# Patient Record
Sex: Female | Born: 1965
Health system: Southern US, Community
[De-identification: ages and names within clinical notes are randomized; demographics above are authoritative.]

## PROBLEM LIST (undated history)

## (undated) DIAGNOSIS — Z794 Long term (current) use of insulin: Secondary | ICD-10-CM

## (undated) DIAGNOSIS — K589 Irritable bowel syndrome without diarrhea: Secondary | ICD-10-CM

## (undated) DIAGNOSIS — E119 Type 2 diabetes mellitus without complications: Secondary | ICD-10-CM

## (undated) DIAGNOSIS — K3580 Unspecified acute appendicitis: Secondary | ICD-10-CM

## (undated) DIAGNOSIS — M5030 Other cervical disc degeneration, unspecified cervical region: Secondary | ICD-10-CM

## (undated) DIAGNOSIS — M549 Dorsalgia, unspecified: Secondary | ICD-10-CM

## (undated) DIAGNOSIS — E079 Disorder of thyroid, unspecified: Secondary | ICD-10-CM

## (undated) DIAGNOSIS — E785 Hyperlipidemia, unspecified: Secondary | ICD-10-CM

## (undated) DIAGNOSIS — Z8719 Personal history of other diseases of the digestive system: Secondary | ICD-10-CM

## (undated) DIAGNOSIS — K219 Gastro-esophageal reflux disease without esophagitis: Secondary | ICD-10-CM

## (undated) DIAGNOSIS — I10 Essential (primary) hypertension: Secondary | ICD-10-CM

## (undated) DIAGNOSIS — G4730 Sleep apnea, unspecified: Secondary | ICD-10-CM

## (undated) DIAGNOSIS — G8929 Other chronic pain: Secondary | ICD-10-CM

## (undated) DIAGNOSIS — E0590 Thyrotoxicosis, unspecified without thyrotoxic crisis or storm: Secondary | ICD-10-CM

## (undated) DIAGNOSIS — F419 Anxiety disorder, unspecified: Secondary | ICD-10-CM

## (undated) DIAGNOSIS — D649 Anemia, unspecified: Secondary | ICD-10-CM

## (undated) DIAGNOSIS — Z6841 Body Mass Index (BMI) 40.0 and over, adult: Secondary | ICD-10-CM

## (undated) DIAGNOSIS — M1990 Unspecified osteoarthritis, unspecified site: Secondary | ICD-10-CM

## (undated) HISTORY — PX: VITRECTOMY: SHX106

## (undated) HISTORY — DX: Disorder of thyroid, unspecified: E07.9

## (undated) HISTORY — DX: Anxiety disorder, unspecified: F41.9

## (undated) HISTORY — DX: Thyrotoxicosis, unspecified without thyrotoxic crisis or storm: E05.90

## (undated) HISTORY — DX: Irritable bowel syndrome, unspecified: K58.9

## (undated) HISTORY — DX: Unspecified osteoarthritis, unspecified site: M19.90

## (undated) HISTORY — DX: Hyperlipidemia, unspecified: E78.5

## (undated) HISTORY — PX: TUBAL LIGATION: SHX77

---

## 1999-02-17 ENCOUNTER — Other Ambulatory Visit: Admission: RE | Admit: 1999-02-17 | Discharge: 1999-02-17 | Payer: Self-pay | Admitting: Obstetrics and Gynecology

## 1999-02-17 ENCOUNTER — Encounter (INDEPENDENT_AMBULATORY_CARE_PROVIDER_SITE_OTHER): Payer: Self-pay | Admitting: Specialist

## 2000-08-06 ENCOUNTER — Encounter: Payer: Self-pay | Admitting: Emergency Medicine

## 2000-08-06 ENCOUNTER — Emergency Department (HOSPITAL_COMMUNITY): Admission: EM | Admit: 2000-08-06 | Discharge: 2000-08-06 | Payer: Self-pay

## 2000-09-14 ENCOUNTER — Other Ambulatory Visit: Admission: RE | Admit: 2000-09-14 | Discharge: 2000-09-14 | Payer: Self-pay | Admitting: Obstetrics and Gynecology

## 2000-12-28 ENCOUNTER — Encounter: Admission: RE | Admit: 2000-12-28 | Discharge: 2001-03-28 | Payer: Self-pay | Admitting: Internal Medicine

## 2001-09-28 ENCOUNTER — Encounter: Admission: RE | Admit: 2001-09-28 | Discharge: 2001-12-27 | Payer: Self-pay | Admitting: Internal Medicine

## 2003-05-17 ENCOUNTER — Other Ambulatory Visit: Admission: RE | Admit: 2003-05-17 | Discharge: 2003-05-17 | Payer: Self-pay | Admitting: Obstetrics and Gynecology

## 2004-08-26 ENCOUNTER — Other Ambulatory Visit: Admission: RE | Admit: 2004-08-26 | Discharge: 2004-08-26 | Payer: Self-pay | Admitting: Obstetrics and Gynecology

## 2005-07-14 ENCOUNTER — Encounter: Admission: RE | Admit: 2005-07-14 | Discharge: 2005-10-12 | Payer: Self-pay | Admitting: Internal Medicine

## 2006-03-24 ENCOUNTER — Encounter: Admission: RE | Admit: 2006-03-24 | Discharge: 2006-03-24 | Payer: Self-pay | Admitting: Endocrinology

## 2006-04-06 ENCOUNTER — Encounter: Admission: RE | Admit: 2006-04-06 | Discharge: 2006-04-06 | Payer: Self-pay | Admitting: Endocrinology

## 2006-04-14 ENCOUNTER — Encounter (INDEPENDENT_AMBULATORY_CARE_PROVIDER_SITE_OTHER): Payer: Self-pay | Admitting: Specialist

## 2006-04-14 ENCOUNTER — Other Ambulatory Visit: Admission: RE | Admit: 2006-04-14 | Discharge: 2006-04-14 | Payer: Self-pay | Admitting: Interventional Radiology

## 2006-04-14 ENCOUNTER — Encounter: Admission: RE | Admit: 2006-04-14 | Discharge: 2006-04-14 | Payer: Self-pay | Admitting: Endocrinology

## 2007-03-09 ENCOUNTER — Ambulatory Visit (HOSPITAL_COMMUNITY): Admission: RE | Admit: 2007-03-09 | Discharge: 2007-03-09 | Payer: Self-pay | Admitting: Gastroenterology

## 2007-03-16 ENCOUNTER — Encounter: Admission: RE | Admit: 2007-03-16 | Discharge: 2007-03-16 | Payer: Self-pay | Admitting: Gastroenterology

## 2007-08-10 ENCOUNTER — Encounter: Admission: RE | Admit: 2007-08-10 | Discharge: 2007-08-10 | Payer: Self-pay | Admitting: Gastroenterology

## 2007-08-24 ENCOUNTER — Encounter: Payer: Self-pay | Admitting: Gastroenterology

## 2007-08-24 ENCOUNTER — Ambulatory Visit (HOSPITAL_COMMUNITY): Admission: RE | Admit: 2007-08-24 | Discharge: 2007-08-24 | Payer: Self-pay | Admitting: Gastroenterology

## 2007-08-30 ENCOUNTER — Ambulatory Visit: Payer: Self-pay | Admitting: Thoracic Surgery

## 2007-08-31 ENCOUNTER — Ambulatory Visit: Payer: Self-pay | Admitting: Gastroenterology

## 2007-10-05 ENCOUNTER — Encounter: Admission: RE | Admit: 2007-10-05 | Discharge: 2007-10-05 | Payer: Self-pay | Admitting: Endocrinology

## 2008-02-26 ENCOUNTER — Encounter: Payer: Self-pay | Admitting: Gastroenterology

## 2008-02-26 DIAGNOSIS — D379 Neoplasm of uncertain behavior of digestive organ, unspecified: Secondary | ICD-10-CM | POA: Insufficient documentation

## 2008-03-13 ENCOUNTER — Telehealth: Payer: Self-pay | Admitting: Gastroenterology

## 2008-03-21 ENCOUNTER — Ambulatory Visit: Payer: Self-pay | Admitting: Gastroenterology

## 2008-03-21 ENCOUNTER — Ambulatory Visit (HOSPITAL_COMMUNITY): Admission: RE | Admit: 2008-03-21 | Discharge: 2008-03-21 | Payer: Self-pay | Admitting: Gastroenterology

## 2008-03-21 ENCOUNTER — Encounter: Payer: Self-pay | Admitting: Gastroenterology

## 2008-03-25 ENCOUNTER — Encounter: Payer: Self-pay | Admitting: Gastroenterology

## 2008-06-14 ENCOUNTER — Encounter: Admission: RE | Admit: 2008-06-14 | Discharge: 2008-06-14 | Payer: Self-pay | Admitting: Endocrinology

## 2009-01-28 IMAGING — CT CT NECK W/ CM
3 series · 16 of 33 positions shown, 19 images · IV contrast ([ID] [id number])
Comparison: none

CLINICAL DATA: Follow-up substernal goiter/mediastinal mass.

CT NECK WITH CONTRAST
TECHNIQUE: Multidetector CT imaging of the neck was performed with
intravenous contrast.
Contrast: 100 ml Zmnipaque-XHH intravenous.
ultrasound 03/24/2006, nuclear medicine thyroid scans 04/07/2006
and 10/06/2007, and CT chest 08/10/2007 and CT chest 06/14/2008.

[Series 103: chest and neck · axial · 0.40mm/px · z∈[-266,-86]mm · 8 of 58 slices shown, 10 images]
[im 5/58  soft-tissue]
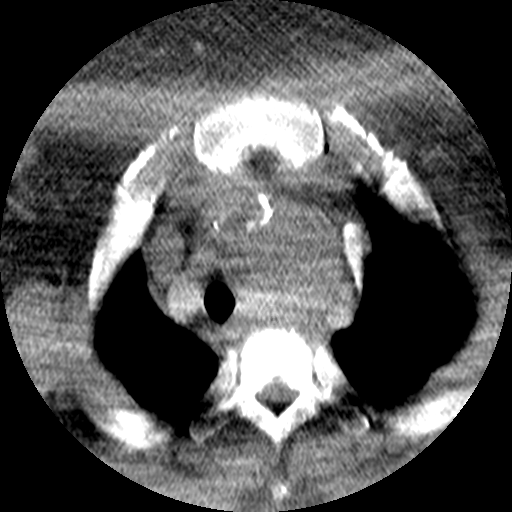
[im 5/58  bone]
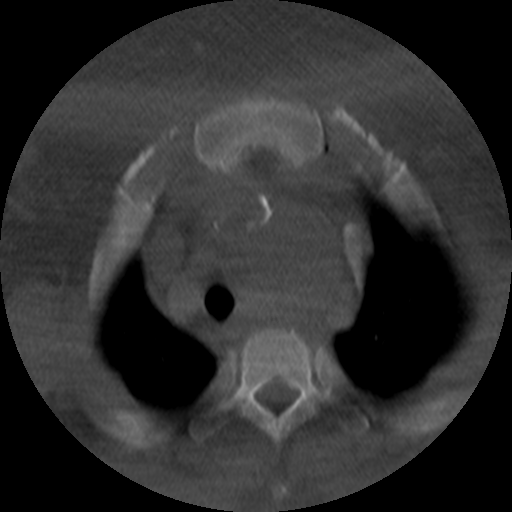
[im 14/58  bone]
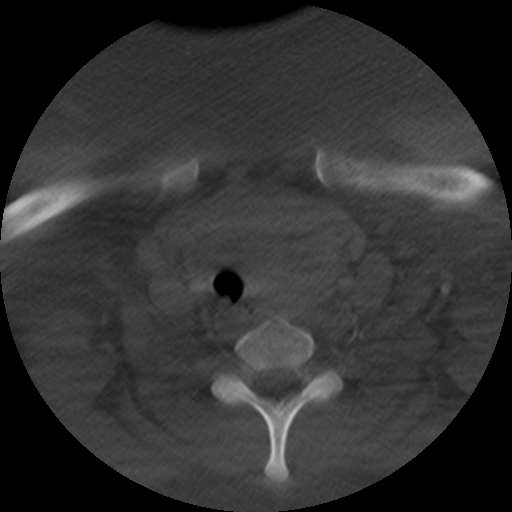
[im 18/58  bone]
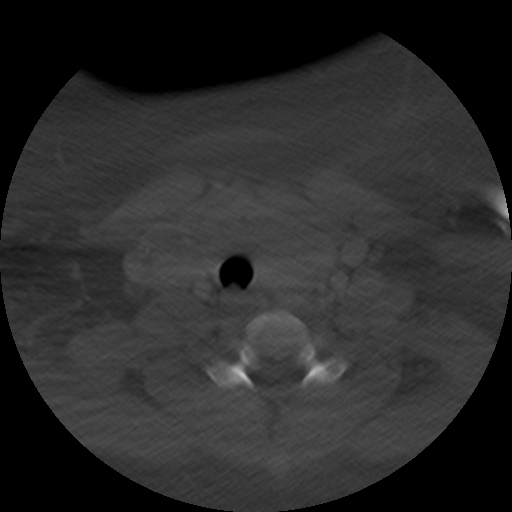
[im 27/58  bone]
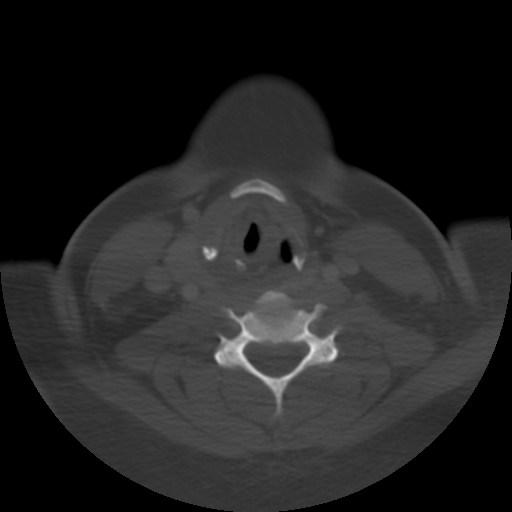
[im 31/58  soft-tissue]
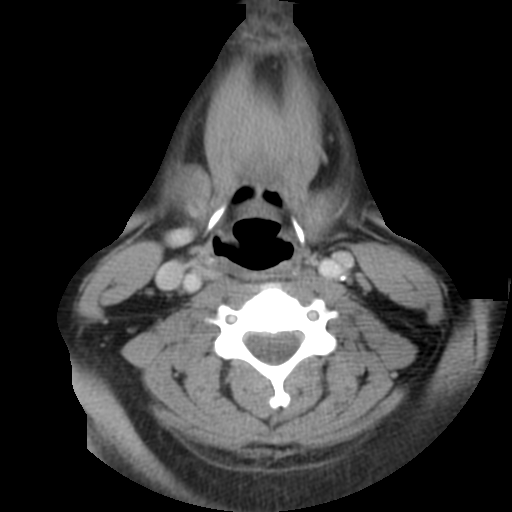
[im 31/58  bone]
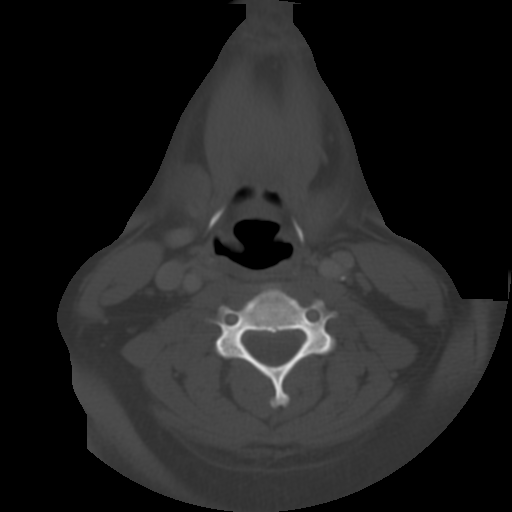
[im 40/58  bone]
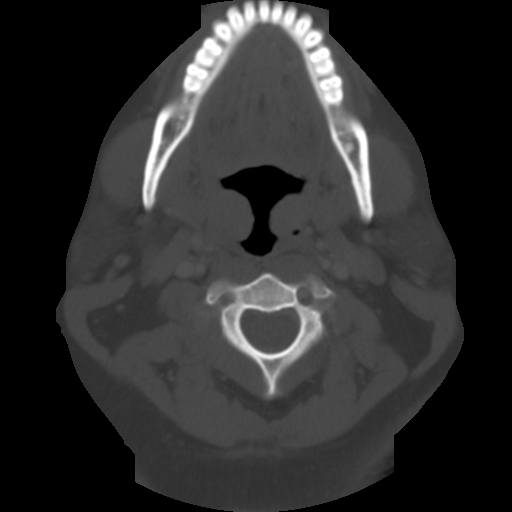
[im 44/58  bone]
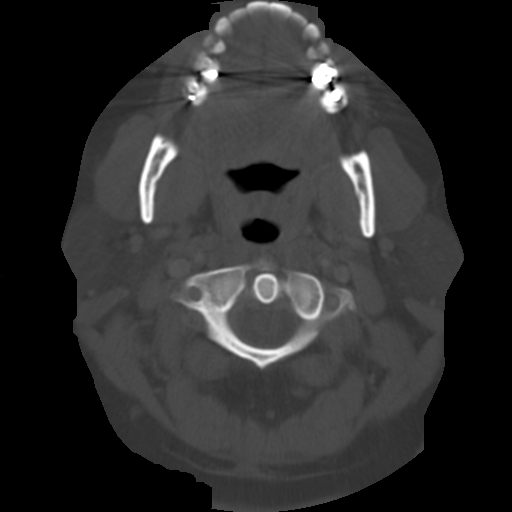
[im 53/58  bone]
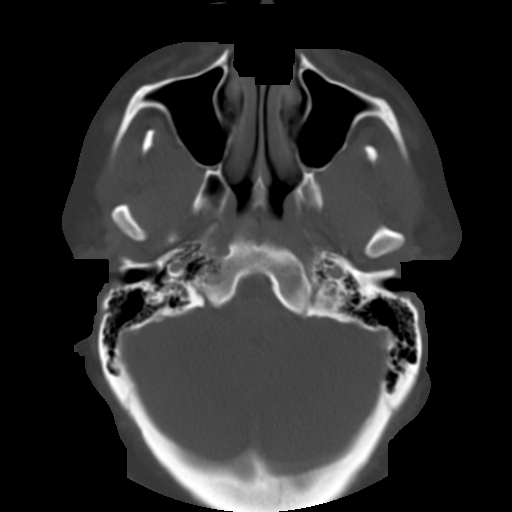

[Series 104: cor neck · coronal · 0.43mm/px · 3 of 105 slices shown]
[im 21/105  bone]
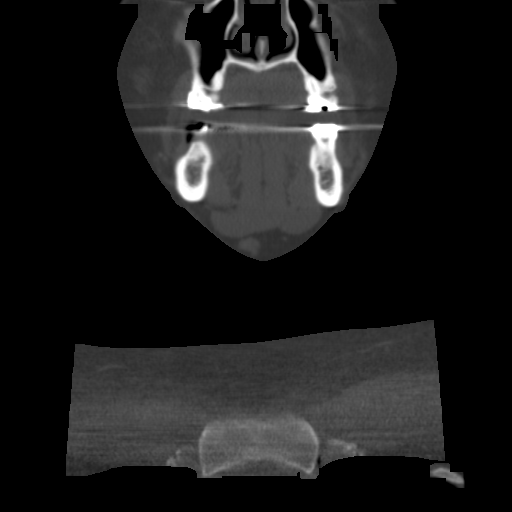
[im 42/105  bone]
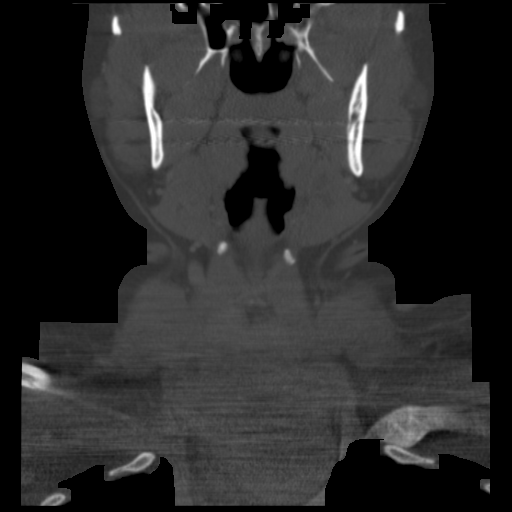
[im 63/105  bone]
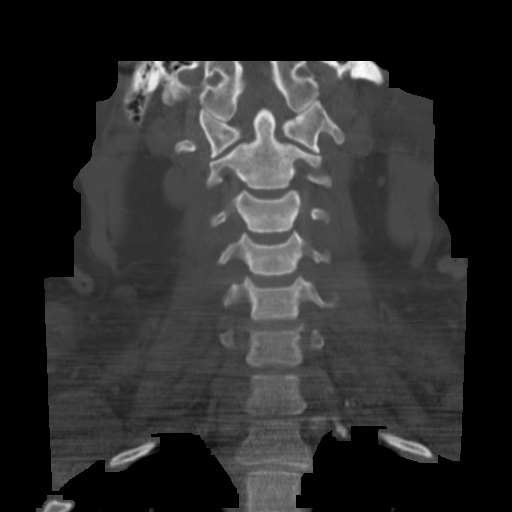

[Series 105: sag neck · sagittal · 0.43mm/px · 5 of 94 slices shown, 6 images]
[im 32/94  bone]
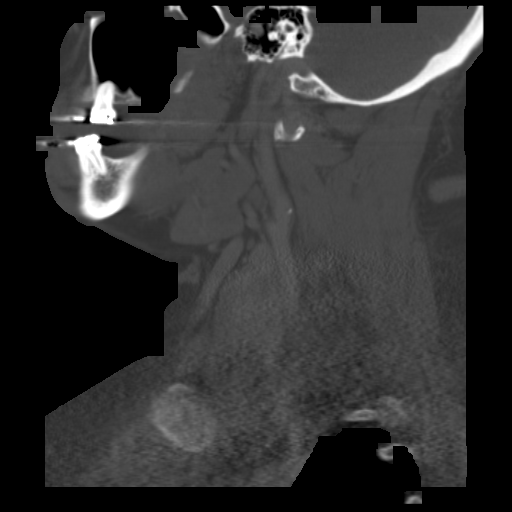
[im 39/94  bone]
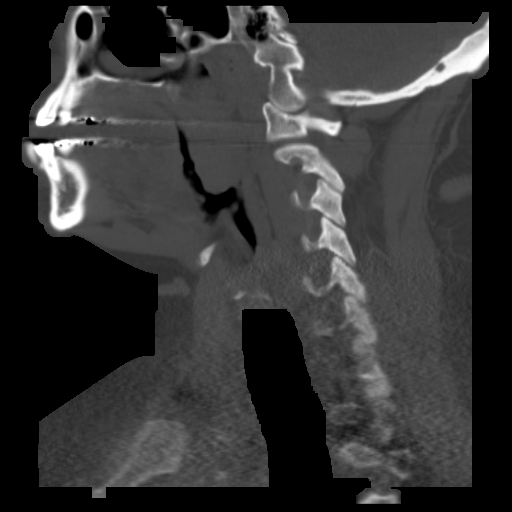
[im 47/94  soft-tissue]
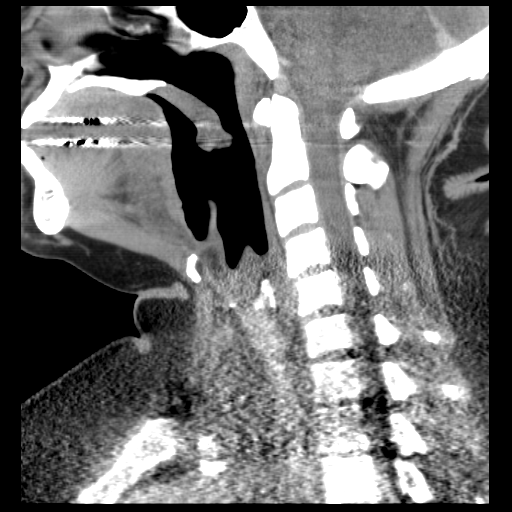
[im 47/94  bone]
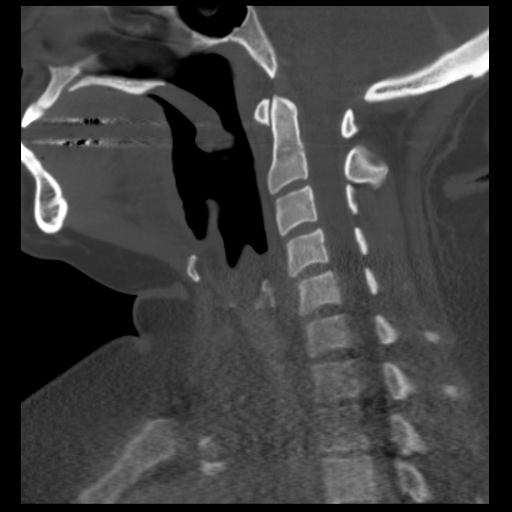
[im 55/94  bone]
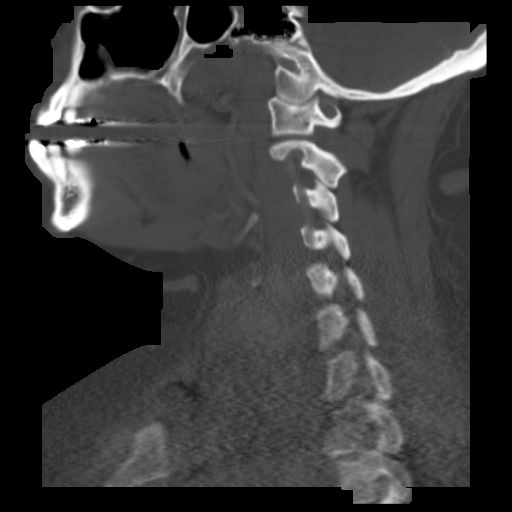
[im 63/94  bone]
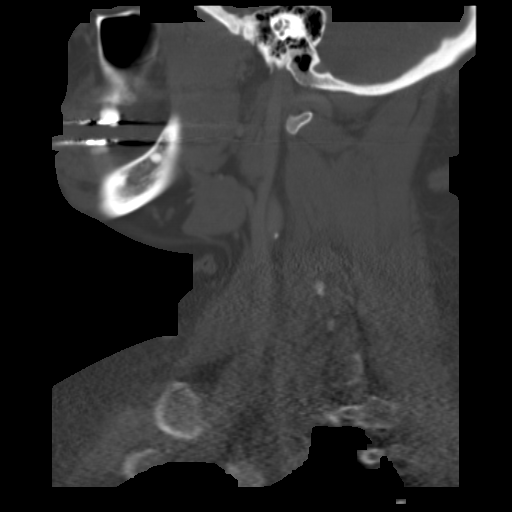

[16 of 33 positions shown; findings below may reference images not displayed]

FINDINGS: Again seen is a (left greater than right) generalized
thyroid enlargement with deviation of trachea to the right of
midline.  Complete substernal measurement of the thyroid included
on the CT chest 06/14/08 is reported separately.  Right lobe thyroid
currently measures 3.1 cm AP X 3.9 cm wide (axial image 40)
(ultrasound 03/24/2006 3.6 X 4.5 cm).  Left lobe submanubrial
thyroid currently measures 5.2 cm AP X 4.9 cm wide (axial image 51)
(chest CT 08/10/2007 at similar level 5.0 X 4.7 cm).  Since prior
CT no change in calcifications at the right lobe and submanubrial
thyroid.  No other new discrete nodule or mass is seen by CT
assessment.  No direct invasion of adjacent structures visualized.
Nonspecific slight bilateral level II jugulodigastric adenopathy
with short axis measuring 11 mm on the right and 10 mm on the left
(axial image 21).  No other cervical adenopathy visualized.
Minimal calcification is seen at the distal left common carotid
artery.  Remainder of study is unremarkable.
IMPRESSION: 1.  Essentially stable (left greater than right) marked thyroid
enlargement with submanubrial extension favoring a large benign
thyroid goiter.  Recommend follow up imaging and 6-12 months as
clinically indicated.
2.  Slight nonspecific bilateral level II and jugulodigastric
adenopathy. Recommend clinical and/or imaging follow-up in 3-6
months.
3.  Otherwise, no additional acute or metastatic disease
visualized.

## 2009-02-26 ENCOUNTER — Encounter: Payer: Self-pay | Admitting: Gastroenterology

## 2009-02-26 ENCOUNTER — Telehealth (INDEPENDENT_AMBULATORY_CARE_PROVIDER_SITE_OTHER): Payer: Self-pay | Admitting: *Deleted

## 2009-02-26 DIAGNOSIS — R933 Abnormal findings on diagnostic imaging of other parts of digestive tract: Secondary | ICD-10-CM

## 2010-02-20 ENCOUNTER — Ambulatory Visit: Payer: Self-pay | Admitting: Family Medicine

## 2010-03-06 ENCOUNTER — Ambulatory Visit: Payer: Self-pay | Admitting: Family Medicine

## 2010-06-27 ENCOUNTER — Encounter: Payer: Self-pay | Admitting: Family Medicine

## 2010-06-27 ENCOUNTER — Ambulatory Visit: Payer: Self-pay | Admitting: Family Medicine

## 2010-06-27 DIAGNOSIS — I10 Essential (primary) hypertension: Secondary | ICD-10-CM | POA: Insufficient documentation

## 2010-06-27 DIAGNOSIS — E1165 Type 2 diabetes mellitus with hyperglycemia: Secondary | ICD-10-CM

## 2010-06-27 DIAGNOSIS — E119 Type 2 diabetes mellitus without complications: Secondary | ICD-10-CM | POA: Insufficient documentation

## 2010-06-27 DIAGNOSIS — Z9119 Patient's noncompliance with other medical treatment and regimen: Secondary | ICD-10-CM

## 2010-06-27 DIAGNOSIS — E0590 Thyrotoxicosis, unspecified without thyrotoxic crisis or storm: Secondary | ICD-10-CM

## 2010-06-27 DIAGNOSIS — E785 Hyperlipidemia, unspecified: Secondary | ICD-10-CM | POA: Insufficient documentation

## 2010-06-27 LAB — CONVERTED CEMR LAB
Bilirubin Urine: NEGATIVE
Glucose, Bld: 162 mg/dL
Specific Gravity, Urine: 1.025
WBC Urine, dipstick: NEGATIVE
pH: 5

## 2010-06-28 ENCOUNTER — Encounter: Payer: Self-pay | Admitting: Family Medicine

## 2010-06-29 ENCOUNTER — Encounter (INDEPENDENT_AMBULATORY_CARE_PROVIDER_SITE_OTHER): Payer: Self-pay

## 2010-06-29 LAB — CONVERTED CEMR LAB
ALT: 8 U/L
AST: 9 U/L
Albumin: 3.9 g/dL
Alkaline Phosphatase: 69 U/L
BUN: 14 mg/dL
Basophils Absolute: 0.1 K/uL
Basophils Relative: 1 %
CO2: 27 meq/L
Calcium: 9.2 mg/dL
Chloride: 103 meq/L
Creatinine, Ser: 0.73 mg/dL
Eosinophils Absolute: 0.1 K/uL
Eosinophils Relative: 2 %
Glucose, Bld: 134 mg/dL — ABNORMAL HIGH
HCT: 34.7 % — ABNORMAL LOW
Hemoglobin: 10.8 g/dL — ABNORMAL LOW
Lymphocytes Relative: 25 %
Lymphs Abs: 1.7 K/uL
MCHC: 31.1 g/dL
MCV: 80.3 fL
Monocytes Absolute: 0.5 K/uL
Monocytes Relative: 7 %
Neutro Abs: 4.4 K/uL
Neutrophils Relative %: 65 %
Platelets: 394 K/uL
Potassium: 4.6 meq/L
RBC: 4.32 M/uL
RDW: 14.8 %
Sodium: 140 meq/L
TSH: 0.454 u[IU]/mL
Total Bilirubin: 0.3 mg/dL
Total Protein: 6.8 g/dL
WBC: 6.7 10*3/microliter

## 2010-06-30 ENCOUNTER — Encounter: Payer: Self-pay | Admitting: Family Medicine

## 2010-07-07 ENCOUNTER — Telehealth (INDEPENDENT_AMBULATORY_CARE_PROVIDER_SITE_OTHER): Payer: Self-pay | Admitting: Internal Medicine

## 2010-07-12 ENCOUNTER — Encounter: Payer: Self-pay | Admitting: Family Medicine

## 2010-08-02 ENCOUNTER — Encounter: Payer: Self-pay | Admitting: Endocrinology

## 2010-08-13 NOTE — Letter (Signed)
Summary: Out of Work  [facility]  [address]   [place], [place] [address]   Phone: [phone]  Fax: [phone]    June 27, 2010   Employee:  CHERITA HEBEL    To Whom It May Concern:   For Medical reasons, please excuse the above named employee from work for the following dates:  Start:   June 07, 2010  End:   June 25, 2010  If you need additional information, please feel free to contact our office.         Sincerely,    Standley Dakins MD

## 2010-08-13 NOTE — Miscellaneous (Signed)
Summary: Ortho Specialist Appt  Clinical Lists Changes   Pt. was notified of her appt on 06-30-2010 at 2:00 at [facility]. Pt. appt is with Dr. Otelia Sergeant.

## 2010-08-13 NOTE — Progress Notes (Signed)
Summary: Office Visit  Office Visit   Imported By: Dorna Leitz 07/12/2010 17:05:54  _____________________________________________________________________  External Attachment:    Type:   Image     Comment:   External Document

## 2010-08-13 NOTE — Miscellaneous (Signed)
Summary: Ortho Appointment Confirmation  ---- Converted from flag ---- ---- 06/29/2010 10:33 AM, Levonne Spiller EMT-P wrote: Pt. has an appt. with Dr. Otelia Sergeant at [facility] in [place] on 06-30-2010 at 2:00. Pt. was notified.  ---- 06/27/2010 5:34 PM, Standley Dakins MD wrote: Please refer patient to a back specialist in [place] regarding Chronic Lower back pain and disability in next 1-3 weeks.  Orthopedic back specialist is OK. Evaluate and Treat.  Please notify patient of the appointment information.  CLJMD ------------------------------

## 2010-08-13 NOTE — Progress Notes (Signed)
Summary: CYTOPATHOLOGY  CYTOPATHOLOGY   Imported By: Dorna Leitz 07/12/2010 17:04:49  _____________________________________________________________________  External Attachment:    Type:   Image     Comment:   External Document

## 2010-08-13 NOTE — Letter (Signed)
Summary: work excuse  work excuse   Imported By: Rosine Beat 06/28/2010 15:31:21  _____________________________________________________________________  External Attachment:    Type:   Image     Comment:   External Document

## 2010-08-13 NOTE — Assessment & Plan Note (Signed)
Summary: DIABETES CHECK UP/JBB   Vital Signs:  Patient Profile:   45 Years Old Female CC:      diabetes and low back pain Height:     70 inches Weight:      341 pounds O2 Sat:      99 % O2 treatment:    Room Air Temp:     98.9 degrees F oral Pulse rate:   77 / minute Pulse rhythm:   regular Resp:     20 per minute BP sitting:   148 / 79  (left arm) Cuff size:   large  Vitals Entered By: Providence Crosby LPN (June 27, 2010 1:07 PM)                  Current Allergies: No known allergies History of Present Illness History from: patient Chief Complaint: diabetes and low back pain History of Present Illness: low back pain ? UTI or yeast per patient.  The patient came in today with her sister and reports that she has been out of work since 11/27 and has been having problems with lower back pain.  She is having pain in the lumbar spine that does not radiate but stays in the center of the lumber spine/tailbone.  She says that she has been depressed as well.  She has been irritable. She stopped taking her wellbutrin last year and would like to restart it because it did help with her symptoms but she never took it as prescribed.  She says that she has not been taking her insulin as prescribed and reports that she is having a difficult time remembering to take the evening dose of insulin and she says that she forgets it often.  She is not testing her blood sugars regularly.  She says that she is taking her medications for cholesterol.    REVIEW OF SYSTEMS Constitutional Symptoms       Complains of weight gain and fatigue.     Denies fever, chills, night sweats, and weight loss.  Eyes       Complains of change in vision.      Denies eye pain, eye discharge, glasses, contact lenses, and eye surgery. Ear/Nose/Throat/Mouth       Complains of ear pain, dizziness, and sinus problems.      Denies hearing loss/aids, change in hearing, ear discharge, frequent runny nose, frequent nose bleeds, sore  throat, hoarseness, and tooth pain or bleeding.  Respiratory       Complains of wheezing and shortness of breath.      Denies dry cough, productive cough, asthma, bronchitis, and emphysema/COPD.  Cardiovascular       Denies murmurs, chest pain, and tires easily with exhertion.    Gastrointestinal       Complains of stomach pain, nausea/vomiting, diarrhea, constipation, and indigestion.      Denies blood in bowel movements. Genitourniary       Denies painful urination, kidney stones, and loss of urinary control. Neurological       Complains of headaches, numbness, and tingling.      Denies paralysis, seizures, and fainting/blackouts. Musculoskeletal       Complains of muscle pain and joint pain.      Denies joint stiffness, decreased range of motion, redness, swelling, muscle weakness, and gout.  Skin       Denies bruising, unusual mles/lumps or sores, and hair/skin or nail changes.  Psych       Complains of mood changes, temper/anger issues, anxiety/stress,  depression, and sleep problems.      Denies speech problems.  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 Hypertension Depression/Anxiety Chronic LBP Hyperlipidemia  Past Surgical History: Caesarean section11/03/1989 and 08/06/1992  Family History: Father: deceased heart problems  Mother: Deceased thyroid and breast cancer Siblings:  1 Sister alive and well  Social History: Divorced 2 children daughter 69 yoa and 1 son 43 yoa Physical Exam General appearance: well developed, well nourished, no acute distress Head: normocephalic, atraumatic Eyes: conjunctivae and lids normal Pupils: equal, round, reactive to light Ears:  normal, no lesions or deformities Nasal: pale, boggy, swollen nasal turbinates Oral/Pharynx: dry MM, tongue normal, posterior pharynx without erythema or exudate Neck: neck supple,  trachea midline, no masses Thyroid: diffuse enlargement Chest/Lungs: no rales, wheezes, or rhonchi bilateral, breath sounds equal without effort Heart: regular rate and  rhythm, no murmur Abdomen: soft, non-tender without obvious organomegaly Extremities: normal extremities Neurological: grossly intact and non-focal Back: tender musculature center lumbar ower back, deep tendon reflexes 2+ at patella Skin: no obvious rashes or lesions MSE: oriented to time, place, and person Assessment Problems:   DIABETES MELLITUS, TYPE II, UNCONTROLLED (ICD-250.02) UNSPECIFIED ESSENTIAL HYPERTENSION (ICD-401.9) Additional workup planned New Problems: DIABETES MELLITUS, TYPE II, UNCONTROLLED (ICD-250.02) CANDIDIASIS, VAGINAL (ICD-112.1) ? of UTI (ICD-599.0) UNSPECIFIED ESSENTIAL HYPERTENSION (ICD-401.9) MORBID OBESITY (ICD-278.01) PERS HX NONCOMPLIANCE W/MED TX PRS HAZARDS HLTH (ICD-V15.81) DYSLIPIDEMIA (ICD-272.4) HYPERTHYROIDISM (ICD-242.90)  I spent more than 60 mins with patient today discussing her depression and weight gain and chronic lower back pain that is causing her to miss work.  She is having trouble coping with her job duties and responsibilities.  She is not taking care of her health and we discussed this with her in detail today.  She is at high risk for acute and chronic complications of uncontrolled type 2 DM.   Patient Education: The risks, benefits and possible side effects were clearly explained and discussed with the patient.  The patient verbalized clear understanding.  The patient was given instructions to return if symptoms don't improve, worsen or new changes develop.  If it is not during clinic hours and the patient cannot get back to this clinic then the patient was told to seek medical care at  an available urgent care or emergency department.  The patient verbalized understanding.   Demonstrates willingness to comply.  Plan New Medications/Changes: FLUCONAZOLE 150 MG TABS (FLUCONAZOLE) take 1 by mouth x 1 for yeast infection  #1 x 0, 2010/07/08, Clanford Johnson MD BUPROPION HCL 300 MG XR24H-TAB (BUPROPION HCL) take 1 by mouth every morning  #30 x 3, 07-08-10, Clanford Johnson MD BUPROPION HCL 150 MG XR24H-TAB (BUPROPION HCL) take 1 by mouth every morning for 4 days  #4 x 0, July 08, 2010, Clanford Johnson MD METFORMIN HCL 500 MG (OSM) XR24H-TAB (METFORMIN HCL) 2 tablets by mouth two times a day with meals  #120 x 3, 07-08-2010, Clanford Johnson MD  Planning Comments:   Today we discussed starting back on bupropion to treat the depression/anxiety.  We discussed in detail the importance of taking her insulin and testing the blood glucose.  We disscussed close follow up in 6 weeks for recheck.  We also discussed getting patient seen by a  back specialist for evaluation of chronic Low back pain causing disability.   A urine culture has been ordered for the abnormal UA.   Will call in antibiotics as needed.  Treating the yeast infection with fluconazole by mouth.  Follow Up: 6 weeks for recheck.   The patient and/or caregiver has been counseled thoroughly with regard to medications prescribed including dosage, schedule, interactions, rationale for use, and possible side effects and they verbalize understanding.  Diagnoses and expected course of recovery discussed and will return if not improved as expected or if the condition worsens. Patient and/or caregiver verbalized understanding.  Prescriptions: FLUCONAZOLE 150 MG TABS (FLUCONAZOLE) take 1 by mouth x 1 for yeast infection  #1 x 0   Entered and Authorized by:   Standley Dakins MD   Signed by:   Standley Dakins MD on 06/27/2010   Method used:   Electronically to        [facility]* (retail)       [address] [place], [place]  [address]       Ph: [phone]       Fax: [phone]   RxID:   [id number] BUPROPION HCL 300 MG XR24H-TAB (BUPROPION HCL) take 1 by mouth every morning  #30 x 3   Entered and Authorized by:   Standley Dakins MD   Signed by:   Standley Dakins MD on 06/27/2010   Method used:   Electronically to        [facility]* (retail)       [address] [place], [place]  [address]       Ph: [phone]       Fax: [phone]   RxID:   [id number] BUPROPION HCL 150 MG XR24H-TAB (BUPROPION HCL) take 1 by mouth every morning for 4 days  #4 x 0   Entered and Authorized by:   Standley Dakins MD   Signed by:   Standley Dakins MD on 06/27/2010   Method used:   Electronically to        [facility]* (retail)       [address] [place], [place]  [address]       Ph: [phone]       Fax: [phone]   RxID:   [id number] METFORMIN HCL 500 MG (OSM) XR24H-TAB (METFORMIN HCL) 2 tablets by mouth two times a day with meals  #120 x 3   Entered and Authorized by:   Standley Dakins MD   Signed by:   Standley Dakins MD on 06/27/2010   Method used:   Electronically to        [facility]* (retail)       [address] [place], [place]  [address]       Ph: [phone]       Fax: [phone]   RxID:   [id number]   Patient Instructions: 1)  We will be calling you about the referral to a back specialist about your chronic Low back pain.  2)  Check your blood sugars regularly. If your readings are usually above 200 or below 70 you should contact our office. 3)  It is important that  your Diabetic A1c level is checked every 3 months. 4)  See your eye doctor yearly to check for diabetic eye damage. 5)  Check your feet each night for sore areas, calluses or signs of infection. 6)  Check your Blood Pressure regularly. If it is above:140/90  you should make  an appointment. 7)  Please take your medications and take care of yourself.  Please come back in 6 weeks for a recheck.  8)  Go to [email or web address] for assistance finding a therapist to talk to about your depression and issues.   9)  The patient was informed that there is no on-call provider or services available at this clinic during off-hours (when the clinic is closed).  If the patient developed a problem or concern that required immediate attention, the patient was advised to go the the nearest available urgent care or emergency department for medical care.  The patient verbalized understanding.        Lab Results Urinalysis:      Color:     yellow    Leuk:     negative    Nitr:     positive    Urobil:     0.2    Prot:     negative    pH:     5.0    Blood:     trace-intact    Sp. Gr:     1.025    Ket:     negative    Bili:     negative    Glu:     negative    Laboratory Results   Urine Tests  Date/Time Received: June 27, 2010 1:28 PM Date/Time Reported: June 27, 2010 1:28 PM  Routine Urinalysis   Color: yellow Glucose: negative   (Normal Range: Negative) Bilirubin: negative   (Normal Range: Negative) Ketone: negative   (Normal Range: Negative) Spec. Gravity: 1.025   (Normal Range: 1.003-1.035) Blood: trace-intact   (Normal Range: Negative) pH: 5.0   (Normal Range: 5.0-8.0) Protein: negative   (Normal Range: Negative) Urobilinogen: 0.2   (Normal Range: 0-1) Nitrite: positive   (Normal Range: Negative) Leukocyte Esterace: negative   (Normal Range: Negative)     Blood Tests   Date/Time Received: June 27, 2010 2:33 PM Date/Time Reported: June 27, 2010 2:33 PM  Glucose (random): 162 mg/dL   (Normal Range: 04-540)        Pending Immunizations Influenza  Immunizations given and recorded during this visit INFLUENZA deferred due to patient condition  JWJ:[id number]  LotNo:[id number]  ExpDt:12/2010  IM  left deltoid  by Providence Crosby  LPN  MVH:84/69/6295   The influenze vaccine was given today. CLJMD Laboratory Results   Urine Tests    Routine Urinalysis   Color: yellow Glucose: negative   (Normal Range: Negative) Bilirubin: negative   (Normal Range: Negative) Ketone: negative   (Normal Range: Negative) Spec. Gravity: 1.025   (Normal Range: 1.003-1.035) Blood: trace-intact   (Normal Range: Negative) pH: 5.0   (Normal Range: 5.0-8.0) Protein: negative   (Normal Range: Negative) Urobilinogen: 0.2   (Normal Range: 0-1) Nitrite: positive   (Normal Range: Negative) Leukocyte Esterace: negative   (Normal Range: Negative)     Blood Tests     Glucose (random): 162 mg/dL   (Normal Range: 28-413)        Urine Culture Pending.

## 2010-08-13 NOTE — Progress Notes (Signed)
  Phone Note Call from Patient   Caller: Patient Call For: Dr. Naaman Plummer Reason for Call: Refill Medication Summary of Call: Pt. requesting a refill for Diflucan. She thinks she might be getting a yeast infection due to the antibiotic she is currently taking. Initial call taken by: Levonne Spiller EMT-P,  July 07, 2010 11:35 AM    New/Updated Medications: FLUCONAZOLE 150 MG TABS (FLUCONAZOLE) take po Prescriptions: FLUCONAZOLE 150 MG TABS (FLUCONAZOLE) take po  #1 x 0   Entered and Authorized by:   J. Juline Patch MD   Signed by:   Shela Commons. Juline Patch MD on 07/07/2010   Method used:   Electronically to        [facility]* (retail)       [address] [place], [place]  [address]       Ph: [phone]       Fax: [phone]   RxID:   [id number]

## 2010-08-14 ENCOUNTER — Ambulatory Visit: Admit: 2010-08-14 | Payer: Self-pay | Admitting: Family Medicine

## 2010-08-22 ENCOUNTER — Encounter: Payer: Self-pay | Admitting: Family Medicine

## 2010-08-22 ENCOUNTER — Ambulatory Visit: Payer: Federal, State, Local not specified - PPO | Admitting: Family Medicine

## 2010-08-22 DIAGNOSIS — I10 Essential (primary) hypertension: Secondary | ICD-10-CM

## 2010-08-22 DIAGNOSIS — K219 Gastro-esophageal reflux disease without esophagitis: Secondary | ICD-10-CM

## 2010-08-22 DIAGNOSIS — E162 Hypoglycemia, unspecified: Secondary | ICD-10-CM | POA: Insufficient documentation

## 2010-08-22 DIAGNOSIS — J309 Allergic rhinitis, unspecified: Secondary | ICD-10-CM | POA: Insufficient documentation

## 2010-08-22 DIAGNOSIS — E1165 Type 2 diabetes mellitus with hyperglycemia: Secondary | ICD-10-CM

## 2010-08-27 NOTE — Letter (Signed)
Summary: Out of Work  [facility]  [address]   [place], [place] [address]   Phone: [phone]  Fax: [phone]    August 22, 2010   Employee:  Kristina Adams    To Whom It May Concern:   For Medical reasons, please excuse the above named employee from work for the following dates:  Start:   August 22, 2010  End:   August 24, 2010  If you need additional information, please feel free to contact our office.         Sincerely,    Standley Dakins MD

## 2010-08-27 NOTE — Assessment & Plan Note (Signed)
Summary: SICK/EVM   Vital Signs:  Patient profile:   45 year old female Height:      70 inches Weight:      338 pounds BMI:     48.67 O2 Sat:      98 % on Room air Temp:     97.4 degrees F Pulse rate:   87 / minute BP sitting:   165 / 81  (left arm) Cuff size:   large  Vitals Entered By: Haze Boyden, CMA (August 22, 2010 4:30 PM)  O2 Flow:  Room air History of Present Illness History from: patient Reason for visit: follow up of multiple problems Chief Complaint: stuffy head, eyes are watery, dizziness, nausea, sugars are up History of Present Illness: The patient is presenting today with complaints of allergy symptoms. She is having runny nose, nasal congestion, postnasal drainage, dry cough and nausea.  She is having severe acid reflux symptoms because she is without her medications.  She had been taking omeprazole 20 mg daily.  She says that she was not completely getting over her symptoms, she would like to increase her doseage.  She says that her blood sugars have been improving.   She had blood sugars under 150 for a long time before getting sick recently.  She says she had 2 low blood sugars in the middle of the night. She says they were 53 and 49.  She says that increasing the metformin helped to lower her blood sugars.  She says that she is feeling like she had a lower carb supper and took her full suppertime dose of insulin and had an early morning low.  She is careful not to do this any longer.      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 Father: deceased heart problems  Mother: Deceased thyroid and breast cancer Siblings:  1 Sister alive and well  Social History: Reviewed history from 06-28-2010 and no changes required. Divorced 2 children daughter 40 yoa and 1 son 64 yoa  Review of Systems General:  Denies chills, fatigue, fever, loss of appetite, malaise, sleep disorder, sweats, weakness, and weight loss. Eyes:  Complains of discharge and itching; denies blurring, double vision, eye irritation, eye pain, halos, light sensitivity, red eye, vision loss-1 eye, and vision loss-both eyes. CV:  Denies bluish discoloration of lips or nails, chest pain or discomfort, difficulty breathing at night, difficulty breathing while lying down, fainting, fatigue, leg cramps with exertion, lightheadness, near fainting, palpitations, shortness of breath with exertion, swelling of feet, swelling of hands, and weight gain. Resp:  Complains of cough and sputum productive; denies chest discomfort, chest pain with inspiration, coughing up blood, excessive snoring, hypersomnolence, morning headaches, pleuritic, shortness of breath, and wheezing. GI:  Complains of nausea; denies abdominal pain, bloody stools, change in bowel habits, constipation, dark tarry stools, diarrhea, excessive appetite, gas, hemorrhoids, indigestion, loss of appetite, vomiting, vomiting blood, and yellowish skin color; severe acid reflux symptoms. GU:  Denies abnormal vaginal bleeding, decreased libido,  discharge, dysuria, genital sores, hematuria, incontinence, nocturia, urinary frequency, and urinary hesitancy. MS:  Denies joint pain, joint redness, joint swelling, loss of strength, low back pain, mid back pain, muscle aches, muscle , cramps, muscle weakness, stiffness, and thoracic pain. Neuro:   Denies brief paralysis, difficulty with concentration, disturbances in coordination, falling down, headaches, inability to speak, memory loss, numbness, poor balance, seizures, sensation of room spinning, tingling, tremors, visual disturbances, and weakness. Psych:  Denies alternate hallucination ( auditory/visual), anxiety, depression, easily angered, easily tearful, irritability, mental problems, panic attacks, sense of great danger, suicidal thoughts/plans, thoughts of violence, unusual visions or sounds, and thoughts /plans of harming others. Allergy:  Complains of itching eyes, seasonal allergies, and sneezing; denies hives or rash and persistent infections.  Physical Exam  General:  well developed, well nourished, no acute distress Head:  Normocephalic and atraumatic without obvious abnormalities. No apparent alopecia or balding. Eyes:  slightly injected bilateral  Ears:  External ear exam shows no significant lesions or deformities.  Otoscopic examination reveals clear canals, tympanic membranes are intact bilaterally without bulging, retraction, inflammation or discharge. Hearing is grossly normal bilaterally. Nose:  bilateral swollen, red nasal turbinates Mouth:  dry mucous membranes Neck:  supples, no LAD, goiter present Lungs:  Normal respiratory effort, chest expands symmetrically. Lungs are clear to auscultation, no crackles or wheezes. Heart:  Normal rate and regular rhythm. S1 and S2 normal without gallop, murmur, click, rub or other extra sounds. Pulses:  R and L carotid,radial,femoral,dorsalis pedis and posterior tibial pulses are full and equal bilaterally Extremities:  No clubbing, cyanosis, edema, or deformity noted with normal full range of motion of all joints.   Neurologic:  No cranial nerve deficits noted. Station and gait are normal. Plantar reflexes are down-going bilaterally. DTRs are symmetrical throughout. Sensory, motor and coordinative functions appear intact. Skin:   no lipohypertrophy Cervical Nodes:  No lymphadenopathy noted Psych:  Cognition and judgment appear intact. Alert and cooperative with normal attention span and concentration. No apparent delusions, illusions, hallucinations   Impression & Recommendations:  Problem # 1:  ALLERGIC RHINITIS CAUSE UNSPECIFIED (ICD-477.9)  Her updated medication list for this problem includes:    Fluticasone Propionate 50 Mcg/act Susp (Fluticasone propionate) .Marland Kitchen... 2 sprays per nostril once daily    Cetirizine Hcl 10 Mg Tabs (Cetirizine hcl) .Marland Kitchen... Take 1 by mouth daily for allergies  Problem # 2:  HYPOGLYCEMIA (ICD-251.2) glucagon emergency kit prescribed and explained to the patient.  Advised patient to decrease her insulin dose by 10 units for lower carb meals. The patient verbalized clear understanding.   Asked patient to call us immediately if she has any recurrence of hypoglycemia.   Problem # 3:  DIABETES MELLITUS, TYPE II, UNCONTROLLED (ICD-250.02)  Her updated medication list for this problem includes:    Humulin 70/30 Pen 70-30 % Susp (Insulin isophane & regular) .Marland KitchenMarland KitchenMarland KitchenMarland Kitchen 80 units twice a day subqu    Lisinopril 20 Mg Tabs (Lisinopril) .Marland Kitchen... 1 daily by mouth    Glucagon Emergency 1 Mg Kit (Glucagon (rdna)) ..... Use as directed  Problem # 4:  UNSPECIFIED ESSENTIAL HYPERTENSION (ICD-401.9)  Her updated medication list for this problem includes:    Hydrochlorothiazide 25 Mg Tabs (Hydrochlorothiazide) .Marland Kitchen... 1 daily by mouth    Lisinopril 20 Mg Tabs (Lisinopril) .Marland Kitchen... 1 daily by mouth  THE PATIENT DIDN'T TAKE ANY OF HER MEDS TODAY,  SHE WAS ADVISED TO TAKE THEM WHEN GETTING HOME. The patient verbalized clear understanding.    Complete Medication List: 1)  Humulin 70/30 Pen 70-30 % Susp (Insulin isophane & regular) .... 80 units twice a day subqu 2)  Hydrochlorothiazide 25 Mg Tabs (Hydrochlorothiazide) .Marland Kitchen.. 1 daily by mouth 3)  Methimazole 10 Mg Tabs (Methimazole) .Marland Kitchen.. 1 daily by mouth 4)  Omeprazole  20 Mg Cpdr (Omeprazole) .Marland Kitchen.. 1 daily by mouth 5)  Pravastatin Sodium 40 Mg Tabs (Pravastatin sodium) .Marland Kitchen.. 1 daily by mouth 6)  Lisinopril 20 Mg Tabs (Lisinopril) .Marland Kitchen.. 1 daily by mouth 7)  Omeprazole 40 Mg Cpdr (Omeprazole) .... Take 1 by mouth every morning 8)  Fluticasone Propionate 50 Mcg/act Susp (Fluticasone propionate) .... 2 sprays per nostril once daily 9)  Cetirizine Hcl 10 Mg Tabs (Cetirizine hcl) .... Take 1 by mouth daily for allergies 10)  Glucagon Emergency 1 Mg Kit (Glucagon (rdna)) .... Use as directed  Patient Instructions: 1)  Go to the pharmacy and pick up your prescription (s).  It may take up to 30 mins for electronic prescriptions to be delivered to the pharmacy.  Please call if your pharmacy has not received your prescriptions after 30 minutes.   2)  Check your blood sugars regularly. If your readings are usually above : or below 70 you should contact our office. 3)  It is important that your Diabetic A1c level is checked every 3 months. 4)  See your eye doctor yearly to check for diabetic eye damage. 5)  Check your feet each night for sore areas, calluses or signs of infection. 6)  Check your Blood Pressure regularly. If it is above: 140/90 you should make an appointment. 7)  Return or go to the ER if no improvement or symptoms getting worse.   8)  The patient was informed that there is no on-call provider or services available at this clinic during off-hours (when the clinic is closed).  If the patient developed a problem or concern that required immediate attention, the patient was advised to go the the nearest available urgent care or emergency department for medical care.  The patient verbalized understanding.    Prescriptions: GLUCAGON EMERGENCY 1 MG KIT (GLUCAGON (RDNA)) use as directed  #1 x 5   Entered and Authorized by:   Standley Dakins MD   Signed by:   Standley Dakins MD on 08/22/2010   Method used:   Electronically to        [facility] [address]  (retail)       [address], [address]       [place], [place]  [address]       Ph: [phone]       Fax: [phone]   RxID:   [id number] CETIRIZINE HCL 10 MG TABS (CETIRIZINE HCL) take 1 by mouth daily for allergies  #30 x 3   Entered and Authorized by:   Standley Dakins MD   Signed by:   Standley Dakins MD on 08/22/2010   Method used:   Electronically to        [facility] [address] (retail)       [address], [address]       [place], [place]  [address]       Ph: [phone]       Fax: [phone]   RxID:   [id number] FLUTICASONE PROPIONATE 50 MCG/ACT SUSP (FLUTICASONE PROPIONATE) 2 sprays per nostril once daily  #1 x 2   Entered and Authorized by:  Standley Dakins MD   Signed by:   Standley Dakins MD on 08/22/2010   Method used:   Electronically to        [facility] [address] (retail)       [address], [address]       [place], [place]  [address]       Ph: [phone]       Fax: [phone]   RxID:   [id number] OMEPRAZOLE 40 MG CPDR (OMEPRAZOLE) take 1 by mouth every morning  #90 x 3   Entered and Authorized by:   Standley Dakins MD   Signed by:   Standley Dakins MD on 08/22/2010   Method used:   Electronically to        [facility] [address] (retail)       [address], [address]       [place], [place]  [address]       Ph: [phone]       Fax: [phone]   RxID:   [id number]

## 2010-08-27 NOTE — Letter (Signed)
Summary: Out of Work  [facility]  [address]   [place], [place] [address]   Phone: [phone]  Fax: [phone]    August 22, 2010   Employee:  DEJANEE THIBEAUX    To Whom It May Concern:   For Medical reasons, please excuse the above named employee from work for the following dates:  Start:   August 17, 2010  End:   August 24, 2010  If you need additional information, please feel free to contact our office.         Sincerely,    Standley Dakins MD

## 2010-10-05 ENCOUNTER — Telehealth: Payer: Self-pay | Admitting: Family Medicine

## 2010-10-13 NOTE — Progress Notes (Signed)
  Phone Note Call from Patient   Summary of Call: Patient call in stated that she need to talk with you regarding her blood sugar have not been feeling well also she stated that her allergies have been giving her problems please call patient @ [phone] Initial call taken by: Eugenio Hoes,  October 05, 2010 10:56 AM  Follow-up for Phone Call        bs running in 200's while under stress. Is on 70/30 85u two times a day and unspecified sliding scale of 70/30. Told patient that Dr. Laural Benes has ended services here and referred her to [facility] endocrinology to be seens asap. Until then to take 1 unit/ 10 fbs>150 in am and half of that in pm

## 2010-11-24 NOTE — Letter (Signed)
August 31, 2007   Graylin Shiver, M.D.  [phone] [address]  [address]  [place], [place] [address]   Re:  Kristina Adams, Kristina Adams                 DOB:  April 17, 1966   Dear Dr. Evette Cristal:   I appreciate the opportunity of seeing Kristina Adams.  This is a very  interesting patient who came in to the office with multiple CT scans.  She has a 20-year history of diabetes mellitus, and is being followed by  Dr. Talmage Nap for this.  She was having some gaseous bloating activity in  her upper abdomen, and occasional constipation, and had no GERD.  She  had a gastric emptying study, and then underwent multiple scans, as well  as an endoscopy.  At endoscopy she was found to have lesion in her  distal esophagus that has been biopsied by Dr. 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  The other possibility, she was found  to have a large lobulated anterior mass.  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  She apparently has been on thyroid medication for this,  and is followed for that by Dr. Talmage Nap.   PREVIOUS SURGERIES:  Previous surgeries have been a C-section.   MEDICATIONS:  Include Humalog insulin, Nexium, metformin, __________,  lisinopril, hydrochlorothiazide, Reglan.   SHE HAS NO ALLERGIES.   She also has hypertension and obesity.   FAMILY HISTORY:  Noncontributory.   SOCIAL HISTORY:  She is single and has 2 children.  She is a Biochemist, clinical.  She does not smoke, quit smoking at age 23.  Occasional  alcohol intake.   REVIEW OF SYSTEMS:  She is 5 feet 10 inches, and obviously obese.  Her  weight has been stable, maybe lost a few pounds.  CARDIAC:  No angina or atrial fibrillation.  PULMONARY:  No bronchitis, hemoptysis, asthma, or wheezing.  GI:  GERD and hiatal hernia, diarrhea and constipation.  GU:  No kidney disease or dysuria.  VASCULAR:   She gets some pain in her feet, but no claudication, DVT or  TIA.  NEUROLOGICAL:  She has occasional headaches.  No blackouts or seizures.  No recent change in her eyesight or hearing.  MUSCULOSKELETAL:  She has some muscle pain.  PSYCHIATRIC:  No illnesses.  No problems with bleeding, clotting disorders or anemia.   PHYSICAL EXAMINATION:  She is a large [place] American female in no  acute distress.  Her blood pressure is 144/83.  Pulse 76.  Respirations  18.  Saturations were 97%.  Head, eyes, ears, nose and throat were  unremarkable.  Neck:  There is a large freely movable thyroid mass that  appeared to be goitrous.  Chest is clear to auscultation and percussion.  Heart regular sinus rhythm.  No murmurs.  Abdomen is obese.  Bowel  sounds are normal.  Pulses are 2+.  There is no clubbing or edema.  Neurological:  She is oriented x3.  Sensory and motor intact.  Cranial  nerves intact.   I will await to hear what [name] has to say, but I think she is  looking at possibly 2 to 3 surgeries.  I will see what Dr. Talmage Nap feels  about her thyroid, but I feel with the  tracheal deviation that she would  be a candidate for a thyroidectomy.  She also has the anterior  mediastinal mass that needs to be addressed.  We generally resect these  without a needle biopsy, but in her case we may have a needle biopsy  done.  Also, I want to wait to find out what is going on as far as her  leiomyoma since this would also possibly need to have VATS resection of  this.  I plan to see her back again in 2 weeks to discuss the situation.  I am looking forward to hearing the results of her other biopsies.   Ines Bloomer, M.D.  Electronically Signed   DPB/MEDQ  D:  08/31/2007  T:  09/01/2007  Job:  [id number]   cc:   Rachael Fee, MD  Dorisann Frames, M.D.  Lavonda Jumbo, M.D.

## 2010-11-27 NOTE — Letter (Signed)
September 12, 2007   Graylin Shiver, M.D.  [phone] [address]  [address]  [place], [place] [address]   Dear Dr. Evette Cristal,   Re:  Kristina Adams E                 DOB:  19-Apr-1966   We received a call from Kristina Adams that she was canceling her followup  appointment.  She said that she wanted to seek another opinion an  apparently was not satisfied with the opinion that we rendered.  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  Should she  need to have the name of a thoracic surgeon in either of these areas, I  would be happy to provide them.   Ines Bloomer, M.D.  Electronically Signed   DPB/MEDQ  D:  09/12/2007  T:  09/12/2007  Job:  [id number]

## 2011-02-24 ENCOUNTER — Other Ambulatory Visit (HOSPITAL_COMMUNITY): Payer: Self-pay | Admitting: Endocrinology

## 2011-02-24 DIAGNOSIS — E0590 Thyrotoxicosis, unspecified without thyrotoxic crisis or storm: Secondary | ICD-10-CM

## 2011-03-11 ENCOUNTER — Ambulatory Visit (HOSPITAL_COMMUNITY): Payer: Federal, State, Local not specified - PPO

## 2011-03-12 ENCOUNTER — Other Ambulatory Visit (HOSPITAL_COMMUNITY): Payer: Federal, State, Local not specified - PPO

## 2011-03-26 ENCOUNTER — Other Ambulatory Visit: Payer: Self-pay | Admitting: Obstetrics and Gynecology

## 2011-04-14 LAB — GLUCOSE, CAPILLARY
Glucose-Capillary: 171 — ABNORMAL HIGH
Glucose-Capillary: 290 — ABNORMAL HIGH

## 2011-06-09 ENCOUNTER — Other Ambulatory Visit (HOSPITAL_COMMUNITY): Payer: Federal, State, Local not specified - PPO

## 2011-06-09 ENCOUNTER — Ambulatory Visit (HOSPITAL_COMMUNITY): Payer: Federal, State, Local not specified - PPO

## 2011-06-10 ENCOUNTER — Other Ambulatory Visit (HOSPITAL_COMMUNITY): Payer: Federal, State, Local not specified - PPO

## 2011-06-17 ENCOUNTER — Ambulatory Visit (HOSPITAL_COMMUNITY): Payer: Federal, State, Local not specified - PPO

## 2011-06-17 ENCOUNTER — Other Ambulatory Visit (HOSPITAL_COMMUNITY): Payer: Federal, State, Local not specified - PPO

## 2011-06-18 ENCOUNTER — Other Ambulatory Visit (HOSPITAL_COMMUNITY): Payer: Federal, State, Local not specified - PPO

## 2011-07-22 ENCOUNTER — Ambulatory Visit (HOSPITAL_COMMUNITY): Payer: Federal, State, Local not specified - PPO

## 2011-07-22 ENCOUNTER — Encounter (HOSPITAL_COMMUNITY)
Admission: RE | Admit: 2011-07-22 | Discharge: 2011-07-22 | Disposition: A | Discharge status: Home / Self Care | Payer: Federal, State, Local not specified - PPO | Source: Ambulatory Visit | Attending: Endocrinology | Admitting: Endocrinology

## 2011-07-22 ENCOUNTER — Other Ambulatory Visit (HOSPITAL_COMMUNITY): Payer: Federal, State, Local not specified - PPO

## 2011-07-22 DIAGNOSIS — E042 Nontoxic multinodular goiter: Secondary | ICD-10-CM | POA: Insufficient documentation

## 2011-07-22 DIAGNOSIS — E0590 Thyrotoxicosis, unspecified without thyrotoxic crisis or storm: Secondary | ICD-10-CM

## 2011-07-23 ENCOUNTER — Encounter (HOSPITAL_COMMUNITY)
Admission: RE | Admit: 2011-07-23 | Discharge: 2011-07-23 | Disposition: A | Discharge status: Home / Self Care | Payer: Federal, State, Local not specified - PPO | Source: Ambulatory Visit | Attending: Endocrinology | Admitting: Endocrinology

## 2011-07-23 ENCOUNTER — Other Ambulatory Visit (HOSPITAL_COMMUNITY): Payer: Federal, State, Local not specified - PPO

## 2011-07-23 MED ORDER — SODIUM PERTECHNETATE TC 99M INJECTION
10.5000 | Freq: Once | INTRAVENOUS | Status: AC | PRN
  Administered 2011-07-23: 10.5 via INTRAVENOUS

## 2011-07-23 MED ORDER — SODIUM IODIDE I 131 CAPSULE: 9.4000 | Freq: Once | INTRAVENOUS | Status: AC | PRN

## 2011-08-02 ENCOUNTER — Other Ambulatory Visit: Payer: Self-pay | Admitting: Endocrinology

## 2011-08-02 DIAGNOSIS — E041 Nontoxic single thyroid nodule: Secondary | ICD-10-CM

## 2011-08-11 ENCOUNTER — Inpatient Hospital Stay: Admission: RE | Admit: 2011-08-11 | Payer: Federal, State, Local not specified - PPO | Source: Ambulatory Visit

## 2011-08-11 ENCOUNTER — Inpatient Hospital Stay
Admission: RE | Admit: 2011-08-11 | Discharge: 2011-08-11 | Payer: Federal, State, Local not specified - PPO | Source: Ambulatory Visit | Attending: Endocrinology | Admitting: Endocrinology

## 2011-08-18 ENCOUNTER — Ambulatory Visit
Admission: RE | Admit: 2011-08-18 | Discharge: 2011-08-18 | Disposition: A | Discharge status: Home / Self Care | Payer: Federal, State, Local not specified - PPO | Source: Ambulatory Visit | Attending: Endocrinology | Admitting: Endocrinology

## 2011-08-18 ENCOUNTER — Other Ambulatory Visit (HOSPITAL_COMMUNITY)
Admission: RE | Admit: 2011-08-18 | Discharge: 2011-08-18 | Disposition: A | Discharge status: Home / Self Care | Payer: Federal, State, Local not specified - PPO | Source: Ambulatory Visit | Attending: Interventional Radiology | Admitting: Interventional Radiology

## 2011-08-18 DIAGNOSIS — R220 Localized swelling, mass and lump, head: Secondary | ICD-10-CM | POA: Insufficient documentation

## 2011-08-18 DIAGNOSIS — E041 Nontoxic single thyroid nodule: Secondary | ICD-10-CM

## 2011-10-06 DIAGNOSIS — E119 Type 2 diabetes mellitus without complications: Secondary | ICD-10-CM | POA: Insufficient documentation

## 2011-12-03 ENCOUNTER — Encounter (HOSPITAL_COMMUNITY): Payer: Self-pay | Admitting: Anesthesiology

## 2011-12-03 ENCOUNTER — Emergency Department (HOSPITAL_COMMUNITY): Payer: Federal, State, Local not specified - PPO

## 2011-12-03 ENCOUNTER — Encounter (HOSPITAL_COMMUNITY): Payer: Self-pay | Admitting: *Deleted

## 2011-12-03 ENCOUNTER — Encounter (HOSPITAL_COMMUNITY): Admission: EM | Disposition: A | Discharge status: Home / Self Care | Payer: Self-pay | Source: Home / Self Care

## 2011-12-03 ENCOUNTER — Inpatient Hospital Stay (HOSPITAL_COMMUNITY)
Admission: EM | Admit: 2011-12-03 | Discharge: 2011-12-09 | DRG: 883 | Disposition: A | Discharge status: Home / Self Care | Payer: Federal, State, Local not specified - PPO | Attending: General Surgery | Admitting: General Surgery

## 2011-12-03 ENCOUNTER — Emergency Department (HOSPITAL_COMMUNITY): Payer: Federal, State, Local not specified - PPO | Admitting: Anesthesiology

## 2011-12-03 DIAGNOSIS — F3289 Other specified depressive episodes: Secondary | ICD-10-CM | POA: Diagnosis present

## 2011-12-03 DIAGNOSIS — F329 Major depressive disorder, single episode, unspecified: Secondary | ICD-10-CM | POA: Diagnosis present

## 2011-12-03 DIAGNOSIS — G4730 Sleep apnea, unspecified: Secondary | ICD-10-CM

## 2011-12-03 DIAGNOSIS — Z794 Long term (current) use of insulin: Secondary | ICD-10-CM

## 2011-12-03 DIAGNOSIS — F32A Depression, unspecified: Secondary | ICD-10-CM | POA: Diagnosis present

## 2011-12-03 DIAGNOSIS — E119 Type 2 diabetes mellitus without complications: Secondary | ICD-10-CM | POA: Diagnosis present

## 2011-12-03 DIAGNOSIS — K560 Paralytic ileus: Secondary | ICD-10-CM | POA: Diagnosis not present

## 2011-12-03 DIAGNOSIS — K3533 Acute appendicitis with perforation and localized peritonitis, with abscess: Principal | ICD-10-CM | POA: Diagnosis present

## 2011-12-03 DIAGNOSIS — D649 Anemia, unspecified: Secondary | ICD-10-CM | POA: Diagnosis present

## 2011-12-03 DIAGNOSIS — F17200 Nicotine dependence, unspecified, uncomplicated: Secondary | ICD-10-CM | POA: Diagnosis present

## 2011-12-03 DIAGNOSIS — I10 Essential (primary) hypertension: Secondary | ICD-10-CM

## 2011-12-03 DIAGNOSIS — D509 Iron deficiency anemia, unspecified: Secondary | ICD-10-CM | POA: Diagnosis present

## 2011-12-03 DIAGNOSIS — K3580 Unspecified acute appendicitis: Secondary | ICD-10-CM

## 2011-12-03 DIAGNOSIS — Z6841 Body Mass Index (BMI) 40.0 and over, adult: Secondary | ICD-10-CM

## 2011-12-03 DIAGNOSIS — M549 Dorsalgia, unspecified: Secondary | ICD-10-CM | POA: Diagnosis present

## 2011-12-03 DIAGNOSIS — G8929 Other chronic pain: Secondary | ICD-10-CM

## 2011-12-03 DIAGNOSIS — K3532 Acute appendicitis with perforation and localized peritonitis, without abscess: Secondary | ICD-10-CM

## 2011-12-03 HISTORY — DX: Sleep apnea, unspecified: G47.30

## 2011-12-03 HISTORY — DX: Dorsalgia, unspecified: M54.9

## 2011-12-03 HISTORY — DX: Unspecified acute appendicitis: K35.80

## 2011-12-03 HISTORY — DX: Long term (current) use of insulin: Z79.4

## 2011-12-03 HISTORY — DX: Morbid (severe) obesity due to excess calories: E66.01

## 2011-12-03 HISTORY — DX: Anemia, unspecified: D64.9

## 2011-12-03 HISTORY — DX: Type 2 diabetes mellitus without complications: Z79.4

## 2011-12-03 HISTORY — DX: Essential (primary) hypertension: I10

## 2011-12-03 HISTORY — PX: LAPAROSCOPIC APPENDECTOMY: SHX408

## 2011-12-03 HISTORY — DX: Type 2 diabetes mellitus without complications: E11.9

## 2011-12-03 HISTORY — DX: Other chronic pain: G89.29

## 2011-12-03 HISTORY — DX: Body Mass Index (BMI) 40.0 and over, adult: Z684

## 2011-12-03 LAB — GLUCOSE, CAPILLARY: Glucose-Capillary: 250 mg/dL — ABNORMAL HIGH (ref 70–99)

## 2011-12-03 LAB — COMPREHENSIVE METABOLIC PANEL
Albumin: 3.3 g/dL — ABNORMAL LOW (ref 3.5–5.2)
BUN: 10 mg/dL (ref 6–23)
Chloride: 97 mEq/L (ref 96–112)
Creatinine, Ser: 0.7 mg/dL (ref 0.50–1.10)
Total Bilirubin: 0.4 mg/dL (ref 0.3–1.2)

## 2011-12-03 LAB — CBC
HCT: 31.5 % — ABNORMAL LOW (ref 36.0–46.0)
MCH: 25 pg — ABNORMAL LOW (ref 26.0–34.0)
MCHC: 31.1 g/dL (ref 30.0–36.0)
MCV: 80.4 fL (ref 78.0–100.0)
RDW: 15.3 % (ref 11.5–15.5)
WBC: 24.3 10*3/uL — ABNORMAL HIGH (ref 4.0–10.5)

## 2011-12-03 LAB — LIPASE, BLOOD: Lipase: 9 U/L — ABNORMAL LOW (ref 11–59)

## 2011-12-03 SURGERY — APPENDECTOMY, LAPAROSCOPIC
Anesthesia: General | Site: Abdomen | Wound class: Dirty or Infected

## 2011-12-03 MED ORDER — ACETAMINOPHEN 325 MG PO TABS
650.0000 mg | ORAL_TABLET | Freq: Four times a day (QID) | ORAL | Status: DC | PRN
  Administered 2011-12-04 – 2011-12-05 (×2): 650 mg via ORAL
  Filled 2011-12-03 (×2): qty 2

## 2011-12-03 MED ORDER — ROCURONIUM BROMIDE 100 MG/10ML IV SOLN
INTRAVENOUS | Status: DC | PRN
  Administered 2011-12-03: 20 mg via INTRAVENOUS
  Administered 2011-12-03: 30 mg via INTRAVENOUS

## 2011-12-03 MED ORDER — CIPROFLOXACIN IN D5W 400 MG/200ML IV SOLN
400.0000 mg | Freq: Two times a day (BID) | INTRAVENOUS | Status: DC
  Administered 2011-12-03 – 2011-12-07 (×8): 400 mg via INTRAVENOUS
  Filled 2011-12-03 (×10): qty 200

## 2011-12-03 MED ORDER — LACTATED RINGERS IV SOLN
INTRAVENOUS | Status: DC | PRN
  Administered 2011-12-03: 18:00:00 via INTRAVENOUS

## 2011-12-03 MED ORDER — PROMETHAZINE HCL 25 MG/ML IJ SOLN: 6.2500 mg | INTRAMUSCULAR | Status: DC | PRN

## 2011-12-03 MED ORDER — MORPHINE SULFATE 2 MG/ML IJ SOLN: 1.0000 mg | INTRAMUSCULAR | Status: DC | PRN

## 2011-12-03 MED ORDER — HYDROMORPHONE HCL PF 1 MG/ML IJ SOLN
INTRAMUSCULAR | Status: AC
  Filled 2011-12-03: qty 1

## 2011-12-03 MED ORDER — ONDANSETRON HCL 4 MG/2ML IJ SOLN
4.0000 mg | Freq: Four times a day (QID) | INTRAMUSCULAR | Status: DC | PRN
  Administered 2011-12-04 – 2011-12-06 (×2): 4 mg via INTRAVENOUS
  Filled 2011-12-03 (×2): qty 2

## 2011-12-03 MED ORDER — SODIUM CHLORIDE 0.9 % IV SOLN
1.0000 g | Freq: Once | INTRAVENOUS | Status: AC
  Administered 2011-12-03: 1 g via INTRAVENOUS
  Filled 2011-12-03: qty 1

## 2011-12-03 MED ORDER — FENTANYL CITRATE 0.05 MG/ML IJ SOLN
INTRAMUSCULAR | Status: DC | PRN
  Administered 2011-12-03: 50 ug via INTRAVENOUS
  Administered 2011-12-03: 100 ug via INTRAVENOUS
  Administered 2011-12-03 (×5): 50 ug via INTRAVENOUS
  Administered 2011-12-03: 100 ug via INTRAVENOUS

## 2011-12-03 MED ORDER — LIDOCAINE HCL 1 % IJ SOLN
INTRAMUSCULAR | Status: DC | PRN
  Administered 2011-12-03: 18 mL

## 2011-12-03 MED ORDER — ENOXAPARIN SODIUM 40 MG/0.4ML ~~LOC~~ SOLN
40.0000 mg | SUBCUTANEOUS | Status: DC
  Administered 2011-12-04 – 2011-12-09 (×6): 40 mg via SUBCUTANEOUS
  Filled 2011-12-03 (×7): qty 0.4

## 2011-12-03 MED ORDER — ONDANSETRON HCL 4 MG/2ML IJ SOLN
INTRAMUSCULAR | Status: DC | PRN
  Administered 2011-12-03: 4 mg via INTRAVENOUS

## 2011-12-03 MED ORDER — NEOSTIGMINE METHYLSULFATE 1 MG/ML IJ SOLN
INTRAMUSCULAR | Status: DC | PRN
  Administered 2011-12-03: 4 mg via INTRAVENOUS

## 2011-12-03 MED ORDER — LIDOCAINE HCL 1 % IJ SOLN
INTRAMUSCULAR | Status: AC
  Filled 2011-12-03: qty 20

## 2011-12-03 MED ORDER — PROPOFOL 10 MG/ML IV BOLUS
INTRAVENOUS | Status: DC | PRN
  Administered 2011-12-03: 200 mg via INTRAVENOUS

## 2011-12-03 MED ORDER — KETOROLAC TROMETHAMINE 30 MG/ML IJ SOLN: 15.0000 mg | Freq: Once | INTRAMUSCULAR | Status: DC | PRN

## 2011-12-03 MED ORDER — METRONIDAZOLE IN NACL 5-0.79 MG/ML-% IV SOLN
500.0000 mg | Freq: Three times a day (TID) | INTRAVENOUS | Status: DC
  Administered 2011-12-03 – 2011-12-07 (×12): 500 mg via INTRAVENOUS
  Filled 2011-12-03 (×16): qty 100

## 2011-12-03 MED ORDER — HYDROMORPHONE HCL PF 1 MG/ML IJ SOLN
1.0000 mg | Freq: Once | INTRAMUSCULAR | Status: AC
  Administered 2011-12-03: 1 mg via INTRAVENOUS
  Filled 2011-12-03: qty 1

## 2011-12-03 MED ORDER — POTASSIUM CHLORIDE IN NACL 20-0.9 MEQ/L-% IV SOLN
INTRAVENOUS | Status: DC
  Administered 2011-12-03 – 2011-12-07 (×7): via INTRAVENOUS
  Filled 2011-12-03 (×15): qty 1000

## 2011-12-03 MED ORDER — SODIUM CHLORIDE 0.9 % IV BOLUS (SEPSIS)
1000.0000 mL | Freq: Once | INTRAVENOUS | Status: AC
  Administered 2011-12-03: 1000 mL via INTRAVENOUS

## 2011-12-03 MED ORDER — INSULIN ASPART 100 UNIT/ML ~~LOC~~ SOLN
0.0000 [IU] | SUBCUTANEOUS | Status: DC
  Administered 2011-12-04 (×2): 7 [IU] via SUBCUTANEOUS
  Administered 2011-12-04: 4 [IU] via SUBCUTANEOUS
  Administered 2011-12-04: 11 [IU] via SUBCUTANEOUS
  Administered 2011-12-04 (×2): 7 [IU] via SUBCUTANEOUS
  Administered 2011-12-05: 4 [IU] via SUBCUTANEOUS
  Administered 2011-12-05: 7 [IU] via SUBCUTANEOUS
  Administered 2011-12-05 – 2011-12-06 (×5): 4 [IU] via SUBCUTANEOUS
  Administered 2011-12-06: 7 [IU] via SUBCUTANEOUS
  Administered 2011-12-06 (×4): 4 [IU] via SUBCUTANEOUS
  Administered 2011-12-07 (×2): 7 [IU] via SUBCUTANEOUS
  Administered 2011-12-07 (×3): 4 [IU] via SUBCUTANEOUS
  Administered 2011-12-07 – 2011-12-08 (×5): 7 [IU] via SUBCUTANEOUS
  Administered 2011-12-08: 4 [IU] via SUBCUTANEOUS
  Administered 2011-12-08: 11 [IU] via SUBCUTANEOUS
  Administered 2011-12-09: 7 [IU] via SUBCUTANEOUS
  Administered 2011-12-09 (×2): 4 [IU] via SUBCUTANEOUS
  Administered 2011-12-09: 7 [IU] via SUBCUTANEOUS

## 2011-12-03 MED ORDER — MIDAZOLAM HCL 5 MG/5ML IJ SOLN
INTRAMUSCULAR | Status: DC | PRN
  Administered 2011-12-03: 2 mg via INTRAVENOUS

## 2011-12-03 MED ORDER — ACETAMINOPHEN 650 MG RE SUPP: 650.0000 mg | Freq: Four times a day (QID) | RECTAL | Status: DC | PRN

## 2011-12-03 MED ORDER — SUCCINYLCHOLINE CHLORIDE 20 MG/ML IJ SOLN
INTRAMUSCULAR | Status: DC | PRN
  Administered 2011-12-03: 140 mg via INTRAVENOUS

## 2011-12-03 MED ORDER — MORPHINE SULFATE 4 MG/ML IJ SOLN
4.0000 mg | INTRAMUSCULAR | Status: DC | PRN
  Administered 2011-12-03 – 2011-12-06 (×23): 4 mg via INTRAVENOUS
  Filled 2011-12-03 (×24): qty 1

## 2011-12-03 MED ORDER — PANTOPRAZOLE SODIUM 40 MG IV SOLR
40.0000 mg | Freq: Every day | INTRAVENOUS | Status: DC
  Administered 2011-12-04 – 2011-12-06 (×4): 40 mg via INTRAVENOUS
  Filled 2011-12-03 (×5): qty 40

## 2011-12-03 MED ORDER — LACTATED RINGERS IV SOLN
INTRAVENOUS | Status: DC
  Administered 2011-12-03: 21:00:00 via INTRAVENOUS
  Administered 2011-12-03: 1000 mL via INTRAVENOUS

## 2011-12-03 MED ORDER — LACTATED RINGERS IV SOLN
INTRAVENOUS | Status: DC | PRN
  Administered 2011-12-03: 100 mL via INTRAVENOUS

## 2011-12-03 MED ORDER — BUPIVACAINE-EPINEPHRINE 0.25% -1:200000 IJ SOLN
INTRAMUSCULAR | Status: DC | PRN
  Administered 2011-12-03: 18 mL

## 2011-12-03 MED ORDER — HYDROCHLOROTHIAZIDE 12.5 MG PO CAPS
12.5000 mg | ORAL_CAPSULE | Freq: Every day | ORAL | Status: DC
  Administered 2011-12-04 – 2011-12-09 (×7): 12.5 mg via ORAL
  Filled 2011-12-03 (×7): qty 1

## 2011-12-03 MED ORDER — SODIUM CHLORIDE 0.9 % IV SOLN
1.0000 g | INTRAVENOUS | Status: DC
  Administered 2011-12-04 – 2011-12-05 (×2): 1 g via INTRAVENOUS
  Filled 2011-12-03 (×3): qty 1

## 2011-12-03 MED ORDER — GLYCOPYRROLATE 0.2 MG/ML IJ SOLN
INTRAMUSCULAR | Status: DC | PRN
  Administered 2011-12-03: .5 mg via INTRAVENOUS

## 2011-12-03 MED ORDER — HYDROCODONE-ACETAMINOPHEN 5-325 MG PO TABS
1.0000 | ORAL_TABLET | ORAL | Status: DC | PRN
  Administered 2011-12-03 – 2011-12-06 (×3): 2 via ORAL
  Administered 2011-12-06: 1 via ORAL
  Administered 2011-12-06 – 2011-12-07 (×5): 2 via ORAL
  Filled 2011-12-03 (×9): qty 2

## 2011-12-03 MED ORDER — POTASSIUM CHLORIDE IN NACL 20-0.9 MEQ/L-% IV SOLN
INTRAVENOUS | Status: DC
  Filled 2011-12-03 (×3): qty 1000

## 2011-12-03 MED ORDER — SODIUM CHLORIDE 0.9 % IV SOLN
Freq: Once | INTRAVENOUS | Status: AC
  Administered 2011-12-03: 15:00:00 via INTRAVENOUS

## 2011-12-03 MED ORDER — LISINOPRIL 20 MG PO TABS
30.0000 mg | ORAL_TABLET | Freq: Every day | ORAL | Status: DC
  Administered 2011-12-04 – 2011-12-09 (×7): 30 mg via ORAL
  Filled 2011-12-03 (×7): qty 1

## 2011-12-03 MED ORDER — HYDROMORPHONE HCL PF 1 MG/ML IJ SOLN
0.2500 mg | INTRAMUSCULAR | Status: DC | PRN
  Administered 2011-12-03 (×2): 0.5 mg via INTRAVENOUS

## 2011-12-03 MED ORDER — ONDANSETRON HCL 4 MG/2ML IJ SOLN
4.0000 mg | Freq: Once | INTRAMUSCULAR | Status: AC
  Administered 2011-12-03: 4 mg via INTRAVENOUS
  Filled 2011-12-03: qty 2

## 2011-12-03 MED ORDER — BUPIVACAINE-EPINEPHRINE PF 0.25-1:200000 % IJ SOLN
INTRAMUSCULAR | Status: AC
  Filled 2011-12-03: qty 30

## 2011-12-03 SURGICAL SUPPLY — 49 items
APPLIER CLIP ROT 10 11.4 M/L (STAPLE)
BENZOIN TINCTURE PRP APPL 2/3 (GAUZE/BANDAGES/DRESSINGS) ×2 IMPLANT
CANISTER SUCTION 2500CC (MISCELLANEOUS) ×2 IMPLANT
CLIP APPLIE ROT 10 11.4 M/L (STAPLE) IMPLANT
CLOTH BEACON ORANGE TIMEOUT ST (SAFETY) ×2 IMPLANT
CLSR STERI-STRIP ANTIMIC 1/2X4 (GAUZE/BANDAGES/DRESSINGS) ×2 IMPLANT
CUTTER FLEX LINEAR 45M (STAPLE) ×2 IMPLANT
DECANTER SPIKE VIAL GLASS SM (MISCELLANEOUS) ×2 IMPLANT
DERMABOND ADVANCED (GAUZE/BANDAGES/DRESSINGS) ×1
DERMABOND ADVANCED .7 DNX12 (GAUZE/BANDAGES/DRESSINGS) ×1 IMPLANT
DRAIN CHANNEL RND F F (WOUND CARE) ×4 IMPLANT
DRAPE LAPAROSCOPIC ABDOMINAL (DRAPES) ×2 IMPLANT
DRAPE WARM FLUID 44X44 (DRAPE) ×2 IMPLANT
ELECT REM PT RETURN 9FT ADLT (ELECTROSURGICAL) ×2
ELECTRODE REM PT RTRN 9FT ADLT (ELECTROSURGICAL) ×1 IMPLANT
ENDOLOOP SUT PDS II  0 18 (SUTURE)
ENDOLOOP SUT PDS II 0 18 (SUTURE) IMPLANT
EVACUATOR SILICONE 100CC (DRAIN) ×4 IMPLANT
GLOVE BIOGEL PI IND STRL 7.0 (GLOVE) ×1 IMPLANT
GLOVE BIOGEL PI INDICATOR 7.0 (GLOVE) ×1
GLOVE INDICATOR 8.0 STRL GRN (GLOVE) ×4 IMPLANT
GLOVE SS BIOGEL STRL SZ 8 (GLOVE) ×1 IMPLANT
GLOVE SUPERSENSE BIOGEL SZ 8 (GLOVE) ×1
GOWN STRL NON-REIN LRG LVL3 (GOWN DISPOSABLE) ×2 IMPLANT
GOWN STRL REIN XL XLG (GOWN DISPOSABLE) ×4 IMPLANT
HAND ACTIVATED (MISCELLANEOUS) ×2 IMPLANT
KIT BASIN OR (CUSTOM PROCEDURE TRAY) ×2 IMPLANT
PENCIL BUTTON HOLSTER BLD 10FT (ELECTRODE) ×2 IMPLANT
POUCH SPECIMEN RETRIEVAL 10MM (ENDOMECHANICALS) ×2 IMPLANT
RELOAD 45 VASCULAR/THIN (ENDOMECHANICALS) IMPLANT
RELOAD STAPLE TA45 3.5 REG BLU (ENDOMECHANICALS) ×2 IMPLANT
SET IRRIG TUBING LAPAROSCOPIC (IRRIGATION / IRRIGATOR) ×2 IMPLANT
SLEEVE XCEL OPT CAN 5 100 (ENDOMECHANICALS) ×2 IMPLANT
SPONGE GAUZE 4X4 12PLY (GAUZE/BANDAGES/DRESSINGS) ×2 IMPLANT
SUT ETHILON 2 0 PS N (SUTURE) ×4 IMPLANT
SUT MNCRL AB 4-0 PS2 18 (SUTURE) ×2 IMPLANT
SUT VIC AB 2-0 SH 27 (SUTURE) ×2
SUT VIC AB 2-0 SH 27X BRD (SUTURE) ×2 IMPLANT
SUT VICRYL 0 UR6 27IN ABS (SUTURE) ×2 IMPLANT
TAPE CLOTH SURG 4X10 WHT LF (GAUZE/BANDAGES/DRESSINGS) ×2 IMPLANT
TOWEL OR 17X26 10 PK STRL BLUE (TOWEL DISPOSABLE) ×2 IMPLANT
TRAY FOLEY CATH 14FRSI W/METER (CATHETERS) ×2 IMPLANT
TRAY LAP CHOLE (CUSTOM PROCEDURE TRAY) ×2 IMPLANT
TROCAR BALLN 12MMX100 BLUNT (TROCAR) ×2 IMPLANT
TROCAR BLADELESS OPT 5 100 (ENDOMECHANICALS) ×2 IMPLANT
TROCAR BLADELESS OPT 5 75 (ENDOMECHANICALS) ×2 IMPLANT
TROCAR XCEL 12X100 BLDLESS (ENDOMECHANICALS) ×2 IMPLANT
TROCAR XCEL BLUNT TIP 100MML (ENDOMECHANICALS) ×2 IMPLANT
TUBING INSUFFLATION 10FT LAP (TUBING) ×2 IMPLANT

## 2011-12-03 NOTE — Anesthesia Postprocedure Evaluation (Signed)
  Anesthesia Post-op Note  Patient: Kristina Adams  Procedure(s) Performed: Procedure(s) (LRB): APPENDECTOMY LAPAROSCOPIC (N/A)  Patient Location: PACU  Anesthesia Type: General  Level of Consciousness: awake, alert , oriented and patient cooperative  Airway and Oxygen Therapy: Patient Spontanous Breathing and Patient connected to face mask oxygen  Post-op Pain: none  Post-op Assessment: Post-op Vital signs reviewed and Patient's Cardiovascular Status Stable  Post-op Vital Signs: Reviewed and stable  Complications: No apparent anesthesia complications

## 2011-12-03 NOTE — ED Provider Notes (Signed)
History     CSN: [id number]  Arrival date & time 12/03/11  1134   First MD Initiated Contact with Patient 12/03/11 1155      Chief Complaint  Patient presents with  . Abdominal Pain    RLQ     The history is provided by the patient.   the patient reports worsening right-sided abdominal pain for 3 days.  Pain is been constant.  Her pain is worse by lying flat and movement of her leg and palpation.  Her pain is moderate to severe at this time.  She presented with a fever of 101.4.  She still has her gallbladder and her appendix.  She reports nausea.  She denies vomiting and diarrhea.  She denies urinary complaints.  She has no flank pain.  Past Medical History  Diagnosis Date  . Diabetes mellitus   . Hypertension   . DDD (degenerative disc disease)     Past Surgical History  Procedure Date  . Cesarean section     No family history on file.  History  Substance Use Topics  . Smoking status: Former Games developer  . Smokeless tobacco: Not on file  . Alcohol Use: 0.6 oz/week    1 Glasses of wine per week    OB History    Grav Para Term Preterm Abortions TAB SAB Ect Mult Living                  Review of Systems  Gastrointestinal: Positive for abdominal pain.  All other systems reviewed and are negative.    Allergies  Iohexol  Home Medications   Current Outpatient Rx  Name Route Sig Dispense Refill  . VITAMIN D 1000 UNITS PO TABS Oral Take 2,000 Units by mouth daily.    Marland Kitchen HYDROCHLOROTHIAZIDE 12.5 MG PO CAPS Oral Take 12.5 mg by mouth daily.    . INSULIN ISOPHANE & REGULAR (70-30) 100 UNIT/ML [place] SUSP Subcutaneous Inject 80 Units into the skin 2 (two) times daily.    Marland Kitchen LISINOPRIL 30 MG PO TABS Oral Take 30 mg by mouth daily.    Marland Kitchen METFORMIN HCL 500 MG PO TABS Oral Take 250 mg by mouth 2 (two) times daily with a meal.    . ADULT MULTIVITAMIN W/MINERALS CH Oral Take 1 tablet by mouth daily.    Marland Kitchen PAROXETINE HCL 20 MG PO TABS Oral Take 20 mg by mouth every morning.    Marland Kitchen  VITAMIN C 500 MG PO TABS Oral Take 1,000 mg by mouth daily.      BP 154/56  Pulse 97  Temp(Src) 101.4 F (38.6 C) (Oral)  Resp 18  Ht 5\' 10"  (1.778 m)  Wt 330 lb (149.687 kg)  BMI 47.35 kg/m2  SpO2 96%  LMP 11/13/2011  Physical Exam  Nursing note and vitals reviewed. Constitutional: She is oriented to person, place, and time. She appears well-developed and well-nourished. No distress.  HENT:  Head: Normocephalic and atraumatic.  Eyes: EOM are normal.  Neck: Normal range of motion.  Cardiovascular: Normal rate, regular rhythm and normal heart sounds.   Pulmonary/Chest: Effort normal and breath sounds normal.  Abdominal: Soft. She exhibits no distension.       Tenderness with guarding in the right lower and right lateral abdomen.  No frank peritonitis on exam.  Obese abdomen  Musculoskeletal: Normal range of motion.  Neurological: She is alert and oriented to person, place, and time.  Skin: Skin is warm and dry.  Psychiatric: She has a normal mood  and affect. Judgment normal.    ED Course  Procedures (including critical care time)  Labs Reviewed  CBC - Abnormal; Notable for the following:    WBC 24.3 (*)    Hemoglobin 9.8 (*)    HCT 31.5 (*)    MCH 25.0 (*)    All other components within normal limits  COMPREHENSIVE METABOLIC PANEL - Abnormal; Notable for the following:    Sodium 133 (*)    Glucose, Bld 194 (*)    Albumin 3.3 (*)    All other components within normal limits  LIPASE, BLOOD - Abnormal; Notable for the following:    Lipase 9 (*)    All other components within normal limits  URINALYSIS, ROUTINE W REFLEX MICROSCOPIC  PREGNANCY, URINE   Ct Abdomen Pelvis Wo Contrast  12/03/2011  *RADIOLOGY REPORT*  Clinical Data: 46 year old with a right lower quadrant abdominal pain.  CT ABDOMEN AND PELVIS WITHOUT CONTRAST  Technique:  Multidetector CT imaging of the abdomen and pelvis was performed following the standard protocol without intravenous contrast.   Comparison: None.  Findings: Lung bases are clear. There are extensive inflammatory changes in the right lower quadrant of the abdomen.  There is a large gas-filled tubular structure in the right lower quadrant which is presumed to represent the appendix.  There are small locules of air along the tip the appendix and just below the appendix.  The findings are suggestive for a perforated appendix. There is a small amount of free fluid in the pelvis.  Multiple layering gallstones in the gallbladder.  No gross abnormality to the liver, spleen, pancreas, adrenal glands and both kidneys.  There is a 0.8 cm low density structure involving the medial posterior right kidney.  The structure has fat density and suspect a small angiomyolipoma.  There may be another small angiomyolipoma along the anterior right kidney lower pole.  The left side of the uterus and left adnexa are prominent and these areas are poorly visualized on this noncontrast examination. Findings could be related to fibroid disease.  Marked sclerosis involving the lateral aspect of the SI joints bilaterally. Degenerative facet changes in the lower lumbar spine.  IMPRESSION: Inflammatory changes in the right lower quadrant of the abdomen appear to be secondary to a perforated appendix.  The tubular gas- filled structure most likely represents an appendix and there is adjacent extraluminal air.  There is no significant drainable abscess collection at this time.  Please note that these areas could be better characterized with postcontrast CT imaging.  Small amount of free fluid in the pelvis.  Nodular appearance of the uterus and left adnexa may be associated with fibroid disease but poorly characterized on this examination.  Cholelithiasis.  Two low density structures in the right kidney.  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 The smaller structure could represent an angiomyolipoma or cyst.  These results were called by telephone on  12/03/2011  at  2:54 p.m. to  Dr. Patria Mane, who verbally acknowledged these results.  Original Report Authenticated By: Richarda Overlie, M.D.     1. Perforated appendicitis       MDM  The patient has evidence of a perforated appendicitis.  She's been given a gram of Invanz at this time.  I'll discuss the case with general surgery for admission.  She is a diabetic        Lyanne Co, MD 12/03/11 1505

## 2011-12-03 NOTE — Brief Op Note (Signed)
12/03/2011  9:03 PM  PATIENT:  Kristina Adams  46 y.o. female  PRE-OPERATIVE DIAGNOSIS:  acute appendicitis  POST-OPERATIVE DIAGNOSIS:  acute appendicitis  PROCEDURE:  Procedure(s) (LRB): APPENDECTOMY LAPAROSCOPIC (N/A)  SURGEON:  Surgeon(s) and Role:        * Lodema Pilot, DO - Primary  PHYSICIAN ASSISTANT:   ASSISTANTS: none   ANESTHESIA:   general  EBL:  Total I/O In: 1900 [I.V.:1800; Other:100] Out: 450 [Urine:300; Drains:100; Blood:50]  BLOOD ADMINISTERED:none  DRAINS: (41F x2) Jackson-Pratt drain(s) with closed bulb suction in the periappendiceal area   LOCAL MEDICATIONS USED:  MARCAINE    and LIDOCAINE   SPECIMEN:  Source of Specimen:  appendix  DISPOSITION OF SPECIMEN:  PATHOLOGY  COUNTS:  YES  TOURNIQUET:  * No tourniquets in log *  DICTATION: .Other Dictation: Dictation Number   PLAN OF CARE: Admit to inpatient   PATIENT DISPOSITION:  PACU - hemodynamically stable.   Delay start of Pharmacological VTE agent (>24hrs) due to surgical blood loss or risk of bleeding: no

## 2011-12-03 NOTE — ED Notes (Addendum)
Pt states "been having pain in my right lower abdomen, has gotten progressively worse, last BM was Wednesday, thought it was gas, last pap smear was last August, have had some nausea"

## 2011-12-03 NOTE — Anesthesia Preprocedure Evaluation (Addendum)
Anesthesia Evaluation  Patient identified by MRN, date of birth, ID band Patient awake    Reviewed: Allergy & Precautions, H&P , NPO status , Patient's Chart, lab work & pertinent test results  Airway Mallampati: II TM Distance: <3 FB Neck ROM: Full    Dental No notable dental hx.    Pulmonary Current Smoker,  breath sounds clear to auscultation  Pulmonary exam normal       Cardiovascular hypertension, Pt. on medications Rhythm:Regular Rate:Normal     Neuro/Psych Depression negative neurological ROS     GI/Hepatic negative GI ROS, Neg liver ROS,   Endo/Other  Diabetes mellitus-, Insulin DependentHyperthyroidism Morbid obesity  Renal/GU negative Renal ROS  negative genitourinary   Musculoskeletal negative musculoskeletal ROS (+)   Abdominal (+) + obese,   Peds negative pediatric ROS (+)  Hematology negative hematology ROS (+)   Anesthesia Other Findings   Reproductive/Obstetrics negative OB ROS                         Anesthesia Physical Anesthesia Plan  ASA: III and Emergent  Anesthesia Plan: General   Post-op Pain Management:    Induction: Intravenous and Rapid sequence  Airway Management Planned: Oral ETT  Additional Equipment:   Intra-op Plan:   Post-operative Plan: Extubation in OR  Informed Consent: I have reviewed the patients History and Physical, chart, labs and discussed the procedure including the risks, benefits and alternatives for the proposed anesthesia with the patient or authorized representative who has indicated his/her understanding and acceptance.   Dental advisory given  Plan Discussed with: CRNA  Anesthesia Plan Comments:         Anesthesia Quick Evaluation

## 2011-12-03 NOTE — Progress Notes (Signed)
pcp is Dr.Charity Delaine Lame, [place]

## 2011-12-03 NOTE — H&P (Signed)
Kristina Adams is an 46 y.o. female.    Primary Care: Dr.Charity Delaine Lame, [place] Chief Complaint: Progressive abdominal pain and fever. HPI: Patient is a 46 year old female who reports she developed pain Wednesday afternoon 12/01/11, after lunch around 11:30. She thought was gas improvement nor. Thursday she had continued pain has become progressively worse. She's had nausea with it but no vomiting. She had some diarrhea was the evening her symptoms started, no bowel movement since. She denies vomiting diarrhea or constipation since then her last meal was at 10 PM last night. She presented to the ER with a temperature of 101.4. Labs show a white count of 24,000, CT scan shows inflammatory changes in the right lower quadrant consistent with a perforated appendix. There is tubular gas filled structure which most likely represents the appendix and there is an adjacent extraluminal air. No drainable abscess was noted. We are asked to the patient consultation and for treatment.  Past Medical History  Diagnosis Date  . Diabetes mellitus   . Hypertension   . DDD (degenerative disc disease)   . Sleep apnea 12/03/2011  . Chronic back pain 12/03/2011  . Hypertension 12/03/2011  . Appendicitis, acute 12/03/2011  . Diabetes mellitus type 2, insulin dependent 12/03/2011  . Morbid obesity with BMI of 45.0-49.9, adult 12/03/2011    Past Surgical History  Procedure Date  . Cesarean section 1990 and 1994      History reviewed. No pertinent family history. Social History:  reports that she has quit smoking. She has never used smokeless tobacco. She reports that she drinks about .6 ounces of alcohol per week. She reports that she does not use illicit drugs. she smoked 4 years quit in her 33s. Alcohol: Social. Drugs: None Divorced  Allergies:  Allergies  Allergen Reactions  . Iohexol      Code: RASH, Desc: PATIENT CALLED 06/17/08-STATED AFTER 6 HOURS POST IV CONTRAST, HANDS,SCALP AND GROIN AREA DEVELOPED  ITCHING,BURNING SENSATION, Onset Date: [id number]    Prior to Admission medications   Medication Sig Start Date End Date Taking? Authorizing Provider  cholecalciferol (VITAMIN D) 1000 UNITS tablet Take 2,000 Units by mouth daily.   Yes Historical Provider, MD  hydrochlorothiazide (MICROZIDE) 12.5 MG capsule Take 12.5 mg by mouth daily.   Yes Historical Provider, MD  insulin NPH-insulin regular (NOVOLIN 70/30) (70-30) 100 UNIT/ML injection Inject 80 Units into the skin 2 (two) times daily.   Yes Historical Provider, MD  lisinopril (PRINIVIL,ZESTRIL) 30 MG tablet Take 30 mg by mouth daily.   Yes Historical Provider, MD  metFORMIN (GLUCOPHAGE) 500 MG tablet Take 250 mg by mouth 2 (two) times daily with a meal.   Yes Historical Provider, MD  Multiple Vitamin (MULITIVITAMIN WITH MINERALS) TABS Take 1 tablet by mouth daily.   Yes Historical Provider, MD  PARoxetine (PAXIL) 20 MG tablet Take 20 mg by mouth every morning.   Yes Historical Provider, MD  vitamin C (ASCORBIC ACID) 500 MG tablet Take 1,000 mg by mouth daily.   Yes Historical Provider, MD     (Not in a hospital admission)  Results for orders placed during the hospital encounter of 12/03/11 (from the past 48 hour(s))  CBC     Status: Abnormal   Collection Time   12/03/11  1:10 PM      Component Value Range Comment   WBC 24.3 (*) 4.0 - 10.5 (K/uL)    RBC 3.92  3.87 - 5.11 (MIL/uL)    Hemoglobin 9.8 (*) 12.0 - 15.0 (g/dL)  HCT 31.5 (*) 36.0 - 46.0 (%)    MCV 80.4  78.0 - 100.0 (fL)    MCH 25.0 (*) 26.0 - 34.0 (pg)    MCHC 31.1  30.0 - 36.0 (g/dL)    RDW 21.3  08.6 - 57.8 (%)    Platelets 353  150 - 400 (K/uL)   COMPREHENSIVE METABOLIC PANEL     Status: Abnormal   Collection Time   12/03/11  1:10 PM      Component Value Range Comment   Sodium 133 (*) 135 - 145 (mEq/L)    Potassium 3.8  3.5 - 5.1 (mEq/L)    Chloride 97  96 - 112 (mEq/L)    CO2 24  19 - 32 (mEq/L)    Glucose, Bld 194 (*) 70 - 99 (mg/dL)    BUN 10  6 - 23 (mg/dL)     Creatinine, Ser 4.69  0.50 - 1.10 (mg/dL)    Calcium 8.5  8.4 - 10.5 (mg/dL)    Total Protein 7.3  6.0 - 8.3 (g/dL)    Albumin 3.3 (*) 3.5 - 5.2 (g/dL)    AST 8  0 - 37 (U/L)    ALT 6  0 - 35 (U/L)    Alkaline Phosphatase 64  39 - 117 (U/L)    Total Bilirubin 0.4  0.3 - 1.2 (mg/dL)    GFR calc non Af Amer >90  >90 (mL/min)    GFR calc Af Amer >90  >90 (mL/min)   LIPASE, BLOOD     Status: Abnormal   Collection Time   12/03/11  1:10 PM      Component Value Range Comment   Lipase 9 (*) 11 - 59 (U/L)    Ct Abdomen Pelvis Wo Contrast  12/03/2011  *RADIOLOGY REPORT*  Clinical Data: 46 year old with a right lower quadrant abdominal pain.  CT ABDOMEN AND PELVIS WITHOUT CONTRAST  Technique:  Multidetector CT imaging of the abdomen and pelvis was performed following the standard protocol without intravenous contrast.  Comparison: None.  Findings: Lung bases are clear. There are extensive inflammatory changes in the right lower quadrant of the abdomen.  There is a large gas-filled tubular structure in the right lower quadrant which is presumed to represent the appendix.  There are small locules of air along the tip the appendix and just below the appendix.  The findings are suggestive for a perforated appendix. There is a small amount of free fluid in the pelvis.  Multiple layering gallstones in the gallbladder.  No gross abnormality to the liver, spleen, pancreas, adrenal glands and both kidneys.  There is a 0.8 cm low density structure involving the medial posterior right kidney.  The structure has fat density and suspect a small angiomyolipoma.  There may be another small angiomyolipoma along the anterior right kidney lower pole.  The left side of the uterus and left adnexa are prominent and these areas are poorly visualized on this noncontrast examination. Findings could be related to fibroid disease.  Marked sclerosis involving the lateral aspect of the SI joints bilaterally. Degenerative facet changes  in the lower lumbar spine.  IMPRESSION: Inflammatory changes in the right lower quadrant of the abdomen appear to be secondary to a perforated appendix.  The tubular gas- filled structure most likely represents an appendix and there is adjacent extraluminal air.  There is no significant drainable abscess collection at this time.  Please note that these areas could be better characterized with postcontrast CT imaging.  Small amount of  free fluid in the pelvis.  Nodular appearance of the uterus and left adnexa may be associated with fibroid disease but poorly characterized on this examination.  Cholelithiasis.  Two low density structures in the right kidney.  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 The smaller structure could represent an angiomyolipoma or cyst.  These results were called by telephone on 12/03/2011  at  2:54 p.m. to  Dr. Patria Mane, who verbally acknowledged these results.  Original Report Authenticated By: Richarda Overlie, M.D.    Review of Systems  Constitutional: Positive for fever (101.4). Negative for chills, weight loss, malaise/fatigue and diaphoresis.  HENT: Negative for neck pain.   Eyes: Negative.   Respiratory: Positive for wheezing (occasional expecially at night). Negative for cough, hemoptysis, sputum production and shortness of breath.        Recent DX Sleep apnea, has not been prescribed CPAP or BiPAP at this point.  Cardiovascular: Positive for leg swelling.  Gastrointestinal: Positive for heartburn, nausea and abdominal pain (RIGHT SIDE). Negative for vomiting.  Genitourinary: Positive for dysuria.       JUST NOTED SOME DYSURIA AND ORDOR  Musculoskeletal: Positive for back pain (CHRONIC BACK PAIN). Negative for myalgias, joint pain and falls.  Neurological: Negative.  Negative for weakness.  Endo/Heme/Allergies: Negative.   Psychiatric/Behavioral: Positive for depression.    Blood pressure 154/56, pulse 97, temperature 101.4 F (38.6 C), temperature  source Oral, resp. rate 18, height 5\' 10"  (1.778 m), weight 149.687 kg (330 lb), last menstrual period 11/13/2011, SpO2 96.00%. Physical Exam  Constitutional: She is oriented to person, place, and time. She appears well-developed and well-nourished.       bmi 49 IN 08/2010  HENT:  Head: Normocephalic and atraumatic.  Mouth/Throat: No oropharyngeal exudate.  Eyes: Conjunctivae and EOM are normal. Pupils are equal, round, and reactive to light. Right eye exhibits no discharge. Left eye exhibits no discharge. No scleral icterus.  Neck: Normal range of motion. Neck supple. No JVD present. No tracheal deviation present. No thyromegaly present.  Cardiovascular: Normal rate, regular rhythm, normal heart sounds and intact distal pulses.  Exam reveals no gallop.   No murmur heard. Respiratory: Effort normal and breath sounds normal. No stridor. No respiratory distress. She has no wheezes. She has no rales. She exhibits no tenderness.  GI: Soft. Bowel sounds are normal. She exhibits no distension and no mass. There is tenderness (RLQ less so RUQ). There is guarding (just a little). There is no rebound.  Musculoskeletal: She exhibits no edema and no tenderness.  Lymphadenopathy:    She has no cervical adenopathy.  Neurological: She is alert and oriented to person, place, and time. She has normal reflexes. No cranial nerve deficit.  Skin: Skin is warm and dry. No rash noted. No erythema. No pallor.  Psychiatric: She has a normal mood and affect. Her behavior is normal. Thought content normal.     Assessment/Plan 1. Acute appendicitis with probable rupture.. 2. Distal dependent diabetes 3. Hypertension 4. BMI of 49 5. GERD 6. 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 7. History of depression.   Plan: Patient be admitted will start her on IV antibiotics and plan surgery later today.     Abriel Hattery 12/03/2011, 4:33 PM

## 2011-12-03 NOTE — ED Notes (Addendum)
Pt c/o RLQ abdominal pain, sharp, piercing and warm to touch, feels like something is pulling with nausea and headache.  Denies vomiting and diarrhea. Pain unrelieved with laxative.  Hasn't had a bowel movement since 330p Wednesday.  Pt is febrile; 101.4.

## 2011-12-03 NOTE — ED Notes (Signed)
Pt unable to void on bedside commode 

## 2011-12-03 NOTE — H&P (Signed)
Pt seen examined and test reviewed.  Acute appendicitis.  Dr Biagio Quint on call and will follow up.  She will need appendectomy later tonight.

## 2011-12-03 NOTE — ED Notes (Signed)
Patient transported to CT 

## 2011-12-03 NOTE — Transfer of Care (Signed)
Immediate Anesthesia Transfer of Care Note  Patient: Kristina Adams  Procedure(s) Performed: Procedure(s) (LRB): APPENDECTOMY LAPAROSCOPIC (N/A)  Patient Location: PACU  Anesthesia Type: General  Level of Consciousness: awake, alert , oriented and patient cooperative  Airway & Oxygen Therapy: Patient Spontanous Breathing and Patient connected to face mask oxygen  Post-op Assessment: Report given to PACU RN and Post -op Vital signs reviewed and stable  Post vital signs: Reviewed and stable  Complications: No apparent anesthesia complications

## 2011-12-04 LAB — BASIC METABOLIC PANEL
BUN: 12 mg/dL (ref 6–23)
CO2: 24 mEq/L (ref 19–32)
Calcium: 7.8 mg/dL — ABNORMAL LOW (ref 8.4–10.5)
Creatinine, Ser: 0.94 mg/dL (ref 0.50–1.10)
Glucose, Bld: 250 mg/dL — ABNORMAL HIGH (ref 70–99)

## 2011-12-04 LAB — CBC
MCH: 25.4 pg — ABNORMAL LOW (ref 26.0–34.0)
MCV: 81.2 fL (ref 78.0–100.0)
Platelets: 344 10*3/uL (ref 150–400)
RDW: 15.4 % (ref 11.5–15.5)

## 2011-12-04 MED ORDER — DIPHENHYDRAMINE HCL 50 MG/ML IJ SOLN
12.5000 mg | Freq: Four times a day (QID) | INTRAMUSCULAR | Status: DC | PRN
  Administered 2011-12-04 – 2011-12-07 (×13): 12.5 mg via INTRAVENOUS
  Filled 2011-12-04 (×13): qty 1

## 2011-12-04 MED ORDER — SODIUM CHLORIDE 0.9 % IV BOLUS (SEPSIS)
1000.0000 mL | Freq: Once | INTRAVENOUS | Status: AC
  Administered 2011-12-04: 1000 mL via INTRAVENOUS

## 2011-12-04 NOTE — Progress Notes (Signed)
Temp 100.6 tylenol given Annitta Needs, RN

## 2011-12-04 NOTE — Progress Notes (Signed)
1 Day Post-Op  Subjective: Feels okay but febrile overnight  Objective: Vital signs in last 24 hours: Temp:  [98.4 F (36.9 C)-102.9 F (39.4 C)] 98.4 F (36.9 C) (05/25 0858) Pulse Rate:  [82-100] 82  (05/25 0858) Resp:  [14-23] 20  (05/25 0858) BP: (94-176)/(46-65) 94/57 mmHg (05/25 0858) SpO2:  [95 %-100 %] 96 % (05/25 0858) Weight:  [330 lb (149.687 kg)] 330 lb (149.687 kg) (05/24 1137)    Intake/Output from previous day: 05/24 0701 - 05/25 0700 In: 2679.2 [I.V.:2579.2] Out: 1170 [Urine:850; Drains:270; Blood:50] Intake/Output this shift:    General appearance: alert, cooperative and no distress Resp: clear to auscultation bilaterally Cardio: regular rate and rhythm, S1, S2 normal, no murmur, click, rub or gallop GI: soft, mild diffuse tenderness but greatest on right, ND, JP's appropriate  Lab Results:   Basename 12/04/11 0423 12/03/11 1310  WBC 24.3* 24.3*  HGB 9.2* 9.8*  HCT 29.4* 31.5*  PLT 344 353   BMET  Basename 12/04/11 0423 12/03/11 1310  NA 132* 133*  K 4.0 3.8  CL 98 97  CO2 24 24  GLUCOSE 250* 194*  BUN 12 10  CREATININE 0.94 0.70  CALCIUM 7.8* 8.5   PT/INR No results found for this basename: LABPROT:2,INR:2 in the last 72 hours ABG No results found for this basename: PHART:2,PCO2:2,PO2:2,HCO3:2 in the last 72 hours  Studies/Results: Ct Abdomen Pelvis Wo Contrast  12/03/2011  *RADIOLOGY REPORT*  Clinical Data: 46 year old with a right lower quadrant abdominal pain.  CT ABDOMEN AND PELVIS WITHOUT CONTRAST  Technique:  Multidetector CT imaging of the abdomen and pelvis was performed following the standard protocol without intravenous contrast.  Comparison: None.  Findings: Lung bases are clear. There are extensive inflammatory changes in the right lower quadrant of the abdomen.  There is a large gas-filled tubular structure in the right lower quadrant which is presumed to represent the appendix.  There are small locules of air along the tip the  appendix and just below the appendix.  The findings are suggestive for a perforated appendix. There is a small amount of free fluid in the pelvis.  Multiple layering gallstones in the gallbladder.  No gross abnormality to the liver, spleen, pancreas, adrenal glands and both kidneys.  There is a 0.8 cm low density structure involving the medial posterior right kidney.  The structure has fat density and suspect a small angiomyolipoma.  There may be another small angiomyolipoma along the anterior right kidney lower pole.  The left side of the uterus and left adnexa are prominent and these areas are poorly visualized on this noncontrast examination. Findings could be related to fibroid disease.  Marked sclerosis involving the lateral aspect of the SI joints bilaterally. Degenerative facet changes in the lower lumbar spine.  IMPRESSION: Inflammatory changes in the right lower quadrant of the abdomen appear to be secondary to a perforated appendix.  The tubular gas- filled structure most likely represents an appendix and there is adjacent extraluminal air.  There is no significant drainable abscess collection at this time.  Please note that these areas could be better characterized with postcontrast CT imaging.  Small amount of free fluid in the pelvis.  Nodular appearance of the uterus and left adnexa may be associated with fibroid disease but poorly characterized on this examination.  Cholelithiasis.  Two low density structures in the right kidney.  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 The smaller structure could represent an angiomyolipoma or cyst.  These results  were called by telephone on 12/03/2011  at  2:54 p.m. to  Dr. Patria Mane, who verbally acknowledged these results.  Original Report Authenticated By: Richarda Overlie, M.D.    Anti-infectives: Anti-infectives     Start     Dose/Rate Route Frequency Ordered Stop   12/03/11 2200   ciprofloxacin (CIPRO) IVPB 400 mg        400 mg 200 mL/hr over  60 Minutes Intravenous Every 12 hours 12/03/11 2130     12/03/11 2200   metroNIDAZOLE (FLAGYL) IVPB 500 mg        500 mg 100 mL/hr over 60 Minutes Intravenous Every 8 hours 12/03/11 2130     12/03/11 1800   ertapenem (INVANZ) 1 g in sodium chloride 0.9 % 50 mL IVPB        1 g 100 mL/hr over 30 Minutes Intravenous Every 24 hours 12/03/11 1648     12/03/11 1500   ertapenem (INVANZ) 1 g in sodium chloride 0.9 % 50 mL IVPB        1 g 100 mL/hr over 30 Minutes Intravenous  Once 12/03/11 1456 12/03/11 1542          Assessment/Plan: s/p Procedure(s) (LRB): APPENDECTOMY LAPAROSCOPIC (N/A) seems to be doing okay given her severe appendicitis.  Fevers are expected with this perforated appy.  I would expect a postop ileus as well so will go slow with diet.  Urine dark so will give some more fluid and keep foley to monitor output  LOS: 1 day    Kristina Adams DAVID 12/04/2011

## 2011-12-04 NOTE — Progress Notes (Signed)
Contacted dr Dwain Sarna regarding pt urine output of 400 for the day with a bolus of 1000 over two hours this am. Pt urine dark colored. Pt has foley. Pt requested clear liquid diet. Pt request benadryl for itching. Dr Dwain Sarna ordered another bolus, sips of fluid no clear liquid diet and benadryl for itching. Pt is to remain on foley. Annitta Needs, RN

## 2011-12-04 NOTE — Op Note (Signed)
NAMEPARTICIA, Kristina Adams                 ACCOUNT NO.:  [id number]  MEDICAL RECORD NO.:  [id number]  LOCATION:  1522                         FACILITY:  [facility]  PHYSICIAN:  Lodema Pilot, MD       DATE OF BIRTH:  04-03-1966  DATE OF PROCEDURE:  12/03/2011 DATE OF DISCHARGE:                              OPERATIVE REPORT   PROCEDURE:  Laparoscopic appendectomy for perforated appendicitis with drainage of intra-abdominal abscess.  PREOPERATIVE DIAGNOSIS:  Perforated appendicitis.  POSTOPERATIVE DIAGNOSIS:  Perforated appendicitis.  SURGEON:  Dr. Biagio Quint.  ASSISTANT:  None.  ANESTHESIA:  General endotracheal tube anesthesia with 40 mL of 1% lidocaine with epinephrine, 0.25% Marcaine in a 50/50 mixture.  FLUIDS:  800 mL crystalloid.  ESTIMATED BLOOD LOSS:  Minimal.  DRAINS:  19-French Blake drains x2 placed in the area of the right lower quadrant abscess cavities and the left lower quadrant drain, near the appendiceal stump.  SPECIMEN:  Appendix sent to Pathology for permanent sectioning.  COMPLICATIONS:  None apparent.  FINDINGS:  Perforated appendicitis with abscess collection and necrotic appendix.  INDICATION FOR PROCEDURE:  Kristina Adams is a 46 year old female with a 2-day history of increasing abdominal pain.  She comes in with elevated white blood cell count and a palpable right lower quadrant mass and findings on CT scan concerning for perforated appendicitis.  OPERATIVE DETAILS:  Kristina Adams was seen and evaluated in the emergency room, and in the preop area.  The risks and benefits of procedure were discussed in lay terms.  Informed consent was obtained.  She was given therapeutic antibiotics and taken to the operating room, placed on table in supine position with the left arm tucked.  Foley catheter was placed. The abdomen was prepped and draped in a standard surgical fashion. Procedure time-out was performed with all operative team members to confirm proper patient,  procedure.  A supraumbilical midline incision was made in the skin and dissection carried down through the subcutaneous tissue using blunt dissection. The abdominal wall fascia was elevated and sharply incised, and the peritoneum entered under direct visualization.  Two 0 Vicryl sutures were placed through the fascia at this time and the 12-mm balloon port was placed in the abdomen and pneumoperitoneum was obtained. Laparoscope was introduced and she was immediately noted to have small bowel and colon adhered to the right lower quadrant abdominal wall with some fibrinous changes in the left lower quadrant.  A 5-mm trocar was placed under direct visualization, and an upper midline 5-mm port was placed as well.  This was eventually up sized to a 12-mm port for the stapling.  I was able to bluntly separate the small bowel and colon to the abdominal wall, and immediately entered an abscess cavity in the right lower quadrant and pelvis.  Purulent material was suctioned.  All of the small bowel, the terminal ileum in the area was thickened and adhered to the to the area.  I was able to gently tease away the interloop adhesions and identified a very thick and necrotic appearing appendix.  I elevated the appendix and dissected this down to the to the base. Harmonic scalpel was used to divide the mesoappendix close  to the appendix and was able to dissect this down into the base.  The entire appendix was necrotic, but there was a small stump near the base that was healthy-appearing appendix at the cecum.  I was able to get enough of the healthy tissue where I felt that this would adequately hold staples.  I upsized the upper midline trocar to a 12-mm port to facilitate the stapling to the angle, and the Endo-GIA blue load stapler was used to transect the appendix at its base.  The appendix was placed in an EndoCatch bag, but not removed at this time.  The remainder of the fluid was suctioned and  irrigated from the abdomen.  I also placed a 2-0 Vicryl stitch oversewing some fat to what appeared to be some epiploic appendage to the mesoappendix to cover up the staple line.  Then I placed a 19-French Blake drain through a separate stab incision in the right lower quadrant.  This was placed in the area of the abscess cavity along the pelvic sidewall and another 19-French Harrison Mons drain was placed in the left lower quadrant through the trocar, and placed near the staple line.  The appendix was then removed from the umbilical trocar site and passed off the table and sent to pathology for permanent section.  Additional Vicryl sutures were placed at the umbilicus to approximate the fascia.  Sutures were secured and the abdomen was re-insufflated with carbon dioxide gas to investigate the abdominal closure, which was noted be adequate and no evidence of bowel injury.  The final 12-mm trocar was removed and the wounds were irrigated with sterile saline solution, and skin edges were approximated with interrupted 4-0 Monocryl sutures and benzoin and Steri-Strips were applied.  The drains were sutured in place with nylon drain stitch and sterile dressings were applied.  All sponge, needle, instrument counts were correct at the end of the case.  The patient tolerated procedure well without apparent complications.          ______________________________ Lodema Pilot, MD     BL/MEDQ  D:  12/04/2011  T:  12/04/2011  Job:  [id number]

## 2011-12-04 NOTE — Progress Notes (Signed)
Pt IV sore and cold to touch. Attempted 2 times paged IV team. Annitta Needs, RN

## 2011-12-05 LAB — BASIC METABOLIC PANEL
Calcium: 7.7 mg/dL — ABNORMAL LOW (ref 8.4–10.5)
Creatinine, Ser: 1.24 mg/dL — ABNORMAL HIGH (ref 0.50–1.10)
GFR calc Af Amer: 60 mL/min — ABNORMAL LOW (ref >90)

## 2011-12-05 LAB — DIFFERENTIAL
Basophils Absolute: 0 10*3/uL (ref 0.0–0.1)
Basophils Relative: 0 % (ref 0–1)
Eosinophils Relative: 1 % (ref 0–5)
Lymphocytes Relative: 9 % — ABNORMAL LOW (ref 12–46)

## 2011-12-05 LAB — CBC
MCHC: 31.3 g/dL (ref 30.0–36.0)
MCV: 81.2 fL (ref 78.0–100.0)
Platelets: 306 10*3/uL (ref 150–400)
RDW: 15.4 % (ref 11.5–15.5)
WBC: 17.6 10*3/uL — ABNORMAL HIGH (ref 4.0–10.5)

## 2011-12-05 MED ORDER — VITAMINS A & D EX OINT
TOPICAL_OINTMENT | CUTANEOUS | Status: AC
  Filled 2011-12-05: qty 10

## 2011-12-05 NOTE — Progress Notes (Signed)
Pt c/o severe gas pains.  Up walking in hall w/ standby asst.  Pt belching but passing very little gas from rectum.  Dr. Biagio Quint notified.  No orders received.

## 2011-12-05 NOTE — Progress Notes (Signed)
2 Days Post-Op  Subjective: No complaints. Asking for food  Objective: Vital signs in last 24 hours: Temp:  [98.6 F (37 C)-100.7 F (38.2 C)] 98.7 F (37.1 C) (05/26 0948) Pulse Rate:  [75-89] 84  (05/26 0948) Resp:  [16-20] 16  (05/26 0948) BP: (93-144)/(46-70) 93/60 mmHg (05/26 0948) SpO2:  [94 %-100 %] 98 % (05/26 0948) Last BM Date: 11/29/11  Intake/Output from previous day: 05/25 0701 - 05/26 0700 In: 4131.3 [I.V.:3031.3; IV Piggyback:1100] Out: 930 [Urine:550; Drains:380] Intake/Output this shift:    General appearance: alert, cooperative and no distress Resp: nonlabored Cardio: regular rate and rhythm, S1, S2 normal, no murmur, click, rub or gallop GI: soft, minimal tenderness, mild distension and some tympany, wounds okay, JPs serous.  high pitched tinkles  Lab Results:   Basename 12/05/11 0640 12/04/11 0423  WBC 17.6* 24.3*  HGB 8.1* 9.2*  HCT 25.9* 29.4*  PLT 306 344   BMET  Basename 12/05/11 0640 12/04/11 0423  NA 131* 132*  K 4.5 4.0  CL 102 98  CO2 22 24  GLUCOSE 217* 250*  BUN 27* 12  CREATININE 1.24* 0.94  CALCIUM 7.7* 7.8*   PT/INR No results found for this basename: LABPROT:2,INR:2 in the last 72 hours ABG No results found for this basename: PHART:2,PCO2:2,PO2:2,HCO3:2 in the last 72 hours  Studies/Results: Ct Abdomen Pelvis Wo Contrast  12/03/2011  *RADIOLOGY REPORT*  Clinical Data: 46 year old with a right lower quadrant abdominal pain.  CT ABDOMEN AND PELVIS WITHOUT CONTRAST  Technique:  Multidetector CT imaging of the abdomen and pelvis was performed following the standard protocol without intravenous contrast.  Comparison: None.  Findings: Lung bases are clear. There are extensive inflammatory changes in the right lower quadrant of the abdomen.  There is a large gas-filled tubular structure in the right lower quadrant which is presumed to represent the appendix.  There are small locules of air along the tip the appendix and just below the  appendix.  The findings are suggestive for a perforated appendix. There is a small amount of free fluid in the pelvis.  Multiple layering gallstones in the gallbladder.  No gross abnormality to the liver, spleen, pancreas, adrenal glands and both kidneys.  There is a 0.8 cm low density structure involving the medial posterior right kidney.  The structure has fat density and suspect a small angiomyolipoma.  There may be another small angiomyolipoma along the anterior right kidney lower pole.  The left side of the uterus and left adnexa are prominent and these areas are poorly visualized on this noncontrast examination. Findings could be related to fibroid disease.  Marked sclerosis involving the lateral aspect of the SI joints bilaterally. Degenerative facet changes in the lower lumbar spine.  IMPRESSION: Inflammatory changes in the right lower quadrant of the abdomen appear to be secondary to a perforated appendix.  The tubular gas- filled structure most likely represents an appendix and there is adjacent extraluminal air.  There is no significant drainable abscess collection at this time.  Please note that these areas could be better characterized with postcontrast CT imaging.  Small amount of free fluid in the pelvis.  Nodular appearance of the uterus and left adnexa may be associated with fibroid disease but poorly characterized on this examination.  Cholelithiasis.  Two low density structures in the right kidney.  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 The smaller structure could represent an angiomyolipoma or cyst.  These results were called by telephone on 12/03/2011  at  2:54 p.m. to  Dr. Patria Mane, who verbally acknowledged these results.  Original Report Authenticated By: Richarda Overlie, M.D.    Anti-infectives: Anti-infectives     Start     Dose/Rate Route Frequency Ordered Stop   12/03/11 2200   ciprofloxacin (CIPRO) IVPB 400 mg        400 mg 200 mL/hr over 60 Minutes Intravenous  Every 12 hours 12/03/11 2130     12/03/11 2200   metroNIDAZOLE (FLAGYL) IVPB 500 mg        500 mg 100 mL/hr over 60 Minutes Intravenous Every 8 hours 12/03/11 2130     12/03/11 1800   ertapenem (INVANZ) 1 g in sodium chloride 0.9 % 50 mL IVPB        1 g 100 mL/hr over 30 Minutes Intravenous Every 24 hours 12/03/11 1648     12/03/11 1500   ertapenem (INVANZ) 1 g in sodium chloride 0.9 % 50 mL IVPB        1 g 100 mL/hr over 30 Minutes Intravenous  Once 12/03/11 1456 12/03/11 1542          Assessment/Plan: s/p Procedure(s) (LRB): APPENDECTOMY LAPAROSCOPIC (N/A) she is improving and her pain is appropriate.  I think that she is developing a postopo ileus which was expected.  I recommended that she remain NPO until she has some sign of bowel function.  Continue IV abx and mobilize.  LOS: 2 days    Lodema Pilot DAVID 12/05/2011

## 2011-12-06 LAB — GLUCOSE, CAPILLARY
Glucose-Capillary: 161 mg/dL — ABNORMAL HIGH (ref 70–99)
Glucose-Capillary: 162 mg/dL — ABNORMAL HIGH (ref 70–99)
Glucose-Capillary: 170 mg/dL — ABNORMAL HIGH (ref 70–99)
Glucose-Capillary: 177 mg/dL — ABNORMAL HIGH (ref 70–99)
Glucose-Capillary: 189 mg/dL — ABNORMAL HIGH (ref 70–99)
Glucose-Capillary: 240 mg/dL — ABNORMAL HIGH (ref 70–99)

## 2011-12-06 LAB — BASIC METABOLIC PANEL
CO2: 23 mEq/L (ref 19–32)
GFR calc non Af Amer: 69 mL/min — ABNORMAL LOW (ref >90)
Glucose, Bld: 181 mg/dL — ABNORMAL HIGH (ref 70–99)
Potassium: 4.2 mEq/L (ref 3.5–5.1)
Sodium: 135 mEq/L (ref 135–145)

## 2011-12-06 LAB — CBC
Hemoglobin: 8.3 g/dL — ABNORMAL LOW (ref 12.0–15.0)
MCH: 25.1 pg — ABNORMAL LOW (ref 26.0–34.0)
MCV: 80.7 fL (ref 78.0–100.0)
RBC: 3.31 MIL/uL — ABNORMAL LOW (ref 3.87–5.11)
WBC: 12.4 10*3/uL — ABNORMAL HIGH (ref 4.0–10.5)

## 2011-12-06 NOTE — Progress Notes (Signed)
Patient is alert and oriented, vital signs are stable, pt medicated for abd pain with 2 tabs of norco twice this shift, benadryl 12.5mg  IV given for itching, patient did not want to walk in the hallway today but did get up to the bathroom and chair, foley discontinued and patient has voided, zofran given for nausea no emesis, will continue to monitor Means, Myrtie Hawk RN 12-06-11 18:56pm

## 2011-12-06 NOTE — Progress Notes (Signed)
Utilization review completed.  

## 2011-12-06 NOTE — Progress Notes (Signed)
3 Days Post-Op  Subjective: C/o increased pain and bloating today.  Also c/o itching.   Objective: Vital signs in last 24 hours: Temp:  [98.6 F (37 C)-98.8 F (37.1 C)] 98.8 F (37.1 C) (05/27 0530) Pulse Rate:  [72-89] 72  (05/27 0530) Resp:  [16-18] 16  (05/27 0530) BP: (93-143)/(51-80) 143/55 mmHg (05/27 0530) SpO2:  [98 %-100 %] 99 % (05/27 0530) Last BM Date: 11/29/11  Intake/Output from previous day: 05/26 0701 - 05/27 0700 In: 3663.5 [I.V.:2962.5; IV Piggyback:700] Out: 1740 [Urine:1700; Drains:40] Intake/Output this shift:    General appearance: alert, cooperative and no distress Resp: nonlabored Cardio: normal rate, regular GI: soft, ND, incisions without infection, JP's serous output bilaterally  Lab Results:   Basename 12/06/11 0405 12/05/11 0640  WBC 12.4* 17.6*  HGB 8.3* 8.1*  HCT 26.7* 25.9*  PLT 389 306   BMET  Basename 12/06/11 0405 12/05/11 0640  NA 135 131*  K 4.2 4.5  CL 102 102  CO2 23 22  GLUCOSE 181* 217*  BUN 24* 27*  CREATININE 0.98 1.24*  CALCIUM 8.4 7.7*   PT/INR No results found for this basename: LABPROT:2,INR:2 in the last 72 hours ABG No results found for this basename: PHART:2,PCO2:2,PO2:2,HCO3:2 in the last 72 hours  Studies/Results: No results found.  Anti-infectives: Anti-infectives     Start     Dose/Rate Route Frequency Ordered Stop   12/03/11 2200   ciprofloxacin (CIPRO) IVPB 400 mg        400 mg 200 mL/hr over 60 Minutes Intravenous Every 12 hours 12/03/11 2130     12/03/11 2200   metroNIDAZOLE (FLAGYL) IVPB 500 mg        500 mg 100 mL/hr over 60 Minutes Intravenous Every 8 hours 12/03/11 2130     12/03/11 1800   ertapenem (INVANZ) 1 g in sodium chloride 0.9 % 50 mL IVPB  Status:  Discontinued        1 g 100 mL/hr over 30 Minutes Intravenous Every 24 hours 12/03/11 1648 12/06/11 0819   12/03/11 1500   ertapenem (INVANZ) 1 g in sodium chloride 0.9 % 50 mL IVPB        1 g 100 mL/hr over 30 Minutes  Intravenous  Once 12/03/11 1456 12/03/11 1542          Assessment/Plan: s/p Procedure(s) (LRB): APPENDECTOMY LAPAROSCOPIC (N/A) given bloating and increased pain, will be cautious about increasing diet.  D/c foley.  continue to mobilze.  WBC down today.  LOS: 3 days    Lodema Pilot DAVID 12/06/2011

## 2011-12-07 LAB — CBC
HCT: 27.4 % — ABNORMAL LOW (ref 36.0–46.0)
MCH: 25.9 pg — ABNORMAL LOW (ref 26.0–34.0)
MCV: 79.9 fL (ref 78.0–100.0)
Platelets: 435 10*3/uL — ABNORMAL HIGH (ref 150–400)
RBC: 3.43 MIL/uL — ABNORMAL LOW (ref 3.87–5.11)

## 2011-12-07 LAB — DIFFERENTIAL
Eosinophils Relative: 3 % (ref 0–5)
Lymphocytes Relative: 14 % (ref 12–46)
Monocytes Absolute: 0.9 10*3/uL (ref 0.1–1.0)
Monocytes Relative: 8 % (ref 3–12)
Neutrophils Relative %: 74 % (ref 43–77)

## 2011-12-07 LAB — GLUCOSE, CAPILLARY
Glucose-Capillary: 173 mg/dL — ABNORMAL HIGH (ref 70–99)
Glucose-Capillary: 231 mg/dL — ABNORMAL HIGH (ref 70–99)

## 2011-12-07 LAB — BASIC METABOLIC PANEL
CO2: 22 mEq/L (ref 19–32)
Calcium: 8.5 mg/dL (ref 8.4–10.5)
Chloride: 103 mEq/L (ref 96–112)
Glucose, Bld: 186 mg/dL — ABNORMAL HIGH (ref 70–99)
Sodium: 135 mEq/L (ref 135–145)

## 2011-12-07 MED ORDER — DIPHENHYDRAMINE HCL 12.5 MG/5ML PO ELIX
12.5000 mg | ORAL_SOLUTION | Freq: Four times a day (QID) | ORAL | Status: DC | PRN
  Administered 2011-12-07 – 2011-12-08 (×2): 12.5 mg via ORAL
  Filled 2011-12-07 (×2): qty 5

## 2011-12-07 MED ORDER — HYDROCODONE-ACETAMINOPHEN 7.5-325 MG PO TABS
1.0000 | ORAL_TABLET | Freq: Four times a day (QID) | ORAL | Status: DC | PRN
  Administered 2011-12-07 – 2011-12-08 (×4): 2 via ORAL
  Filled 2011-12-07 (×4): qty 2

## 2011-12-07 MED ORDER — FAMOTIDINE 20 MG PO TABS
20.0000 mg | ORAL_TABLET | Freq: Two times a day (BID) | ORAL | Status: DC
  Administered 2011-12-07 – 2011-12-09 (×5): 20 mg via ORAL
  Filled 2011-12-07 (×6): qty 1

## 2011-12-07 MED ORDER — METRONIDAZOLE 250 MG PO TABS
250.0000 mg | ORAL_TABLET | Freq: Three times a day (TID) | ORAL | Status: DC
  Administered 2011-12-07 – 2011-12-09 (×5): 250 mg via ORAL
  Filled 2011-12-07 (×9): qty 1

## 2011-12-07 MED ORDER — CIPROFLOXACIN HCL 500 MG PO TABS
500.0000 mg | ORAL_TABLET | Freq: Two times a day (BID) | ORAL | Status: DC
  Administered 2011-12-07 – 2011-12-09 (×4): 500 mg via ORAL
  Filled 2011-12-07 (×6): qty 1

## 2011-12-07 NOTE — Progress Notes (Signed)
Pt c/o of indigestion and acid reflux. [facility] PA notified. New order for pepcid 20mg  po bid. Pt. Made aware.

## 2011-12-07 NOTE — Progress Notes (Signed)
4 Days Post-Op  Subjective: Not moving complains of pain in early AM, and thru day.  Some flatus, not BM complaining of indegestion  Objective: Vital signs in last 24 hours: Temp:  [97.4 F (36.3 C)-98.8 F (37.1 C)] 97.4 F (36.3 C) (05/28 0532) Pulse Rate:  [71-84] 71  (05/28 0532) Resp:  [18-20] 20  (05/28 0532) BP: (92-171)/(60-77) 117/70 mmHg (05/28 0532) SpO2:  [97 %-99 %] 99 % (05/28 0532) Last BM Date: 11/29/11  Intake/Output from previous day: 05/27 0701 - 05/28 0700 In: 3027.1 [I.V.:3027.1] Out: 1847.5 [Urine:1800; Drains:47.5] Intake/Output this shift: Total I/O In: 240 [P.O.:240] Out: 0   General appearance: alert, cooperative and no distress Resp: clear to auscultation bilaterally GI: drains, clear, incisions look good, few Bs.  Lab Results:   Basename 12/07/11 0345 12/06/11 0405  WBC 10.9* 12.4*  HGB 8.9* 8.3*  HCT 27.4* 26.7*  PLT 435* 389    BMET  Basename 12/07/11 0345 12/06/11 0405  NA 135 135  K 4.9 4.2  CL 103 102  CO2 22 23  GLUCOSE 186* 181*  BUN 17 24*  CREATININE 0.79 0.98  CALCIUM 8.5 8.4   PT/INR No results found for this basename: LABPROT:2,INR:2 in the last 72 hours   Lab 12/03/11 1310  AST 8  ALT 6  ALKPHOS 64  BILITOT 0.4  PROT 7.3  ALBUMIN 3.3*     Lipase     Component Value Date/Time   LIPASE 9* 12/03/2011 1310     Studies/Results: No results found.  Medications:    . ciprofloxacin  400 mg Intravenous Q12H  . enoxaparin  40 mg Subcutaneous Q24H  . hydrochlorothiazide  12.5 mg Oral Daily  . insulin aspart  0-20 Units Subcutaneous Q4H  . lisinopril  30 mg Oral Daily  . metronidazole  500 mg Intravenous Q8H  . pantoprazole (PROTONIX) IV  40 mg Intravenous QHS    Assessment/Plan Acute appendicitis with probable rupture.. S/p Laparoscopic appendectomy for perforated appendicitis with  drainage of intra-abdominal abscess.12/03/11 Dr. Biagio Quint  2. Distal dependent diabetes  3. Hypertension  4. BMI of 49    5. GERD  6. 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  7. History of depression.   Plan:  Advance diet, decide on when to take drains, mobilize, up norco dose, Home when she is better and moving.   LOS: 4 days    JENNINGS,WILLARD 12/07/2011  Up in bathroom.  She had an accident, but is doing okay. As above.  Ovidio Kin, MD, [facility] Pager: [phone] Office phone:  [phone]

## 2011-12-07 NOTE — Progress Notes (Signed)
Inpatient Diabetes Program Recommendations  AACE/ADA: New Consensus Statement on Inpatient Glycemic Control (2009)  Target Ranges:  Prepandial:   less than 140 mg/dL      Peak postprandial:   less than 180 mg/dL (1-2 hours)      Critically ill patients:  140 - 180 mg/dL   Reason for Visit: Hyperglycemia  Results for Kristina Adams, Kristina Adams (MRN [id number]) as of 12/07/2011 13:27  Ref. Range 12/06/2011 11:55 12/06/2011 16:35 12/06/2011 20:00 12/06/2011 23:44 12/07/2011 04:05 12/07/2011 07:20  Glucose-Capillary Latest Range: 70-99 mg/dL 132 (H) 440 (H) 102 (H) 170 (H) 185 (H) 173 (H)    Inpatient Diabetes Program Recommendations Insulin - Basal: Add Lantus 20 units QHS Diet: When diet advanced, CHO-mod med  Note: Will follow.

## 2011-12-08 ENCOUNTER — Encounter (HOSPITAL_COMMUNITY): Payer: Self-pay | Admitting: General Surgery

## 2011-12-08 LAB — GLUCOSE, CAPILLARY
Glucose-Capillary: 219 mg/dL — ABNORMAL HIGH (ref 70–99)
Glucose-Capillary: 177 mg/dL — ABNORMAL HIGH (ref 70–99)
Glucose-Capillary: 299 mg/dL — ABNORMAL HIGH (ref 70–99)

## 2011-12-08 MED ORDER — HYDROCODONE-ACETAMINOPHEN 7.5-325 MG PO TABS
1.0000 | ORAL_TABLET | ORAL | Status: DC | PRN
  Administered 2011-12-08 – 2011-12-09 (×3): 2 via ORAL
  Filled 2011-12-08 (×3): qty 2

## 2011-12-08 MED ORDER — ONDANSETRON HCL 4 MG PO TABS
4.0000 mg | ORAL_TABLET | Freq: Three times a day (TID) | ORAL | Status: DC | PRN
  Administered 2011-12-08 (×2): 4 mg via ORAL
  Filled 2011-12-08 (×2): qty 1

## 2011-12-08 NOTE — Progress Notes (Signed)
5 Days Post-Op   Subjective: Says she feels terrible, nausea, feel bad when she void.  Stomach hurts when she gets up, and skin near drain site burns. IV is out and they have not successfully restarted it, multiple sticks.Marland Kitchen She was switched over to PO antibiotics last night about 2230 hrs.  Objective: Vital signs in last 24 hours: Temp:  [98 F (36.7 C)-99.4 F (37.4 C)] 98 F (36.7 C) (05/29 0649) Pulse Rate:  [66-68] 67  (05/29 0649) Resp:  [18-20] 18  (05/29 0649) BP: (127-135)/(70-73) 127/70 mmHg (05/29 0649) SpO2:  [99 %-100 %] 99 % (05/29 0649) Last BM Date: 12/07/11 +BM, 460 thru drains yesterday, 47 ml recorded day prior.  TM99.4 VSS, glucose mostly in the 200 range  Intake/Output from previous day: 05/28 0701 - 05/29 0700 In: 2792.9 [P.O.:1320; I.V.:1472.9] Out: 3511 [Urine:3050; Drains:460; Stool:1] Intake/Output this shift: Total I/O In: -  Out: 810 [Urine:800; Drains:10]  General appearance: alert, cooperative and multiple complaints. GI: soft, tender, +BS, on a dysphagia diet, not sure why.  Incisions and sites look fine.  Drainage on right is clear, drain on left is a bit coludy, it is also the one that drained 410 ml yesterday.  Lab Results:   Basename 12/07/11 0345 12/06/11 0405  WBC 10.9* 12.4*  HGB 8.9* 8.3*  HCT 27.4* 26.7*  PLT 435* 389    BMET  Basename 12/07/11 0345 12/06/11 0405  NA 135 135  K 4.9 4.2  CL 103 102  CO2 22 23  GLUCOSE 186* 181*  BUN 17 24*  CREATININE 0.79 0.98  CALCIUM 8.5 8.4   PT/INR No results found for this basename: LABPROT:2,INR:2 in the last 72 hours   Lab 12/03/11 1310  AST 8  ALT 6  ALKPHOS 64  BILITOT 0.4  PROT 7.3  ALBUMIN 3.3*     Lipase     Component Value Date/Time   LIPASE 9* 12/03/2011 1310     Studies/Results: No results found.  Medications:    . ciprofloxacin  500 mg Oral BID  . enoxaparin  40 mg Subcutaneous Q24H  . famotidine  20 mg Oral BID  . hydrochlorothiazide  12.5 mg Oral  Daily  . insulin aspart  0-20 Units Subcutaneous Q4H  . lisinopril  30 mg Oral Daily  . metroNIDAZOLE  250 mg Oral Q8H  . DISCONTD: ciprofloxacin  400 mg Intravenous Q12H  . DISCONTD: metronidazole  500 mg Intravenous Q8H  . DISCONTD: pantoprazole (PROTONIX) IV  40 mg Intravenous QHS    Assessment/Plan Acute appendicitis with probable rupture.. S/p Laparoscopic appendectomy for perforated appendicitis with  drainage of intra-abdominal abscess. 12/03/11- Dr. Biagio Quint   2. Insulin dependent diabetes  3. Hypertension  4. BMI of 49  5. GERD  6. 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  7. History of depression.   Plan:  Discuss drainage with DR. Chiquetta Langner.  Continue PO antibiotics, PO Zofran, check urine. She had a U/A and culture ordered 5/24, but does not appear to have been done. Continue to mobilize.recheck labs in AM if we can get them.   LOS: 5 days    Kristina Adams,Kristina Adams 12/08/2011  Two drains in place.  Both drained less than 30 cc today.  Yesterday, the LLQ drain had 410 cc.  I pulled the LLQ drain tonight.  She is on liquids.  She has a lot of family in the room.  Probably home tomorrow.  Ovidio Kin, MD, [facility] Pager: [phone] Office phone:  [phone]   

## 2011-12-08 NOTE — Progress Notes (Signed)
Physical Therapy Note  Order received. Chart reviewed. Spoke briefly with pt who reports she has been mobilizing without assist in room and hallway. Pt denied need for PT at this time. PT will sign off. Thanks.  Rebeca Alert, PT  [phone]

## 2011-12-08 NOTE — Progress Notes (Signed)
UR complete 

## 2011-12-09 ENCOUNTER — Encounter (HOSPITAL_COMMUNITY): Payer: Self-pay | Admitting: General Surgery

## 2011-12-09 DIAGNOSIS — D649 Anemia, unspecified: Secondary | ICD-10-CM

## 2011-12-09 DIAGNOSIS — D509 Iron deficiency anemia, unspecified: Secondary | ICD-10-CM | POA: Diagnosis present

## 2011-12-09 HISTORY — DX: Anemia, unspecified: D64.9

## 2011-12-09 LAB — COMPREHENSIVE METABOLIC PANEL
AST: 38 U/L — ABNORMAL HIGH (ref 0–37)
Alkaline Phosphatase: 62 U/L (ref 39–117)
BUN: 5 mg/dL — ABNORMAL LOW (ref 6–23)
CO2: 27 mEq/L (ref 19–32)
Chloride: 99 mEq/L (ref 96–112)
Creatinine, Ser: 0.65 mg/dL (ref 0.50–1.10)
GFR calc non Af Amer: >90 mL/min (ref >90)
Potassium: 4.7 mEq/L (ref 3.5–5.1)
Total Bilirubin: 0.1 mg/dL — ABNORMAL LOW (ref 0.3–1.2)

## 2011-12-09 LAB — CBC
HCT: 27.8 % — ABNORMAL LOW (ref 36.0–46.0)
MCV: 79.4 fL (ref 78.0–100.0)
RBC: 3.5 MIL/uL — ABNORMAL LOW (ref 3.87–5.11)
WBC: 12.2 10*3/uL — ABNORMAL HIGH (ref 4.0–10.5)

## 2011-12-09 LAB — GLUCOSE, CAPILLARY
Glucose-Capillary: 156 mg/dL — ABNORMAL HIGH (ref 70–99)
Glucose-Capillary: 181 mg/dL — ABNORMAL HIGH (ref 70–99)
Glucose-Capillary: 242 mg/dL — ABNORMAL HIGH (ref 70–99)

## 2011-12-09 LAB — URINALYSIS, ROUTINE W REFLEX MICROSCOPIC
Bilirubin Urine: NEGATIVE
Specific Gravity, Urine: 1.013 (ref 1.005–1.030)
Urobilinogen, UA: 0.2 mg/dL (ref 0.0–1.0)

## 2011-12-09 LAB — URINE MICROSCOPIC-ADD ON

## 2011-12-09 LAB — MAGNESIUM: Magnesium: 1.7 mg/dL (ref 1.5–2.5)

## 2011-12-09 MED ORDER — METRONIDAZOLE 500 MG PO TABS: 500.0000 mg | ORAL_TABLET | Freq: Three times a day (TID) | ORAL | Status: AC

## 2011-12-09 MED ORDER — CIPROFLOXACIN HCL 500 MG PO TABS: 500.0000 mg | ORAL_TABLET | Freq: Two times a day (BID) | ORAL | Status: AC

## 2011-12-09 MED ORDER — ACETAMINOPHEN 325 MG PO TABS: 650.0000 mg | ORAL_TABLET | Freq: Four times a day (QID) | ORAL | Status: DC | PRN

## 2011-12-09 MED ORDER — HYDROCODONE-ACETAMINOPHEN 7.5-325 MG PO TABS: 1.0000 | ORAL_TABLET | ORAL | Status: AC | PRN

## 2011-12-09 NOTE — Discharge Summary (Signed)
6 Days Post-Op  Physician Discharge Summary  Patient ID: Kristina Adams MRN: [id number] DOB/AGE: Feb 19, 1966 46 y.o.  Admit date: 12/03/2011 Discharge date: 12/09/2011  Admission Diagnoses: Acute appendicitis with probable rupture. .2. Insulin dependent diabetes  3. Hypertension  4. BMI of 49  5. GERD  6. 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  7. History of depression.  8. Anemia   Discharge Diagnoses:  Principal Problem:  *Appendicitis, acute Active Problems:  Morbid obesity with BMI of 45.0-49.9, adult  Hypertension  Diabetes mellitus type 2, insulin dependent  Sleep apnea  Chronic back pain  Anemia  Depression   PROCEDURES:  S/p Laparoscopic appendectomy for perforated appendicitis with drainage of intra-abdominal abscess. 12/03/11- Dr. Wynelle Beckmann Course: Patient is a 46 year old female who reports she developed pain Wednesday afternoon 12/01/11, after lunch around 11:30. She thought was gas improvement nor. Thursday she had continued pain has become progressively worse. She's had nausea with it but no vomiting. She had some diarrhea was the evening her symptoms started, no bowel movement since. She denies vomiting diarrhea or constipation since then her last meal was at 10 PM last night. She presented to the ER with a temperature of 101.4. Labs show a white count of 24,000, CT scan shows inflammatory changes in the right lower quadrant consistent with a perforated appendix. There is tubular gas filled structure which most likely represents the appendix and there is an adjacent extraluminal air. No drainable abscess was noted. We are asked to the patient consultation and for treatment. She was taken to the OR and underwent procedure.  She did well but was slow and tender, hard to move.  We have kept her on antibiotics and she's had marked improvement of her temp curve.  She still has an elevated WBC. Left drain was removed 5/29, and we removed the Right drain 5/30.   We plan to send her home today with 7 more days of antibiotics.  Follow up: Dr. Biagio Quint 2 weeks.  Condition on D/C:  Improved  CBC    Component Value Date/Time   WBC 12.2* 12/09/2011 0345   RBC 3.50* 12/09/2011 0345   HGB 8.7* 12/09/2011 0345   HCT 27.8* 12/09/2011 0345   PLT 496* 12/09/2011 0345   MCV 79.4 12/09/2011 0345   MCH 24.9* 12/09/2011 0345   MCHC 31.3 12/09/2011 0345   RDW 15.1 12/09/2011 0345   LYMPHSABS 1.5 12/07/2011 0345   MONOABS 0.9 12/07/2011 0345   EOSABS 0.3 12/07/2011 0345   BASOSABS 0.1 12/07/2011 0345   CMP     Component Value Date/Time   NA 135 12/09/2011 0345   K 4.7 12/09/2011 0345   CL 99 12/09/2011 0345   CO2 27 12/09/2011 0345   GLUCOSE 218* 12/09/2011 0345   BUN 5* 12/09/2011 0345   CREATININE 0.65 12/09/2011 0345   CALCIUM 8.6 12/09/2011 0345   PROT 6.1 12/09/2011 0345   ALBUMIN 2.6* 12/09/2011 0345   AST 38* 12/09/2011 0345   ALT 17 12/09/2011 0345   ALKPHOS 62 12/09/2011 0345   BILITOT 0.1* 12/09/2011 0345   GFRNONAA >90 12/09/2011 0345   GFRAA >90 12/09/2011 0345    Disposition: Home  Discharge Orders    Future Appointments: Provider: Department: Dept Phone: Center:   12/17/2011 10:30 AM Carrolyn Leigh, RD [facility] [phone] NDM     Medication List  As of 12/09/2011 11:41 AM   TAKE these medications  acetaminophen 325 MG tablet   Commonly known as: TYLENOL   Take 2 tablets (650 mg total) by mouth every 6 (six) hours as needed (or Temp > 100).      cholecalciferol 1000 UNITS tablet   Commonly known as: VITAMIN D   Take 2,000 Units by mouth daily.      ciprofloxacin 500 MG tablet   Commonly known as: CIPRO   Take 1 tablet (500 mg total) by mouth 2 (two) times daily.      HYDROcodone-acetaminophen 7.5-325 MG per tablet   Commonly known as: NORCO   Take 1-2 tablets by mouth every 4 (four) hours as needed.      insulin NPH-insulin regular (70-30) 100 UNIT/ML injection   Commonly known as: NOVOLIN 70/30   Inject 80 Units into  the skin 2 (two) times daily.      lisinopril 30 MG tablet   Commonly known as: PRINIVIL,ZESTRIL   Take 30 mg by mouth daily.      metFORMIN 500 MG tablet   Commonly known as: GLUCOPHAGE   Take 250 mg by mouth 2 (two) times daily with a meal.      metroNIDAZOLE 500 MG tablet   Commonly known as: FLAGYL   Take 1 tablet (500 mg total) by mouth every 8 (eight) hours.      mulitivitamin with minerals Tabs   Take 1 tablet by mouth daily.      PARoxetine 20 MG tablet   Commonly known as: PAXIL   Take 20 mg by mouth every morning.      vitamin C 500 MG tablet   Commonly known as: ASCORBIC ACID   Take 1,000 mg by mouth daily.         ASK your doctor about these medications         hydrochlorothiazide 12.5 MG capsule   Commonly known as: MICROZIDE   Take 12.5 mg by mouth daily.           Follow-up Information    Follow up with Tomma Lightning, MD. Schedule an appointment as soon as possible for a visit in 2 weeks. (Follow up on your medical needs, blood pressure, diabetes. I would suggest she see you in the next couple weeks .  Call her for any medical issues.)    Contact information:   [address] [facility] [place] [address] [phone]       Follow up with Lodema Pilot Benigna Delisi, DO. Schedule an appointment as soon as possible for a visit in 2 weeks.   Contact information:   [address] [place] [address] [phone]       Please follow up. (No lifting over 20 pounds for 4 weeks.)          Signed: Sherrie George 12/09/2011, 11:41 AM  Ovidio Kin, MD, [facility] Pager: [phone] Office phone:  [phone]

## 2011-12-09 NOTE — Progress Notes (Signed)
Patient is alert and oriented, vital signs are stable, incision to abdomen is normal, pain control with oral medication, bowel sounds are active, patient is tolerating diet with no complaints of nausea or vomiting, questions and concerns answered, will continue to monitor Means, Myrtie Hawk RN 12-09-11 12:43pm

## 2011-12-09 NOTE — Progress Notes (Signed)
6 Days Post-Op   Subjective: Feels better, got up some yesterday eating and drinking without difficulty.  Objective: Vital signs in last 24 hours: Temp:  [97.7 F (36.5 C)-98.4 F (36.9 C)] 97.7 F (36.5 C) (05/30 0602) Pulse Rate:  [70-75] 70  (05/30 0602) Resp:  [18] 18  (05/30 0602) BP: (95-125)/(57-75) 104/69 mmHg (05/30 0602) SpO2:  [97 %-98 %] 98 % (05/30 0602) Last BM Date: 12/08/11  45 ml Drain 1, 5 ml drain 2, +3BM's yesterday, afebrile, VSS, WBC still up some, UA was negative.  Intake/Output from previous day: 05/29 0701 - 05/30 0700 In: 620 [P.O.:620] Out: 3800 [Urine:3750; Drains:50] Intake/Output this shift: Total I/O In: 240 [P.O.:240] Out: 10 [Drains:10]  General appearance: alert, cooperative and no distress GI: soft, not really tender, normal bowel function again. Drain on Right with minimal drainage looks clear.  Lab Results:   Basename 12/09/11 0345 12/07/11 0345  WBC 12.2* 10.9*  HGB 8.7* 8.9*  HCT 27.8* 27.4*  PLT 496* 435*    BMET  Basename 12/09/11 0345 12/07/11 0345  NA 135 135  K 4.7 4.9  CL 99 103  CO2 27 22  GLUCOSE 218* 186*  BUN 5* 17  CREATININE 0.65 0.79  CALCIUM 8.6 8.5   PT/INR No results found for this basename: LABPROT:2,INR:2 in the last 72 hours   Lab 12/09/11 0345 12/03/11 1310  AST 38* 8  ALT 17 6  ALKPHOS 62 64  BILITOT 0.1* 0.4  PROT 6.1 7.3  ALBUMIN 2.6* 3.3*     Lipase     Component Value Date/Time   LIPASE 9* 12/03/2011 1310     Studies/Results: No results found.  Medications:    . ciprofloxacin  500 mg Oral BID  . enoxaparin  40 mg Subcutaneous Q24H  . famotidine  20 mg Oral BID  . hydrochlorothiazide  12.5 mg Oral Daily  . insulin aspart  0-20 Units Subcutaneous Q4H  . lisinopril  30 mg Oral Daily  . metroNIDAZOLE  250 mg Oral Q8H    Assessment/Plan 1. Acute appendicitis with probable rupture..   S/p Laparoscopic appendectomy for perforated appendicitis with drainage of intra-abdominal  abscess. 12/03/11- Dr. Biagio Quint  2. Insulin dependent diabetes  3. Hypertension  4. BMI of 49  5. GERD  6. 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  7. History of depression. 8. Anemia    Plan:  This will be day 6 of antibiotics, WBC is still high, will discuss removing remaining drain and sending home today.   LOS: 6 days    JENNINGS,WILLARD 12/09/2011  Agree with above.  Ovidio Kin, MD, [facility] Pager: [phone] Office phone:  [phone]

## 2011-12-09 NOTE — Discharge Instructions (Signed)
[facility] ______[facility] °LAPAROSCOPIC SURGERY: POST OP INSTRUCTIONS °Always review your discharge instruction sheet given to you by the facility where your surgery was performed. °IF YOU HAVE DISABILITY OR FAMILY LEAVE FORMS, YOU MUST BRING THEM TO THE OFFICE FOR PROCESSING.   °DO NOT GIVE THEM TO YOUR DOCTOR. ° °1. A prescription for pain medication may be given to you upon discharge.  Take your pain medication as prescribed, if needed.  If narcotic pain medicine is not needed, then you may take acetaminophen (Tylenol) or ibuprofen (Advil) as needed. °2. Take your usually prescribed medications unless otherwise directed. °3. If you need a refill on your pain medication, please contact your pharmacy.  They will contact our office to request authorization. Prescriptions will not be filled after 5pm or on week-ends. °4. You should follow a light diet the first few days after arrival home, such as soup and crackers, etc.  Be sure to include lots of fluids daily. °5. Most patients will experience some swelling and bruising in the area of the incisions.  Ice packs will help.  Swelling and bruising can take several days to resolve.  °6. It is common to experience some constipation if taking pain medication after surgery.  Increasing fluid intake and taking a stool softener (such as Colace) will usually help or prevent this problem from occurring.  A mild laxative (Milk of Magnesia or Miralax) should be taken according to package instructions if there are no bowel movements after 48 hours. °7. Unless discharge instructions indicate otherwise, you may remove your bandages 24-48 hours after surgery, and you may shower at that time.  You may have steri-strips (small skin tapes) in place directly over the incision.  These strips should be left on the skin for 7-10 days.  If your surgeon used skin glue on the incision, you may shower in 24 hours.  The glue will flake off over the next 2-3 weeks.  Any sutures or  staples will be removed at the office during your follow-up visit. °8. ACTIVITIES:  You may resume regular (light) daily activities beginning the next day--such as daily self-care, walking, climbing stairs--gradually increasing activities as tolerated.  You may have sexual intercourse when it is comfortable.  Refrain from any heavy lifting or straining until approved by your doctor. °a. You may drive when you are no longer taking prescription pain medication, you can comfortably wear a seatbelt, and you can safely maneuver your car and apply brakes. °b. RETURN TO WORK:  __________________________________________________________ °9. You should see your doctor in the office for a follow-up appointment approximately 2-3 weeks after your surgery.  Make sure that you call for this appointment within a day or two after you arrive home to insure a convenient appointment time. °10. OTHER INSTRUCTIONS: __________________________________________________________________________________________________________________________ __________________________________________________________________________________________________________________________ °WHEN TO CALL YOUR DOCTOR: °1. Fever over 101.0 °2. Inability to urinate °3. Continued bleeding from incision. °4. Increased pain, redness, or drainage from the incision. °5. Increasing abdominal pain ° °The clinic staff is available to answer your questions during regular business hours.  Please don’t hesitate to call and ask to speak to one of the nurses for clinical concerns.  If you have a medical emergency, go to the nearest emergency room or call 911.  A surgeon from [facility] is always on call at the hospital. °[address], [address], [place], [place]  [address] ? [address] [address], [place], [place]   [address] °[phone] ? [phone] ? FAX [phone] °Web site:   [email or web address] °

## 2011-12-10 ENCOUNTER — Ambulatory Visit: Payer: Federal, State, Local not specified - PPO | Admitting: *Deleted

## 2011-12-13 DIAGNOSIS — E1140 Type 2 diabetes mellitus with diabetic neuropathy, unspecified: Secondary | ICD-10-CM | POA: Insufficient documentation

## 2011-12-17 ENCOUNTER — Encounter: Payer: Federal, State, Local not specified - PPO | Attending: Family Medicine | Admitting: *Deleted

## 2011-12-17 ENCOUNTER — Encounter: Payer: Self-pay | Admitting: *Deleted

## 2011-12-17 VITALS — Ht 70.0 in | Wt 328.4 lb

## 2011-12-17 DIAGNOSIS — Z713 Dietary counseling and surveillance: Secondary | ICD-10-CM | POA: Insufficient documentation

## 2011-12-17 DIAGNOSIS — Z794 Long term (current) use of insulin: Secondary | ICD-10-CM | POA: Insufficient documentation

## 2011-12-17 DIAGNOSIS — E119 Type 2 diabetes mellitus without complications: Secondary | ICD-10-CM | POA: Insufficient documentation

## 2011-12-17 NOTE — Progress Notes (Signed)
  Medical Nutrition Therapy:  Appt start time: 6389 end time:  3734.   Assessment:  Primary concerns today: diabetes.   MEDICATIONS: see list   DIETARY INTAKE:  Usual eating pattern includes 3 meals and 2-3 snacks per day.  Everyday foods include leaner proteins, vegetables, refined carbohydrates.  Avoided foods include none.    24-hr recall:  B ( AM):1 instant packet oatmeal with 1 cup yogurt and granola bar Snk ( AM): water  L ( PM): [place] and cheese sandwich , sometimes chips or fruit Snk ( PM): yogurt, granola bar, maybe fruit or pack of Nabs D ( PM): salad with protein and cheese or leftovers (chicken with pasta or rice and vegetable) Snk ( PM): granola bar Another meal around 9: salad or whatever is there. Over eat Beverages: water with sugar-free flavoring  Usual physical activity: walking some at work [employer])  Estimated energy needs: 1800 calories 200 g carbohydrates 135 g protein 44 g fat  Progress Towards Goal(s):  In progress.   Nutritional Diagnosis:  NB-1.1 Food and nutrition-related knowledge deficit related to carbohyrdrate-containing foods.  As evidenced by HgA1C of 8.5%.    Intervention:  Nutrition counseling provided.  Discussed etiology of diabetes and disease progression.  Discussed diabetes medications and role of obesity on insulin resistance.  Discussed carb counting, reading food labels, and limiting dietary fats.  Discussed portion control and increasing physical activity.  Also discussed effects of alcohol on blood sugar  Handouts given during visit include: Living Well with Diabetes Carb Counting and Food Label handouts Meal Plan Card  Goals:  Follow Diabetes Meal Plan as instructed  Eat 3 meals and 2 snacks, every 3-5 hrs  Limit carbohydrate intake to 45 grams carbohydrate/meal (3 carb choices) 1- 2 carbs at breakfast if sugar elevated  Limit carbohydrate intake to 15 grams carbohydrate/snack (1 carb choice)  Add lean protein  foods to meals/snacks  Supplement carb snacks with vegetables for satiety  Monitor glucose levels as instructed by your doctor. Monitor before late night meal and before breakfast  Aim for 30 mins of physical activity daily  Bring food record and glucose log to your next nutrition visit  Choose lower fat dairy products and lean proteins  Monitoring/Evaluation:  Dietary intake, exercise, GBM, reading food labels, and body weight in 6 week(s).

## 2011-12-17 NOTE — Patient Instructions (Signed)
Goals:  Follow Diabetes Meal Plan as instructed  Eat 3 meals and 2 snacks, every 3-5 hrs  Limit carbohydrate intake to 45 grams carbohydrate/meal (3 carb choices) 1- 2 carbs at breakfast if sugar elevated  Limit carbohydrate intake to 15 grams carbohydrate/snack (1 carb choice)  Add lean protein foods to meals/snacks  Supplement carb snacks with vegetables for satiety  Monitor glucose levels as instructed by your doctor. Monitor before late night meal and before breakfast  Aim for 30 mins of physical activity daily  Bring food record and glucose log to your next nutrition visit  Choose lower fat dairy products and lean proteins

## 2011-12-22 ENCOUNTER — Encounter (INDEPENDENT_AMBULATORY_CARE_PROVIDER_SITE_OTHER): Payer: Self-pay | Admitting: General Surgery

## 2011-12-22 ENCOUNTER — Ambulatory Visit (INDEPENDENT_AMBULATORY_CARE_PROVIDER_SITE_OTHER): Payer: Federal, State, Local not specified - PPO | Admitting: General Surgery

## 2011-12-22 VITALS — BP 140/80 | HR 88 | Temp 97.2°F | Resp 14 | Ht 70.0 in | Wt 324.6 lb

## 2011-12-22 DIAGNOSIS — Z4889 Encounter for other specified surgical aftercare: Secondary | ICD-10-CM

## 2011-12-22 DIAGNOSIS — Z5189 Encounter for other specified aftercare: Secondary | ICD-10-CM

## 2011-12-22 NOTE — Progress Notes (Signed)
Subjective:     Patient ID: Kristina Adams, female   DOB: 04-May-1966, 46 y.o.   MRN: [id number]  HPI This patient follows up status post laparoscopic appendectomy for perforated appendicitis on 12/03/2011. She had an episode of crampy abdominal pain yesterday but this has since resolved. She says that her pain has continued to decrease and is not taking any pain medication. She states that her bowels are normal and she has regained her appetite. She does complain of some gassiness but again no fever chills recently. She says that she did have some fevers right after discharge but she is now off antibiotics and seems to be doing well and improving. Her pathology was benign.  Review of Systems     Objective:   Physical Exam Her abdomen is soft and nontender on exam her incisions are healing well without sign of infection. There is no evidence of hernia.    Assessment:     Status post laparoscopic appendectomy for perforated appendicitis She seems to be doing well at this time. She did have a postoperative ileus and her hospital course was was slow but she seems to be improving now. Her bowels are functioning and her pain has improved appropriately. I do not see any evidence of any postoperative complication. I think that she will continue to improve and she can increase activities as tolerated. Again, her pathology was benign.    Plan:     She can increase activities as tolerated and followup with me on a when necessary basis.

## 2011-12-29 ENCOUNTER — Encounter (INDEPENDENT_AMBULATORY_CARE_PROVIDER_SITE_OTHER): Payer: Federal, State, Local not specified - PPO | Admitting: Ophthalmology

## 2011-12-29 DIAGNOSIS — H4310 Vitreous hemorrhage, unspecified eye: Secondary | ICD-10-CM

## 2011-12-29 DIAGNOSIS — E11359 Type 2 diabetes mellitus with proliferative diabetic retinopathy without macular edema: Secondary | ICD-10-CM

## 2011-12-29 DIAGNOSIS — H2510 Age-related nuclear cataract, unspecified eye: Secondary | ICD-10-CM

## 2011-12-29 DIAGNOSIS — H3581 Retinal edema: Secondary | ICD-10-CM

## 2012-01-07 ENCOUNTER — Other Ambulatory Visit (INDEPENDENT_AMBULATORY_CARE_PROVIDER_SITE_OTHER): Payer: Federal, State, Local not specified - PPO | Admitting: Ophthalmology

## 2012-01-07 DIAGNOSIS — E11359 Type 2 diabetes mellitus with proliferative diabetic retinopathy without macular edema: Secondary | ICD-10-CM

## 2012-01-07 DIAGNOSIS — E1139 Type 2 diabetes mellitus with other diabetic ophthalmic complication: Secondary | ICD-10-CM

## 2012-01-21 ENCOUNTER — Ambulatory Visit (INDEPENDENT_AMBULATORY_CARE_PROVIDER_SITE_OTHER): Payer: Federal, State, Local not specified - PPO | Admitting: Ophthalmology

## 2012-01-28 ENCOUNTER — Ambulatory Visit (INDEPENDENT_AMBULATORY_CARE_PROVIDER_SITE_OTHER): Payer: Federal, State, Local not specified - PPO | Admitting: Ophthalmology

## 2012-01-28 DIAGNOSIS — E1139 Type 2 diabetes mellitus with other diabetic ophthalmic complication: Secondary | ICD-10-CM

## 2012-01-28 DIAGNOSIS — E11359 Type 2 diabetes mellitus with proliferative diabetic retinopathy without macular edema: Secondary | ICD-10-CM

## 2012-02-04 ENCOUNTER — Ambulatory Visit: Payer: Federal, State, Local not specified - PPO | Admitting: *Deleted

## 2012-04-07 ENCOUNTER — Other Ambulatory Visit: Payer: Self-pay | Admitting: Obstetrics and Gynecology

## 2012-04-12 ENCOUNTER — Other Ambulatory Visit: Payer: Self-pay | Admitting: Obstetrics and Gynecology

## 2012-04-12 DIAGNOSIS — R928 Other abnormal and inconclusive findings on diagnostic imaging of breast: Secondary | ICD-10-CM

## 2012-04-14 ENCOUNTER — Other Ambulatory Visit: Payer: Federal, State, Local not specified - PPO

## 2012-04-21 ENCOUNTER — Ambulatory Visit
Admission: RE | Admit: 2012-04-21 | Discharge: 2012-04-21 | Disposition: A | Discharge status: Home / Self Care | Payer: Federal, State, Local not specified - PPO | Source: Ambulatory Visit | Attending: Obstetrics and Gynecology | Admitting: Obstetrics and Gynecology

## 2012-04-21 DIAGNOSIS — R928 Other abnormal and inconclusive findings on diagnostic imaging of breast: Secondary | ICD-10-CM

## 2012-06-02 ENCOUNTER — Ambulatory Visit (INDEPENDENT_AMBULATORY_CARE_PROVIDER_SITE_OTHER): Payer: Federal, State, Local not specified - PPO | Admitting: Ophthalmology

## 2012-06-02 DIAGNOSIS — H35039 Hypertensive retinopathy, unspecified eye: Secondary | ICD-10-CM

## 2012-06-02 DIAGNOSIS — H4310 Vitreous hemorrhage, unspecified eye: Secondary | ICD-10-CM

## 2012-06-02 DIAGNOSIS — I10 Essential (primary) hypertension: Secondary | ICD-10-CM

## 2012-06-02 DIAGNOSIS — E11319 Type 2 diabetes mellitus with unspecified diabetic retinopathy without macular edema: Secondary | ICD-10-CM

## 2012-06-02 DIAGNOSIS — E1039 Type 1 diabetes mellitus with other diabetic ophthalmic complication: Secondary | ICD-10-CM

## 2012-08-04 ENCOUNTER — Encounter (INDEPENDENT_AMBULATORY_CARE_PROVIDER_SITE_OTHER): Payer: Federal, State, Local not specified - PPO | Admitting: Ophthalmology

## 2012-08-04 DIAGNOSIS — H43819 Vitreous degeneration, unspecified eye: Secondary | ICD-10-CM

## 2012-08-04 DIAGNOSIS — E11359 Type 2 diabetes mellitus with proliferative diabetic retinopathy without macular edema: Secondary | ICD-10-CM

## 2012-08-04 DIAGNOSIS — I10 Essential (primary) hypertension: Secondary | ICD-10-CM

## 2012-08-04 DIAGNOSIS — H3340 Traction detachment of retina, unspecified eye: Secondary | ICD-10-CM

## 2012-08-04 DIAGNOSIS — H4310 Vitreous hemorrhage, unspecified eye: Secondary | ICD-10-CM

## 2012-08-04 DIAGNOSIS — E1039 Type 1 diabetes mellitus with other diabetic ophthalmic complication: Secondary | ICD-10-CM

## 2012-08-04 DIAGNOSIS — H2510 Age-related nuclear cataract, unspecified eye: Secondary | ICD-10-CM

## 2012-08-04 DIAGNOSIS — H35039 Hypertensive retinopathy, unspecified eye: Secondary | ICD-10-CM

## 2012-08-06 DIAGNOSIS — H3340 Traction detachment of retina, unspecified eye: Secondary | ICD-10-CM

## 2012-08-06 NOTE — H&P (Signed)
Kristina Adams is an 47 y.o. female.   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 Diabetes mellitus   . Hypertension   . DDD (degenerative disc disease)   . Sleep apnea 12/03/2011  . Chronic back pain 12/03/2011  . Hypertension 12/03/2011  . Appendicitis, acute 12/03/2011  . Diabetes mellitus type 2, insulin dependent 12/03/2011  . Morbid obesity with BMI of 45.0-49.9, adult 12/03/2011  . Anemia 12/09/2011  . Blood transfusion   . Thyroid disease     Past Surgical History  Procedure Date  . Cesarean section   . Laparoscopic appendectomy 12/03/2011    Procedure: APPENDECTOMY LAPAROSCOPIC;  Surgeon: Lodema Pilot, DO;  Location: [facility];  Service: General;  Laterality: N/A;  drainage of intra-abdominal abscess   . Appendectomy     Family History  Problem Relation Age of Onset  . Cancer Mother   . Diabetes Other   . Cancer Other   . Hypertension Other    Social History:  reports that she has quit smoking. She has never used smokeless tobacco. She reports that she drinks about .6 ounces of alcohol per week. She reports that she does not use illicit drugs.  Allergies:  Allergies  Allergen Reactions  . Iohexol      Code: RASH, Desc: PATIENT CALLED 06/17/08-STATED AFTER 6 HOURS POST IV CONTRAST, HANDS,SCALP AND GROIN AREA DEVELOPED ITCHING,BURNING SENSATION, Onset Date: [id number]     No prescriptions prior to admission    Review of systems otherwise negative  There were no vitals taken for this visit.  Physical exam: Mental status: oriented x3. Eyes: See eye exam associated with this date of surgery in media tab.  Scanned in by scanning center Ears, Nose, Throat: within normal limits Neck: Within Normal limits General: within normal limits Chest: Within normal limits Breast: deferred Heart: Within normal limits Abdomen: Within normal limits GU: deferred Extremities:  within normal limits Skin: within normal limits  Assessment/Plan Traction retinal detachment left eye Plan: To [facility] for pars plana vitrectomy, laser, membrane peel, gas injection.  Left eye  Sherrie George 08/06/2012, 1:02 PM

## 2012-08-09 ENCOUNTER — Encounter (HOSPITAL_COMMUNITY): Payer: Self-pay | Admitting: Pharmacy Technician

## 2012-08-16 ENCOUNTER — Encounter (HOSPITAL_COMMUNITY): Payer: Self-pay

## 2012-08-16 MED ORDER — DEXTROSE 5 % IV SOLN
3.0000 g | INTRAVENOUS | Status: AC
  Administered 2012-08-17: 3 g via INTRAVENOUS
  Filled 2012-08-16 (×2): qty 3000

## 2012-08-16 MED ORDER — CYCLOPENTOLATE HCL 1 % OP SOLN
1.0000 [drp] | OPHTHALMIC | Status: AC | PRN
  Administered 2012-08-17: 1 [drp] via OPHTHALMIC
  Filled 2012-08-16: qty 2

## 2012-08-16 MED ORDER — PHENYLEPHRINE HCL 2.5 % OP SOLN
1.0000 [drp] | OPHTHALMIC | Status: AC | PRN
  Administered 2012-08-17: 1 [drp] via OPHTHALMIC
  Filled 2012-08-16: qty 3

## 2012-08-16 MED ORDER — GATIFLOXACIN 0.5 % OP SOLN
1.0000 [drp] | OPHTHALMIC | Status: AC | PRN
  Administered 2012-08-17 (×2): 1 [drp] via OPHTHALMIC
  Filled 2012-08-16: qty 2.5

## 2012-08-16 MED ORDER — TROPICAMIDE 1 % OP SOLN
1.0000 [drp] | OPHTHALMIC | Status: AC | PRN
  Administered 2012-08-17: 1 [drp] via OPHTHALMIC
  Filled 2012-08-16: qty 3

## 2012-08-17 ENCOUNTER — Ambulatory Visit (HOSPITAL_COMMUNITY)
Admission: RE | Admit: 2012-08-17 | Discharge: 2012-08-18 | Disposition: A | Discharge status: Home / Self Care | Payer: Federal, State, Local not specified - PPO | Source: Ambulatory Visit | Attending: Ophthalmology | Admitting: Ophthalmology

## 2012-08-17 ENCOUNTER — Ambulatory Visit (HOSPITAL_COMMUNITY): Payer: Federal, State, Local not specified - PPO | Admitting: Anesthesiology

## 2012-08-17 ENCOUNTER — Encounter (HOSPITAL_COMMUNITY): Payer: Self-pay

## 2012-08-17 ENCOUNTER — Encounter (HOSPITAL_COMMUNITY)
Admission: RE | Disposition: A | Discharge status: Home / Self Care | Payer: Self-pay | Source: Ambulatory Visit | Attending: Ophthalmology

## 2012-08-17 ENCOUNTER — Encounter (HOSPITAL_COMMUNITY): Payer: Self-pay | Admitting: Anesthesiology

## 2012-08-17 ENCOUNTER — Ambulatory Visit (HOSPITAL_COMMUNITY): Payer: Federal, State, Local not specified - PPO

## 2012-08-17 DIAGNOSIS — Z794 Long term (current) use of insulin: Secondary | ICD-10-CM | POA: Insufficient documentation

## 2012-08-17 DIAGNOSIS — H4310 Vitreous hemorrhage, unspecified eye: Secondary | ICD-10-CM

## 2012-08-17 DIAGNOSIS — E1139 Type 2 diabetes mellitus with other diabetic ophthalmic complication: Secondary | ICD-10-CM | POA: Insufficient documentation

## 2012-08-17 DIAGNOSIS — H3340 Traction detachment of retina, unspecified eye: Secondary | ICD-10-CM

## 2012-08-17 DIAGNOSIS — E11359 Type 2 diabetes mellitus with proliferative diabetic retinopathy without macular edema: Secondary | ICD-10-CM

## 2012-08-17 DIAGNOSIS — E1065 Type 1 diabetes mellitus with hyperglycemia: Secondary | ICD-10-CM

## 2012-08-17 DIAGNOSIS — E1039 Type 1 diabetes mellitus with other diabetic ophthalmic complication: Secondary | ICD-10-CM

## 2012-08-17 DIAGNOSIS — I10 Essential (primary) hypertension: Secondary | ICD-10-CM | POA: Insufficient documentation

## 2012-08-17 HISTORY — PX: PARS PLANA VITRECTOMY: SHX2166

## 2012-08-17 HISTORY — DX: Personal history of other diseases of the digestive system: Z87.19

## 2012-08-17 HISTORY — PX: REPAIR OF COMPLEX TRACTION RETINAL DETACHMENT: SHX6217

## 2012-08-17 HISTORY — DX: Other cervical disc degeneration, unspecified cervical region: M50.30

## 2012-08-17 HISTORY — DX: Gastro-esophageal reflux disease without esophagitis: K21.9

## 2012-08-17 LAB — BASIC METABOLIC PANEL
CO2: 28 mEq/L (ref 19–32)
Calcium: 9.2 mg/dL (ref 8.4–10.5)
Creatinine, Ser: 0.64 mg/dL (ref 0.50–1.10)
GFR calc non Af Amer: >90 mL/min (ref >90)
Glucose, Bld: 139 mg/dL — ABNORMAL HIGH (ref 70–99)
Sodium: 138 mEq/L (ref 135–145)

## 2012-08-17 LAB — SURGICAL PCR SCREEN: Staphylococcus aureus: NEGATIVE

## 2012-08-17 LAB — CBC
HCT: 32.6 % — ABNORMAL LOW (ref 36.0–46.0)
MCV: 79.5 fL (ref 78.0–100.0)
Platelets: 389 10*3/uL (ref 150–400)
RBC: 4.1 MIL/uL (ref 3.87–5.11)
RDW: 15 % (ref 11.5–15.5)
WBC: 7.8 10*3/uL (ref 4.0–10.5)

## 2012-08-17 LAB — GLUCOSE, CAPILLARY
Glucose-Capillary: 185 mg/dL — ABNORMAL HIGH (ref 70–99)
Glucose-Capillary: 97 mg/dL (ref 70–99)
Glucose-Capillary: 93 mg/dL (ref 70–99)
Glucose-Capillary: 349 mg/dL — ABNORMAL HIGH (ref 70–99)

## 2012-08-17 SURGERY — PARS PLANA VITRECTOMY WITH 25 GAUGE
Anesthesia: General | Site: Eye | Laterality: Left | Wound class: Clean

## 2012-08-17 MED ORDER — HEMOSTATIC AGENTS (NO CHARGE) OPTIME
TOPICAL | Status: DC | PRN
  Administered 2012-08-17: 1 via TOPICAL

## 2012-08-17 MED ORDER — BSS IO SOLN
INTRAOCULAR | Status: DC | PRN
  Administered 2012-08-17: 15 mL via INTRAOCULAR

## 2012-08-17 MED ORDER — CYCLOPENTOLATE HCL 1 % OP SOLN
1.0000 [drp] | OPHTHALMIC | Status: AC | PRN
  Administered 2012-08-17 (×2): 1 [drp] via OPHTHALMIC

## 2012-08-17 MED ORDER — TEMAZEPAM 15 MG PO CAPS
15.0000 mg | ORAL_CAPSULE | Freq: Every evening | ORAL | Status: DC | PRN
  Administered 2012-08-18: 15 mg via ORAL
  Filled 2012-08-17: qty 1

## 2012-08-17 MED ORDER — INSULIN GLARGINE 100 UNIT/ML ~~LOC~~ SOLN
5.0000 [IU] | Freq: Every day | SUBCUTANEOUS | Status: DC
  Administered 2012-08-17: 5 [IU] via SUBCUTANEOUS

## 2012-08-17 MED ORDER — MUPIROCIN 2 % EX OINT
TOPICAL_OINTMENT | CUTANEOUS | Status: AC
  Administered 2012-08-17: 1 via NASAL
  Filled 2012-08-17: qty 22

## 2012-08-17 MED ORDER — HYDROMORPHONE HCL PF 1 MG/ML IJ SOLN
0.2500 mg | INTRAMUSCULAR | Status: DC | PRN
  Administered 2012-08-17: 0.5 mg via INTRAVENOUS

## 2012-08-17 MED ORDER — HYDROCHLOROTHIAZIDE 12.5 MG PO CAPS
12.5000 mg | ORAL_CAPSULE | Freq: Every day | ORAL | Status: DC
  Administered 2012-08-17: 12.5 mg via ORAL
  Filled 2012-08-17 (×2): qty 1

## 2012-08-17 MED ORDER — PREDNISOLONE ACETATE 1 % OP SUSP
1.0000 [drp] | Freq: Four times a day (QID) | OPHTHALMIC | Status: DC
  Filled 2012-08-17: qty 5
  Filled 2012-08-17: qty 1

## 2012-08-17 MED ORDER — LIDOCAINE HCL (CARDIAC) 20 MG/ML IV SOLN
INTRAVENOUS | Status: DC | PRN
  Administered 2012-08-17: 100 mg via INTRAVENOUS

## 2012-08-17 MED ORDER — ACETAZOLAMIDE SODIUM 500 MG IJ SOLR
500.0000 mg | Freq: Once | INTRAMUSCULAR | Status: AC
  Administered 2012-08-18: 500 mg via INTRAVENOUS
  Filled 2012-08-17: qty 500

## 2012-08-17 MED ORDER — MAGNESIUM HYDROXIDE 400 MG/5ML PO SUSP: 15.0000 mL | Freq: Four times a day (QID) | ORAL | Status: DC | PRN

## 2012-08-17 MED ORDER — SODIUM CHLORIDE 0.45 % IV SOLN
INTRAVENOUS | Status: DC
  Administered 2012-08-17: 20 mL/h via INTRAVENOUS

## 2012-08-17 MED ORDER — SODIUM CHLORIDE 0.9 % IV SOLN
INTRAVENOUS | Status: DC
  Administered 2012-08-17: 13:00:00 via INTRAVENOUS

## 2012-08-17 MED ORDER — LATANOPROST 0.005 % OP SOLN
1.0000 [drp] | Freq: Every day | OPHTHALMIC | Status: DC
  Filled 2012-08-17: qty 2.5

## 2012-08-17 MED ORDER — BACITRACIN-POLYMYXIN B 500-10000 UNIT/GM OP OINT
TOPICAL_OINTMENT | OPHTHALMIC | Status: DC | PRN
  Administered 2012-08-17: 1 via OPHTHALMIC

## 2012-08-17 MED ORDER — PROPOFOL 10 MG/ML IV BOLUS
INTRAVENOUS | Status: DC | PRN
  Administered 2012-08-17: 200 mg via INTRAVENOUS

## 2012-08-17 MED ORDER — ATROPINE SULFATE 1 % OP SOLN
OPHTHALMIC | Status: DC | PRN
  Administered 2012-08-17: 2 [drp] via OPHTHALMIC

## 2012-08-17 MED ORDER — ONDANSETRON HCL 4 MG/2ML IJ SOLN: 4.0000 mg | Freq: Four times a day (QID) | INTRAMUSCULAR | Status: DC | PRN

## 2012-08-17 MED ORDER — HYDROMORPHONE HCL PF 1 MG/ML IJ SOLN
INTRAMUSCULAR | Status: AC
  Filled 2012-08-17: qty 1

## 2012-08-17 MED ORDER — OXYCODONE HCL 5 MG PO TABS: 5.0000 mg | ORAL_TABLET | Freq: Once | ORAL | Status: DC | PRN

## 2012-08-17 MED ORDER — ONDANSETRON HCL 4 MG/2ML IJ SOLN
INTRAMUSCULAR | Status: DC | PRN
  Administered 2012-08-17: 4 mg via INTRAVENOUS

## 2012-08-17 MED ORDER — GLYCOPYRROLATE 0.2 MG/ML IJ SOLN
INTRAMUSCULAR | Status: DC | PRN
  Administered 2012-08-17: 0.6 mg via INTRAVENOUS

## 2012-08-17 MED ORDER — LIDOCAINE HCL 4 % MT SOLN
OROMUCOSAL | Status: DC | PRN
  Administered 2012-08-17: 4 mL via TOPICAL

## 2012-08-17 MED ORDER — ROCURONIUM BROMIDE 100 MG/10ML IV SOLN
INTRAVENOUS | Status: DC | PRN
  Administered 2012-08-17: 40 mg via INTRAVENOUS

## 2012-08-17 MED ORDER — DEXAMETHASONE SODIUM PHOSPHATE 10 MG/ML IJ SOLN
INTRAMUSCULAR | Status: DC | PRN
  Administered 2012-08-17: 10 mg

## 2012-08-17 MED ORDER — EPINEPHRINE HCL 1 MG/ML IJ SOLN
INTRAMUSCULAR | Status: AC
  Filled 2012-08-17: qty 1

## 2012-08-17 MED ORDER — PROMETHAZINE HCL 25 MG/ML IJ SOLN: 6.2500 mg | INTRAMUSCULAR | Status: DC | PRN

## 2012-08-17 MED ORDER — OXYCODONE HCL 5 MG/5ML PO SOLN: 5.0000 mg | Freq: Once | ORAL | Status: DC | PRN

## 2012-08-17 MED ORDER — BRIMONIDINE TARTRATE 0.2 % OP SOLN
1.0000 [drp] | Freq: Two times a day (BID) | OPHTHALMIC | Status: DC
  Filled 2012-08-17: qty 5

## 2012-08-17 MED ORDER — SODIUM CHLORIDE 0.9 % IJ SOLN
INTRAMUSCULAR | Status: DC | PRN
  Administered 2012-08-17: 14:00:00

## 2012-08-17 MED ORDER — VITAMIN C 500 MG PO TABS
1000.0000 mg | ORAL_TABLET | Freq: Every day | ORAL | Status: DC
  Filled 2012-08-17 (×2): qty 2

## 2012-08-17 MED ORDER — HYDROCODONE-ACETAMINOPHEN 5-325 MG PO TABS
1.0000 | ORAL_TABLET | ORAL | Status: DC | PRN
  Administered 2012-08-18: 2 via ORAL
  Filled 2012-08-17: qty 2

## 2012-08-17 MED ORDER — NEOSTIGMINE METHYLSULFATE 1 MG/ML IJ SOLN
INTRAMUSCULAR | Status: DC | PRN
  Administered 2012-08-17: 4 mg via INTRAVENOUS

## 2012-08-17 MED ORDER — EPINEPHRINE HCL 1 MG/ML IJ SOLN
INTRAOCULAR | Status: DC | PRN
  Administered 2012-08-17: 14:00:00

## 2012-08-17 MED ORDER — FENTANYL CITRATE 0.05 MG/ML IJ SOLN
INTRAMUSCULAR | Status: DC | PRN
  Administered 2012-08-17: 75 ug via INTRAVENOUS
  Administered 2012-08-17: 125 ug via INTRAVENOUS
  Administered 2012-08-17: 50 ug via INTRAVENOUS

## 2012-08-17 MED ORDER — BUPIVACAINE HCL (PF) 0.75 % IJ SOLN
INTRAMUSCULAR | Status: DC | PRN
  Administered 2012-08-17: 10 mL

## 2012-08-17 MED ORDER — GATIFLOXACIN 0.5 % OP SOLN
1.0000 [drp] | Freq: Four times a day (QID) | OPHTHALMIC | Status: DC
  Filled 2012-08-17: qty 2.5

## 2012-08-17 MED ORDER — VITAMIN D3 25 MCG (1000 UNIT) PO TABS
2000.0000 [IU] | ORAL_TABLET | Freq: Every day | ORAL | Status: DC
  Administered 2012-08-17: 2000 [IU] via ORAL
  Filled 2012-08-17 (×2): qty 2

## 2012-08-17 MED ORDER — MORPHINE SULFATE 2 MG/ML IJ SOLN: 1.0000 mg | INTRAMUSCULAR | Status: DC | PRN

## 2012-08-17 MED ORDER — MIDAZOLAM HCL 5 MG/5ML IJ SOLN
INTRAMUSCULAR | Status: DC | PRN
  Administered 2012-08-17: 2 mg via INTRAVENOUS

## 2012-08-17 MED ORDER — EPHEDRINE SULFATE 50 MG/ML IJ SOLN
INTRAMUSCULAR | Status: DC | PRN
  Administered 2012-08-17: 10 mg via INTRAVENOUS
  Administered 2012-08-17: 5 mg via INTRAVENOUS

## 2012-08-17 MED ORDER — TROPICAMIDE 1 % OP SOLN
1.0000 [drp] | OPHTHALMIC | Status: AC | PRN
  Administered 2012-08-17 (×2): 1 [drp] via OPHTHALMIC

## 2012-08-17 MED ORDER — DOCUSATE SODIUM 100 MG PO CAPS
100.0000 mg | ORAL_CAPSULE | Freq: Two times a day (BID) | ORAL | Status: DC
  Administered 2012-08-17: 100 mg via ORAL
  Filled 2012-08-17: qty 1

## 2012-08-17 MED ORDER — ADULT MULTIVITAMIN W/MINERALS CH
1.0000 | ORAL_TABLET | Freq: Every day | ORAL | Status: DC
  Administered 2012-08-17: 1 via ORAL
  Filled 2012-08-17 (×2): qty 1

## 2012-08-17 MED ORDER — LISINOPRIL 20 MG PO TABS
30.0000 mg | ORAL_TABLET | Freq: Every day | ORAL | Status: DC
  Administered 2012-08-17: 30 mg via ORAL
  Filled 2012-08-17 (×2): qty 1

## 2012-08-17 MED ORDER — ACETAMINOPHEN 325 MG PO TABS: 325.0000 mg | ORAL_TABLET | ORAL | Status: DC | PRN

## 2012-08-17 MED ORDER — PROVISC 10 MG/ML IO SOLN
INTRAOCULAR | Status: DC | PRN
  Administered 2012-08-17: 8.5 mg via INTRAOCULAR

## 2012-08-17 MED ORDER — BACITRACIN-POLYMYXIN B 500-10000 UNIT/GM OP OINT
1.0000 "application " | TOPICAL_OINTMENT | Freq: Four times a day (QID) | OPHTHALMIC | Status: DC
  Filled 2012-08-17: qty 3.5

## 2012-08-17 MED ORDER — CYCLOBENZAPRINE HCL 10 MG PO TABS: 10.0000 mg | ORAL_TABLET | Freq: Three times a day (TID) | ORAL | Status: DC | PRN

## 2012-08-17 MED ORDER — CEFAZOLIN SODIUM 1-5 GM-% IV SOLN
INTRAVENOUS | Status: AC
  Filled 2012-08-17: qty 50

## 2012-08-17 MED ORDER — METFORMIN HCL 500 MG PO TABS
500.0000 mg | ORAL_TABLET | Freq: Two times a day (BID) | ORAL | Status: DC
  Administered 2012-08-17 – 2012-08-18 (×2): 500 mg via ORAL
  Filled 2012-08-17 (×4): qty 1

## 2012-08-17 MED ORDER — PHENYLEPHRINE HCL 2.5 % OP SOLN
1.0000 [drp] | OPHTHALMIC | Status: AC | PRN
  Administered 2012-08-17 (×2): 1 [drp] via OPHTHALMIC

## 2012-08-17 MED ORDER — INSULIN ASPART 100 UNIT/ML ~~LOC~~ SOLN
0.0000 [IU] | SUBCUTANEOUS | Status: DC
  Administered 2012-08-17: 11 [IU] via SUBCUTANEOUS
  Administered 2012-08-18: 5 [IU] via SUBCUTANEOUS
  Administered 2012-08-18: 8 [IU] via SUBCUTANEOUS
  Administered 2012-08-18: 15 [IU] via SUBCUTANEOUS

## 2012-08-17 MED ORDER — CEFAZOLIN SODIUM-DEXTROSE 2-3 GM-% IV SOLR
INTRAVENOUS | Status: AC
  Filled 2012-08-17: qty 50

## 2012-08-17 MED ORDER — TETRACAINE HCL 0.5 % OP SOLN
2.0000 [drp] | Freq: Once | OPHTHALMIC | Status: DC
  Filled 2012-08-17: qty 2

## 2012-08-17 SURGICAL SUPPLY — 61 items
APPLICATOR DR MATTHEWS STRL (MISCELLANEOUS) IMPLANT
BLADE EYE CATARACT 19 1.4 BEAV (BLADE) IMPLANT
BLADE MVR KNIFE 19G (BLADE) IMPLANT
BLADE MVR KNIFE 20G (BLADE) IMPLANT
CANNULA DUAL BORE 23G (CANNULA) IMPLANT
CANNULA FLEX TIP 25G (CANNULA) IMPLANT
CLOTH BEACON ORANGE TIMEOUT ST (SAFETY) ×2 IMPLANT
CORDS BIPOLAR (ELECTRODE) ×2 IMPLANT
COTTONBALL LRG STERILE PKG (GAUZE/BANDAGES/DRESSINGS) ×6 IMPLANT
DRAPE INCISE 51X51 W/FILM STRL (DRAPES) ×2 IMPLANT
DRAPE OPHTHALMIC 77X100 STRL (CUSTOM PROCEDURE TRAY) ×2 IMPLANT
FILTER BLUE MILLIPORE (MISCELLANEOUS) IMPLANT
FILTER STRAW FLUID ASPIR (MISCELLANEOUS) IMPLANT
FORCEPS ECKARDT ILM 25G SERR (OPHTHALMIC RELATED) IMPLANT
GLOVE SS BIOGEL STRL SZ 6.5 (GLOVE) ×1 IMPLANT
GLOVE SS BIOGEL STRL SZ 7 (GLOVE) ×1 IMPLANT
GLOVE SUPERSENSE BIOGEL SZ 6.5 (GLOVE) ×1
GLOVE SUPERSENSE BIOGEL SZ 7 (GLOVE) ×1
GLOVE SURG 8.5 LATEX PF (GLOVE) ×2 IMPLANT
GOWN STRL NON-REIN LRG LVL3 (GOWN DISPOSABLE) ×6 IMPLANT
ILLUMINATOR CHOW PICK 25GA (MISCELLANEOUS) ×2 IMPLANT
KIT BASIN OR (CUSTOM PROCEDURE TRAY) ×2 IMPLANT
KNIFE CRESCENT 2.5 55 ANG (BLADE) IMPLANT
LENS BIOM SUPER VIEW SET DISP (OPHTHALMIC RELATED) IMPLANT
MARKER SKIN DUAL TIP RULER LAB (MISCELLANEOUS) IMPLANT
MASK EYE SHIELD (GAUZE/BANDAGES/DRESSINGS) ×2 IMPLANT
MICROPICK 25G (MISCELLANEOUS)
NEEDLE 18GX1X1/2 (RX/OR ONLY) (NEEDLE) ×2 IMPLANT
NEEDLE 25GX 5/8IN NON SAFETY (NEEDLE) ×2 IMPLANT
NEEDLE 27GAX1X1/2 (NEEDLE) IMPLANT
NEEDLE FILTER BLUNT 18X 1/2SAF (NEEDLE) ×1
NEEDLE FILTER BLUNT 18X1 1/2 (NEEDLE) ×1 IMPLANT
NEEDLE HYPO 30X.5 LL (NEEDLE) ×2 IMPLANT
NS IRRIG 1000ML POUR BTL (IV SOLUTION) ×2 IMPLANT
PACK VITRECTOMY CUSTOM (CUSTOM PROCEDURE TRAY) ×2 IMPLANT
PAD ARMBOARD 7.5X6 YLW CONV (MISCELLANEOUS) ×4 IMPLANT
PAD EYE OVAL STERILE LF (GAUZE/BANDAGES/DRESSINGS) ×2 IMPLANT
PAK VITRECTOMY PIK 25 GA (OPHTHALMIC RELATED) ×2 IMPLANT
PENCIL BIPOLAR 25GA STR DISP (OPHTHALMIC RELATED) ×2 IMPLANT
PICK MICROPICK 25G (MISCELLANEOUS) IMPLANT
PROBE DIRECTIONAL LASER (MISCELLANEOUS) IMPLANT
REPL STRA BRUSH NEEDLE (NEEDLE) IMPLANT
RESERVOIR BACK FLUSH (MISCELLANEOUS) IMPLANT
ROLLS DENTAL (MISCELLANEOUS) ×4 IMPLANT
SCRAPER DIAMOND DUST MEMBRANE (MISCELLANEOUS) IMPLANT
SPONGE SURGIFOAM ABS GEL 12-7 (HEMOSTASIS) ×2 IMPLANT
STOPCOCK 4 WAY LG BORE MALE ST (IV SETS) IMPLANT
SUT CHROMIC 7 0 TG140 8 (SUTURE) IMPLANT
SUT ETHILON 10 0 CS140 6 (SUTURE) IMPLANT
SUT ETHILON 9 0 TG140 8 (SUTURE) IMPLANT
SUT POLY NON ABSORB 10-0 8 STR (SUTURE) IMPLANT
SUT SILK 4 0 RB 1 (SUTURE) IMPLANT
SYR 20CC LL (SYRINGE) ×2 IMPLANT
SYR 5ML LL (SYRINGE) IMPLANT
SYR BULB 3OZ (MISCELLANEOUS) ×2 IMPLANT
SYR TB 1ML LUER SLIP (SYRINGE) ×2 IMPLANT
SYRINGE 10CC LL (SYRINGE) IMPLANT
TOWEL OR 17X24 6PK STRL BLUE (TOWEL DISPOSABLE) ×6 IMPLANT
TROCAR CANNULA 25GA (CANNULA) IMPLANT
WATER STERILE IRR 1000ML POUR (IV SOLUTION) ×2 IMPLANT
WIPE INSTRUMENT VISIWIPE 73X73 (MISCELLANEOUS) ×2 IMPLANT

## 2012-08-17 NOTE — Anesthesia Postprocedure Evaluation (Signed)
  Anesthesia Post-op Note  Patient: Kristina Adams  Procedure(s) Performed: Procedure(s) (LRB) with comments: PARS PLANA VITRECTOMY WITH 25 GAUGE (Left) REPAIR OF COMPLEX TRACTION RETINAL DETACHMENT (Left)  Patient Location: PACU  Anesthesia Type:General  Level of Consciousness: awake  Airway and Oxygen Therapy: Patient Spontanous Breathing  Post-op Pain: mild  Post-op Assessment: Post-op Vital signs reviewed, Patient's Cardiovascular Status Stable, Respiratory Function Stable, Patent Airway, No signs of Nausea or vomiting and Pain level controlled  Post-op Vital Signs: stable  Complications: No apparent anesthesia complications

## 2012-08-17 NOTE — H&P (Signed)
I examined the patient today and there is no change in the medical status 

## 2012-08-17 NOTE — Progress Notes (Signed)
Remaining eye drops to be given in holding. Report given to Alfonso Ramus, RN and Earley Favor, RN

## 2012-08-17 NOTE — Preoperative (Signed)
Beta Blockers   Reason not to administer Beta Blockers:Not Applicable 

## 2012-08-17 NOTE — OR Nursing (Signed)
Dr. Ashley Royalty made aware that patient did not have TED hose on. Confirmed they were ordered. Front desk notified 3rd time to please get TED hoses from Short stay.

## 2012-08-17 NOTE — Anesthesia Preprocedure Evaluation (Signed)
Anesthesia Evaluation  Patient identified by MRN, date of birth, ID band Patient awake    Reviewed: Allergy & Precautions, H&P , NPO status , Patient's Chart, lab work & pertinent test results  Airway Mallampati: I TM Distance: >3 FB Neck ROM: full    Dental   Pulmonary sleep apnea ,          Cardiovascular hypertension, Rhythm:regular Rate:Normal     Neuro/Psych  Headaches, PSYCHIATRIC DISORDERS    GI/Hepatic hiatal hernia, GERD-  ,  Endo/Other  diabetes, Type 2, Insulin Dependent and Oral Hypoglycemic AgentsHyperthyroidism   Renal/GU      Musculoskeletal   Abdominal   Peds  Hematology   Anesthesia Other Findings   Reproductive/Obstetrics                           Anesthesia Physical Anesthesia Plan  ASA: III  Anesthesia Plan: General   Post-op Pain Management:    Induction: Intravenous  Airway Management Planned: Oral ETT  Additional Equipment:   Intra-op Plan:   Post-operative Plan: Extubation in OR  Informed Consent: I have reviewed the patients History and Physical, chart, labs and discussed the procedure including the risks, benefits and alternatives for the proposed anesthesia with the patient or authorized representative who has indicated his/her understanding and acceptance.     Plan Discussed with: CRNA, Anesthesiologist and Surgeon  Anesthesia Plan Comments:         Anesthesia Quick Evaluation

## 2012-08-17 NOTE — Brief Op Note (Signed)
Brief Operative note   Preoperative diagnosis:  Pre-Op Diagnosis Codes:    * Traction detachment of retina [361.81]    * Vitreous hemorrhage [379.23]    * Proliferative diabetic retinopathy(362.02) [362.02] Postoperative diagnosis  Post-Op Diagnosis Codes:    * Traction detachment of retina [361.81]    * Vitreous hemorrhage [379.23]    * Proliferative diabetic retinopathy(362.02) [362.02]  Procedures: Repair of complex traction retinal detachment with vitrectomy and membrane peel and gas injection and laser.  Surgeon:  Sherrie George, MD...  Assistant:  Rosalie Doctor SA    Anesthesia: General  Specimen: none  Estimated blood loss:  1cc  Complications: none  Patient sent to PACU in good condition  Composed by Sherrie George MD  Dictation number: [id number]

## 2012-08-17 NOTE — Op Note (Signed)
NAMECAMARI, QUINTANILLA                 ACCOUNT NO.:  [id number]  MEDICAL RECORD NO.:  [id number]  LOCATION:  6N07C                        FACILITY:  [facility]  PHYSICIAN:  Beulah Gandy. Ashley Royalty, M.D. DATE OF BIRTH:  Oct 09, 1965  DATE OF PROCEDURE:  08/17/2012 DATE OF DISCHARGE:                              OPERATIVE REPORT   ADMISSION DIAGNOSES:  Traction retinal detachment, proliferative diabetic retinopathy, vitreous hemorrhage in the left eye.  PROCEDURE:  Repair of complex traction retinal detachment with pars plana vitrectomy, retinal photocoagulation, membrane peel, gas fluid exchange, endo-diathermy in the left eye, 25-gauge system.  SURGEON:  Beulah Gandy. Ashley Royalty, M.D.  ASSISTANT:  Rosalie Doctor, SA.  ANESTHESIA:  General.  DETAILS:  After usual prep and drape, 25-gauge trocars placed at 10, 2, and 4 o'clock infusion at 4 o'clock.  Provisc placed on the corneal surface and the Biome viewing lens was placed.  Pars plana vitrectomy was begun just behind the cataractous lens.  The vitrectomy was carried posteriorly in a core fashion down to the macular surface where the surface was thrown into folds because of traction retinal detachment. The posterior membranes were peeled from their attachments to the disk in the superior arcade, multiple segments were created in and trimmed down to the retinal surface with vitreous cutter.  Endo-diathermy was used for hemostasis.  A lighted pick was used as well as the 25-gauge forceps.  The vitrectomy was carried into the mid periphery where surface proliferation was carefully removed under low suction and rapid cutting.  The wide field biome viewing system was moved into place and vitrectomy was carried down to the vitreous base.  All blood was removed.  Scleral depression was used.  Once all the surface proliferation was removed, the endolaser was positioned in the eye, 876 burns were placed around the retinal periphery, the power 1500 mW,  1000 microns each and 0.1 seconds each.  60 % gas fluid exchange was performed.  The instruments were removed from the eye and the trocars were removed.  The wounds were tested and found to be secure.  Polymyxin and gentamicin were irrigated into [place] space.  No atropine was used. Marcaine was injected around the globe for postop pain Decadron 10 mg was injected into the lower subconjunctival space.  Polysporin ophthalmic ointment, a patch and shield were placed.  Closing pressure was 10 with a Risk manager.  Complications none.  Duration 1 hour. The patient awakened and taken to recovery in a satisfactory condition.    Beulah Gandy. Ashley Royalty, M.D.    JDM/MEDQ  D:  08/17/2012  T:  08/17/2012  Job:  [id number]

## 2012-08-17 NOTE — OR Nursing (Signed)
Patient allergic to Iohexol. MD aware. Betadine scrub and paint requested by Dr. Ashley Royalty for prepping.

## 2012-08-17 NOTE — Transfer of Care (Signed)
Immediate Anesthesia Transfer of Care Note  Patient: Kristina Adams  Procedure(s) Performed: Procedure(s) (LRB) with comments: PARS PLANA VITRECTOMY WITH 25 GAUGE (Left) REPAIR OF COMPLEX TRACTION RETINAL DETACHMENT (Left)  Patient Location: PACU  Anesthesia Type:General  Level of Consciousness: awake, alert  and oriented  Airway & Oxygen Therapy: Patient Spontanous Breathing and Patient connected to nasal cannula oxygen  Post-op Assessment: Report given to PACU RN and Post -op Vital signs reviewed and stable  Post vital signs: Reviewed and stable  Complications: No apparent anesthesia complications

## 2012-08-18 LAB — GLUCOSE, CAPILLARY: Glucose-Capillary: 299 mg/dL — ABNORMAL HIGH (ref 70–99)

## 2012-08-18 MED ORDER — PREDNISOLONE ACETATE 1 % OP SUSP: 1.0000 [drp] | Freq: Four times a day (QID) | OPHTHALMIC | Status: DC

## 2012-08-18 MED ORDER — GATIFLOXACIN 0.5 % OP SOLN: 1.0000 [drp] | Freq: Four times a day (QID) | OPHTHALMIC | Status: DC

## 2012-08-18 MED ORDER — BACITRACIN-POLYMYXIN B 500-10000 UNIT/GM OP OINT: 1.0000 "application " | TOPICAL_OINTMENT | Freq: Four times a day (QID) | OPHTHALMIC | Status: DC

## 2012-08-18 NOTE — Progress Notes (Signed)
08/18/2012, 6:40 AM  Mental Status:  Awake, Alert, Oriented  Anterior segment: Cornea  Clear    Anterior Chamber Clear    Lens:   IOL,  Intra Ocular Pressure 216 mmHg with Tonopen  Vitreous: Clear 20%gas bubble   Retina:  Attached Good laser reaction   Impression: Excellent result Retina attached  Final Diagnosis: Principal Problem:  *Traction retinal detachment   Plan: start post operative eye drops.  Discharge to home.  Give post operative instructions  Sherrie George 08/18/2012, 6:40 AM

## 2012-08-18 NOTE — Discharge Summary (Signed)
Discharge summary not needed on [facility] patients per medical records. 

## 2012-08-21 ENCOUNTER — Encounter (HOSPITAL_COMMUNITY): Payer: Self-pay | Admitting: Ophthalmology

## 2012-08-23 ENCOUNTER — Encounter (INDEPENDENT_AMBULATORY_CARE_PROVIDER_SITE_OTHER): Payer: Federal, State, Local not specified - PPO | Admitting: Ophthalmology

## 2012-08-23 DIAGNOSIS — H3340 Traction detachment of retina, unspecified eye: Secondary | ICD-10-CM

## 2012-08-24 ENCOUNTER — Encounter (INDEPENDENT_AMBULATORY_CARE_PROVIDER_SITE_OTHER): Payer: Federal, State, Local not specified - PPO | Admitting: Ophthalmology

## 2012-09-15 ENCOUNTER — Encounter (INDEPENDENT_AMBULATORY_CARE_PROVIDER_SITE_OTHER): Payer: Federal, State, Local not specified - PPO | Admitting: Ophthalmology

## 2012-09-25 ENCOUNTER — Encounter (INDEPENDENT_AMBULATORY_CARE_PROVIDER_SITE_OTHER): Payer: Federal, State, Local not specified - PPO | Admitting: Ophthalmology

## 2012-10-05 ENCOUNTER — Encounter (INDEPENDENT_AMBULATORY_CARE_PROVIDER_SITE_OTHER): Payer: Self-pay | Admitting: Ophthalmology

## 2013-05-25 ENCOUNTER — Other Ambulatory Visit: Payer: Self-pay | Admitting: Obstetrics and Gynecology

## 2013-11-06 DIAGNOSIS — E042 Nontoxic multinodular goiter: Secondary | ICD-10-CM | POA: Insufficient documentation

## 2014-06-13 ENCOUNTER — Telehealth: Payer: Self-pay | Admitting: Hematology

## 2014-06-13 NOTE — Telephone Encounter (Signed)
S/W PATIENT AND GAVE NP APPT FOR 12/28 @ 10:30 W/DR. FENG.  REFERRING DR. Colletta Maryland POWERS UNSPECIFIED ANEMIA

## 2014-07-08 ENCOUNTER — Telehealth: Payer: Self-pay | Admitting: Hematology

## 2014-07-08 ENCOUNTER — Other Ambulatory Visit: Payer: Federal, State, Local not specified - PPO

## 2014-07-08 ENCOUNTER — Encounter: Payer: Self-pay | Admitting: Hematology

## 2014-07-08 ENCOUNTER — Ambulatory Visit (HOSPITAL_BASED_OUTPATIENT_CLINIC_OR_DEPARTMENT_OTHER): Payer: Federal, State, Local not specified - PPO | Admitting: Hematology

## 2014-07-08 ENCOUNTER — Ambulatory Visit: Payer: Federal, State, Local not specified - PPO

## 2014-07-08 ENCOUNTER — Ambulatory Visit (HOSPITAL_BASED_OUTPATIENT_CLINIC_OR_DEPARTMENT_OTHER): Payer: Federal, State, Local not specified - PPO

## 2014-07-08 VITALS — BP 150/78 | HR 69 | Temp 98.3°F | Resp 20 | Ht 70.0 in | Wt 314.2 lb

## 2014-07-08 DIAGNOSIS — I10 Essential (primary) hypertension: Secondary | ICD-10-CM

## 2014-07-08 DIAGNOSIS — D649 Anemia, unspecified: Secondary | ICD-10-CM

## 2014-07-08 DIAGNOSIS — D509 Iron deficiency anemia, unspecified: Secondary | ICD-10-CM

## 2014-07-08 DIAGNOSIS — E669 Obesity, unspecified: Secondary | ICD-10-CM

## 2014-07-08 DIAGNOSIS — E119 Type 2 diabetes mellitus without complications: Secondary | ICD-10-CM

## 2014-07-08 LAB — CBC & DIFF AND RETIC
BASO%: 1 % (ref 0.0–2.0)
Basophils Absolute: 0.1 10*3/uL (ref 0.0–0.1)
EOS ABS: 0.2 10*3/uL (ref 0.0–0.5)
EOS%: 3 % (ref 0.0–7.0)
HCT: 31.7 % — ABNORMAL LOW (ref 34.8–46.6)
HEMOGLOBIN: 9.7 g/dL — AB (ref 11.6–15.9)
IMMATURE RETIC FRACT: 5.5 % (ref 1.60–10.00)
LYMPH#: 1.2 10*3/uL (ref 0.9–3.3)
LYMPH%: 24.6 % (ref 14.0–49.7)
MCH: 24.7 pg — AB (ref 25.1–34.0)
MCHC: 30.6 g/dL — ABNORMAL LOW (ref 31.5–36.0)
MCV: 80.7 fL (ref 79.5–101.0)
MONO#: 0.3 10*3/uL (ref 0.1–0.9)
MONO%: 5.2 % (ref 0.0–14.0)
NEUT%: 66.2 % (ref 38.4–76.8)
NEUTROS ABS: 3.3 10*3/uL (ref 1.5–6.5)
Platelets: 309 10*3/uL (ref 145–400)
RBC: 3.93 10*6/uL (ref 3.70–5.45)
RDW: 14.4 % (ref 11.2–14.5)
RETIC %: 0.86 % (ref 0.70–2.10)
Retic Ct Abs: 33.8 10*3/uL (ref 33.70–90.70)
WBC: 5 10*3/uL (ref 3.9–10.3)

## 2014-07-08 LAB — COMPREHENSIVE METABOLIC PANEL (CC13)
ALBUMIN: 3.6 g/dL (ref 3.5–5.0)
ALK PHOS: 63 U/L (ref 40–150)
ALT: 13 U/L (ref 0–55)
AST: 10 U/L (ref 5–34)
Anion Gap: 7 mEq/L (ref 3–11)
BUN: 13.4 mg/dL (ref 7.0–26.0)
CHLORIDE: 103 meq/L (ref 98–109)
CO2: 28 mEq/L (ref 22–29)
Calcium: 8.8 mg/dL (ref 8.4–10.4)
Creatinine: 0.8 mg/dL (ref 0.6–1.1)
GLUCOSE: 239 mg/dL — AB (ref 70–140)
POTASSIUM: 4.6 meq/L (ref 3.5–5.1)
SODIUM: 138 meq/L (ref 136–145)
TOTAL PROTEIN: 6.8 g/dL (ref 6.4–8.3)
Total Bilirubin: 0.2 mg/dL (ref 0.20–1.20)

## 2014-07-08 LAB — IRON AND TIBC CHCC
%SAT: 8 % — AB (ref 21–57)
IRON: 28 ug/dL — AB (ref 41–142)
TIBC: 334 ug/dL (ref 236–444)
UIBC: 306 ug/dL (ref 120–384)

## 2014-07-08 LAB — MORPHOLOGY
PLT EST: ADEQUATE
Platelet Morphology: NORMAL

## 2014-07-08 LAB — LACTATE DEHYDROGENASE (CC13): LDH: 127 U/L (ref 125–245)

## 2014-07-08 LAB — FERRITIN CHCC: Ferritin: 17 ng/ml (ref 9–269)

## 2014-07-08 NOTE — Progress Notes (Signed)
[facility]  [phone] Fax:([phone]) [place] consult Note   Patient Care Team: Doree Fudge, MD as PCP - General (Family Medicine) 07/08/2014  CHIEF COMPLAINTS/PURPOSE OF CONSULTATION:  Anemia   HISTORY OF PRESENTING ILLNESS:  Kristina Adams 48 y.o. female is here because of her chronic anemia.   She was found to be anemic about 15 years ago, our electronic medical records shows she has anemia at least back to December 2011, with hemoglobin in the range of 8-10.8, MCV in the high 70s to low 80s range. Her white count and platelet count are usually normal, with a few occasions with slightly elevated every BC and platelet count.   She has period every 28 days, except last month she had twice. It lasts 7 days, very heavy in the first two days, she changes every 3 hours.  She denied any bleeding episodes including hematochezia, melana, hemoptysis, hematuria or epitaxis. No mucosal bleeding or easy bruising.  She used to take oral iron but could not tolerated due to the constipation. She had EGD and EUS twice in 2009, which showed submucosal lesion close to the GE junction measuring 2-3 cm, stable. Biopsy was negative twice. Per patient, she had colonoscopy about 45 years ago which was unremarkable. I do not have the colonoscopy report available.  She denies recent chest pain on exertion, shortness of breath on minimal exertion, pre-syncopal episodes, or palpitations. She had not noticed any recent bleeding such as epistaxis, hematuria or hematochezia The patient denies over the counter NSAID ingestion. She is not on antiplatelets agents. Her last colonoscopy none  She had no prior history or diagnosis of cancer. Her age appropriate screening programs are up-to-date. She denies any pica and eats a variety of diet. She never donated blood or received blood transfusion   She works for [employer] full time. She is pretty active. She feels well overall at  except  mild fatigue. She denies any pain, nausea, cough, change of her bowel habits, or any recent episodes of fever chill, no recent weight loss.    MEDICAL HISTORY:  Past Medical History  Diagnosis Date  . Diabetes mellitus   . DDD (degenerative disc disease)   . Chronic back pain 12/03/2011  . Appendicitis, acute 12/03/2011  . Diabetes mellitus type 2, insulin dependent 12/03/2011  . Morbid obesity with BMI of 45.0-49.9, adult 12/03/2011  . Blood transfusion   . Thyroid disease   . Hypertension     sees Dr. Darron Doom, [facility], [place]  . Hypertension 12/03/2011  . Sleep apnea 12/03/2011    last sleep study May 2013  . Urinary tract bacterial infections     hx of  . GERD (gastroesophageal reflux disease)   . H/O hiatal hernia   . Headache   . Anemia 12/09/2011    child birth  . Degenerative disc disease, cervical     low back area    SURGICAL HISTORY: Past Surgical History  Procedure Laterality Date  . Cesarean section    . Laparoscopic appendectomy  12/03/2011    Procedure: APPENDECTOMY LAPAROSCOPIC;  Surgeon: [name], DO;  Location: [facility];  Service: General;  Laterality: N/A;  drainage of intra-abdominal abscess   . Appendectomy    . Tubal ligation    . Vitrectomy    . Pars plana vitrectomy Left 08/17/2012    Procedure: PARS PLANA VITRECTOMY WITH 25 GAUGE;  Surgeon: [name], MD;  Location: [facility];  Service: Ophthalmology;  Laterality: Left;  . Repair of complex traction retinal detachment Left 08/17/2012    Procedure: REPAIR OF COMPLEX TRACTION RETINAL DETACHMENT;  Surgeon: [name], MD;  Location: [facility];  Service: Ophthalmology;  Laterality: Left;    SOCIAL HISTORY: History   Social History  . Marital Status: Divorced    Spouse Name: N/A    Number of Children: 7 yo daughter and 22 yo  son  . Years of Education: N/A   Occupational History  . Not on file.   Social History Main Topics  . Smoking status: Never   . Smokeless  tobacco: Never Used  . Alcohol Use: 0.6 oz/week    1 Glasses of wine per week  . Drug Use: No  . Sexual Activity: Yes    Birth Control/ Protection: Pill   Other Topics Concern  . Not on file   Social History Narrative  . No narrative on file    FAMILY HISTORY: Family History  Problem Relation Age of Onset  . Cancer Mother   . Diabetes Other   . Cancer Other   . Hypertension Other     ALLERGIES:  is allergic to iohexol.  MEDICATIONS:  Current Outpatient Prescriptions  Medication Sig Dispense Refill  . cholecalciferol (VITAMIN D) 1000 UNITS tablet Take 2,000 Units by mouth daily.    . cyclobenzaprine (FLEXERIL) 10 MG tablet Take 10 mg by mouth 3 (three) times daily as needed.    . diphenhydramine-acetaminophen (TYLENOL PM) 25-500 MG TABS Take 1 tablet by mouth at bedtime as needed.    . glyBURIDE (DIABETA) 5 MG tablet Take 5 mg by mouth daily.    . hydrochlorothiazide (HYDRODIURIL) 25 MG tablet Take 25 mg by mouth daily.    . Insulin Glargine (LANTUS) 100 UNIT/ML Solostar Pen Inject 80 Units into the skin daily.    Marland Kitchen lisinopril (PRINIVIL,ZESTRIL) 40 MG tablet Take 40 mg by mouth daily.    . meloxicam (MOBIC) 15 MG tablet Take 15 mg by mouth daily.    . methimazole (TAPAZOLE) 5 MG tablet Take 10 mg by mouth daily.    . Multiple Vitamin (MULITIVITAMIN WITH MINERALS) TABS Take 1 tablet by mouth daily.    . vitamin C (ASCORBIC ACID) 500 MG tablet Take 1,000 mg by mouth daily.     No current facility-administered medications for this visit.    REVIEW OF SYSTEMS:   Constitutional: Denies fevers, chills or abnormal night sweats Eyes: Denies blurriness of vision, double vision or watery eyes Ears, nose, mouth, throat, and face: Denies mucositis or sore throat Respiratory: Denies cough, dyspnea or wheezes Cardiovascular: Denies palpitation, chest discomfort or lower extremity swelling Gastrointestinal:  Denies nausea, heartburn or change in bowel habits Skin: Denies abnormal  skin rashes Lymphatics: Denies new lymphadenopathy or easy bruising Neurological:Denies numbness, tingling or new weaknesses Behavioral/Psych: Mood is stable, no new changes  All other systems were reviewed with the patient and are negative.  PHYSICAL EXAMINATION: ECOG PERFORMANCE STATUS: 0 - Asymptomatic  Filed Vitals:   07/08/14 1112  BP: 150/78  Pulse: 69  Temp: 98.3 F (36.8 C)  Resp: 20   Filed Weights   07/08/14 1112  Weight: 314 lb 3.2 oz (142.52 kg)    GENERAL:alert, no distress and comfortable SKIN: skin color, texture, turgor are normal, no rashes or significant lesions EYES: normal, conjunctiva are pink and non-injected, sclera clear OROPHARYNX:no exudate, no erythema and lips, buccal mucosa, and tongue normal  NECK: supple, thyroid normal size, non-tender,  without nodularity LYMPH:  no palpable lymphadenopathy in the cervical, axillary or inguinal LUNGS: clear to auscultation and percussion with normal breathing effort HEART: regular rate & rhythm and no murmurs and no lower extremity edema ABDOMEN:abdomen soft, non-tender and normal bowel sounds Musculoskeletal:no cyanosis of digits and no clubbing  PSYCH: alert & oriented x 3 with fluent speech NEURO: no focal motor/sensory deficits  LABORATORY DATA:  I have reviewed the data as listed CBC Latest Ref Rng 08/17/2012 12/09/2011 12/07/2011  WBC 4.0 - 10.5 K/uL 7.8 12.2(H) 10.9(H)  Hemoglobin 12.0 - 15.0 g/dL 10.5(L) 8.7(L) 8.9(L)  Hematocrit 36.0 - 46.0 % 32.6(L) 27.8(L) 27.4(L)  Platelets 150 - 400 K/uL 389 496(H) 435(H)   Retic % 0.70 - 2.10 % 0.86      Retic Ct Abs 33.70 - 90.70 10e3/uL 33.80       CMP Latest Ref Rng 08/17/2012 12/09/2011 12/07/2011  Glucose 70 - 99 mg/dL 139(H) 218(H) 186(H)  BUN 6 - 23 mg/dL 16 5(L) 17  Creatinine 0.50 - 1.10 mg/dL 0.64 0.65 0.79  Sodium 135 - 145 mEq/L 138 135 135  Potassium 3.5 - 5.1 mEq/L 3.7 4.7 4.9  Chloride 96 - 112 mEq/L 101 99 103  CO2 19 - 32 mEq/L 28 27 22     Calcium 8.4 - 10.5 mg/dL 9.2 8.6 8.5  Total Protein 6.0 - 8.3 g/dL - 6.1 -  Total Bilirubin 0.3 - 1.2 mg/dL - 0.1(L) -  Alkaline Phos 39 - 117 U/L - 62 -  AST 0 - 37 U/L - 38(H) -  ALT 0 - 35 U/L - 17 -   Iron and TIBC [facility]  Status: Finalresult Visible to patient:  Not Released Nextappt: 07/29/2014 at 02:30 PM in On[facility], Krista Blue, MD) Dx:  Anemia, unspecified anemia type            Ref Range 4d ago    Iron 41 - 142 ug/dL 28 (L)   TIBC 236 - 444 ug/dL 334   UIBC 120 - 384 ug/dL 306   %SAT 21 - 57 % 8 (L)      Ferritin  Status: Finalresult Visible to patient:  Not Released Nextappt: 07/29/2014 at 02:30 PM in On[facility], Krista Blue, MD) Dx:  Anemia, unspecified anemia type         Ref Range 4d ago    Ferritin 9 - 269 ng/ml 17        RADIOGRAPHIC STUDIES: I have personally reviewed the radiological images as listed and agreed with the findings in the report. No results found.  ASSESSMENT & PLAN:  48 year old female with past medical history of hypertension, diabetes, obesity, and chronic anemia presents for evaluation of her anemia.  1. Microcytic hypo-productive anemia, likely iron deficient anemia secondary to menorrhagia -She has microcytic anemia with MCV in high 70 to low 80s, with low reticulocyte count, supports hypo-productive anemia. -Iron study showed low serum iron, low saturation and low ferritin (17), consistent with iron deficient anemia. This is likely secondary to her heavy menstrual period. Her prior GI workup was negative. -I would like to ruled out other causes of anemia also, we'll obtain a SPEP/IFE, LDH, Z00 and folic acid level, and erythropoietin level. -I do not think she needs bone marrow biopsy at this point. -She does not want continue oral iron, we'll give a trial of IV Feraheme, to see if her anemia improves.  2. Hypertension, diabetes, obesity She will continue follow-up with her primary care  physician for this medical issues.  PLAN -lab test today -returns in two weeks, with IV Feraheme infusion.    All questions were answered. The patient knows to call the clinic with any problems, questions or concerns. I spent 30 minutes counseling the patient face to face. The total time spent in the appointment was 40 minutes and more than 50% was on counseling.     Truitt Merle, MD 07/08/2014 12:07 PM

## 2014-07-08 NOTE — Progress Notes (Signed)
Checked in new pt with no financial concerns at this time.  Pt is here for a hematology concern so financial assistance may not be needed but she has Raquel's card for any questions or concerns.

## 2014-07-08 NOTE — Telephone Encounter (Signed)
Pt confirmed labs/ov per 12/28 POF, gave pt AVS.... KJ °

## 2014-07-10 LAB — PROTEIN ELECTROPHORESIS, SERUM, WITH REFLEX
ALBUMIN ELP: 55 % — AB (ref 55.8–66.1)
ALPHA-1-GLOBULIN: 4.7 % (ref 2.9–4.9)
ALPHA-2-GLOBULIN: 11.4 % (ref 7.1–11.8)
BETA 2: 5.9 % (ref 3.2–6.5)
BETA GLOBULIN: 5.8 % (ref 4.7–7.2)
Gamma Globulin: 17.2 % (ref 11.1–18.8)
Total Protein, Serum Electrophoresis: 6.7 g/dL (ref 6.0–8.3)

## 2014-07-10 LAB — ERYTHROPOIETIN: ERYTHROPOIETIN: 34.5 m[IU]/mL — AB (ref 2.6–18.5)

## 2014-07-10 LAB — HAPTOGLOBIN: Haptoglobin: 177 mg/dL (ref 45–215)

## 2014-07-10 LAB — FOLATE RBC: RBC FOLATE: 1096 ng/mL (ref >280)

## 2014-07-10 LAB — VITAMIN B12: Vitamin B-12: 515 pg/mL (ref 211–911)

## 2014-07-12 ENCOUNTER — Encounter: Payer: Self-pay | Admitting: Hematology

## 2014-07-18 ENCOUNTER — Telehealth: Payer: Self-pay | Admitting: Hematology

## 2014-07-18 NOTE — Telephone Encounter (Signed)
s/w pt re appts for 1/11 and 1/18. appt for 1/25 cxd as pt would prefer 1/11 and 1/18. per pof either combination ok.

## 2014-07-19 ENCOUNTER — Other Ambulatory Visit: Payer: Self-pay | Admitting: Obstetrics and Gynecology

## 2014-07-22 ENCOUNTER — Ambulatory Visit (HOSPITAL_BASED_OUTPATIENT_CLINIC_OR_DEPARTMENT_OTHER): Payer: Federal, State, Local not specified - PPO

## 2014-07-22 DIAGNOSIS — D509 Iron deficiency anemia, unspecified: Secondary | ICD-10-CM

## 2014-07-22 LAB — CYTOLOGY - PAP

## 2014-07-22 MED ORDER — FERUMOXYTOL INJECTION 510 MG/17 ML
510.0000 mg | Freq: Once | INTRAVENOUS | Status: AC
  Administered 2014-07-22: 510 mg via INTRAVENOUS
  Filled 2014-07-22: qty 17

## 2014-07-22 MED ORDER — SODIUM CHLORIDE 0.9 % IV SOLN
Freq: Once | INTRAVENOUS | Status: AC
  Administered 2014-07-22: 16:00:00 via INTRAVENOUS

## 2014-07-22 NOTE — Patient Instructions (Signed)

## 2014-07-29 ENCOUNTER — Ambulatory Visit (HOSPITAL_BASED_OUTPATIENT_CLINIC_OR_DEPARTMENT_OTHER): Payer: Federal, State, Local not specified - PPO | Admitting: Hematology

## 2014-07-29 ENCOUNTER — Encounter: Payer: Self-pay | Admitting: Hematology

## 2014-07-29 ENCOUNTER — Ambulatory Visit (HOSPITAL_BASED_OUTPATIENT_CLINIC_OR_DEPARTMENT_OTHER): Payer: Federal, State, Local not specified - PPO

## 2014-07-29 ENCOUNTER — Telehealth: Payer: Self-pay | Admitting: Hematology

## 2014-07-29 VITALS — BP 136/51 | HR 66 | Temp 98.0°F | Resp 18 | Ht 70.0 in | Wt 309.9 lb

## 2014-07-29 DIAGNOSIS — N920 Excessive and frequent menstruation with regular cycle: Secondary | ICD-10-CM

## 2014-07-29 DIAGNOSIS — D509 Iron deficiency anemia, unspecified: Secondary | ICD-10-CM

## 2014-07-29 MED ORDER — SODIUM CHLORIDE 0.9 % IV SOLN
510.0000 mg | Freq: Once | INTRAVENOUS | Status: AC
  Administered 2014-07-29: 510 mg via INTRAVENOUS
  Filled 2014-07-29: qty 17

## 2014-07-29 MED ORDER — SODIUM CHLORIDE 0.9 % IV SOLN
Freq: Once | INTRAVENOUS | Status: AC
  Administered 2014-07-29: 17:00:00 via INTRAVENOUS

## 2014-07-29 NOTE — Patient Instructions (Signed)

## 2014-07-29 NOTE — Telephone Encounter (Signed)
Gave avs & cal for March. °

## 2014-07-29 NOTE — Progress Notes (Signed)
[place]  Telephone:(336) [phone] Fax:(336) [place] consult Note   Patient Care Team: Doree Fudge, MD as PCP - General (Family Medicine) 07/29/2014  CHIEF COMPLAINTS/PURPOSE OF CONSULTATION:  Anemia of iron deficiency   HISTORY OF PRESENTING ILLNESS:  Kristina Adams 49 y.o. female is here because of her chronic anemia.   She was found to be anemic about 15 years ago, our electronic medical records shows she has anemia at least back to December 2011, with hemoglobin in the range of 8-10.8, MCV in the high 70s to low 80s range. Her white count and platelet count are usually normal, with a few occasions with slightly elevated every BC and platelet count.   She has period every 28 days, except last month she had twice. It lasts 7 days, very heavy in the first two days, she changes every 3 hours.  She denied any bleeding episodes including hematochezia, melana, hemoptysis, hematuria or epitaxis. No mucosal bleeding or easy bruising.  She used to take oral iron but could not tolerated due to the constipation. She had EGD and EUS twice in 2009, which showed submucosal lesion close to the GE junction measuring 2-3 cm, stable. Biopsy was negative twice. Per patient, she had colonoscopy about 45 years ago which was unremarkable. I do not have the colonoscopy report available.  She denies recent chest pain on exertion, shortness of breath on minimal exertion, pre-syncopal episodes, or palpitations. She had not noticed any recent bleeding such as epistaxis, hematuria or hematochezia The patient denies over the counter NSAID ingestion. She is not on antiplatelets agents. Her last colonoscopy none  She had no prior history or diagnosis of cancer. Her age appropriate screening programs are up-to-date. She denies any pica and eats a variety of diet. She never donated blood or received blood transfusion   She works for [employer] full time. She is pretty active. She  feels well overall at except  mild fatigue. She denies any pain, nausea, cough, change of her bowel habits, or any recent episodes of fever chill, no recent weight loss.  INTERIM HISTORY: She returns for follow up. She was seen by her GYN lately who suggested to have luplon for her fibroids. She had first iv Feraheme one week ago and tolerated it well. She is going to have one more infusion today. She feels about the same.   MEDICAL HISTORY:  Past Medical History  Diagnosis Date  . Diabetes mellitus   . DDD (degenerative disc disease)   . Chronic back pain 12/03/2011  . Appendicitis, acute 12/03/2011  . Diabetes mellitus type 2, insulin dependent 12/03/2011  . Morbid obesity with BMI of 45.0-49.9, adult 12/03/2011  . Blood transfusion   . Thyroid disease   . Hypertension     sees Dr. Darron Doom, [facility], [place]  . Hypertension 12/03/2011  . Sleep apnea 12/03/2011    last sleep study May 2013  . Urinary tract bacterial infections     hx of  . GERD (gastroesophageal reflux disease)   . H/O hiatal hernia   . Headache(784.0)   . Anemia 12/09/2011    child birth  . Degenerative disc disease, cervical     low back area    SURGICAL HISTORY: Past Surgical History  Procedure Laterality Date  . Cesarean section    . Laparoscopic appendectomy  12/03/2011    Procedure: APPENDECTOMY LAPAROSCOPIC;  Surgeon: [name], DO;  Location: [facility];  Service: General;  Laterality:  N/A;  drainage of intra-abdominal abscess   . Appendectomy    . Tubal ligation    . Vitrectomy    . Pars plana vitrectomy Left 08/17/2012    Procedure: PARS PLANA VITRECTOMY WITH 25 GAUGE;  Surgeon: [name], MD;  Location: [facility];  Service: Ophthalmology;  Laterality: Left;  . 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 Marital Status: Divorced    Spouse Name: N/A    Number of Children: 71 yo daughter and 64 yo  son  . Years of Education: N/A   Occupational History  . Not on file.   Social History Main Topics  . Smoking status: Never   . Smokeless tobacco: Never Used  . Alcohol Use: 0.6 oz/week    1 Glasses of wine per week  . Drug Use: No  . Sexual Activity: Yes    Birth Control/ Protection: Pill   Other Topics Concern  . Not on file   Social History Narrative  . No narrative on file    FAMILY HISTORY: Family History  Problem Relation Age of Onset  . Cancer Mother 19    breast cancer   . Diabetes Other   . Cancer Other   . Hypertension Other     ALLERGIES:  is allergic to iohexol.  MEDICATIONS:  Current Outpatient Prescriptions  Medication Sig Dispense Refill  . cholecalciferol (VITAMIN D) 1000 UNITS tablet Take 2,000 Units by mouth daily.    . cyclobenzaprine (FLEXERIL) 10 MG tablet Take 10 mg by mouth 3 (three) times daily as needed.    . diphenhydramine-acetaminophen (TYLENOL PM) 25-500 MG TABS Take 1 tablet by mouth at bedtime as needed.    . glyBURIDE (DIABETA) 5 MG tablet Take 5 mg by mouth daily.    . hydrochlorothiazide (HYDRODIURIL) 25 MG tablet Take 25 mg by mouth daily.    . Insulin Glargine (LANTUS) 100 UNIT/ML Solostar Pen Inject 80 Units into the skin daily.    Marland Kitchen lisinopril (PRINIVIL,ZESTRIL) 40 MG tablet Take 40 mg by mouth daily.    . meloxicam (MOBIC) 15 MG tablet Take 15 mg by mouth daily.    . methimazole (TAPAZOLE) 5 MG tablet Take 10 mg by mouth daily.    . Multiple Vitamin (MULITIVITAMIN WITH MINERALS) TABS Take 1 tablet by mouth daily.    . vitamin C (ASCORBIC ACID) 500 MG tablet Take 1,000 mg by mouth daily.     No current facility-administered medications for this visit.    REVIEW OF SYSTEMS:   Constitutional: Denies fevers, chills or abnormal night sweats Eyes: Denies blurriness of vision, double  vision or watery eyes Ears, nose, mouth, throat, and face: Denies mucositis or sore throat Respiratory: Denies cough, dyspnea or wheezes Cardiovascular: Denies palpitation, chest discomfort or lower extremity swelling Gastrointestinal:  Denies nausea, heartburn or change in bowel habits Skin: Denies abnormal skin rashes Lymphatics: Denies new lymphadenopathy or easy bruising Neurological:Denies numbness, tingling or new weaknesses Behavioral/Psych: Mood is stable, no new changes  All other systems were reviewed with the patient and are negative.  PHYSICAL EXAMINATION: ECOG PERFORMANCE STATUS: 0 - Asymptomatic  Filed Vitals:   07/29/14 1452  BP: 136/51  Pulse: 66  Temp: 98 F (36.7 C)  Resp: 18   Filed Weights  07/29/14 1452  Weight: 309 lb 14.4 oz (140.57 kg)    GENERAL:alert, no distress and comfortable SKIN: skin color, texture, turgor are normal, no rashes or significant lesions EYES: normal, conjunctiva are pink and non-injected, sclera clear OROPHARYNX:no exudate, no erythema and lips, buccal mucosa, and tongue normal  NECK: supple, thyroid normal size, non-tender, without nodularity LYMPH:  no palpable lymphadenopathy in the cervical, axillary or inguinal LUNGS: clear to auscultation and percussion with normal breathing effort HEART: regular rate & rhythm and no murmurs and no lower extremity edema ABDOMEN:abdomen soft, non-tender and normal bowel sounds Musculoskeletal:no cyanosis of digits and no clubbing  PSYCH: alert & oriented x 3 with fluent speech NEURO: no focal motor/sensory deficits  LABORATORY DATA:  I have reviewed the data as listed CBC Latest Ref Rng 07/08/2014 08/17/2012 12/09/2011  WBC 3.9 - 10.3 10e3/uL 5.0 7.8 12.2(H)  Hemoglobin 11.6 - 15.9 g/dL 9.7(L) 10.5(L) 8.7(L)  Hematocrit 34.8 - 46.6 % 31.7(L) 32.6(L) 27.8(L)  Platelets 145 - 400 10e3/uL 309 389 496(H)   Retic % 0.70 - 2.10 % 0.86      Retic Ct Abs 33.70 - 90.70 10e3/uL 33.80        CMP Latest Ref Rng 07/08/2014 08/17/2012 12/09/2011  Glucose 70 - 140 mg/dl 239(H) 139(H) 218(H)  BUN 7.0 - 26.0 mg/dL 13.4 16 5(L)  Creatinine 0.6 - 1.1 mg/dL 0.8 0.64 0.65  Sodium 136 - 145 mEq/L 138 138 135  Potassium 3.5 - 5.1 mEq/L 4.6 3.7 4.7  Chloride 96 - 112 mEq/L - 101 99  CO2 22 - 29 mEq/L 28 28 27   Calcium 8.4 - 10.4 mg/dL 8.8 9.2 8.6  Total Protein 6.4 - 8.3 g/dL 6.8 - 6.1  Total Bilirubin 0.20 - 1.20 mg/dL 0.20 - 0.1(L)  Alkaline Phos 40 - 150 U/L 63 - 62  AST 5 - 34 U/L 10 - 38(H)  ALT 0 - 55 U/L 13 - 17       Results for SHIRLETTE, SCARBER (MRN [id number]) as of 07/29/2014 15:54  Ref. Range 07/08/2014 13:06 07/08/2014 13:06 07/08/2014 13:06  LDH Latest Range: 125-245 U/L   127  Iron Latest Range: 41-142 ug/dL 28 (L)    UIBC Latest Range: 120-384 ug/dL 306    TIBC Latest Range: 236-444 ug/dL 334    %SAT Latest Range: 21-57 % 8 (L)    Ferritin Latest Range: 9-269 ng/ml 17    RBC Folate Latest Range: >280 ng/mL   1096  Vitamin B-12 Latest Range: 211-911 pg/mL   515     SPEP with reflex to IFE  Status: Finalresult Visible to patient:  Not Released Nextappt: None Dx:  Anemia, unspecified anemia type            Ref Range 3wk ago    Total Protein, Serum Electrophoresis 6.0 - 8.3 g/dL 6.7   Albumin ELP 55.8 - 66.1 % 55.0 (L)   Alpha-1-Globulin 2.9 - 4.9 % 4.7   Alpha-2-Globulin 7.1 - 11.8 % 11.4   Beta Globulin 4.7 - 7.2 % 5.8   Beta 2 3.2 - 6.5 % 5.9   Gamma Globulin 11.1 - 18.8 % 17.2   M-Spike, % g/dL NOT DET   SPE Interp.  *   Comments: Normal pattern with slight decrease in albumin.Reviewed by Francis Gaines Mammarappallil MD (Electronic Signature on File)         RADIOGRAPHIC STUDIES: I have personally reviewed the radiological images as listed and agreed with the findings in the report. No  results found.  ASSESSMENT & PLAN:  49 year old female with past medical history of hypertension, diabetes, obesity, and chronic  anemia presents for evaluation of her anemia.  1. 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 -Iron study showed low serum iron, low saturation and low ferritin (17), consistent with iron deficient anemia. This is likely secondary to her heavy menstrual period. Her prior GI workup was negative. - I reviewed her other lab test results with her. She has normal E42, folic acid levels, SPEP negative, erythropoietin level elevated. -She has started Feraheme last week, we'll get second dose today. -I'll follow-up her in 2 months with repeated CBC and iron study. -She has been following with her GYN, hopefully the treatment she is getting well improve her menorrhagia.  2. Hypertension, diabetes, obesity She will continue follow-up with her primary care physician for this medical issues.  PLAN -IV Feraheme infusion today -follow-up in 2 months with CBC and iron study.   All questions were answered. The patient knows to call the clinic with any problems, questions or concerns. I spent 15 minutes counseling the patient face to face. The total time spent in the appointment was 20 minutes and more than 50% was on counseling.     Truitt Merle, MD 07/29/2014   3:52 PM

## 2014-08-05 ENCOUNTER — Ambulatory Visit: Payer: Federal, State, Local not specified - PPO

## 2014-09-19 ENCOUNTER — Other Ambulatory Visit (HOSPITAL_BASED_OUTPATIENT_CLINIC_OR_DEPARTMENT_OTHER): Payer: Federal, State, Local not specified - PPO

## 2014-09-19 DIAGNOSIS — D509 Iron deficiency anemia, unspecified: Secondary | ICD-10-CM

## 2014-09-19 LAB — CBC & DIFF AND RETIC
BASO%: 1.2 % (ref 0.0–2.0)
BASOS ABS: 0.1 10*3/uL (ref 0.0–0.1)
EOS ABS: 0.2 10*3/uL (ref 0.0–0.5)
EOS%: 3.7 % (ref 0.0–7.0)
HEMATOCRIT: 33.5 % — AB (ref 34.8–46.6)
HEMOGLOBIN: 10.9 g/dL — AB (ref 11.6–15.9)
Immature Retic Fract: 4.2 % (ref 1.60–10.00)
LYMPH#: 1.2 10*3/uL (ref 0.9–3.3)
LYMPH%: 24.9 % (ref 14.0–49.7)
MCH: 27.7 pg (ref 25.1–34.0)
MCHC: 32.5 g/dL (ref 31.5–36.0)
MCV: 85 fL (ref 79.5–101.0)
MONO#: 0.3 10*3/uL (ref 0.1–0.9)
MONO%: 6.5 % (ref 0.0–14.0)
NEUT%: 63.7 % (ref 38.4–76.8)
NEUTROS ABS: 3.1 10*3/uL (ref 1.5–6.5)
Platelets: 269 10*3/uL (ref 145–400)
RBC: 3.94 10*6/uL (ref 3.70–5.45)
RDW: 15.9 % — ABNORMAL HIGH (ref 11.2–14.5)
Retic %: 0.96 % (ref 0.70–2.10)
Retic Ct Abs: 37.82 10*3/uL (ref 33.70–90.70)
WBC: 4.9 10*3/uL (ref 3.9–10.3)

## 2014-09-19 LAB — IRON AND TIBC CHCC
%SAT: 12 % — ABNORMAL LOW (ref 21–57)
Iron: 30 ug/dL — ABNORMAL LOW (ref 41–142)
TIBC: 244 ug/dL (ref 236–444)
UIBC: 214 ug/dL (ref 120–384)

## 2014-09-19 LAB — FERRITIN CHCC: FERRITIN: 110 ng/mL (ref 9–269)

## 2014-09-20 ENCOUNTER — Other Ambulatory Visit: Payer: Federal, State, Local not specified - PPO

## 2014-09-23 ENCOUNTER — Ambulatory Visit: Payer: Federal, State, Local not specified - PPO | Admitting: Hematology

## 2014-10-01 ENCOUNTER — Other Ambulatory Visit: Payer: Self-pay | Admitting: Endocrinology

## 2014-10-01 DIAGNOSIS — E042 Nontoxic multinodular goiter: Secondary | ICD-10-CM

## 2014-10-02 ENCOUNTER — Ambulatory Visit (HOSPITAL_BASED_OUTPATIENT_CLINIC_OR_DEPARTMENT_OTHER): Payer: Federal, State, Local not specified - PPO | Admitting: Hematology

## 2014-10-02 ENCOUNTER — Telehealth: Payer: Self-pay | Admitting: Hematology

## 2014-10-02 ENCOUNTER — Encounter: Payer: Self-pay | Admitting: Hematology

## 2014-10-02 VITALS — BP 150/72 | HR 68 | Temp 98.5°F | Resp 18 | Ht 70.0 in | Wt 315.0 lb

## 2014-10-02 DIAGNOSIS — I10 Essential (primary) hypertension: Secondary | ICD-10-CM

## 2014-10-02 DIAGNOSIS — D509 Iron deficiency anemia, unspecified: Secondary | ICD-10-CM

## 2014-10-02 DIAGNOSIS — E119 Type 2 diabetes mellitus without complications: Secondary | ICD-10-CM

## 2014-10-02 DIAGNOSIS — N920 Excessive and frequent menstruation with regular cycle: Secondary | ICD-10-CM

## 2014-10-02 NOTE — Progress Notes (Signed)
[facility]  Telephone:(336) [phone] Fax:(336) [place] consult Note   Patient Care Team: Doree Fudge, MD as PCP - General (Family Medicine) 10/02/2014  CHIEF COMPLAINTS:  Follow up anemia of iron deficiency   HISTORY OF PRESENTING ILLNESS:  Kristina Adams 49 y.o. female is here because of her chronic anemia.   She was found to be anemic about 15 years ago, our electronic medical records shows she has anemia at least back to December 2011, with hemoglobin in the range of 8-10.8, MCV in the high 70s to low 80s range. Her white count and platelet count are usually normal, with a few occasions with slightly elevated every BC and platelet count.   She has period every 28 days, except last month she had twice. It lasts 7 days, very heavy in the first two days, she changes every 3 hours.  She denied any bleeding episodes including hematochezia, melana, hemoptysis, hematuria or epitaxis. No mucosal bleeding or easy bruising.  She used to take oral iron but could not tolerated due to the constipation. She had EGD and EUS twice in 2009, which showed submucosal lesion close to the GE junction measuring 2-3 cm, stable. Biopsy was negative twice. Per patient, she had colonoscopy about 45 years ago which was unremarkable. I do not have the colonoscopy report available.  She denies recent chest pain on exertion, shortness of breath on minimal exertion, pre-syncopal episodes, or palpitations. She had not noticed any recent bleeding such as epistaxis, hematuria or hematochezia The patient denies over the counter NSAID ingestion. She is not on antiplatelets agents. Her last colonoscopy none  She had no prior history or diagnosis of cancer. Her age appropriate screening programs are up-to-date. She denies any pica and eats a variety of diet. She never donated blood or received blood transfusion   She works for [employer] full time. She is pretty active. She feels well  overall at except  mild fatigue. She denies any pain, nausea, cough, change of her bowel habits, or any recent episodes of fever chill, no recent weight loss.  INTERIM HISTORY: She returns for follow up. She received Feraheme infusion twice 2 months ago and tolerated well. She did feel much better a few weeks later, with more energy. She otherwise denies any other new symptoms. She is on Lupron injection, her menstrual period was to have a, and last one was a 1 months ago.  MEDICAL HISTORY:  Past Medical History  Diagnosis Date  . Diabetes mellitus   . DDD (degenerative disc disease)   . Chronic back pain 12/03/2011  . Appendicitis, acute 12/03/2011  . Diabetes mellitus type 2, insulin dependent 12/03/2011  . Morbid obesity with BMI of 45.0-49.9, adult 12/03/2011  . Blood transfusion   . Thyroid disease   . Hypertension     sees Dr. Darron Doom, [facility], [place]  . Hypertension 12/03/2011  . Sleep apnea 12/03/2011    last sleep study May 2013  . Urinary tract bacterial infections     hx of  . GERD (gastroesophageal reflux disease)   . H/O hiatal hernia   . Headache(784.0)   . Anemia 12/09/2011    child birth  . Degenerative disc disease, cervical     low back area    SURGICAL HISTORY: Past Surgical History  Procedure Laterality Date  . Cesarean section    . Laparoscopic appendectomy  12/03/2011    Procedure: APPENDECTOMY LAPAROSCOPIC;  Surgeon: [name], DO;  Location: [facility];  Service: General;  Laterality: N/A;  drainage of intra-abdominal abscess   . Appendectomy    . Tubal ligation    . Vitrectomy    . Pars plana vitrectomy Left 08/17/2012    Procedure: PARS PLANA VITRECTOMY WITH 25 GAUGE;  Surgeon: [name], MD;  Location: [place];  Service: Ophthalmology;  Laterality: Left;  . Repair of complex traction retinal detachment Left 08/17/2012    Procedure: REPAIR OF COMPLEX TRACTION RETINAL DETACHMENT;  Surgeon: [name], MD;  Location: [facility];   Service: Ophthalmology;  Laterality: Left;    SOCIAL HISTORY: History   Social History  . Marital Status: Divorced    Spouse Name: N/A    Number of Children: 17 yo daughter and 31 yo  son  . Years of Education: N/A   Occupational History  . Not on file.   Social History Main Topics  . Smoking status: Never   . Smokeless tobacco: Never Used  . Alcohol Use: 0.6 oz/week    1 Glasses of wine per week  . Drug Use: No  . Sexual Activity: Yes    Birth Control/ Protection: Pill   Other Topics Concern  . Not on file   Social History Narrative  . No narrative on file    FAMILY HISTORY: Family History  Problem Relation Age of Onset  . Cancer Mother 49    breast cancer   . Diabetes Other   . Cancer Other   . Hypertension Other     ALLERGIES:  is allergic to latex and iohexol.  MEDICATIONS:  Current Outpatient Prescriptions  Medication Sig Dispense Refill  . diphenhydramine-acetaminophen (TYLENOL PM) 25-500 MG TABS Take 1 tablet by mouth at bedtime as needed.    . cholecalciferol (VITAMIN D) 1000 UNITS tablet Take 2,000 Units by mouth daily.    . cyclobenzaprine (FLEXERIL) 10 MG tablet Take 10 mg by mouth 3 (three) times daily as needed.    . glyBURIDE (DIABETA) 5 MG tablet Take 5 mg by mouth daily.    . hydrochlorothiazide (HYDRODIURIL) 25 MG tablet Take 25 mg by mouth daily.    . Insulin Glargine (LANTUS) 100 UNIT/ML Solostar Pen Inject 80 Units into the skin daily.    Marland Kitchen lisinopril (PRINIVIL,ZESTRIL) 40 MG tablet Take 40 mg by mouth daily.    . meloxicam (MOBIC) 15 MG tablet Take 15 mg by mouth daily.    . methimazole (TAPAZOLE) 5 MG tablet Take 10 mg by mouth daily.    . Multiple Vitamin (MULITIVITAMIN WITH MINERALS) TABS Take 1 tablet by mouth daily.    . vitamin C (ASCORBIC ACID) 500 MG tablet Take 1,000 mg by mouth daily.     No current facility-administered medications for this visit.    REVIEW OF SYSTEMS:   Constitutional: Denies fevers, chills or abnormal  night sweats Eyes: Denies blurriness of vision, double vision or watery eyes Ears, nose, mouth, throat, and face: Denies mucositis or sore throat Respiratory: Denies cough, dyspnea or wheezes Cardiovascular: Denies palpitation, chest discomfort or lower extremity swelling Gastrointestinal:  Denies nausea, heartburn or change in bowel habits Skin: Denies abnormal skin rashes Lymphatics: Denies new lymphadenopathy or easy bruising Neurological:Denies numbness, tingling or new weaknesses Behavioral/Psych: Mood is stable, no new changes  All other systems were reviewed with the patient and are negative.  PHYSICAL EXAMINATION: ECOG PERFORMANCE STATUS: 0 - Asymptomatic  Filed Vitals:   10/02/14 1258  BP: 150/72  Pulse: 68  Temp: 98.5 F (36.9  C)  Resp: 18   Filed Weights   10/02/14 1258  Weight: 315 lb (142.883 kg)    GENERAL:alert, no distress and comfortable SKIN: skin color, texture, turgor are normal, no rashes or significant lesions EYES: normal, conjunctiva are pink and non-injected, sclera clear OROPHARYNX:no exudate, no erythema and lips, buccal mucosa, and tongue normal  NECK: supple, thyroid normal size, non-tender, without nodularity LYMPH:  no palpable lymphadenopathy in the cervical, axillary or inguinal LUNGS: clear to auscultation and percussion with normal breathing effort HEART: regular rate & rhythm and no murmurs and no lower extremity edema ABDOMEN:abdomen soft, non-tender and normal bowel sounds Musculoskeletal:no cyanosis of digits and no clubbing  PSYCH: alert & oriented x 3 with fluent speech NEURO: no focal motor/sensory deficits  LABORATORY DATA:  I have reviewed the data as listed CBC Latest Ref Rng 09/19/2014 07/08/2014 08/17/2012  WBC 3.9 - 10.3 10e3/uL 4.9 5.0 7.8  Hemoglobin 11.6 - 15.9 g/dL 10.9(L) 9.7(L) 10.5(L)  Hematocrit 34.8 - 46.6 % 33.5(L) 31.7(L) 32.6(L)  Platelets 145 - 400 10e3/uL 269 309 389   Retic % 0.70 - 2.10 % 0.86      Retic  Ct Abs 33.70 - 90.70 10e3/uL 33.80       CMP Latest Ref Rng 07/08/2014 08/17/2012 12/09/2011  Glucose 70 - 140 mg/dl 239(H) 139(H) 218(H)  BUN 7.0 - 26.0 mg/dL 13.4 16 5(L)  Creatinine 0.6 - 1.1 mg/dL 0.8 0.64 0.65  Sodium 136 - 145 mEq/L 138 138 135  Potassium 3.5 - 5.1 mEq/L 4.6 3.7 4.7  Chloride 96 - 112 mEq/L - 101 99  CO2 22 - 29 mEq/L 28 28 27   Calcium 8.4 - 10.4 mg/dL 8.8 9.2 8.6  Total Protein 6.4 - 8.3 g/dL 6.8 - 6.1  Total Bilirubin 0.20 - 1.20 mg/dL 0.20 - 0.1(L)  Alkaline Phos 40 - 150 U/L 63 - 62  AST 5 - 34 U/L 10 - 38(H)  ALT 0 - 55 U/L 13 - 17   Ferritin  Status: Finalresult Visible to patient:  Not Released Nextappt: 10/08/2014 at 02:00 PM in Radiology ([facility]) Dx:  Iron deficiency anemia           Ref Range 13d ago  76mo ago     Ferritin 9 - 269 ng/ml 110 17   Resulting Agency  [facility]         Iron and TIBC [facility]  Status: Finalresult Visible to patient:  Not Released Nextappt: 10/08/2014 at 02:00 PM in Radiology ([facility]) Dx:  Iron deficiency anemia              Ref Range 13d ago  81mo ago     Iron 41 - 142 ug/dL 30 (L) 28 (L)    TIBC 236 - 444 ug/dL 244 334    UIBC 120 - 384 ug/dL 214 306    %SAT 21 - 57 % 12 (L) 8 (L)           RADIOGRAPHIC STUDIES: I have personally reviewed the radiological images as listed and agreed with the findings in the report. No results found.  ASSESSMENT & PLAN:  49 year old female with past medical history of hypertension, diabetes, obesity, and chronic anemia presents for evaluation of her anemia.  1. 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 -Iron study showed low serum iron, low saturation and low ferritin (17),  consistent with iron deficient anemia. This is likely secondary to her heavy menstrual period. Her prior GI  workup was negative. - I reviewed her other lab test results with her. She has normal Y56, folic acid levels, SPEP negative, erythropoietin level elevated. -She responded to Feraheme, hemoglobin improved from 9.7 to10.9.  Her ferritin and serum iron level has improved, but her saturation is still low. -I'll give her her Feraheme infusion 2 more times in the next months -Return to clinic with repeated iron studies and CBC in 3 months.  2. Hypertension, diabetes, obesity She will continue follow-up with her primary care physician for this medical issues.  PLAN -IV Feraheme infusion  510 mg twice within the next months -follow-up in 3 months with CBC and iron study.   All questions were answered. The patient knows to call the clinic with any problems, questions or concerns. I spent 15 minutes counseling the patient face to face. The total time spent in the appointment was 20 minutes and more than 50% was on counseling.     Truitt Merle, MD 10/02/2014   1:06 PM

## 2014-10-02 NOTE — Telephone Encounter (Signed)
Gave avs & calendar for June sent message to schedule treatment.

## 2014-10-07 ENCOUNTER — Ambulatory Visit (HOSPITAL_BASED_OUTPATIENT_CLINIC_OR_DEPARTMENT_OTHER): Payer: Federal, State, Local not specified - PPO

## 2014-10-07 DIAGNOSIS — D509 Iron deficiency anemia, unspecified: Secondary | ICD-10-CM | POA: Diagnosis not present

## 2014-10-07 MED ORDER — SODIUM CHLORIDE 0.9 % IV SOLN
510.0000 mg | Freq: Once | INTRAVENOUS | Status: AC
  Administered 2014-10-07: 510 mg via INTRAVENOUS
  Filled 2014-10-07: qty 17

## 2014-10-07 MED ORDER — SODIUM CHLORIDE 0.9 % IV SOLN
Freq: Once | INTRAVENOUS | Status: AC
  Administered 2014-10-07: 11:00:00 via INTRAVENOUS

## 2014-10-07 NOTE — Patient Instructions (Signed)

## 2014-10-08 ENCOUNTER — Inpatient Hospital Stay: Admission: RE | Admit: 2014-10-08 | Payer: Federal, State, Local not specified - PPO | Source: Ambulatory Visit

## 2014-10-14 ENCOUNTER — Ambulatory Visit (HOSPITAL_BASED_OUTPATIENT_CLINIC_OR_DEPARTMENT_OTHER): Payer: Federal, State, Local not specified - PPO

## 2014-10-14 DIAGNOSIS — D509 Iron deficiency anemia, unspecified: Secondary | ICD-10-CM

## 2014-10-14 MED ORDER — SODIUM CHLORIDE 0.9 % IV SOLN
510.0000 mg | Freq: Once | INTRAVENOUS | Status: AC
  Administered 2014-10-14: 510 mg via INTRAVENOUS
  Filled 2014-10-14: qty 17

## 2014-10-14 MED ORDER — SODIUM CHLORIDE 0.9 % IV SOLN
Freq: Once | INTRAVENOUS | Status: AC
  Administered 2014-10-14: 09:00:00 via INTRAVENOUS

## 2014-10-14 NOTE — Patient Instructions (Signed)

## 2014-10-17 ENCOUNTER — Ambulatory Visit
Admission: RE | Admit: 2014-10-17 | Discharge: 2014-10-17 | Disposition: A | Discharge status: Home / Self Care | Payer: Federal, State, Local not specified - PPO | Source: Ambulatory Visit | Attending: Endocrinology | Admitting: Endocrinology

## 2014-10-17 DIAGNOSIS — E042 Nontoxic multinodular goiter: Secondary | ICD-10-CM

## 2014-10-18 ENCOUNTER — Other Ambulatory Visit: Payer: Self-pay | Admitting: Specialist

## 2014-10-18 DIAGNOSIS — R1011 Right upper quadrant pain: Secondary | ICD-10-CM

## 2014-10-25 ENCOUNTER — Ambulatory Visit
Admission: RE | Admit: 2014-10-25 | Discharge: 2014-10-25 | Disposition: A | Discharge status: Home / Self Care | Payer: Federal, State, Local not specified - PPO | Source: Ambulatory Visit | Attending: Specialist | Admitting: Specialist

## 2014-10-25 DIAGNOSIS — R1011 Right upper quadrant pain: Secondary | ICD-10-CM

## 2014-11-01 ENCOUNTER — Other Ambulatory Visit: Payer: Self-pay | Admitting: Obstetrics and Gynecology

## 2015-01-01 ENCOUNTER — Other Ambulatory Visit (HOSPITAL_BASED_OUTPATIENT_CLINIC_OR_DEPARTMENT_OTHER): Payer: Federal, State, Local not specified - PPO

## 2015-01-01 DIAGNOSIS — E119 Type 2 diabetes mellitus without complications: Secondary | ICD-10-CM

## 2015-01-01 DIAGNOSIS — D509 Iron deficiency anemia, unspecified: Secondary | ICD-10-CM | POA: Diagnosis not present

## 2015-01-01 LAB — CBC & DIFF AND RETIC
BASO%: 0.2 % (ref 0.0–2.0)
Basophils Absolute: 0 10*3/uL (ref 0.0–0.1)
EOS%: 1.2 % (ref 0.0–7.0)
Eosinophils Absolute: 0.1 10*3/uL (ref 0.0–0.5)
HEMATOCRIT: 37.6 % (ref 34.8–46.6)
HGB: 12.4 g/dL (ref 11.6–15.9)
Immature Retic Fract: 11.1 % — ABNORMAL HIGH (ref 1.60–10.00)
LYMPH%: 14.9 % (ref 14.0–49.7)
MCH: 29.3 pg (ref 25.1–34.0)
MCHC: 33 g/dL (ref 31.5–36.0)
MCV: 88.9 fL (ref 79.5–101.0)
MONO#: 0.4 10*3/uL (ref 0.1–0.9)
MONO%: 4.5 % (ref 0.0–14.0)
NEUT#: 7.2 10*3/uL — ABNORMAL HIGH (ref 1.5–6.5)
NEUT%: 79.2 % — ABNORMAL HIGH (ref 38.4–76.8)
Platelets: 282 10*3/uL (ref 145–400)
RBC: 4.23 10*6/uL (ref 3.70–5.45)
RDW: 13.9 % (ref 11.2–14.5)
RETIC CT ABS: 85.02 10*3/uL (ref 33.70–90.70)
Retic %: 2.01 % (ref 0.70–2.10)
WBC: 9.1 10*3/uL (ref 3.9–10.3)
lymph#: 1.4 10*3/uL (ref 0.9–3.3)

## 2015-01-01 LAB — FERRITIN CHCC: FERRITIN: 218 ng/mL (ref 9–269)

## 2015-01-01 LAB — IRON AND TIBC CHCC
%SAT: 12 % — AB (ref 21–57)
IRON: 32 ug/dL — AB (ref 41–142)
TIBC: 259 ug/dL (ref 236–444)
UIBC: 227 ug/dL (ref 120–384)

## 2015-01-08 ENCOUNTER — Encounter: Payer: Self-pay | Admitting: Hematology

## 2015-01-08 ENCOUNTER — Ambulatory Visit (HOSPITAL_BASED_OUTPATIENT_CLINIC_OR_DEPARTMENT_OTHER): Payer: Federal, State, Local not specified - PPO | Admitting: Hematology

## 2015-01-08 ENCOUNTER — Telehealth: Payer: Self-pay | Admitting: Hematology

## 2015-01-08 VITALS — BP 125/65 | HR 69 | Temp 98.3°F | Resp 18 | Ht 70.0 in | Wt 317.8 lb

## 2015-01-08 DIAGNOSIS — I10 Essential (primary) hypertension: Secondary | ICD-10-CM

## 2015-01-08 DIAGNOSIS — N920 Excessive and frequent menstruation with regular cycle: Secondary | ICD-10-CM

## 2015-01-08 DIAGNOSIS — E119 Type 2 diabetes mellitus without complications: Secondary | ICD-10-CM

## 2015-01-08 DIAGNOSIS — D509 Iron deficiency anemia, unspecified: Secondary | ICD-10-CM

## 2015-01-08 DIAGNOSIS — D500 Iron deficiency anemia secondary to blood loss (chronic): Secondary | ICD-10-CM

## 2015-01-08 NOTE — Telephone Encounter (Signed)
Gave and printed appt sched and avs fo rpt JUlY thru Sept

## 2015-01-08 NOTE — Progress Notes (Signed)
[name]  Telephone:([phone]) [phone] Fax:([phone]) [place] consult Note   Patient Care Team: Doree Fudge, MD as PCP - General (Family Medicine) 01/08/2015  CHIEF COMPLAINTS:  Follow up anemia of iron deficiency   HISTORY OF PRESENTING ILLNESS:  Kristina Adams 49 y.o. female is here because of her chronic anemia.   She was found to be anemic about 15 years ago, our electronic medical records shows she has anemia at least back to December 2011, with hemoglobin in the range of 8-10.8, MCV in the high 70s to low 80s range. Her white count and platelet count are usually normal, with a few occasions with slightly elevated every BC and platelet count.   She has period every 28 days, except last month she had twice. It lasts 7 days, very heavy in the first two days, she changes every 3 hours.  She denied any bleeding episodes including hematochezia, melana, hemoptysis, hematuria or epitaxis. No mucosal bleeding or easy bruising.  She used to take oral iron but could not tolerated due to the constipation. She had EGD and EUS twice in 2009, which showed submucosal lesion close to the GE junction measuring 2-3 cm, stable. Biopsy was negative twice. Per patient, she had colonoscopy about 45 years ago which was unremarkable. I do not have the colonoscopy report available.  She denies recent chest pain on exertion, shortness of breath on minimal exertion, pre-syncopal episodes, or palpitations. She had not noticed any recent bleeding such as epistaxis, hematuria or hematochezia The patient denies over the counter NSAID ingestion. She is not on antiplatelets agents. Her last colonoscopy none  She had no prior history or diagnosis of cancer. Her age appropriate screening programs are up-to-date. She denies any pica and eats a variety of diet. She never donated blood or received blood transfusion   She works for [employer] full time. She is pretty active. She feels well  overall at except  mild fatigue. She denies any pain, nausea, cough, change of her bowel habits, or any recent episodes of fever chill, no recent weight loss.  INTERIM HISTORY: She returns for follow up. She has been having menstral period again every month since 3 months ago, heavy, lasts about 7 days. She has stopped lupron, and underwent endometerial biopsy on 11/01/2014 which was negative. No other bleeding. She was quite fatigued in the past months, slightly better this week. No chest pain or dyspnea or any other new symptoms.   MEDICAL HISTORY:  Past Medical History  Diagnosis Date  . Diabetes mellitus   . DDD (degenerative disc disease)   . Chronic back pain 12/03/2011  . Appendicitis, acute 12/03/2011  . Diabetes mellitus type 2, insulin dependent 12/03/2011  . Morbid obesity with BMI of 45.0-49.9, adult 12/03/2011  . Blood transfusion   . Thyroid disease   . Hypertension     sees Dr. Darron Doom, [facility], [place]  . Hypertension 12/03/2011  . Sleep apnea 12/03/2011    last sleep study May 2013  . Urinary tract bacterial infections     hx of  . GERD (gastroesophageal reflux disease)   . H/O hiatal hernia   . Headache(784.0)   . Anemia 12/09/2011    child birth  . Degenerative disc disease, cervical     low back area    SURGICAL HISTORY: Past Surgical History  Procedure Laterality Date  . Cesarean section    . Laparoscopic appendectomy  12/03/2011    Procedure: APPENDECTOMY  LAPAROSCOPIC;  Surgeon: [name], DO;  Location: [facility];  Service: General;  Laterality: N/A;  drainage of intra-abdominal abscess   . Appendectomy    . Tubal ligation    . Vitrectomy    . Pars plana vitrectomy Left 08/17/2012    Procedure: PARS PLANA VITRECTOMY WITH 25 GAUGE;  Surgeon: [name], MD;  Location: [place];  Service: Ophthalmology;  Laterality: Left;  . 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 Marital Status: Divorced    Spouse Name: N/A    Number of Children: 4 yo daughter and 22 yo  son  . Years of Education: N/A   Occupational History  . Not on file.   Social History Main Topics  . Smoking status: Never   . Smokeless tobacco: Never Used  . Alcohol Use: 0.6 oz/week    1 Glasses of wine per week  . Drug Use: No  . Sexual Activity: Yes    Birth Control/ Protection: Pill   Other Topics Concern  . Not on file   Social History Narrative  . No narrative on file    FAMILY HISTORY: Family History  Problem Relation Age of Onset  . Cancer Mother 27    breast cancer   . Diabetes Other   . Cancer Other   . Hypertension Other     ALLERGIES:  is allergic to latex and iohexol.  MEDICATIONS:  Current Outpatient Prescriptions  Medication Sig Dispense Refill  . cholecalciferol (VITAMIN D) 1000 UNITS tablet Take 2,000 Units by mouth daily.    . cyclobenzaprine (FLEXERIL) 10 MG tablet Take 10 mg by mouth 3 (three) times daily as needed.    . diphenhydramine-acetaminophen (TYLENOL PM) 25-500 MG TABS Take 1 tablet by mouth at bedtime as needed.    . glyBURIDE (DIABETA) 5 MG tablet Take 5 mg by mouth daily.    . hydrochlorothiazide (HYDRODIURIL) 25 MG tablet Take 25 mg by mouth daily.    Marland Kitchen JANUVIA 100 MG tablet Take 100 mg by mouth daily.    Marland Kitchen lisinopril (PRINIVIL,ZESTRIL) 40 MG tablet Take 40 mg by mouth daily.    . meloxicam (MOBIC) 15 MG tablet Take 15 mg by mouth daily.    . methimazole (TAPAZOLE) 5 MG tablet Take 10 mg by mouth daily.    . Multiple Vitamin (MULITIVITAMIN WITH MINERALS) TABS Take 1 tablet by mouth daily.    . vitamin C (ASCORBIC ACID) 500 MG tablet Take 1,000 mg by mouth daily.    . Insulin Glargine (LANTUS) 100 UNIT/ML Solostar Pen Inject 80 Units into the skin daily.     No current  facility-administered medications for this visit.    REVIEW OF SYSTEMS:   Constitutional: Denies fevers, chills or abnormal night sweats Eyes: Denies blurriness of vision, double vision or watery eyes Ears, nose, mouth, throat, and face: Denies mucositis or sore throat Respiratory: Denies cough, dyspnea or wheezes Cardiovascular: Denies palpitation, chest discomfort or lower extremity swelling Gastrointestinal:  Denies nausea, heartburn or change in bowel habits Skin: Denies abnormal skin rashes Lymphatics: Denies new lymphadenopathy or easy bruising Neurological:Denies numbness, tingling or new weaknesses Behavioral/Psych: Mood is stable, no new changes  All other systems were reviewed with the patient and are negative.  PHYSICAL EXAMINATION: ECOG PERFORMANCE STATUS:  0 - Asymptomatic  Filed Vitals:   01/08/15 1303  BP: 125/65  Pulse: 69  Temp: 98.3 F (36.8 C)  Resp: 18   Filed Weights   01/08/15 1303  Weight: 317 lb 12.8 oz (144.153 kg)    GENERAL:alert, no distress and comfortable SKIN: skin color, texture, turgor are normal, no rashes or significant lesions EYES: normal, conjunctiva are pink and non-injected, sclera clear OROPHARYNX:no exudate, no erythema and lips, buccal mucosa, and tongue normal  NECK: supple, thyroid normal size, non-tender, without nodularity LYMPH:  no palpable lymphadenopathy in the cervical, axillary or inguinal LUNGS: clear to auscultation and percussion with normal breathing effort HEART: regular rate & rhythm and no murmurs and no lower extremity edema ABDOMEN:abdomen soft, non-tender and normal bowel sounds Musculoskeletal:no cyanosis of digits and no clubbing  PSYCH: alert & oriented x 3 with fluent speech NEURO: no focal motor/sensory deficits  LABORATORY DATA:  I have reviewed the data as listed CBC Latest Ref Rng 01/01/2015 09/19/2014 07/08/2014  WBC 3.9 - 10.3 10e3/uL 9.1 4.9 5.0  Hemoglobin 11.6 - 15.9 g/dL 12.4 10.9(L) 9.7(L)    Hematocrit 34.8 - 46.6 % 37.6 33.5(L) 31.7(L)  Platelets 145 - 400 10e3/uL 282 269 309   Retic % 0.70 - 2.10 % 0.86      Retic Ct Abs 33.70 - 90.70 10e3/uL 33.80       CMP Latest Ref Rng 07/08/2014 08/17/2012 12/09/2011  Glucose 70 - 140 mg/dl 239(H) 139(H) 218(H)  BUN 7.0 - 26.0 mg/dL 13.4 16 5(L)  Creatinine 0.6 - 1.1 mg/dL 0.8 0.64 0.65  Sodium 136 - 145 mEq/L 138 138 135  Potassium 3.5 - 5.1 mEq/L 4.6 3.7 4.7  Chloride 96 - 112 mEq/L - 101 99  CO2 22 - 29 mEq/L 28 28 27   Calcium 8.4 - 10.4 mg/dL 8.8 9.2 8.6  Total Protein 6.4 - 8.3 g/dL 6.8 - 6.1  Total Bilirubin 0.20 - 1.20 mg/dL 0.20 - 0.1(L)  Alkaline Phos 40 - 150 U/L 63 - 62  AST 5 - 34 U/L 10 - 38(H)  ALT 0 - 55 U/L 13 - 17   Results for GRAYCEE, GREESON (MRN [id number]) as of 01/09/2015 12:11  Ref. Range 09/19/2014 12:23 01/01/2015 10:44  Iron Latest Ref Range: 41-142 ug/dL 30 (L) 32 (L)  UIBC Latest Ref Range: 120-384 ug/dL 214 227  TIBC Latest Ref Range: 236-444 ug/dL 244 259  %SAT Latest Ref Range: 21-57 % 12 (L) 12 (L)  Ferritin Latest Ref Range: 9-269 ng/ml 110 218     RADIOGRAPHIC STUDIES: I have personally reviewed the radiological images as listed and agreed with the findings in the report. No results found.  ASSESSMENT & PLAN:  49 year old female with past medical history of hypertension, diabetes, obesity, and chronic anemia presents for evaluation of her anemia.  1. 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 -Iron study showed low serum iron, low saturation and low ferritin (17), consistent with iron deficient anemia. This is likely secondary to her heavy menstrual period. Her prior GI workup was negative. - I reviewed her other lab test results with her. She has normal O13, folic acid levels, SPEP negative, erythropoietin level elevated. -She responded to Feraheme, hemoglobin  improved from 9.7 to12.4 now.  Her ferritin and serum iron level has improved, ferritin 218 last week. - we'll continue monitor her CBC and ferritin level, and a repeat  Feraheme if ferritin less than 100   2. Hypertension, diabetes, obesity She will continue follow-up with her primary care physician for this medical issues.  PLAN -Repeat CBC and iron study monthly, consider IV Feraheme if ferritin less than 100. She'll call me after her lab work -I'll see her back in 3 months with repeated lab.  All questions were answered. The patient knows to call the clinic with any problems, questions or concerns. I spent 15 minutes counseling the patient face to face. The total time spent in the appointment was 20 minutes and more than 50% was on counseling.     Truitt Merle, MD 01/08/2015   1:20 PM

## 2015-01-09 ENCOUNTER — Encounter: Payer: Self-pay | Admitting: Hematology

## 2015-02-05 ENCOUNTER — Other Ambulatory Visit: Payer: Federal, State, Local not specified - PPO

## 2015-03-05 ENCOUNTER — Other Ambulatory Visit (HOSPITAL_BASED_OUTPATIENT_CLINIC_OR_DEPARTMENT_OTHER): Payer: Federal, State, Local not specified - PPO

## 2015-03-05 DIAGNOSIS — E119 Type 2 diabetes mellitus without complications: Secondary | ICD-10-CM

## 2015-03-05 DIAGNOSIS — D509 Iron deficiency anemia, unspecified: Secondary | ICD-10-CM | POA: Diagnosis not present

## 2015-03-05 LAB — IRON AND TIBC CHCC
%SAT: 17 % — AB (ref 21–57)
Iron: 38 ug/dL — ABNORMAL LOW (ref 41–142)
TIBC: 228 ug/dL — ABNORMAL LOW (ref 236–444)
UIBC: 189 ug/dL (ref 120–384)

## 2015-03-05 LAB — CBC & DIFF AND RETIC
BASO%: 1.1 % (ref 0.0–2.0)
Basophils Absolute: 0.1 10*3/uL (ref 0.0–0.1)
EOS ABS: 0.2 10*3/uL (ref 0.0–0.5)
EOS%: 3.2 % (ref 0.0–7.0)
HCT: 34 % — ABNORMAL LOW (ref 34.8–46.6)
HEMOGLOBIN: 11.2 g/dL — AB (ref 11.6–15.9)
IMMATURE RETIC FRACT: 5.8 % (ref 1.60–10.00)
LYMPH%: 22.8 % (ref 14.0–49.7)
MCH: 29.2 pg (ref 25.1–34.0)
MCHC: 32.9 g/dL (ref 31.5–36.0)
MCV: 88.5 fL (ref 79.5–101.0)
MONO#: 0.4 10*3/uL (ref 0.1–0.9)
MONO%: 6.6 % (ref 0.0–14.0)
NEUT#: 4.2 10*3/uL (ref 1.5–6.5)
NEUT%: 66.3 % (ref 38.4–76.8)
Platelets: 287 10*3/uL (ref 145–400)
RBC: 3.84 10*6/uL (ref 3.70–5.45)
RDW: 12.8 % (ref 11.2–14.5)
Retic %: 1.71 % (ref 0.70–2.10)
Retic Ct Abs: 65.66 10*3/uL (ref 33.70–90.70)
WBC: 6.3 10*3/uL (ref 3.9–10.3)
lymph#: 1.4 10*3/uL (ref 0.9–3.3)

## 2015-03-05 LAB — FERRITIN CHCC: FERRITIN: 196 ng/mL (ref 9–269)

## 2015-03-26 ENCOUNTER — Encounter: Payer: Self-pay | Admitting: Hematology

## 2015-03-26 ENCOUNTER — Other Ambulatory Visit: Payer: Self-pay | Admitting: *Deleted

## 2015-03-26 ENCOUNTER — Encounter: Payer: Federal, State, Local not specified - PPO | Admitting: Hematology

## 2015-03-26 ENCOUNTER — Other Ambulatory Visit: Payer: Federal, State, Local not specified - PPO

## 2015-03-26 NOTE — Progress Notes (Signed)
No show  This encounter was created in error - please disregard.

## 2015-03-27 ENCOUNTER — Telehealth: Payer: Self-pay | Admitting: Hematology

## 2015-03-27 NOTE — Telephone Encounter (Signed)
pt cld left voicemail stated that she wanted to r/s appt-cld pt no answer left message to call back to r/s

## 2015-03-28 ENCOUNTER — Telehealth: Payer: Self-pay | Admitting: Hematology

## 2015-03-28 NOTE — Telephone Encounter (Signed)
lvm for pt regarding to calling back to r/s missed appt

## 2015-03-31 ENCOUNTER — Telehealth: Payer: Self-pay | Admitting: Hematology

## 2015-03-31 NOTE — Telephone Encounter (Signed)
pt cld to r/s appt-gave pt r/s time & date °

## 2015-04-14 ENCOUNTER — Telehealth: Payer: Self-pay | Admitting: *Deleted

## 2015-04-14 ENCOUNTER — Ambulatory Visit (HOSPITAL_BASED_OUTPATIENT_CLINIC_OR_DEPARTMENT_OTHER): Payer: Federal, State, Local not specified - PPO | Admitting: Hematology

## 2015-04-14 ENCOUNTER — Other Ambulatory Visit (HOSPITAL_BASED_OUTPATIENT_CLINIC_OR_DEPARTMENT_OTHER): Payer: Federal, State, Local not specified - PPO

## 2015-04-14 ENCOUNTER — Telehealth: Payer: Self-pay | Admitting: Hematology

## 2015-04-14 ENCOUNTER — Encounter: Payer: Self-pay | Admitting: Hematology

## 2015-04-14 VITALS — BP 171/70 | HR 74 | Temp 98.5°F | Resp 18 | Ht 70.0 in | Wt 315.0 lb

## 2015-04-14 DIAGNOSIS — R5383 Other fatigue: Secondary | ICD-10-CM | POA: Diagnosis not present

## 2015-04-14 DIAGNOSIS — E119 Type 2 diabetes mellitus without complications: Secondary | ICD-10-CM

## 2015-04-14 DIAGNOSIS — D509 Iron deficiency anemia, unspecified: Secondary | ICD-10-CM

## 2015-04-14 DIAGNOSIS — N920 Excessive and frequent menstruation with regular cycle: Secondary | ICD-10-CM | POA: Diagnosis not present

## 2015-04-14 DIAGNOSIS — D508 Other iron deficiency anemias: Secondary | ICD-10-CM

## 2015-04-14 DIAGNOSIS — E669 Obesity, unspecified: Secondary | ICD-10-CM

## 2015-04-14 DIAGNOSIS — I10 Essential (primary) hypertension: Secondary | ICD-10-CM

## 2015-04-14 LAB — CBC & DIFF AND RETIC
BASO%: 0.8 % (ref 0.0–2.0)
Basophils Absolute: 0.1 10*3/uL (ref 0.0–0.1)
EOS%: 2.3 % (ref 0.0–7.0)
Eosinophils Absolute: 0.1 10*3/uL (ref 0.0–0.5)
HCT: 37.8 % (ref 34.8–46.6)
HGB: 12.3 g/dL (ref 11.6–15.9)
Immature Retic Fract: 5.4 % (ref 1.60–10.00)
LYMPH%: 22.7 % (ref 14.0–49.7)
MCH: 28.5 pg (ref 25.1–34.0)
MCHC: 32.5 g/dL (ref 31.5–36.0)
MCV: 87.7 fL (ref 79.5–101.0)
MONO#: 0.3 10*3/uL (ref 0.1–0.9)
MONO%: 5.4 % (ref 0.0–14.0)
NEUT%: 68.8 % (ref 38.4–76.8)
NEUTROS ABS: 4.2 10*3/uL (ref 1.5–6.5)
Platelets: 289 10*3/uL (ref 145–400)
RBC: 4.31 10*6/uL (ref 3.70–5.45)
RDW: 12.6 % (ref 11.2–14.5)
RETIC %: 1.39 % (ref 0.70–2.10)
Retic Ct Abs: 59.91 10*3/uL (ref 33.70–90.70)
WBC: 6.1 10*3/uL (ref 3.9–10.3)
lymph#: 1.4 10*3/uL (ref 0.9–3.3)

## 2015-04-14 LAB — IRON AND TIBC CHCC
%SAT: 18 % — AB (ref 21–57)
IRON: 46 ug/dL (ref 41–142)
TIBC: 255 ug/dL (ref 236–444)
UIBC: 210 ug/dL (ref 120–384)

## 2015-04-14 LAB — FERRITIN CHCC: FERRITIN: 192 ng/mL (ref 9–269)

## 2015-04-14 NOTE — Telephone Encounter (Signed)
Pt confirmed labs/ov per 10/03 POF, gave pt AVS and Calendar.... KJ, sent msg to add [facility]

## 2015-04-14 NOTE — Progress Notes (Signed)
[place]  Telephone:(336) [phone] Fax:(336) [phone]  Clinic follow up Note   Patient Care Team: Doree Fudge, MD as PCP - General (Family Medicine) 04/14/2015  CHIEF COMPLAINTS:  Follow up anemia of iron deficiency   HISTORY OF PRESENTING ILLNESS:  Kristina Adams 49 y.o. female is here because of her chronic anemia.   She was found to be anemic about 15 years ago, our electronic medical records shows she has anemia at least back to December 2011, with hemoglobin in the range of 8-10.8, MCV in the high 70s to low 80s range. Her white count and platelet count are usually normal, with a few occasions with slightly elevated every BC and platelet count.   She has period every 28 days, except last month she had twice. It lasts 7 days, very heavy in the first two days, she changes every 3 hours.  She denied any bleeding episodes including hematochezia, melana, hemoptysis, hematuria or epitaxis. No mucosal bleeding or easy bruising.  She used to take oral iron but could not tolerated due to the constipation. She had EGD and EUS twice in 2009, which showed submucosal lesion close to the GE junction measuring 2-3 cm, stable. Biopsy was negative twice. Per patient, she had colonoscopy about 45 years ago which was unremarkable. I do not have the colonoscopy report available.  She denies recent chest pain on exertion, shortness of breath on minimal exertion, pre-syncopal episodes, or palpitations. She had not noticed any recent bleeding such as epistaxis, hematuria or hematochezia The patient denies over the counter NSAID ingestion. She is not on antiplatelets agents. Her last colonoscopy none  She had no prior history or diagnosis of cancer. Her age appropriate screening programs are up-to-date. She denies any pica and eats a variety of diet. She never donated blood or received blood transfusion   She works for [employer] full time. She is pretty active. She feels well overall  at except  mild fatigue. She denies any pain, nausea, cough, change of her bowel habits, or any recent episodes of fever chill, no recent weight loss.   TREATMENT: iv Feraheme as needed, received in 07/2014 and 09/2014   INTERIM HISTORY: She returns for follow up. Her period is lighter than before, it last about 5-7 days, and she did not have period in Aug 2016. she thinks she is going through menopause, she has hot flushes, mood swing, insomnia and fatigue. Her blood pressure and to sugar has been high also lately. She denies any other signs of significant bleeding.    MEDICAL HISTORY:  Past Medical History  Diagnosis Date  . Diabetes mellitus   . DDD (degenerative disc disease)   . Chronic back pain 12/03/2011  . Appendicitis, acute 12/03/2011  . Diabetes mellitus type 2, insulin dependent ([place]) 12/03/2011  . Morbid obesity with BMI of 45.0-49.9, adult ([employer]) 12/03/2011  . Blood transfusion   . Thyroid disease   . Hypertension     sees Dr. Darron Doom, [facility], [place]  . Hypertension 12/03/2011  . Sleep apnea 12/03/2011    last sleep study May 2013  . Urinary tract bacterial infections     hx of  . GERD (gastroesophageal reflux disease)   . H/O hiatal hernia   . Headache(784.0)   . Anemia 12/09/2011    child birth  . Degenerative disc disease, cervical     low back area    SURGICAL HISTORY: Past Surgical History  Procedure Laterality Date  .  Cesarean section    . Laparoscopic appendectomy  12/03/2011    Procedure: APPENDECTOMY LAPAROSCOPIC;  Surgeon: [name], DO;  Location: [facility];  Service: General;  Laterality: N/A;  drainage of intra-abdominal abscess   . Appendectomy    . Tubal ligation    . Vitrectomy    . Pars plana vitrectomy Left 08/17/2012    Procedure: PARS PLANA VITRECTOMY WITH 25 GAUGE;  Surgeon: [name], MD;  Location: [place];  Service: Ophthalmology;  Laterality: Left;  . Repair of complex traction retinal detachment Left 08/17/2012     Procedure: REPAIR OF COMPLEX TRACTION RETINAL DETACHMENT;  Surgeon: [name], MD;  Location: [place];  Service: Ophthalmology;  Laterality: Left;    SOCIAL HISTORY: History   Social History  . Marital Status: Divorced    Spouse Name: N/A    Number of Children: 62 yo daughter and 30 yo  son  . Years of Education: N/A   Occupational History  . Not on file.   Social History Main Topics  . Smoking status: Never   . Smokeless tobacco: Never Used  . Alcohol Use: 0.6 oz/week    1 Glasses of wine per week  . Drug Use: No  . Sexual Activity: Yes    Birth Control/ Protection: Pill   Other Topics Concern  . Not on file   Social History Narrative  . No narrative on file    FAMILY HISTORY: Family History  Problem Relation Age of Onset  . Cancer Mother 72    breast cancer   . Diabetes Other   . Cancer Other   . Hypertension Other     ALLERGIES:  is allergic to latex and iohexol.  MEDICATIONS:  Current Outpatient Prescriptions  Medication Sig Dispense Refill  . cholecalciferol (VITAMIN D) 1000 UNITS tablet Take 2,000 Units by mouth daily.    . cyclobenzaprine (FLEXERIL) 10 MG tablet Take 10 mg by mouth 3 (three) times daily as needed.    . diphenhydramine-acetaminophen (TYLENOL PM) 25-500 MG TABS Take 1 tablet by mouth at bedtime as needed.    . glyBURIDE (DIABETA) 5 MG tablet Take 5 mg by mouth daily.    . hydrochlorothiazide (HYDRODIURIL) 25 MG tablet Take 25 mg by mouth daily.    . Insulin Glargine (LANTUS) 100 UNIT/ML Solostar Pen Inject 80 Units into the skin daily.    Marland Kitchen JANUVIA 100 MG tablet Take 100 mg by mouth daily.    Marland Kitchen lisinopril (PRINIVIL,ZESTRIL) 40 MG tablet Take 40 mg by mouth daily.    . meloxicam (MOBIC) 15 MG tablet Take 15 mg by mouth daily.    . methimazole (TAPAZOLE) 5 MG tablet Take 10 mg by mouth daily.    . Multiple Vitamin (MULITIVITAMIN WITH MINERALS) TABS Take 1 tablet by mouth daily.    . vitamin C (ASCORBIC ACID) 500 MG tablet Take 1,000 mg  by mouth daily.     No current facility-administered medications for this visit.    REVIEW OF SYSTEMS:   Constitutional: Denies fevers, chills or abnormal night sweats Eyes: Denies blurriness of vision, double vision or watery eyes Ears, nose, mouth, throat, and face: Denies mucositis or sore throat Respiratory: Denies cough, dyspnea or wheezes Cardiovascular: Denies palpitation, chest discomfort or lower extremity swelling Gastrointestinal:  Denies nausea, heartburn or change in bowel habits Skin: Denies abnormal skin rashes Lymphatics: Denies new lymphadenopathy or easy bruising Neurological:Denies numbness, tingling or new weaknesses Behavioral/Psych: Mood is stable, no new changes  All other  systems were reviewed with the patient and are negative.  PHYSICAL EXAMINATION: ECOG PERFORMANCE STATUS: 0 - Asymptomatic  Filed Vitals:   04/14/15 1121  BP: 171/70  Pulse: 74  Temp: 98.5 F (36.9 C)  Resp: 18   Filed Weights   04/14/15 1121  Weight: 315 lb (142.883 kg)    GENERAL:alert, no distress and comfortable SKIN: skin color, texture, turgor are normal, no rashes or significant lesions EYES: normal, conjunctiva are pink and non-injected, sclera clear OROPHARYNX:no exudate, no erythema and lips, buccal mucosa, and tongue normal  NECK: supple, thyroid normal size, non-tender, without nodularity LYMPH:  no palpable lymphadenopathy in the cervical, axillary or inguinal LUNGS: clear to auscultation and percussion with normal breathing effort HEART: regular rate & rhythm and no murmurs and no lower extremity edema ABDOMEN:abdomen soft, non-tender and normal bowel sounds Musculoskeletal:no cyanosis of digits and no clubbing  PSYCH: alert & oriented x 3 with fluent speech NEURO: no focal motor/sensory deficits  LABORATORY DATA:  I have reviewed the data as listed CBC Latest Ref Rng 04/14/2015 03/05/2015 01/01/2015  WBC 3.9 - 10.3 10e3/uL 6.1 6.3 9.1  Hemoglobin 11.6 - 15.9 g/dL  12.3 11.2(L) 12.4  Hematocrit 34.8 - 46.6 % 37.8 34.0(L) 37.6  Platelets 145 - 400 10e3/uL 289 287 282   Retic % 0.70 - 2.10 % 0.86      Retic Ct Abs 33.70 - 90.70 10e3/uL 33.80       CMP Latest Ref Rng 07/08/2014 08/17/2012 12/09/2011  Glucose 70 - 140 mg/dl 239(H) 139(H) 218(H)  BUN 7.0 - 26.0 mg/dL 13.4 16 5(L)  Creatinine 0.6 - 1.1 mg/dL 0.8 0.64 0.65  Sodium 136 - 145 mEq/L 138 138 135  Potassium 3.5 - 5.1 mEq/L 4.6 3.7 4.7  Chloride 96 - 112 mEq/L - 101 99  CO2 22 - 29 mEq/L 28 28 27   Calcium 8.4 - 10.4 mg/dL 8.8 9.2 8.6  Total Protein 6.4 - 8.3 g/dL 6.8 - 6.1  Total Bilirubin 0.20 - 1.20 mg/dL 0.20 - 0.1(L)  Alkaline Phos 40 - 150 U/L 63 - 62  AST 5 - 34 U/L 10 - 38(H)  ALT 0 - 55 U/L 13 - 17   Results for SHANEKIA, LATELLA (MRN [id number]) as of 04/14/2015 07:30  Ref. Range 09/19/2014 12:23 01/01/2015 10:44 03/05/2015 10:12  Iron Latest Ref Range: 41-142 ug/dL 30 (L) 32 (L) 38 (L)  UIBC Latest Ref Range: 120-384 ug/dL 214 227 189  TIBC Latest Ref Range: 236-444 ug/dL 244 259 228 (L)  %SAT Latest Ref Range: 21-57 % 12 (L) 12 (L) 17 (L)  Ferritin Latest Ref Range: 9-269 ng/ml 110 218 196      RADIOGRAPHIC STUDIES: I have personally reviewed the radiological images as listed and agreed with the findings in the report. No results found.  ASSESSMENT & PLAN:  49 year old female with past medical history of hypertension, diabetes, obesity, and chronic anemia presents for evaluation of her anemia.  1. 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 -Iron study showed low serum iron, low saturation and low ferritin (17), consistent with iron deficient anemia. This is likely secondary to her heavy menstrual period. Her prior GI workup was negative. - I reviewed her other lab test results with her. She has normal T01,  folic acid levels, SPEP negative, erythropoietin level elevated. -She responded to [place],  anemia resolved -Repeated on study last month showed normal ferritin at 196, however her TIBC, serum iron and transferrin saturation are low, which is consistent with anemia of chronic disease. -Given her fatigue and a low serum iron, I'll give her IV Feraheme 510 mg once next week. -She did not tolerate oral iron before. -We'll continue to monitor her CBC and iron studies every 2 months. -She is scheduled to have a EGD and colonoscopy next months.  2. Hypertension, diabetes, obesity She will continue follow-up with her primary care physician for this medical issues.  3. Fatigue, hot flush  -Possible perimenopause -She will follow-up with her primary care physician also.  PLAN -IV Feraheme infusion once next week. -Repeat CBC and iron study in 2 months, I'll see her back in 4 months.  All questions were answered. The patient knows to call the clinic with any problems, questions or concerns. I spent 15 minutes counseling the patient face to face. The total time spent in the appointment was 20 minutes and more than 50% was on counseling.     Truitt Merle, MD 04/14/2015   1:43 PM

## 2015-04-14 NOTE — Telephone Encounter (Signed)
Per staff message and POF I have scheduled appts. Advised scheduler of appts. JMW  

## 2015-04-22 ENCOUNTER — Ambulatory Visit: Payer: Federal, State, Local not specified - PPO

## 2015-04-29 ENCOUNTER — Ambulatory Visit: Payer: Federal, State, Local not specified - PPO

## 2015-05-01 ENCOUNTER — Other Ambulatory Visit: Payer: Self-pay | Admitting: Endocrinology

## 2015-05-01 DIAGNOSIS — E042 Nontoxic multinodular goiter: Secondary | ICD-10-CM

## 2015-05-08 ENCOUNTER — Ambulatory Visit
Admission: RE | Admit: 2015-05-08 | Discharge: 2015-05-08 | Disposition: A | Discharge status: Home / Self Care | Payer: Federal, State, Local not specified - PPO | Source: Ambulatory Visit | Attending: Endocrinology | Admitting: Endocrinology

## 2015-05-08 DIAGNOSIS — E042 Nontoxic multinodular goiter: Secondary | ICD-10-CM

## 2015-06-10 IMAGING — US US ABDOMEN COMPLETE
1 series · 14 of 25 positions shown · non-contrast
Comparison: None.

CLINICAL DATA: Right upper quadrant pain

EXAM:
ULTRASOUND ABDOMEN COMPLETE

[Series 1: us abdomen complete · 0.23mm/px · 14 of 81 slices shown]
[im 1/81]
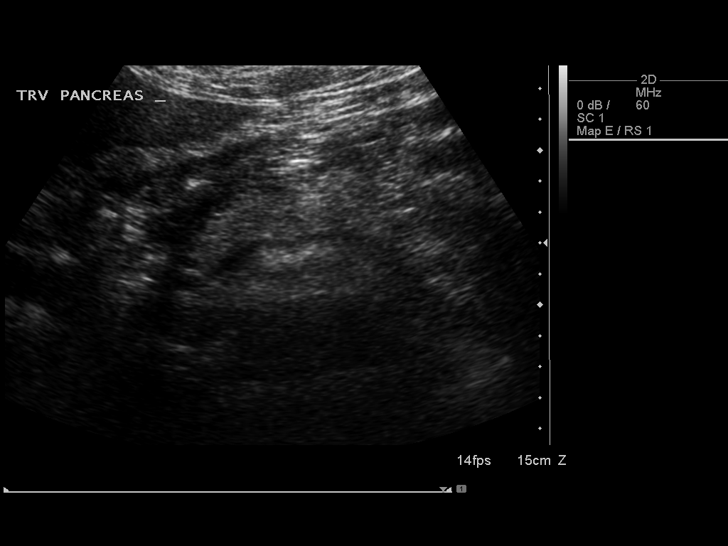
[im 7/81]
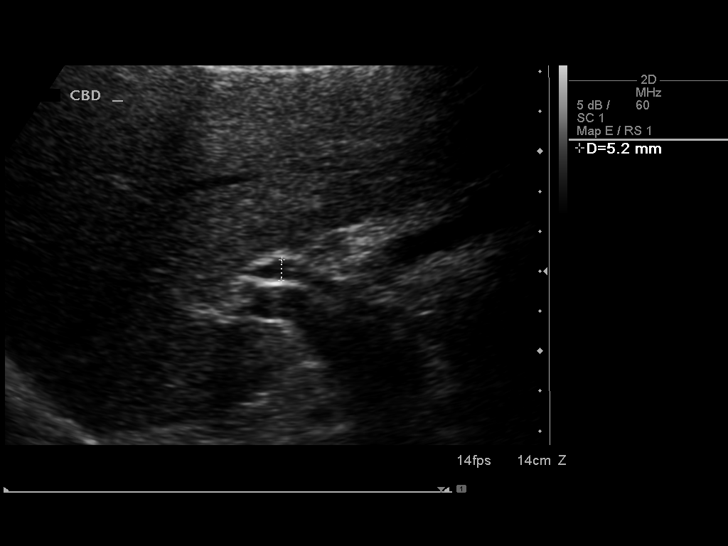
[im 14/81]
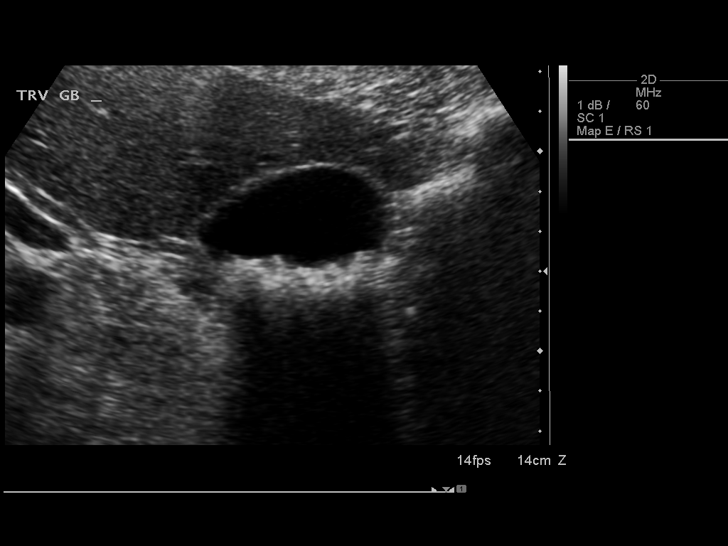
[im 21/81]
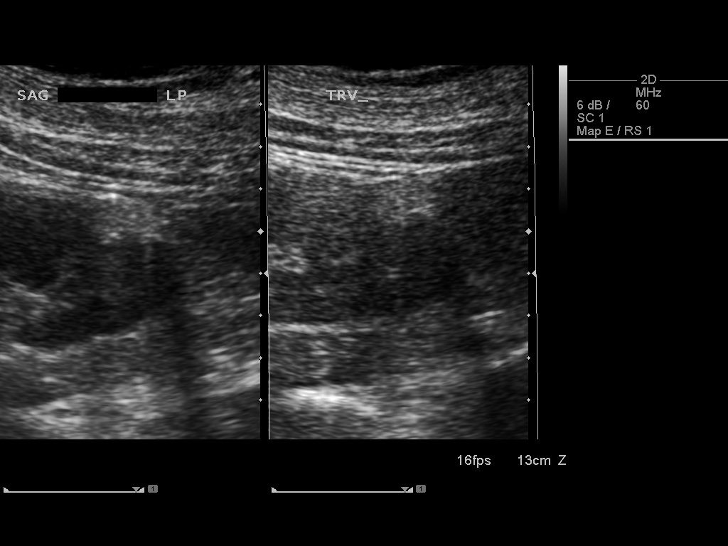
[im 27/81]
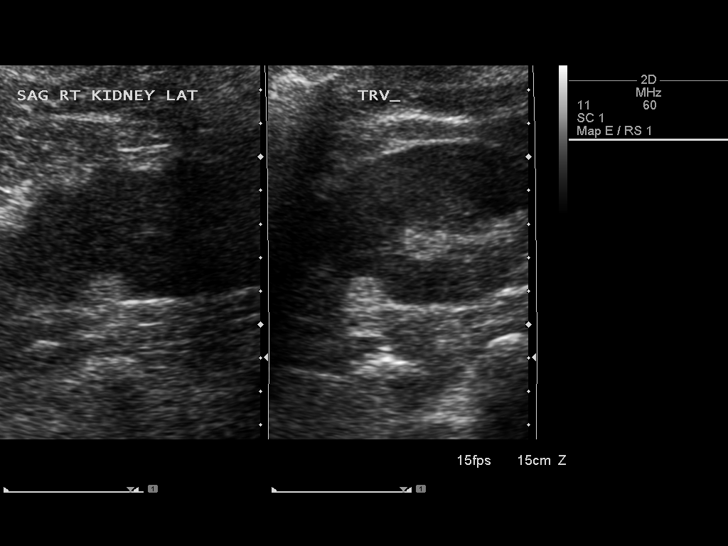
[im 31/81]
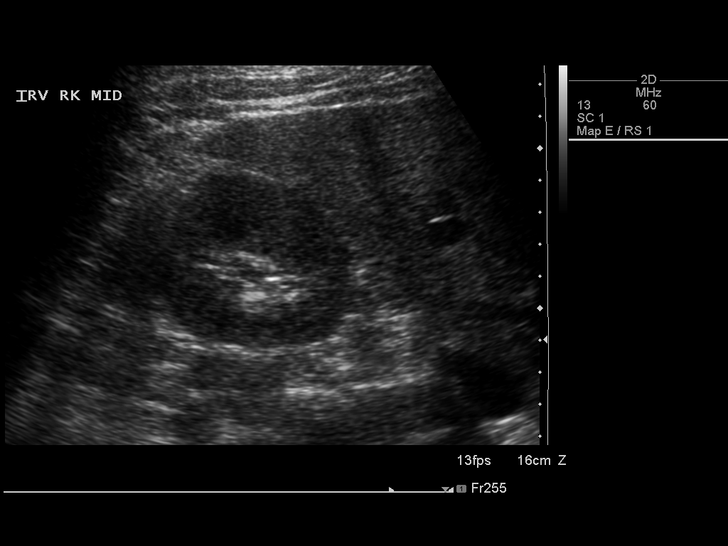
[im 37/81]
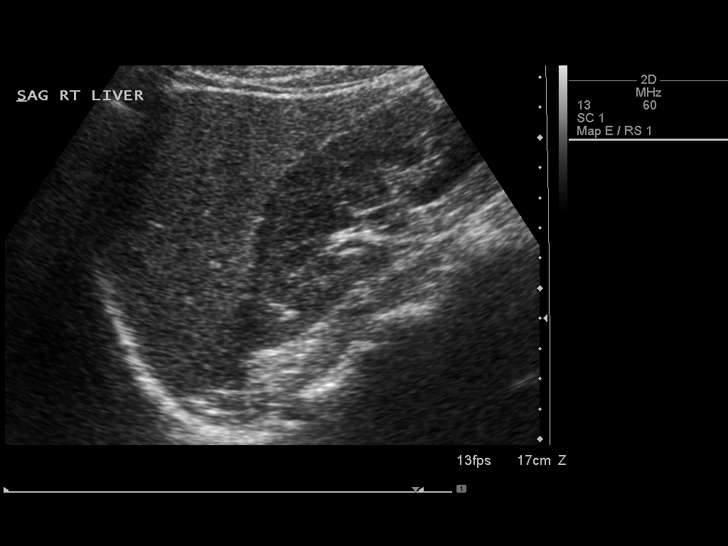
[im 44/81]
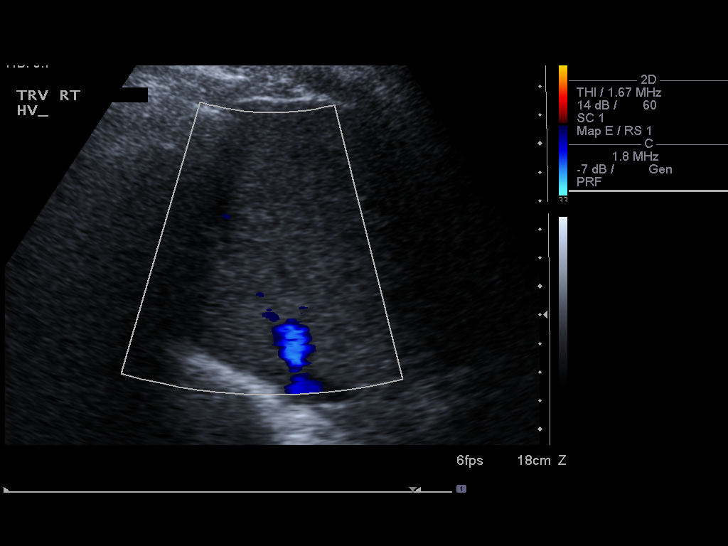
[im 51/81]
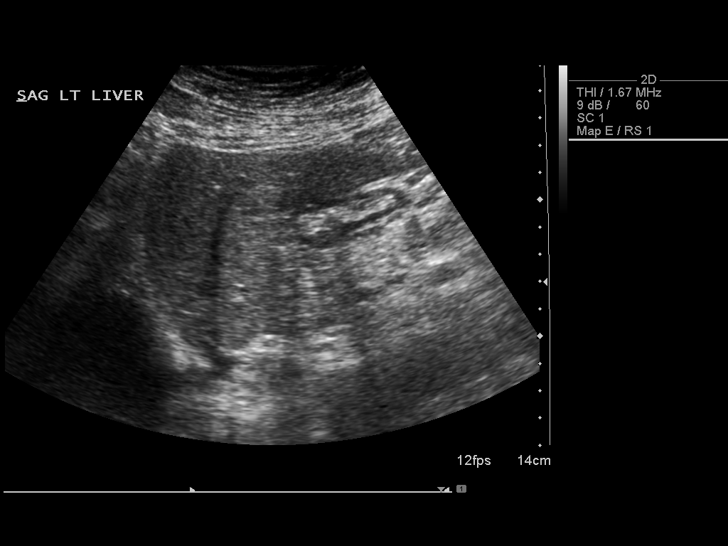
[im 54/81]
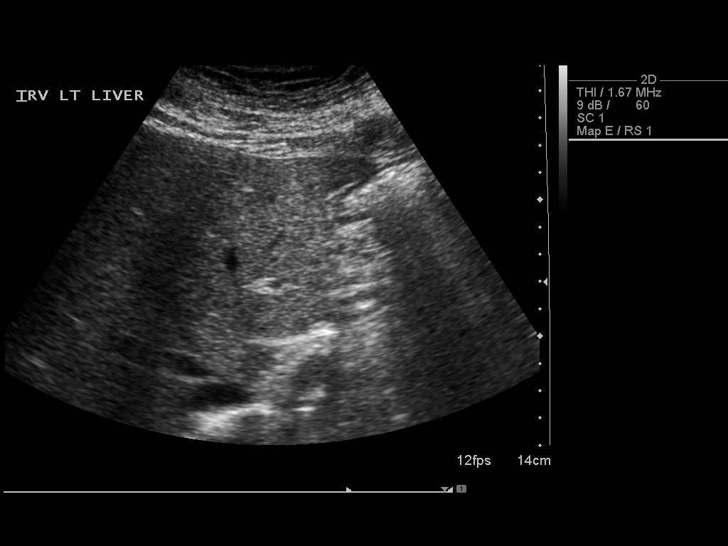
[im 61/81]
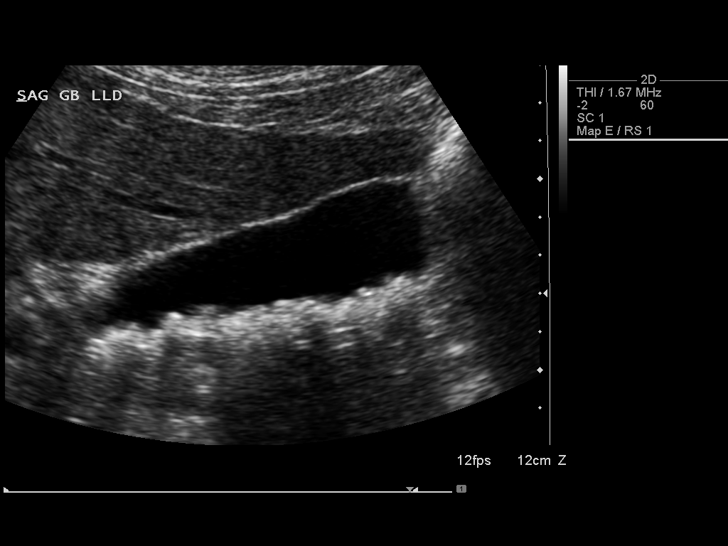
[im 67/81]
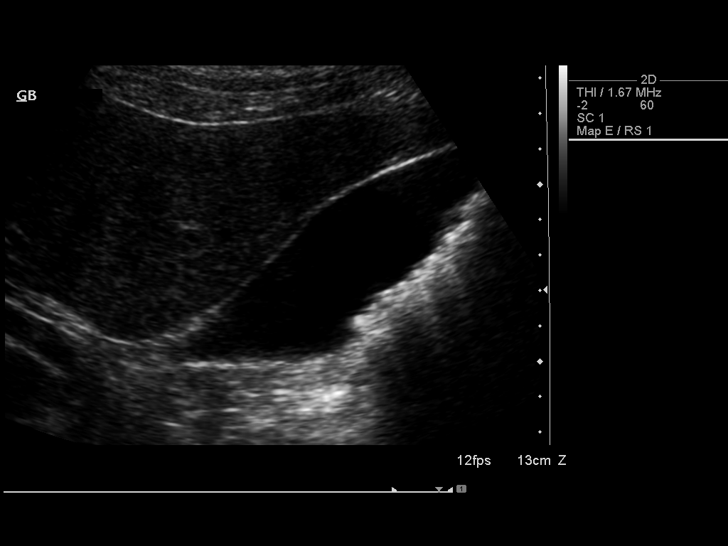
[im 74/81]
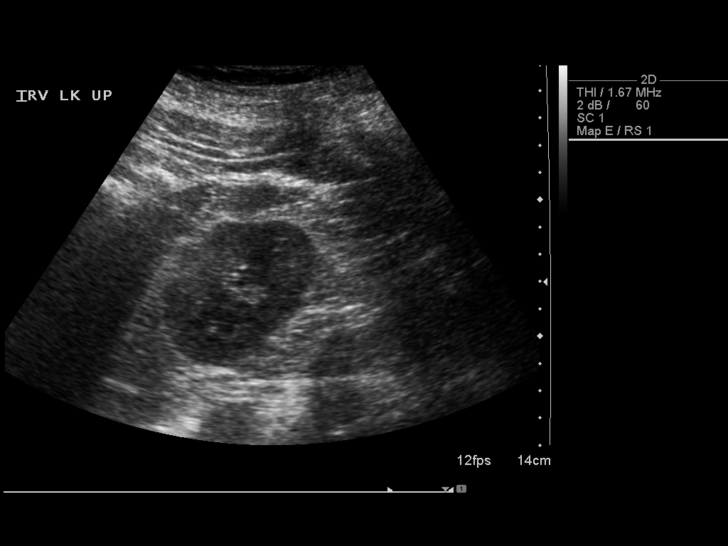
[im 81/81]
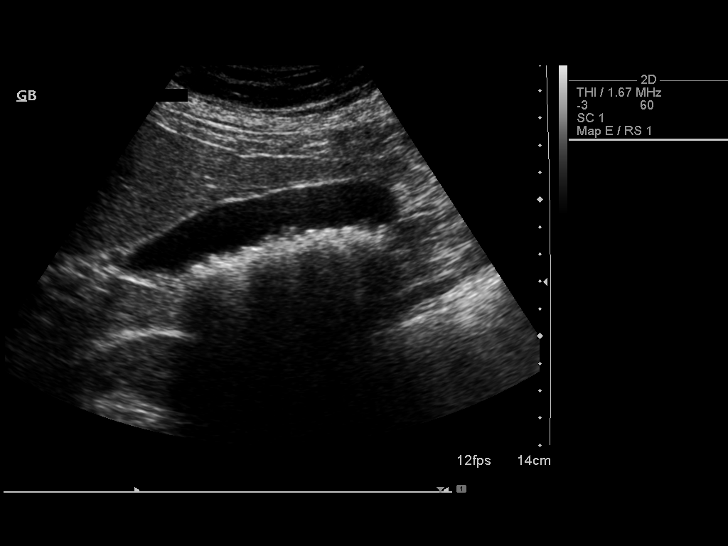

[14 of 25 positions shown; findings below may reference images not displayed]

FINDINGS: Gallbladder: Numerous small gallstones. Negative for gallbladder
wall thickening. Negative sonographic Murphy sign.

Common bile duct: Diameter: 5.2 mm

Liver: No focal lesion identified. Within normal limits in
parenchymal echogenicity.

IVC: No abnormality visualized.

Pancreas: Visualized portion unremarkable.

Spleen: Size and appearance within normal limits.

Right Kidney: Length: 12.4 cm.. 17 mm hyperechoic lesion lateral
cortex. This may contain fat and represent angiomyolipoma.

Left Kidney: Length: 12.5 cm. Echogenicity within normal limits. No
mass or hydronephrosis visualized.

Abdominal aorta: No aneurysm visualized.

Other findings: None.
IMPRESSION: Cholelithiasis without ductal dilatation

17 mm hyperechoic right renal lesion most likely angiomyolipoma.

## 2015-06-16 ENCOUNTER — Other Ambulatory Visit: Payer: Federal, State, Local not specified - PPO

## 2015-06-19 ENCOUNTER — Telehealth: Payer: Self-pay | Admitting: Hematology

## 2015-06-19 NOTE — Telephone Encounter (Signed)
pt cld to sch lab appt-gave lab time & date

## 2015-06-24 ENCOUNTER — Other Ambulatory Visit: Payer: Federal, State, Local not specified - PPO

## 2015-08-18 ENCOUNTER — Telehealth: Payer: Self-pay | Admitting: Hematology

## 2015-08-18 ENCOUNTER — Other Ambulatory Visit (HOSPITAL_BASED_OUTPATIENT_CLINIC_OR_DEPARTMENT_OTHER): Payer: Federal, State, Local not specified - PPO

## 2015-08-18 ENCOUNTER — Ambulatory Visit (HOSPITAL_BASED_OUTPATIENT_CLINIC_OR_DEPARTMENT_OTHER): Payer: Federal, State, Local not specified - PPO | Admitting: Hematology

## 2015-08-18 ENCOUNTER — Encounter: Payer: Self-pay | Admitting: Hematology

## 2015-08-18 VITALS — BP 157/64 | HR 69 | Temp 98.4°F | Resp 18 | Ht 70.0 in | Wt 321.5 lb

## 2015-08-18 DIAGNOSIS — D509 Iron deficiency anemia, unspecified: Secondary | ICD-10-CM

## 2015-08-18 DIAGNOSIS — I10 Essential (primary) hypertension: Secondary | ICD-10-CM | POA: Diagnosis not present

## 2015-08-18 DIAGNOSIS — E119 Type 2 diabetes mellitus without complications: Secondary | ICD-10-CM | POA: Diagnosis not present

## 2015-08-18 DIAGNOSIS — N920 Excessive and frequent menstruation with regular cycle: Secondary | ICD-10-CM

## 2015-08-18 DIAGNOSIS — N951 Menopausal and female climacteric states: Secondary | ICD-10-CM

## 2015-08-18 DIAGNOSIS — R5383 Other fatigue: Secondary | ICD-10-CM

## 2015-08-18 DIAGNOSIS — D500 Iron deficiency anemia secondary to blood loss (chronic): Secondary | ICD-10-CM

## 2015-08-18 LAB — CBC & DIFF AND RETIC
BASO%: 1.4 % (ref 0.0–2.0)
BASOS ABS: 0.1 10*3/uL (ref 0.0–0.1)
EOS%: 2.4 % (ref 0.0–7.0)
Eosinophils Absolute: 0.2 10*3/uL (ref 0.0–0.5)
HCT: 36.3 % (ref 34.8–46.6)
HEMOGLOBIN: 11.8 g/dL (ref 11.6–15.9)
Immature Retic Fract: 8.1 % (ref 1.60–10.00)
LYMPH#: 1.6 10*3/uL (ref 0.9–3.3)
LYMPH%: 25.9 % (ref 14.0–49.7)
MCH: 28.4 pg (ref 25.1–34.0)
MCHC: 32.5 g/dL (ref 31.5–36.0)
MCV: 87.5 fL (ref 79.5–101.0)
MONO#: 0.4 10*3/uL (ref 0.1–0.9)
MONO%: 5.8 % (ref 0.0–14.0)
NEUT%: 64.5 % (ref 38.4–76.8)
NEUTROS ABS: 4 10*3/uL (ref 1.5–6.5)
Platelets: 281 10*3/uL (ref 145–400)
RBC: 4.15 10*6/uL (ref 3.70–5.45)
RDW: 13.9 % (ref 11.2–14.5)
RETIC %: 1.71 % (ref 0.70–2.10)
Retic Ct Abs: 70.97 10*3/uL (ref 33.70–90.70)
WBC: 6.3 10*3/uL (ref 3.9–10.3)

## 2015-08-18 LAB — IRON AND TIBC
%SAT: 16 % — ABNORMAL LOW (ref 21–57)
IRON: 41 ug/dL (ref 41–142)
TIBC: 253 ug/dL (ref 236–444)
UIBC: 211 ug/dL (ref 120–384)

## 2015-08-18 LAB — FERRITIN: FERRITIN: 137 ng/mL (ref 9–269)

## 2015-08-18 MED ORDER — GABAPENTIN 100 MG PO CAPS
100.0000 mg | ORAL_CAPSULE | Freq: Every day | ORAL | Status: DC
Start: 2015-08-18 — End: 2016-04-21

## 2015-08-18 NOTE — Telephone Encounter (Signed)
Pt confirmed labs/ov per 02/06 POF, gave pt AVS and Calendar... KJ °

## 2015-08-18 NOTE — Progress Notes (Signed)
[place]  Telephone:([phone]) [phone] Fax:([phone]  Clinic follow up Note   Patient Care Team: Doree Fudge, MD as PCP - General (Family Medicine) 08/18/2015  CHIEF COMPLAINTS:  Follow up anemia of iron deficiency   HISTORY OF PRESENTING ILLNESS:  Kristina Adams 50 y.o. female is here because of her chronic anemia.   She was found to be anemic about 15 years ago, our electronic medical records shows she has anemia at least back to December 2011, with hemoglobin in the range of 8-10.8, MCV in the high 70s to low 80s range. Her white count and platelet count are usually normal, with a few occasions with slightly elevated every BC and platelet count.   She has period every 28 days, except last month she had twice. It lasts 7 days, very heavy in the first two days, she changes every 3 hours.  She denied any bleeding episodes including hematochezia, melana, hemoptysis, hematuria or epitaxis. No mucosal bleeding or easy bruising.  She used to take oral iron but could not tolerated due to the constipation. She had EGD and EUS twice in 2009, which showed submucosal lesion close to the GE junction measuring 2-3 cm, stable. Biopsy was negative twice. Per patient, she had colonoscopy about 45 years ago which was unremarkable. I do not have the colonoscopy report available.  She denies recent chest pain on exertion, shortness of breath on minimal exertion, pre-syncopal episodes, or palpitations. She had not noticed any recent bleeding such as epistaxis, hematuria or hematochezia The patient denies over the counter NSAID ingestion. She is not on antiplatelets agents. Her last colonoscopy none  She had no prior history or diagnosis of cancer. Her age appropriate screening programs are up-to-date. She denies any pica and eats a variety of diet. She never donated blood or received blood transfusion   She works for [employer] full time. She is pretty active. She feels well overall  at except  mild fatigue. She denies any pain, nausea, cough, change of her bowel habits, or any recent episodes of fever chill, no recent weight loss.   TREATMENT: iv Feraheme as needed, received in 07/2014 and 09/2014   INTERIM HISTORY: Kristina Adams returns for follow up.  She is doing well overall. Her menstrual period is irregular,  and less heavy than before. She has it every 2 weeks to 2 months.  No other signs of bleeding. She complains of insomnia, in the hot flashes and night. She has tried melatonin, Tylenol PM, and Ambien before,  Not very effective. She sleeps 3-4 hours a night. She has a moderate fatigue, able to function well at home. She had bronchitis last months , has completely recovered.   MEDICAL HISTORY:  Past Medical History  Diagnosis Date  . Diabetes mellitus   . DDD (degenerative disc disease)   . Chronic back pain 12/03/2011  . Appendicitis, acute 12/03/2011  . Diabetes mellitus type 2, insulin dependent ([place]) 12/03/2011  . Morbid obesity with BMI of 45.0-49.9, adult ([place]) 12/03/2011  . Blood transfusion   . Thyroid disease   . Hypertension     sees Dr. Darron Doom, [facility], [place]  . Hypertension 12/03/2011  . Sleep apnea 12/03/2011    last sleep study May 2013  . Urinary tract bacterial infections     hx of  . GERD (gastroesophageal reflux disease)   . H/O hiatal hernia   . Headache(784.0)   . Anemia 12/09/2011    child birth  .  Degenerative disc disease, cervical     low back area    SURGICAL HISTORY: Past Surgical History  Procedure Laterality Date  . Cesarean section    . Laparoscopic appendectomy  12/03/2011    Procedure: APPENDECTOMY LAPAROSCOPIC;  Surgeon: [name], DO;  Location: [facility];  Service: General;  Laterality: N/A;  drainage of intra-abdominal abscess   . Appendectomy    . Tubal ligation    . Vitrectomy    . Pars plana vitrectomy Left 08/17/2012    Procedure: PARS PLANA VITRECTOMY WITH 25 GAUGE;  Surgeon: [name],  MD;  Location: [place];  Service: Ophthalmology;  Laterality: Left;  . Repair of complex traction retinal detachment Left 08/17/2012    Procedure: REPAIR OF COMPLEX TRACTION RETINAL DETACHMENT;  Surgeon: [name], MD;  Location: [place];  Service: Ophthalmology;  Laterality: Left;    SOCIAL HISTORY: History   Social History  . Marital Status: Divorced    Spouse Name: N/A    Number of Children: 82 yo daughter and 17 yo  son  . Years of Education: N/A   Occupational History  . Not on file.   Social History Main Topics  . Smoking status: Never   . Smokeless tobacco: Never Used  . Alcohol Use: 0.6 oz/week    1 Glasses of wine per week  . Drug Use: No  . Sexual Activity: Yes    Birth Control/ Protection: Pill   Other Topics Concern  . Not on file   Social History Narrative  . No narrative on file    FAMILY HISTORY: Family History  Problem Relation Age of Onset  . Cancer Mother 71    breast cancer   . Diabetes Other   . Cancer Other   . Hypertension Other     ALLERGIES:  is allergic to latex and iohexol.  MEDICATIONS:  Current Outpatient Prescriptions  Medication Sig Dispense Refill  . cholecalciferol (VITAMIN D) 1000 UNITS tablet Take 2,000 Units by mouth daily.    . cyclobenzaprine (FLEXERIL) 10 MG tablet Take 10 mg by mouth 3 (three) times daily as needed.    . glyBURIDE (DIABETA) 5 MG tablet Take 5 mg by mouth daily.    . hydrochlorothiazide (HYDRODIURIL) 25 MG tablet Take 25 mg by mouth daily.    . Insulin Glargine (LANTUS) 100 UNIT/ML Solostar Pen Inject 80 Units into the skin daily.    Marland Kitchen lisinopril (PRINIVIL,ZESTRIL) 40 MG tablet Take 40 mg by mouth daily.    . meloxicam (MOBIC) 15 MG tablet Take 15 mg by mouth daily.    . methimazole (TAPAZOLE) 5 MG tablet Take 10 mg by mouth daily.    . Multiple Vitamin (MULITIVITAMIN WITH MINERALS) TABS Take 1 tablet by mouth daily.    . vitamin C (ASCORBIC ACID) 500 MG tablet Take 1,000 mg by mouth daily.     No  current facility-administered medications for this visit.    REVIEW OF SYSTEMS:   Constitutional: Denies fevers, chills or abnormal night sweats Eyes: Denies blurriness of vision, double vision or watery eyes Ears, nose, mouth, throat, and face: Denies mucositis or sore throat Respiratory: Denies cough, dyspnea or wheezes Cardiovascular: Denies palpitation, chest discomfort or lower extremity swelling Gastrointestinal:  Denies nausea, heartburn or change in bowel habits Skin: Denies abnormal skin rashes Lymphatics: Denies new lymphadenopathy or easy bruising Neurological:Denies numbness, tingling or new weaknesses Behavioral/Psych: Mood is stable, no new changes  All other systems were reviewed with the patient and are  negative.  PHYSICAL EXAMINATION: ECOG PERFORMANCE STATUS: 0 - Asymptomatic  Filed Vitals:   08/18/15 1028  BP: 157/64  Pulse: 69  Temp: 98.4 F (36.9 C)  Resp: 18   Filed Weights   08/18/15 1028  Weight: 321 lb 8 oz (145.831 kg)    GENERAL:alert, no distress and comfortable SKIN: skin color, texture, turgor are normal, no rashes or significant lesions EYES: normal, conjunctiva are pink and non-injected, sclera clear OROPHARYNX:no exudate, no erythema and lips, buccal mucosa, and tongue normal  NECK: supple, thyroid normal size, non-tender, without nodularity LYMPH:  no palpable lymphadenopathy in the cervical, axillary or inguinal LUNGS: clear to auscultation and percussion with normal breathing effort HEART: regular rate & rhythm and no murmurs and no lower extremity edema ABDOMEN:abdomen soft, non-tender and normal bowel sounds Musculoskeletal:no cyanosis of digits and no clubbing  PSYCH: alert & oriented x 3 with fluent speech NEURO: no focal motor/sensory deficits  LABORATORY DATA:  I have reviewed the data as listed CBC Latest Ref Rng 08/18/2015 04/14/2015 03/05/2015  WBC 3.9 - 10.3 10e3/uL 6.3 6.1 6.3  Hemoglobin 11.6 - 15.9 g/dL 11.8 12.3 11.2(L)    Hematocrit 34.8 - 46.6 % 36.3 37.8 34.0(L)  Platelets 145 - 400 10e3/uL 281 289 287    CMP Latest Ref Rng 07/08/2014 08/17/2012 12/09/2011  Glucose 70 - 140 mg/dl 239(H) 139(H) 218(H)  BUN 7.0 - 26.0 mg/dL 13.4 16 5(L)  Creatinine 0.6 - 1.1 mg/dL 0.8 0.64 0.65  Sodium 136 - 145 mEq/L 138 138 135  Potassium 3.5 - 5.1 mEq/L 4.6 3.7 4.7  Chloride 96 - 112 mEq/L - 101 99  CO2 22 - 29 mEq/L 28 28 27   Calcium 8.4 - 10.4 mg/dL 8.8 9.2 8.6  Total Protein 6.4 - 8.3 g/dL 6.8 - 6.1  Total Bilirubin 0.20 - 1.20 mg/dL 0.20 - 0.1(L)  Alkaline Phos 40 - 150 U/L 63 - 62  AST 5 - 34 U/L 10 - 38(H)  ALT 0 - 55 U/L 13 - 17   Results for Kristina Adams, Kristina Adams (MRN [id number]) as of 08/18/2015 06:32  Ref. Range 01/01/2015 10:44 03/05/2015 10:12 04/14/2015 10:33  Iron Latest Ref Range: 41-142 ug/dL 32 (L) 38 (L) 46  UIBC Latest Ref Range: 120-384 ug/dL 227 189 210  TIBC Latest Ref Range: 236-444 ug/dL 259 228 (L) 255  %SAT Latest Ref Range: 21-57 % 12 (L) 17 (L) 18 (L)  Ferritin Latest Ref Range: 9-269 ng/ml 218 196 192     RADIOGRAPHIC STUDIES: I have personally reviewed the radiological images as listed and agreed with the findings in the report. No results found.  ASSESSMENT & PLAN:  50 year old female with past medical history of hypertension, diabetes, obesity, and chronic anemia presents for evaluation of her anemia.  1. 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 -Iron study showed low serum iron, low saturation and low ferritin (17), consistent with iron deficient anemia. This is likely secondary to her heavy menstrual period. Her prior GI workup was negative. - I reviewed her other lab test results with her. She has normal 123456, folic acid levels, SPEP negative, erythropoietin level elevated. -She responded to Feraheme, anemia  resolved -She did not tolerate oral iron before. -We'll continue to monitor her CBC and iron studies every 3 months. - today's are study result is still pending.   2.  Hypertension, diabetes, obesity -She will continue follow-up with her  Endocrinologist for her hypertension and diabetes. She does not have PCP. - her blood pressure is slightly elevated today, I encouraged her to check blood pressure at home - we discussed weight loss  3. Fatigue, hot flush,  And insomnia -Possible perimenopause - I discussed sleep hygiene with her - I recommend her try Neurontin , starting 100 mg at bedtime, can be increased to 300 mg at bedtime, for her hot flashes and insomnia.  Benefit and the potential side effects we discussed with her, and her I sent a prescription to her pharmacy today.  She agrees to try  PLAN -- I'll call her with her iron study results. -Repeat CBC and iron study in 3 months, I'll see her back in 6 months. - I sent a new prescription of Neurontin to her pharmacy today for her hot flash and insomnia  All questions were answered. The patient knows to call the clinic with any problems, questions or concerns. I spent 20 minutes counseling the patient face to face. The total time spent in the appointment was 25 minutes and more than 50% was on counseling.     Truitt Merle, MD 08/18/2015   10:49 AM

## 2015-08-22 ENCOUNTER — Telehealth: Payer: Self-pay | Admitting: *Deleted

## 2015-08-22 NOTE — Telephone Encounter (Signed)
-----   Message from Truitt Merle, MD sent at 08/18/2015  1:06 PM EST ----- Janifer Adie, could you call her and let her know that her iron study was normal, no need for iv iron this time. Thanks.  Truitt Merle  08/18/2015

## 2015-08-22 NOTE — Progress Notes (Signed)
Left message to call back to discuss labs

## 2015-08-22 NOTE — Telephone Encounter (Signed)
Pt returned call via vm & call returned by this nurse & informed that iron study normal per Dr Burr Medico & no need for IV iron.  Pt expressed understanding.

## 2015-11-05 DIAGNOSIS — R87619 Unspecified abnormal cytological findings in specimens from cervix uteri: Secondary | ICD-10-CM | POA: Insufficient documentation

## 2015-11-05 DIAGNOSIS — K219 Gastro-esophageal reflux disease without esophagitis: Secondary | ICD-10-CM | POA: Insufficient documentation

## 2015-11-05 DIAGNOSIS — Z794 Long term (current) use of insulin: Secondary | ICD-10-CM

## 2015-11-05 DIAGNOSIS — I10 Essential (primary) hypertension: Secondary | ICD-10-CM | POA: Insufficient documentation

## 2015-11-05 DIAGNOSIS — E0590 Thyrotoxicosis, unspecified without thyrotoxic crisis or storm: Secondary | ICD-10-CM | POA: Insufficient documentation

## 2015-11-05 DIAGNOSIS — IMO0002 Reserved for concepts with insufficient information to code with codable children: Secondary | ICD-10-CM | POA: Insufficient documentation

## 2015-11-05 DIAGNOSIS — E1142 Type 2 diabetes mellitus with diabetic polyneuropathy: Secondary | ICD-10-CM | POA: Insufficient documentation

## 2015-11-05 DIAGNOSIS — E1165 Type 2 diabetes mellitus with hyperglycemia: Secondary | ICD-10-CM

## 2015-11-17 ENCOUNTER — Other Ambulatory Visit: Payer: Federal, State, Local not specified - PPO

## 2015-11-17 ENCOUNTER — Telehealth: Payer: Self-pay | Admitting: Hematology

## 2015-11-17 NOTE — Telephone Encounter (Signed)
returned call and lvm confirming lab moved to 5.9 per pt request..

## 2015-11-18 ENCOUNTER — Other Ambulatory Visit: Payer: Federal, State, Local not specified - PPO

## 2016-03-01 ENCOUNTER — Other Ambulatory Visit: Payer: Federal, State, Local not specified - PPO

## 2016-03-01 ENCOUNTER — Ambulatory Visit: Payer: Federal, State, Local not specified - PPO | Admitting: Hematology

## 2016-03-01 NOTE — Progress Notes (Deleted)
[place]  Telephone:(336) [phone] Fax:(336) [phone]  Clinic follow up Note   Patient Care Team: Doree Fudge, MD as PCP - General (Family Medicine) 03/01/2016  CHIEF COMPLAINTS:  Follow up anemia of iron deficiency   HISTORY OF PRESENTING ILLNESS:  Kristina Adams 50 y.o. female is here because of her chronic anemia.   She was found to be anemic about 15 years ago, our electronic medical records shows she has anemia at least back to December 2011, with hemoglobin in the range of 8-10.8, MCV in the high 70s to low 80s range. Her white count and platelet count are usually normal, with a few occasions with slightly elevated every BC and platelet count.   She has period every 28 days, except last month she had twice. It lasts 7 days, very heavy in the first two days, she changes every 3 hours.  She denied any bleeding episodes including hematochezia, melana, hemoptysis, hematuria or epitaxis. No mucosal bleeding or easy bruising.  She used to take oral iron but could not tolerated due to the constipation. She had EGD and EUS twice in 2009, which showed submucosal lesion close to the GE junction measuring 2-3 cm, stable. Biopsy was negative twice. Per patient, she had colonoscopy about 45 years ago which was unremarkable. I do not have the colonoscopy report available.  She denies recent chest pain on exertion, shortness of breath on minimal exertion, pre-syncopal episodes, or palpitations. She had not noticed any recent bleeding such as epistaxis, hematuria or hematochezia The patient denies over the counter NSAID ingestion. She is not on antiplatelets agents. Her last colonoscopy none  She had no prior history or diagnosis of cancer. Her age appropriate screening programs are up-to-date. She denies any pica and eats a variety of diet. She never donated blood or received blood transfusion   She works for [employer] full time. She is pretty active. She feels well overall  at except  mild fatigue. She denies any pain, nausea, cough, change of her bowel habits, or any recent episodes of fever chill, no recent weight loss.   TREATMENT: iv Feraheme as needed, received in 07/2014 and 09/2014   INTERIM HISTORY: Kristina Adams returns for follow up.  She is doing well overall. Her menstrual period is irregular,  and less heavy than before. She has it every 2 weeks to 2 months.  No other signs of bleeding. She complains of insomnia, in the hot flashes and night. She has tried melatonin, Tylenol PM, and Ambien before,  Not very effective. She sleeps 3-4 hours a night. She has a moderate fatigue, able to function well at home. She had bronchitis last months , has completely recovered.   MEDICAL HISTORY:  Past Medical History:  Diagnosis Date  . Anemia 12/09/2011   child birth  . Appendicitis, acute 12/03/2011  . Blood transfusion   . Chronic back pain 12/03/2011  . DDD (degenerative disc disease)   . Degenerative disc disease, cervical    low back area  . Diabetes mellitus   . Diabetes mellitus type 2, insulin dependent ([place]) 12/03/2011  . GERD (gastroesophageal reflux disease)   . H/O hiatal hernia   . Headache(784.0)   . Hypertension    sees Dr. Darron Doom, [facility], [place]  . Hypertension 12/03/2011  . Morbid obesity with BMI of 45.0-49.9, adult ([place]) 12/03/2011  . Sleep apnea 12/03/2011   last sleep study May 2013  . Thyroid disease   . Urinary tract  bacterial infections    hx of    SURGICAL HISTORY: Past Surgical History:  Procedure Laterality Date  . APPENDECTOMY    . CESAREAN SECTION    . LAPAROSCOPIC APPENDECTOMY  12/03/2011   Procedure: APPENDECTOMY LAPAROSCOPIC;  Surgeon: [name], DO;  Location: [facility];  Service: General;  Laterality: N/A;  drainage of intra-abdominal abscess   . PARS PLANA VITRECTOMY Left 08/17/2012   Procedure: PARS PLANA VITRECTOMY WITH 25 GAUGE;  Surgeon: [name], MD;  Location: [facility];  Service:  Ophthalmology;  Laterality: Left;  . 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   Procedure: REPAIR OF COMPLEX TRACTION RETINAL DETACHMENT;  Surgeon: [name], MD;  Location: [facility];  Service: Ophthalmology;  Laterality: Left;  . TUBAL LIGATION    . VITRECTOMY      SOCIAL HISTORY: History   Social History  . Marital Status: Divorced    Spouse Name: N/A    Number of Children: 12 yo daughter and 79 yo  son  . Years of Education: N/A   Occupational History  . Not on file.   Social History Main Topics  . Smoking status: Never   . Smokeless tobacco: Never Used  . Alcohol Use: 0.6 oz/week    1 Glasses of wine per week  . Drug Use: No  . Sexual Activity: Yes    Birth Control/ Protection: Pill   Other Topics Concern  . Not on file   Social History Narrative  . No narrative on file    FAMILY HISTORY: Family History  Problem Relation Age of Onset  . Cancer Mother 1    breast cancer   . Diabetes Other   . Cancer Other   . Hypertension Other     ALLERGIES:  is allergic to latex and iohexol.  MEDICATIONS:  Current Outpatient Prescriptions  Medication Sig Dispense Refill  . cholecalciferol (VITAMIN D) 1000 UNITS tablet Take 2,000 Units by mouth daily.    . cyclobenzaprine (FLEXERIL) 10 MG tablet Take 10 mg by mouth 3 (three) times daily as needed.    . gabapentin (NEURONTIN) 100 MG capsule Take 1 capsule (100 mg total) by mouth at bedtime. 30 capsule 1  . glyBURIDE (DIABETA) 5 MG tablet Take 5 mg by mouth daily.    . hydrochlorothiazide (HYDRODIURIL) 25 MG tablet Take 25 mg by mouth daily.    . Insulin Glargine (LANTUS) 100 UNIT/ML Solostar Pen Inject 80 Units into the skin daily.    Marland Kitchen lisinopril (PRINIVIL,ZESTRIL) 40 MG tablet Take 40 mg by mouth daily.    . meloxicam (MOBIC) 15 MG tablet Take 15 mg by mouth daily.    . methimazole (TAPAZOLE) 5 MG tablet Take 10 mg by mouth daily.    . Multiple Vitamin (MULITIVITAMIN WITH MINERALS) TABS Take 1  tablet by mouth daily.    . vitamin C (ASCORBIC ACID) 500 MG tablet Take 1,000 mg by mouth daily.     No current facility-administered medications for this visit.     REVIEW OF SYSTEMS:   Constitutional: Denies fevers, chills or abnormal night sweats Eyes: Denies blurriness of vision, double vision or watery eyes Ears, nose, mouth, throat, and face: Denies mucositis or sore throat Respiratory: Denies cough, dyspnea or wheezes Cardiovascular: Denies palpitation, chest discomfort or lower extremity swelling Gastrointestinal:  Denies nausea, heartburn or change in bowel habits Skin: Denies abnormal skin rashes Lymphatics: Denies new lymphadenopathy or easy bruising Neurological:Denies numbness, tingling or new weaknesses Behavioral/Psych: Mood is stable,  no new changes  All other systems were reviewed with the patient and are negative.  PHYSICAL EXAMINATION: ECOG PERFORMANCE STATUS: 0 - Asymptomatic  There were no vitals filed for this visit. There were no vitals filed for this visit.  GENERAL:alert, no distress and comfortable SKIN: skin color, texture, turgor are normal, no rashes or significant lesions EYES: normal, conjunctiva are pink and non-injected, sclera clear OROPHARYNX:no exudate, no erythema and lips, buccal mucosa, and tongue normal  NECK: supple, thyroid normal size, non-tender, without nodularity LYMPH:  no palpable lymphadenopathy in the cervical, axillary or inguinal LUNGS: clear to auscultation and percussion with normal breathing effort HEART: regular rate & rhythm and no murmurs and no lower extremity edema ABDOMEN:abdomen soft, non-tender and normal bowel sounds Musculoskeletal:no cyanosis of digits and no clubbing  PSYCH: alert & oriented x 3 with fluent speech NEURO: no focal motor/sensory deficits  LABORATORY DATA:  I have reviewed the data as listed CBC Latest Ref Rng & Units 08/18/2015 04/14/2015 03/05/2015  WBC 3.9 - 10.3 10e3/uL 6.3 6.1 6.3  Hemoglobin  11.6 - 15.9 g/dL 11.8 12.3 11.2(L)  Hematocrit 34.8 - 46.6 % 36.3 37.8 34.0(L)  Platelets 145 - 400 10e3/uL 281 289 287    CMP Latest Ref Rng & Units 07/08/2014 08/17/2012 12/09/2011  Glucose 70 - 140 mg/dl 239(H) 139(H) 218(H)  BUN 7.0 - 26.0 mg/dL 13.4 16 5(L)  Creatinine 0.6 - 1.1 mg/dL 0.8 0.64 0.65  Sodium 136 - 145 mEq/L 138 138 135  Potassium 3.5 - 5.1 mEq/L 4.6 3.7 4.7  Chloride 96 - 112 mEq/L - 101 99  CO2 22 - 29 mEq/L 28 28 27   Calcium 8.4 - 10.4 mg/dL 8.8 9.2 8.6  Total Protein 6.4 - 8.3 g/dL 6.8 - 6.1  Total Bilirubin 0.20 - 1.20 mg/dL 0.20 - 0.1(L)  Alkaline Phos 40 - 150 U/L 63 - 62  AST 5 - 34 U/L 10 - 38(H)  ALT 0 - 55 U/L 13 - 17   Results for Kristina Adams, Kristina Adams (MRN [id number]) as of 03/01/2016 06:46  Ref. Range 04/14/2015 10:33 08/18/2015 10:03  Iron Latest Ref Range: 41 - 142 ug/dL 46 41  UIBC Latest Ref Range: 120 - 384 ug/dL 210 211  TIBC Latest Ref Range: 236 - 444 ug/dL 255 253  %SAT Latest Ref Range: 21 - 57 % 18 (L) 16 (L)  Ferritin Latest Ref Range: 9 - 269 ng/ml 192 137     RADIOGRAPHIC STUDIES: I have personally reviewed the radiological images as listed and agreed with the findings in the report. No results found.  ASSESSMENT & PLAN:  50 year old female with past medical history of hypertension, diabetes, obesity, and chronic anemia presents for evaluation of her anemia.  1. 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 -Iron study showed low serum iron, low saturation and low ferritin (17), consistent with iron deficient anemia. This is likely secondary to her heavy menstrual period. Her prior GI workup was negative. - I reviewed her other lab test results with her. She has normal 123456, folic acid levels, SPEP negative, erythropoietin level elevated. -She responded to Feraheme, anemia  resolved -She did not tolerate oral iron before. -We'll continue to monitor her CBC and iron studies every 3 months. - today's are study result is still pending.   2. Hypertension, diabetes, obesity -She will continue follow-up  with her  Endocrinologist for her hypertension and diabetes. She does not have PCP. - her blood pressure is slightly elevated today, I encouraged her to check blood pressure at home - we discussed weight loss  3. Fatigue, hot flush,  And insomnia -Possible perimenopause - I discussed sleep hygiene with her - I recommend her try Neurontin , starting 100 mg at bedtime, can be increased to 300 mg at bedtime, for her hot flashes and insomnia.  Benefit and the potential side effects we discussed with her, and her I sent a prescription to her pharmacy today.  She agrees to try  PLAN -- I'll call her with her iron study results. -Repeat CBC and iron study in 3 months, I'll see her back in 6 months. - I sent a new prescription of Neurontin to her pharmacy today for her hot flash and insomnia  All questions were answered. The patient knows to call the clinic with any problems, questions or concerns. I spent 20 minutes counseling the patient face to face. The total time spent in the appointment was 25 minutes and more than 50% was on counseling.     Truitt Merle, MD 03/01/2016   6:45 AM

## 2016-03-02 ENCOUNTER — Telehealth: Payer: Self-pay | Admitting: Hematology

## 2016-03-02 NOTE — Telephone Encounter (Signed)
lvm to inform pt of lab a/ov appts date/times per LOS

## 2016-03-17 ENCOUNTER — Other Ambulatory Visit: Payer: Federal, State, Local not specified - PPO

## 2016-03-24 ENCOUNTER — Telehealth: Payer: Self-pay | Admitting: Hematology

## 2016-03-24 ENCOUNTER — Ambulatory Visit: Payer: Federal, State, Local not specified - PPO | Admitting: Hematology

## 2016-03-24 NOTE — Progress Notes (Deleted)
[facility]  [phone] Fax:(336) [phone]  Clinic follow up Note   Patient Care Team: Doree Fudge, MD as PCP - General (Family Medicine) 03/24/2016  CHIEF COMPLAINTS:  Follow up anemia of iron deficiency   HISTORY OF PRESENTING ILLNESS:  Kristina Adams 50 y.o. female is here because of her chronic anemia.   She was found to be anemic about 15 years ago, our electronic medical records shows she has anemia at least back to December 2011, with hemoglobin in the range of 8-10.8, MCV in the high 70s to low 80s range. Her white count and platelet count are usually normal, with a few occasions with slightly elevated every BC and platelet count.   She has period every 28 days, except last month she had twice. It lasts 7 days, very heavy in the first two days, she changes every 3 hours.  She denied any bleeding episodes including hematochezia, melana, hemoptysis, hematuria or epitaxis. No mucosal bleeding or easy bruising.  She used to take oral iron but could not tolerated due to the constipation. She had EGD and EUS twice in 2009, which showed submucosal lesion close to the GE junction measuring 2-3 cm, stable. Biopsy was negative twice. Per patient, she had colonoscopy about 45 years ago which was unremarkable. I do not have the colonoscopy report available.  She denies recent chest pain on exertion, shortness of breath on minimal exertion, pre-syncopal episodes, or palpitations. She had not noticed any recent bleeding such as epistaxis, hematuria or hematochezia The patient denies over the counter NSAID ingestion. She is not on antiplatelets agents. Her last colonoscopy none  She had no prior history or diagnosis of cancer. Her age appropriate screening programs are up-to-date. She denies any pica and eats a variety of diet. She never donated blood or received blood transfusion   She works for [employer] full time. She is pretty active. She feels well overall  at except  mild fatigue. She denies any pain, nausea, cough, change of her bowel habits, or any recent episodes of fever chill, no recent weight loss.   TREATMENT: iv Feraheme as needed, received in 07/2014 and 09/2014   INTERIM HISTORY: Ms Belisario returns for follow up.  She is doing well overall. Her menstrual period is irregular,  and less heavy than before. She has it every 2 weeks to 2 months.  No other signs of bleeding. She complains of insomnia, in the hot flashes and night. She has tried melatonin, Tylenol PM, and Ambien before,  Not very effective. She sleeps 3-4 hours a night. She has a moderate fatigue, able to function well at home. She had bronchitis last months , has completely recovered.   MEDICAL HISTORY:  Past Medical History:  Diagnosis Date  . Anemia 12/09/2011   child birth  . Appendicitis, acute 12/03/2011  . Blood transfusion   . Chronic back pain 12/03/2011  . DDD (degenerative disc disease)   . Degenerative disc disease, cervical    low back area  . Diabetes mellitus   . 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 GERD (gastroesophageal reflux disease)   . H/O hiatal hernia   . Headache(784.0)   . Hypertension    sees Dr. Darron Doom, [facility], [place]  . Hypertension 12/03/2011  . Morbid obesity with BMI of 45.0-49.9, adult ([place]) 12/03/2011  . Sleep apnea 12/03/2011   last sleep study May 2013  . Thyroid disease   . Urinary tract  bacterial infections    hx of    SURGICAL HISTORY: Past Surgical History:  Procedure Laterality Date  . APPENDECTOMY    . CESAREAN SECTION    . LAPAROSCOPIC APPENDECTOMY  12/03/2011   Procedure: APPENDECTOMY LAPAROSCOPIC;  Surgeon: [name], DO;  Location: [facility];  Service: General;  Laterality: N/A;  drainage of intra-abdominal abscess   . PARS PLANA VITRECTOMY Left 08/17/2012   Procedure: PARS PLANA VITRECTOMY WITH 25 GAUGE;  Surgeon: [name], MD;  Location: [facility];  Service:  Ophthalmology;  Laterality: Left;  . 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 TUBAL LIGATION    . VITRECTOMY      SOCIAL HISTORY: History   Social History  . Marital Status: Divorced    Spouse Name: N/A    Number of Children: 58 yo daughter and 40 yo  son  . Years of Education: N/A   Occupational History  . Not on file.   Social History Main Topics  . Smoking status: Never   . Smokeless tobacco: Never Used  . Alcohol Use: 0.6 oz/week    1 Glasses of wine per week  . Drug Use: No  . Sexual Activity: Yes    Birth Control/ Protection: Pill   Other Topics Concern  . Not on file   Social History Narrative  . No narrative on file    FAMILY HISTORY: Family History  Problem Relation Age of Onset  . Cancer Mother 105    breast cancer   . Diabetes Other   . Cancer Other   . Hypertension Other     ALLERGIES:  is allergic to latex and iohexol.  MEDICATIONS:  Current Outpatient Prescriptions  Medication Sig Dispense Refill  . cholecalciferol (VITAMIN D) 1000 UNITS tablet Take 2,000 Units by mouth daily.    . cyclobenzaprine (FLEXERIL) 10 MG tablet Take 10 mg by mouth 3 (three) times daily as needed.    . gabapentin (NEURONTIN) 100 MG capsule Take 1 capsule (100 mg total) by mouth at bedtime. 30 capsule 1  . glyBURIDE (DIABETA) 5 MG tablet Take 5 mg by mouth daily.    . hydrochlorothiazide (HYDRODIURIL) 25 MG tablet Take 25 mg by mouth daily.    . Insulin Glargine (LANTUS) 100 UNIT/ML Solostar Pen Inject 80 Units into the skin daily.    Marland Kitchen lisinopril (PRINIVIL,ZESTRIL) 40 MG tablet Take 40 mg by mouth daily.    . meloxicam (MOBIC) 15 MG tablet Take 15 mg by mouth daily.    . methimazole (TAPAZOLE) 5 MG tablet Take 10 mg by mouth daily.    . Multiple Vitamin (MULITIVITAMIN WITH MINERALS) TABS Take 1  tablet by mouth daily.    . vitamin C (ASCORBIC ACID) 500 MG tablet Take 1,000 mg by mouth daily.     No current facility-administered medications for this visit.     REVIEW OF SYSTEMS:   Constitutional: Denies fevers, chills or abnormal night sweats Eyes: Denies blurriness of vision, double vision or watery eyes Ears, nose, mouth, throat, and face: Denies mucositis or sore throat Respiratory: Denies cough, dyspnea or wheezes Cardiovascular: Denies palpitation, chest discomfort or lower extremity swelling Gastrointestinal:  Denies nausea, heartburn or change in bowel habits Skin: Denies abnormal skin rashes Lymphatics: Denies new lymphadenopathy or easy bruising Neurological:Denies numbness, tingling or new weaknesses Behavioral/Psych: Mood is stable,  no new changes  All other systems were reviewed with the patient and are negative.  PHYSICAL EXAMINATION: ECOG PERFORMANCE STATUS: 0 - Asymptomatic  There were no vitals filed for this visit. There were no vitals filed for this visit.  GENERAL:alert, no distress and comfortable SKIN: skin color, texture, turgor are normal, no rashes or significant lesions EYES: normal, conjunctiva are pink and non-injected, sclera clear OROPHARYNX:no exudate, no erythema and lips, buccal mucosa, and tongue normal  NECK: supple, thyroid normal size, non-tender, without nodularity LYMPH:  no palpable lymphadenopathy in the cervical, axillary or inguinal LUNGS: clear to auscultation and percussion with normal breathing effort HEART: regular rate & rhythm and no murmurs and no lower extremity edema ABDOMEN:abdomen soft, non-tender and normal bowel sounds Musculoskeletal:no cyanosis of digits and no clubbing  PSYCH: alert & oriented x 3 with fluent speech NEURO: no focal motor/sensory deficits  LABORATORY DATA:  I have reviewed the data as listed CBC Latest Ref Rng & Units 08/18/2015 04/14/2015 03/05/2015  WBC 3.9 - 10.3 10e3/uL 6.3 6.1 6.3  Hemoglobin  11.6 - 15.9 g/dL 11.8 12.3 11.2(L)  Hematocrit 34.8 - 46.6 % 36.3 37.8 34.0(L)  Platelets 145 - 400 10e3/uL 281 289 287    CMP Latest Ref Rng & Units 07/08/2014 08/17/2012 12/09/2011  Glucose 70 - 140 mg/dl 239(H) 139(H) 218(H)  BUN 7.0 - 26.0 mg/dL 13.4 16 5(L)  Creatinine 0.6 - 1.1 mg/dL 0.8 0.64 0.65  Sodium 136 - 145 mEq/L 138 138 135  Potassium 3.5 - 5.1 mEq/L 4.6 3.7 4.7  Chloride 96 - 112 mEq/L - 101 99  CO2 22 - 29 mEq/L 28 28 27   Calcium 8.4 - 10.4 mg/dL 8.8 9.2 8.6  Total Protein 6.4 - 8.3 g/dL 6.8 - 6.1  Total Bilirubin 0.20 - 1.20 mg/dL 0.20 - 0.1(L)  Alkaline Phos 40 - 150 U/L 63 - 62  AST 5 - 34 U/L 10 - 38(H)  ALT 0 - 55 U/L 13 - 17   Results for GURJIT, TRICHEL (MRN [id number]) as of 03/24/2016 06:50  Ref. Range 04/14/2015 10:33 08/18/2015 10:03  Iron Latest Ref Range: 41 - 142 ug/dL 46 41  UIBC Latest Ref Range: 120 - 384 ug/dL 210 211  TIBC Latest Ref Range: 236 - 444 ug/dL 255 253  %SAT Latest Ref Range: 21 - 57 % 18 (L) 16 (L)  Ferritin Latest Ref Range: 9 - 269 ng/ml 192 137      RADIOGRAPHIC STUDIES: I have personally reviewed the radiological images as listed and agreed with the findings in the report. No results found.  ASSESSMENT & PLAN:  49 year old female with past medical history of hypertension, diabetes, obesity, and chronic anemia presents for evaluation of her anemia.  1. 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 -Iron study showed low serum iron, low saturation and low ferritin (17), consistent with iron deficient anemia. This is likely secondary to her heavy menstrual period. Her prior GI workup was negative. - I reviewed her other lab test results with her. She has normal 123456, folic acid levels, SPEP negative, erythropoietin level elevated. -She responded to Feraheme, anemia  resolved -She did not tolerate oral iron before. -We'll continue to monitor her CBC and iron studies every 3 months. - today's are study result is still pending.   2. Hypertension, diabetes, obesity -She will continue  follow-up with her  Endocrinologist for her hypertension and diabetes. She does not have PCP. - her blood pressure is slightly elevated today, I encouraged her to check blood pressure at home - we discussed weight loss  3. Fatigue, hot flush,  And insomnia -Possible perimenopause - I discussed sleep hygiene with her - I recommend her try Neurontin , starting 100 mg at bedtime, can be increased to 300 mg at bedtime, for her hot flashes and insomnia.  Benefit and the potential side effects we discussed with her, and her I sent a prescription to her pharmacy today.  She agrees to try  PLAN -- I'll call her with her iron study results. -Repeat CBC and iron study in 3 months, I'll see her back in 6 months. - I sent a new prescription of Neurontin to her pharmacy today for her hot flash and insomnia  All questions were answered. The patient knows to call the clinic with any problems, questions or concerns. I spent 20 minutes counseling the patient face to face. The total time spent in the appointment was 25 minutes and more than 50% was on counseling.     Truitt Merle, MD 03/24/2016

## 2016-03-24 NOTE — Telephone Encounter (Signed)
03/29/2016 Appointment rescheduled to 04/02/2016 per patient request. Patient prefers late afternoon appointments.

## 2016-03-29 ENCOUNTER — Ambulatory Visit: Payer: Federal, State, Local not specified - PPO | Admitting: Hematology

## 2016-03-29 ENCOUNTER — Other Ambulatory Visit: Payer: Federal, State, Local not specified - PPO

## 2016-04-02 ENCOUNTER — Other Ambulatory Visit: Payer: Federal, State, Local not specified - PPO

## 2016-04-02 ENCOUNTER — Ambulatory Visit: Payer: Federal, State, Local not specified - PPO | Admitting: Hematology

## 2016-04-02 NOTE — Progress Notes (Deleted)
[facility]  Telephone:(336) [phone] Fax:(336) [phone]  Clinic follow up Note   Patient Care Team: Doree Fudge, MD as PCP - General (Family Medicine) 04/02/2016  CHIEF COMPLAINTS:  Follow up anemia of iron deficiency   HISTORY OF PRESENTING ILLNESS:  Kristina Adams 50 y.o. female is here because of her chronic anemia.   She was found to be anemic about 15 years ago, our electronic medical records shows she has anemia at least back to December 2011, with hemoglobin in the range of 8-10.8, MCV in the high 70s to low 80s range. Her white count and platelet count are usually normal, with a few occasions with slightly elevated every BC and platelet count.   She has period every 28 days, except last month she had twice. It lasts 7 days, very heavy in the first two days, she changes every 3 hours.  She denied any bleeding episodes including hematochezia, melana, hemoptysis, hematuria or epitaxis. No mucosal bleeding or easy bruising.  She used to take oral iron but could not tolerated due to the constipation. She had EGD and EUS twice in 2009, which showed submucosal lesion close to the GE junction measuring 2-3 cm, stable. Biopsy was negative twice. Per patient, she had colonoscopy about 45 years ago which was unremarkable. I do not have the colonoscopy report available.  She denies recent chest pain on exertion, shortness of breath on minimal exertion, pre-syncopal episodes, or palpitations. She had not noticed any recent bleeding such as epistaxis, hematuria or hematochezia The patient denies over the counter NSAID ingestion. She is not on antiplatelets agents. Her last colonoscopy none  She had no prior history or diagnosis of cancer. Her age appropriate screening programs are up-to-date. She denies any pica and eats a variety of diet. She never donated blood or received blood transfusion   She works for [employer] full time. She is pretty active. She feels well overall  at except  mild fatigue. She denies any pain, nausea, cough, change of her bowel habits, or any recent episodes of fever chill, no recent weight loss.   TREATMENT: iv Feraheme as needed, received in 07/2014 and 09/2014   INTERIM HISTORY: Kristina Adams returns for follow up.  She is doing well overall. Her menstrual period is irregular,  and less heavy than before. She has it every 2 weeks to 2 months.  No other signs of bleeding. She complains of insomnia, in the hot flashes and night. She has tried melatonin, Tylenol PM, and Ambien before,  Not very effective. She sleeps 3-4 hours a night. She has a moderate fatigue, able to function well at home. She had bronchitis last months , has completely recovered.   MEDICAL HISTORY:  Past Medical History:  Diagnosis Date  . Anemia 12/09/2011   child birth  . Appendicitis, acute 12/03/2011  . Blood transfusion   . Chronic back pain 12/03/2011  . DDD (degenerative disc disease)   . Degenerative disc disease, cervical    low back area  . Diabetes mellitus   . Diabetes mellitus type 2, insulin dependent ([place]) 12/03/2011  . GERD (gastroesophageal reflux disease)   . H/O hiatal hernia   . Headache(784.0)   . Hypertension    sees Dr. Darron Doom, [facility], [place]  . Hypertension 12/03/2011  . Morbid obesity with BMI of 45.0-49.9, adult ([place]) 12/03/2011  . Sleep apnea 12/03/2011   last sleep study May 2013  . Thyroid disease   . Urinary tract  bacterial infections    hx of    SURGICAL HISTORY: Past Surgical History:  Procedure Laterality Date  . APPENDECTOMY    . CESAREAN SECTION    . LAPAROSCOPIC APPENDECTOMY  12/03/2011   Procedure: APPENDECTOMY LAPAROSCOPIC;  Surgeon: [name], DO;  Location: [facility];  Service: General;  Laterality: N/A;  drainage of intra-abdominal abscess   . PARS PLANA VITRECTOMY Left 08/17/2012   Procedure: PARS PLANA VITRECTOMY WITH 25 GAUGE;  Surgeon: [name], MD;  Location: [facility];  Service:  Ophthalmology;  Laterality: Left;  . 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 TUBAL LIGATION    . VITRECTOMY      SOCIAL HISTORY: History   Social History  . Marital Status: Divorced    Spouse Name: N/A    Number of Children: 4 yo daughter and 39 yo  son  . Years of Education: N/A   Occupational History  . Not on file.   Social History Main Topics  . Smoking status: Never   . Smokeless tobacco: Never Used  . Alcohol Use: 0.6 oz/week    1 Glasses of wine per week  . Drug Use: No  . Sexual Activity: Yes    Birth Control/ Protection: Pill   Other Topics Concern  . Not on file   Social History Narrative  . No narrative on file    FAMILY HISTORY: Family History  Problem Relation Age of Onset  . Cancer Mother 51    breast cancer   . Diabetes Other   . Cancer Other   . Hypertension Other     ALLERGIES:  is allergic to latex and iohexol.  MEDICATIONS:  Current Outpatient Prescriptions  Medication Sig Dispense Refill  . cholecalciferol (VITAMIN D) 1000 UNITS tablet Take 2,000 Units by mouth daily.    . cyclobenzaprine (FLEXERIL) 10 MG tablet Take 10 mg by mouth 3 (three) times daily as needed.    . gabapentin (NEURONTIN) 100 MG capsule Take 1 capsule (100 mg total) by mouth at bedtime. 30 capsule 1  . glyBURIDE (DIABETA) 5 MG tablet Take 5 mg by mouth daily.    . hydrochlorothiazide (HYDRODIURIL) 25 MG tablet Take 25 mg by mouth daily.    . Insulin Glargine (LANTUS) 100 UNIT/ML Solostar Pen Inject 80 Units into the skin daily.    Marland Kitchen lisinopril (PRINIVIL,ZESTRIL) 40 MG tablet Take 40 mg by mouth daily.    . meloxicam (MOBIC) 15 MG tablet Take 15 mg by mouth daily.    . methimazole (TAPAZOLE) 5 MG tablet Take 10 mg by mouth daily.    . Multiple Vitamin (MULITIVITAMIN WITH MINERALS) TABS Take 1  tablet by mouth daily.    . vitamin C (ASCORBIC ACID) 500 MG tablet Take 1,000 mg by mouth daily.     No current facility-administered medications for this visit.     REVIEW OF SYSTEMS:   Constitutional: Denies fevers, chills or abnormal night sweats Eyes: Denies blurriness of vision, double vision or watery eyes Ears, nose, mouth, throat, and face: Denies mucositis or sore throat Respiratory: Denies cough, dyspnea or wheezes Cardiovascular: Denies palpitation, chest discomfort or lower extremity swelling Gastrointestinal:  Denies nausea, heartburn or change in bowel habits Skin: Denies abnormal skin rashes Lymphatics: Denies new lymphadenopathy or easy bruising Neurological:Denies numbness, tingling or new weaknesses Behavioral/Psych: Mood is stable,  no new changes  All other systems were reviewed with the patient and are negative.  PHYSICAL EXAMINATION: ECOG PERFORMANCE STATUS: 0 - Asymptomatic  There were no vitals filed for this visit. There were no vitals filed for this visit.  GENERAL:alert, no distress and comfortable SKIN: skin color, texture, turgor are normal, no rashes or significant lesions EYES: normal, conjunctiva are pink and non-injected, sclera clear OROPHARYNX:no exudate, no erythema and lips, buccal mucosa, and tongue normal  NECK: supple, thyroid normal size, non-tender, without nodularity LYMPH:  no palpable lymphadenopathy in the cervical, axillary or inguinal LUNGS: clear to auscultation and percussion with normal breathing effort HEART: regular rate & rhythm and no murmurs and no lower extremity edema ABDOMEN:abdomen soft, non-tender and normal bowel sounds Musculoskeletal:no cyanosis of digits and no clubbing  PSYCH: alert & oriented x 3 with fluent speech NEURO: no focal motor/sensory deficits  LABORATORY DATA:  I have reviewed the data as listed CBC Latest Ref Rng & Units 08/18/2015 04/14/2015 03/05/2015  WBC 3.9 - 10.3 10e3/uL 6.3 6.1 6.3  Hemoglobin  11.6 - 15.9 g/dL 11.8 12.3 11.2(L)  Hematocrit 34.8 - 46.6 % 36.3 37.8 34.0(L)  Platelets 145 - 400 10e3/uL 281 289 287    CMP Latest Ref Rng & Units 07/08/2014 08/17/2012 12/09/2011  Glucose 70 - 140 mg/dl 239(H) 139(H) 218(H)  BUN 7.0 - 26.0 mg/dL 13.4 16 5(L)  Creatinine 0.6 - 1.1 mg/dL 0.8 0.64 0.65  Sodium 136 - 145 mEq/L 138 138 135  Potassium 3.5 - 5.1 mEq/L 4.6 3.7 4.7  Chloride 96 - 112 mEq/L - 101 99  CO2 22 - 29 mEq/L 28 28 27   Calcium 8.4 - 10.4 mg/dL 8.8 9.2 8.6  Total Protein 6.4 - 8.3 g/dL 6.8 - 6.1  Total Bilirubin 0.20 - 1.20 mg/dL 0.20 - 0.1(L)  Alkaline Phos 40 - 150 U/L 63 - 62  AST 5 - 34 U/L 10 - 38(H)  ALT 0 - 55 U/L 13 - 17   Results for MIRAYA, HARTINGER (MRN [id number]) as of 04/02/2016 07:04  Ref. Range 01/01/2015 10:44 03/05/2015 10:12 04/14/2015 10:33 08/18/2015 10:03  Iron Latest Ref Range: 41 - 142 ug/dL 32 (L) 38 (L) 46 41  UIBC Latest Ref Range: 120 - 384 ug/dL 227 189 210 211  TIBC Latest Ref Range: 236 - 444 ug/dL 259 228 (L) 255 253  %SAT Latest Ref Range: 21 - 57 % 12 (L) 17 (L) 18 (L) 16 (L)  Ferritin Latest Ref Range: 9 - 269 ng/ml 218 196 192 137    RADIOGRAPHIC STUDIES: I have personally reviewed the radiological images as listed and agreed with the findings in the report. No results found.  ASSESSMENT & PLAN:  50 year old female with past medical history of hypertension, diabetes, obesity, and chronic anemia presents for evaluation of her anemia.  1. 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 -Iron study showed low serum iron, low saturation and low ferritin (17), consistent with iron deficient anemia. This is likely secondary to her heavy menstrual period. Her prior GI workup was negative. - I reviewed her other lab test results with her. She has normal 123456, folic acid levels,  SPEP negative, erythropoietin level elevated. -She responded to Feraheme, anemia resolved -She did not tolerate oral iron before. -We'll continue to monitor her CBC and iron studies every 3 months. -  today's are study result is still pending.   2. Hypertension, diabetes, obesity -She will continue follow-up with her  Endocrinologist for her hypertension and diabetes. She does not have PCP. - her blood pressure is slightly elevated today, I encouraged her to check blood pressure at home - we discussed weight loss  3. Fatigue, hot flush,  And insomnia -Possible perimenopause - I discussed sleep hygiene with her - I recommend her try Neurontin , starting 100 mg at bedtime, can be increased to 300 mg at bedtime, for her hot flashes and insomnia.  Benefit and the potential side effects we discussed with her, and her I sent a prescription to her pharmacy today.  She agrees to try  PLAN -- I'll call her with her iron study results. -Repeat CBC and iron study in 3 months, I'll see her back in 6 months. - I sent a new prescription of Neurontin to her pharmacy today for her hot flash and insomnia  All questions were answered. The patient knows to call the clinic with any problems, questions or concerns. I spent 20 minutes counseling the patient face to face. The total time spent in the appointment was 25 minutes and more than 50% was on counseling.     Truitt Merle, MD 04/02/2016   7:03 AM

## 2016-04-06 ENCOUNTER — Telehealth: Payer: Self-pay | Admitting: Hematology

## 2016-04-06 NOTE — Telephone Encounter (Signed)
lvm to inform pt of 10/11 appt per LOS

## 2016-04-21 ENCOUNTER — Encounter: Payer: Self-pay | Admitting: Hematology

## 2016-04-21 ENCOUNTER — Other Ambulatory Visit (HOSPITAL_BASED_OUTPATIENT_CLINIC_OR_DEPARTMENT_OTHER): Payer: Federal, State, Local not specified - PPO

## 2016-04-21 ENCOUNTER — Ambulatory Visit (HOSPITAL_BASED_OUTPATIENT_CLINIC_OR_DEPARTMENT_OTHER): Payer: Federal, State, Local not specified - PPO | Admitting: Hematology

## 2016-04-21 ENCOUNTER — Telehealth: Payer: Self-pay | Admitting: Hematology

## 2016-04-21 VITALS — BP 146/64 | HR 66 | Temp 98.8°F | Resp 18 | Ht 70.0 in | Wt 322.2 lb

## 2016-04-21 DIAGNOSIS — D500 Iron deficiency anemia secondary to blood loss (chronic): Secondary | ICD-10-CM | POA: Diagnosis not present

## 2016-04-21 DIAGNOSIS — I10 Essential (primary) hypertension: Secondary | ICD-10-CM

## 2016-04-21 DIAGNOSIS — D509 Iron deficiency anemia, unspecified: Secondary | ICD-10-CM

## 2016-04-21 DIAGNOSIS — E119 Type 2 diabetes mellitus without complications: Secondary | ICD-10-CM

## 2016-04-21 DIAGNOSIS — G4700 Insomnia, unspecified: Secondary | ICD-10-CM

## 2016-04-21 DIAGNOSIS — E669 Obesity, unspecified: Secondary | ICD-10-CM

## 2016-04-21 DIAGNOSIS — N920 Excessive and frequent menstruation with regular cycle: Secondary | ICD-10-CM | POA: Diagnosis not present

## 2016-04-21 DIAGNOSIS — R5383 Other fatigue: Secondary | ICD-10-CM

## 2016-04-21 DIAGNOSIS — N951 Menopausal and female climacteric states: Secondary | ICD-10-CM

## 2016-04-21 LAB — CBC & DIFF AND RETIC
BASO%: 0.8 % (ref 0.0–2.0)
BASOS ABS: 0.1 10*3/uL (ref 0.0–0.1)
EOS ABS: 0.2 10*3/uL (ref 0.0–0.5)
EOS%: 2.1 % (ref 0.0–7.0)
HEMATOCRIT: 35.8 % (ref 34.8–46.6)
HGB: 11.6 g/dL (ref 11.6–15.9)
Immature Retic Fract: 8.3 % (ref 1.60–10.00)
LYMPH#: 1.8 10*3/uL (ref 0.9–3.3)
LYMPH%: 22.8 % (ref 14.0–49.7)
MCH: 27.6 pg (ref 25.1–34.0)
MCHC: 32.4 g/dL (ref 31.5–36.0)
MCV: 85 fL (ref 79.5–101.0)
MONO#: 0.4 10*3/uL (ref 0.1–0.9)
MONO%: 5.6 % (ref 0.0–14.0)
NEUT#: 5.4 10*3/uL (ref 1.5–6.5)
NEUT%: 68.7 % (ref 38.4–76.8)
Platelets: 299 10*3/uL (ref 145–400)
RBC: 4.21 10*6/uL (ref 3.70–5.45)
RDW: 13.6 % (ref 11.2–14.5)
RETIC %: 1.44 % (ref 0.70–2.10)
RETIC CT ABS: 60.62 10*3/uL (ref 33.70–90.70)
WBC: 7.8 10*3/uL (ref 3.9–10.3)

## 2016-04-21 LAB — IRON AND TIBC
%SAT: 16 % — ABNORMAL LOW (ref 21–57)
IRON: 44 ug/dL (ref 41–142)
TIBC: 275 ug/dL (ref 236–444)
UIBC: 231 ug/dL (ref 120–384)

## 2016-04-21 LAB — FERRITIN: Ferritin: 100 ng/ml (ref 9–269)

## 2016-04-21 NOTE — Progress Notes (Signed)
[name]  [phone] [phone]  Clinic follow up Note   Patient Care Team: Doree Fudge, MD as PCP - General (Family Medicine) 04/21/2016  CHIEF COMPLAINTS:  Follow up anemia of iron deficiency   HISTORY OF PRESENTING ILLNESS:  Kristina Adams 50 y.o. female is here because of her chronic anemia.   She was found to be anemic about 15 years ago, our electronic medical records shows she has anemia at least back to December 2011, with hemoglobin in the range of 8-10.8, MCV in the high 70s to low 80s range. Her white count and platelet count are usually normal, with a few occasions with slightly elevated every BC and platelet count.   She has period every 28 days, except last month she had twice. It lasts 7 days, very heavy in the first two days, she changes every 3 hours.  She denied any bleeding episodes including hematochezia, melana, hemoptysis, hematuria or epitaxis. No mucosal bleeding or easy bruising.  She used to take oral iron but could not tolerated due to the constipation. She had EGD and EUS twice in 2009, which showed submucosal lesion close to the GE junction measuring 2-3 cm, stable. Biopsy was negative twice. Per patient, she had colonoscopy about 45 years ago which was unremarkable. I do not have the colonoscopy report available.  She denies recent chest pain on exertion, shortness of breath on minimal exertion, pre-syncopal episodes, or palpitations. She had not noticed any recent bleeding such as epistaxis, hematuria or hematochezia The patient denies over the counter NSAID ingestion. She is not on antiplatelets agents. Her last colonoscopy none  She had no prior history or diagnosis of cancer. Her age appropriate screening programs are up-to-date. She denies any pica and eats a variety of diet. She never donated blood or received blood transfusion   She works for [employer] full time. She is pretty active. She feels well  overall at except  mild fatigue. She denies any pain, nausea, cough, change of her bowel habits, or any recent episodes of fever chill, no recent weight loss.   TREATMENT: iv Feraheme as needed, received in 07/2014 and 09/2014   INTERIM HISTORY: [name] returns for follow up.  She is doing well overall. Her main complaint is fatigue, she is able to tolerate routine activities well, but feels exhausted by the end of the day. She has mild body aches and pain, especially low back. Her menstrual period is much lighter, it lasts only a few days, and slightly irregular, she has a once every 1-2 months.   MEDICAL HISTORY:  Past Medical History:  Diagnosis Date  . Anemia 12/09/2011   child birth  . Appendicitis, acute 12/03/2011  . Blood transfusion   . Chronic back pain 12/03/2011  . DDD (degenerative disc disease)   . Degenerative disc disease, cervical    low back area  . Diabetes mellitus   . Diabetes mellitus type 2, insulin dependent ([place]) 12/03/2011  . GERD (gastroesophageal reflux disease)   . H/O hiatal hernia   . Headache(784.0)   . Hypertension    sees Dr. Darron Doom, [facility], [place]  . Hypertension 12/03/2011  . Morbid obesity with BMI of 45.0-49.9, adult ([place]) 12/03/2011  . Sleep apnea 12/03/2011   last sleep study May 2013  . Thyroid disease   . Urinary tract bacterial infections    hx of    SURGICAL HISTORY: Past Surgical History:  Procedure Laterality Date  .  APPENDECTOMY    . CESAREAN SECTION    . LAPAROSCOPIC APPENDECTOMY  12/03/2011   Procedure: APPENDECTOMY LAPAROSCOPIC;  Surgeon: [name], DO;  Location: [facility];  Service: General;  Laterality: N/A;  drainage of intra-abdominal abscess   . PARS PLANA VITRECTOMY Left 08/17/2012   Procedure: PARS PLANA VITRECTOMY WITH 25 GAUGE;  Surgeon: [name], MD;  Location: [facility];  Service: Ophthalmology;  Laterality: Left;  . 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 TUBAL LIGATION    . VITRECTOMY      SOCIAL HISTORY: History   Social History  . Marital Status: Divorced    Spouse Name: N/A    Number of Children: 69 yo daughter and 74 yo  son  . Years of Education: N/A   Occupational History  . Not on file.   Social History Main Topics  . Smoking status: Never   . Smokeless tobacco: Never Used  . Alcohol Use: 0.6 oz/week    1 Glasses of wine per week  . Drug Use: No  . Sexual Activity: Yes    Birth Control/ Protection: Pill   Other Topics Concern  . Not on file   Social History Narrative  . No narrative on file    FAMILY HISTORY: Family History  Problem Relation Age of Onset  . Cancer Mother 23    breast cancer   . Diabetes Other   . Cancer Other   . Hypertension Other     ALLERGIES:  is allergic to latex and iohexol.  MEDICATIONS:  Current Outpatient Prescriptions  Medication Sig Dispense Refill  . cholecalciferol (VITAMIN D) 1000 UNITS tablet Take 2,000 Units by mouth daily.    . cyclobenzaprine (FLEXERIL) 10 MG tablet Take 10 mg by mouth 3 (three) times daily as needed.    . gabapentin (NEURONTIN) 100 MG capsule Take 1 capsule (100 mg total) by mouth at bedtime. 30 capsule 1  . glyBURIDE (DIABETA) 5 MG tablet Take 5 mg by mouth daily.    . hydrochlorothiazide (HYDRODIURIL) 25 MG tablet Take 25 mg by mouth daily.    . Insulin Glargine (LANTUS) 100 UNIT/ML Solostar Pen Inject 80 Units into the skin daily.    Marland Kitchen lisinopril (PRINIVIL,ZESTRIL) 40 MG tablet Take 40 mg by mouth daily.    . meloxicam (MOBIC) 15 MG tablet Take 15 mg by mouth daily.    . methimazole (TAPAZOLE) 5 MG tablet Take 10 mg by mouth daily.    . Multiple Vitamin (MULITIVITAMIN WITH MINERALS) TABS Take 1 tablet by mouth daily.    . vitamin C (ASCORBIC ACID) 500 MG tablet Take 1,000 mg by mouth daily.     No current  facility-administered medications for this visit.     REVIEW OF SYSTEMS:   Constitutional: Denies fevers, chills or abnormal night sweats Eyes: Denies blurriness of vision, double vision or watery eyes Ears, nose, mouth, throat, and face: Denies mucositis or sore throat Respiratory: Denies cough, dyspnea or wheezes Cardiovascular: Denies palpitation, chest discomfort or lower extremity swelling Gastrointestinal:  Denies nausea, heartburn or change in bowel habits Skin: Denies abnormal skin rashes Lymphatics: Denies new lymphadenopathy or easy bruising Neurological:Denies numbness, tingling or new weaknesses Behavioral/Psych: Mood is stable, no new changes  All other systems were reviewed with the patient and are negative.  PHYSICAL EXAMINATION: ECOG PERFORMANCE STATUS:1  Vitals:   04/21/16 1148  BP: (!) 146/64  Pulse: 66  Resp: 18  Temp: 98.8 F (37.1 C)   Filed Weights   04/21/16 1148  Weight: (!) 322 lb 3.2 oz (146.1 kg)    GENERAL:alert, no distress and comfortable SKIN: skin color, texture, turgor are normal, no rashes or significant lesions EYES: normal, conjunctiva are pink and non-injected, sclera clear OROPHARYNX:no exudate, no erythema and lips, buccal mucosa, and tongue normal  NECK: supple, thyroid normal size, non-tender, without nodularity LYMPH:  no palpable lymphadenopathy in the cervical, axillary or inguinal LUNGS: clear to auscultation and percussion with normal breathing effort HEART: regular rate & rhythm and no murmurs and no lower extremity edema ABDOMEN:abdomen soft, non-tender and normal bowel sounds Musculoskeletal:no cyanosis of digits and no clubbing  PSYCH: alert & oriented x 3 with fluent speech NEURO: no focal motor/sensory deficits  LABORATORY DATA:  I have reviewed the data as listed CBC Latest Ref Rng & Units 04/21/2016 08/18/2015 04/14/2015  WBC 3.9 - 10.3 10e3/uL 7.8 6.3 6.1  Hemoglobin 11.6 - 15.9 g/dL 11.6 11.8 12.3  Hematocrit 34.8  - 46.6 % 35.8 36.3 37.8  Platelets 145 - 400 10e3/uL 299 281 289    CMP Latest Ref Rng & Units 07/08/2014 08/17/2012 12/09/2011  Glucose 70 - 140 mg/dl 239(H) 139(H) 218(H)  BUN 7.0 - 26.0 mg/dL 13.4 16 5(L)  Creatinine 0.6 - 1.1 mg/dL 0.8 0.64 0.65  Sodium 136 - 145 mEq/L 138 138 135  Potassium 3.5 - 5.1 mEq/L 4.6 3.7 4.7  Chloride 96 - 112 mEq/L - 101 99  CO2 22 - 29 mEq/L 28 28 27   Calcium 8.4 - 10.4 mg/dL 8.8 9.2 8.6  Total Protein 6.4 - 8.3 g/dL 6.8 - 6.1  Total Bilirubin 0.20 - 1.20 mg/dL 0.20 - 0.1(L)  Alkaline Phos 40 - 150 U/L 63 - 62  AST 5 - 34 U/L 10 - 38(H)  ALT 0 - 55 U/L 13 - 17   Results for BEATA, SCHMIDBAUER (MRN [id number]) as of 04/25/2016 15:26  Ref. Range 08/18/2015 10:03 04/21/2016 11:20  Iron Latest Ref Range: 41 - 142 ug/dL 41 44  UIBC Latest Ref Range: 120 - 384 ug/dL 211 231  TIBC Latest Ref Range: 236 - 444 ug/dL 253 275  %SAT Latest Ref Range: 21 - 57 % 16 (L) 16 (L)  Ferritin Latest Ref Range: 9 - 269 ng/ml 137 100     RADIOGRAPHIC STUDIES: I have personally reviewed the radiological images as listed and agreed with the findings in the report. No results found.  ASSESSMENT & PLAN:  50 year old female with past medical history of hypertension, diabetes, obesity, and chronic anemia presents for evaluation of her anemia.  1. 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 -Iron study showed low serum iron, low saturation and low ferritin (17), consistent with iron deficient anemia. This is likely secondary to her heavy menstrual period. Her prior GI workup was negative. - I reviewed her other lab test results with her. She has normal [id number], folic acid levels, SPEP negative, erythropoietin level elevated. -She responded to Feraheme, anemia resolved -She did not tolerate oral iron before. -her iron  level has been normal in the past one year, Due to her much lighter menstrual period lately  -Lab results reviewed with her, hemoglobin 11.6, slightly lower than before.  Ferritin 100, serum iron level normal, transferrin saturation slightly low at 16%. -given her fatigue and mild anemia, I will give her one dose Feraheme  2. Hypertension, diabetes, obesity -She will continue follow-up with her  Endocrinologist for her hypertension and diabetes. She does not have PCP. - her blood pressure is slightly elevated today, I encouraged her to check blood pressure at home - we discussed weight loss  3. Fatigue, hot flush,  And insomnia -Possible perimenopause - I discussed sleep hygiene with her - I recommend her try Neurontin , starting 100 mg at bedtime, can be increased to 300 mg at bedtime, for her hot flashes and insomnia.  Benefit and the potential side effects we discussed with her, and her I sent a prescription to her pharmacy today.  She agrees to try  PLAN -one dose Feraheme next week -lab in 6 months  -She will follow-up with her primary care physician with lab in the interim  -I'll plan to see her back in 1 year with repeat lab.  All questions were answered. The patient knows to call the clinic with any problems, questions or concerns. I spent 15 minutes counseling the patient face to face. The total time spent in the appointment was 20 minutes and more than 50% was on counseling.     Truitt Merle, MD 04/21/2016   7:08 AM

## 2016-04-21 NOTE — Telephone Encounter (Signed)
Avs report and appointment schedule given to patient, per 04/21/16 los. ° °

## 2016-04-25 ENCOUNTER — Encounter: Payer: Self-pay | Admitting: Hematology

## 2016-04-29 ENCOUNTER — Telehealth: Payer: Self-pay | Admitting: Hematology

## 2016-04-29 NOTE — Telephone Encounter (Signed)
lvm to inform pt of 10/24 appt per LOS

## 2016-05-04 ENCOUNTER — Ambulatory Visit (HOSPITAL_BASED_OUTPATIENT_CLINIC_OR_DEPARTMENT_OTHER): Payer: Federal, State, Local not specified - PPO

## 2016-05-04 ENCOUNTER — Other Ambulatory Visit (HOSPITAL_BASED_OUTPATIENT_CLINIC_OR_DEPARTMENT_OTHER): Payer: Federal, State, Local not specified - PPO

## 2016-05-04 VITALS — BP 134/64 | HR 57 | Temp 98.6°F | Resp 18

## 2016-05-04 DIAGNOSIS — D500 Iron deficiency anemia secondary to blood loss (chronic): Secondary | ICD-10-CM

## 2016-05-04 DIAGNOSIS — N920 Excessive and frequent menstruation with regular cycle: Secondary | ICD-10-CM

## 2016-05-04 DIAGNOSIS — D509 Iron deficiency anemia, unspecified: Secondary | ICD-10-CM

## 2016-05-04 DIAGNOSIS — D508 Other iron deficiency anemias: Secondary | ICD-10-CM

## 2016-05-04 LAB — CBC & DIFF AND RETIC
BASO%: 1.2 % (ref 0.0–2.0)
Basophils Absolute: 0.1 10*3/uL (ref 0.0–0.1)
EOS ABS: 0.1 10*3/uL (ref 0.0–0.5)
EOS%: 1.7 % (ref 0.0–7.0)
HCT: 33.6 % — ABNORMAL LOW (ref 34.8–46.6)
HEMOGLOBIN: 10.9 g/dL — AB (ref 11.6–15.9)
Immature Retic Fract: 14 % — ABNORMAL HIGH (ref 1.60–10.00)
LYMPH%: 18 % (ref 14.0–49.7)
MCH: 27.6 pg (ref 25.1–34.0)
MCHC: 32.4 g/dL (ref 31.5–36.0)
MCV: 85.1 fL (ref 79.5–101.0)
MONO#: 0.4 10*3/uL (ref 0.1–0.9)
MONO%: 7.1 % (ref 0.0–14.0)
NEUT%: 72 % (ref 38.4–76.8)
NEUTROS ABS: 4.4 10*3/uL (ref 1.5–6.5)
Platelets: 291 10*3/uL (ref 145–400)
RBC: 3.95 10*6/uL (ref 3.70–5.45)
RDW: 13.3 % (ref 11.2–14.5)
RETIC %: 1.91 % (ref 0.70–2.10)
Retic Ct Abs: 75.45 10*3/uL (ref 33.70–90.70)
WBC: 6.1 10*3/uL (ref 3.9–10.3)
lymph#: 1.1 10*3/uL (ref 0.9–3.3)

## 2016-05-04 LAB — FERRITIN: FERRITIN: 65 ng/mL (ref 9–269)

## 2016-05-04 LAB — IRON AND TIBC
%SAT: 10 % — AB (ref 21–57)
Iron: 26 ug/dL — ABNORMAL LOW (ref 41–142)
TIBC: 245 ug/dL (ref 236–444)
UIBC: 220 ug/dL (ref 120–384)

## 2016-05-04 MED ORDER — FERUMOXYTOL INJECTION 510 MG/17 ML
510.0000 mg | Freq: Once | INTRAVENOUS | Status: AC
  Administered 2016-05-04: 510 mg via INTRAVENOUS
  Filled 2016-05-04: qty 17

## 2016-05-04 NOTE — Patient Instructions (Signed)

## 2016-05-12 ENCOUNTER — Telehealth (INDEPENDENT_AMBULATORY_CARE_PROVIDER_SITE_OTHER): Payer: Self-pay | Admitting: Physical Medicine and Rehabilitation

## 2016-05-12 ENCOUNTER — Encounter (INDEPENDENT_AMBULATORY_CARE_PROVIDER_SITE_OTHER): Payer: Self-pay | Admitting: Specialist

## 2016-05-12 ENCOUNTER — Ambulatory Visit (INDEPENDENT_AMBULATORY_CARE_PROVIDER_SITE_OTHER): Payer: Federal, State, Local not specified - PPO

## 2016-05-12 ENCOUNTER — Ambulatory Visit (INDEPENDENT_AMBULATORY_CARE_PROVIDER_SITE_OTHER): Payer: Federal, State, Local not specified - PPO | Admitting: Specialist

## 2016-05-12 VITALS — BP 184/77 | HR 72 | Ht 70.0 in | Wt 324.0 lb

## 2016-05-12 DIAGNOSIS — G8929 Other chronic pain: Secondary | ICD-10-CM | POA: Diagnosis not present

## 2016-05-12 DIAGNOSIS — M4316 Spondylolisthesis, lumbar region: Secondary | ICD-10-CM | POA: Diagnosis not present

## 2016-05-12 DIAGNOSIS — M4726 Other spondylosis with radiculopathy, lumbar region: Secondary | ICD-10-CM

## 2016-05-12 DIAGNOSIS — M5441 Lumbago with sciatica, right side: Secondary | ICD-10-CM

## 2016-05-12 DIAGNOSIS — M25551 Pain in right hip: Secondary | ICD-10-CM

## 2016-05-12 DIAGNOSIS — M1711 Unilateral primary osteoarthritis, right knee: Secondary | ICD-10-CM

## 2016-05-12 MED ORDER — DICLOFENAC SODIUM 50 MG PO TBEC: 50.0000 mg | DELAYED_RELEASE_TABLET | Freq: Every day | ORAL | 6 refills | Status: DC

## 2016-05-12 NOTE — Progress Notes (Signed)
Office Visit Note   Patient: Kristina Adams           Date of Birth: 1966-06-23           MRN: [id number] Visit Date: 05/12/2016              Requested by: [place] [id number] [facility] [place], [place] [address] PCP: Rachell Cipro, MD   Assessment & Plan: Visit Diagnoses:  1. Chronic midline low back pain with right-sided sciatica   2. Other spondylosis with radiculopathy, lumbar region   3. Spondylolisthesis, lumbar region   4. Unilateral primary osteoarthritis, right knee   5. Pain in right hip     Plan: Avoid frequent bending and stooping. No lifting greater than 15 lbs. Cane for knee or hip pain, opposite side cane for hip and same side for knee depending On which is most painful. We will call and arrange for facet injections to be repeated at the L4-5 and L5-S1 levels bilaterally. Hot showers in the AM and ice at then end of the day. Diclofenac is for arthritis pain in back and hip and knee. Low aerobic exercise, walking, walking in a pool or floor exercises for back.    Follow-Up Instructions: No Follow-up on file.   Orders:  Orders Placed This Encounter  Procedures  . XR Lumbar Spine 2-3 Views   No orders of the defined types were placed in this encounter.     Procedures: No procedures performed   Clinical Data: No additional findings.   Subjective: Chief Complaint  Patient presents with  . Lower Back - Follow-up    Patient returns today as 3 month recheck of low back pain. States she is having trouble with low back and has been unable to work the entire month of October. Pain is constant no matter what she does. Pain radiates into right side down into knee. Having a lot of cramps at night, hard to get comfortable. Patient has had injection previously with Dr. Ernestina Patches states it did provide relief, but only for about 4-6 weeks.  Pain feels like it is getting worse, was out of work the month of October. She is  taking the medications. Considering disability for her condition 2 level spondylolisthesis, with spondylosis pain    Review of Systems  HENT: Negative.   Eyes: Negative.   Respiratory: Positive for wheezing.   Cardiovascular: Positive for leg swelling. Negative for chest pain.  Gastrointestinal: Negative.   Genitourinary: Negative.   Musculoskeletal: Positive for arthralgias, back pain, gait problem and joint swelling.  Skin: Negative.   Allergic/Immunologic: Negative for environmental allergies, food allergies and immunocompromised state.  Neurological: Positive for weakness and numbness.  Hematological: Negative.   Psychiatric/Behavioral: Negative.      Objective: Vital Signs: BP (!) 184/77   Pulse 72   Ht 5\' 10"  (1.778 m)   Wt (!) 324 lb (147 kg)   BMI 46.49 kg/m   Physical Exam  Constitutional: She appears well-developed and well-nourished.  HENT:  Head: Normocephalic and atraumatic.  Eyes: EOM are normal. Pupils are equal, round, and reactive to light.  Neck: Normal range of motion. Neck supple.  Pulmonary/Chest: Effort normal. She has wheezes.  Musculoskeletal: She exhibits tenderness.  Neurological: She displays normal reflexes. She exhibits normal muscle tone.  Skin: Skin is warm and dry.  Psychiatric: She has a normal mood and affect. Her behavior is normal. Judgment and thought content normal.    Right Knee Exam  Range of Motion  Extension: abnormal  Flexion: abnormal   Comments:  Valgus right knee, ROM full extension, flexion 130 degrees, mild grating P-F joint positive grind test right knee.   Right Hip Exam  Right hip exam is normal.   Tenderness  The patient is experiencing tenderness in the greater trochanter.  Range of Motion  Extension: normal  Flexion: normal  Internal Rotation: normal  External Rotation: normal  Abduction: normal  Adduction: normal   Comments:  Pain with ROM right hip, mild, non antalgic gait, no gluteal  lurch.   Back Exam   Tenderness  The patient is experiencing tenderness in the lumbar.  Range of Motion  Extension: normal  Flexion: normal  Lateral Bend Right: normal  Lateral Bend Left: normal  Rotation Right: normal  Rotation Left: normal   Muscle Strength  Right Quadriceps:  5/5  Left Quadriceps:  5/5  Right Hamstrings:  5/5  Left Hamstrings:  5/5   Tests  Straight leg raise right: negative Straight leg raise left: negative  Reflexes  Patellar:  2/4 abnormal Achilles:  2/4 abnormal Babinski's sign: abnormal   Other  Toe Walk: normal Heel Walk: normal Sensation: normal Gait: normal  Erythema: no back redness Scars: absent      Specialty Comments:  No specialty comments available.  Imaging: Xr Lumbar Spine 2-3 Views  Result Date: 05/12/2016 Radiographs two view demonstrate spondylolisthesis with spondylosis, the lateral radiograph demonstrates a Grade 1 spondylolisthesis at the L4-5 level, severe facet degenerative changes of hypertrophy and sclerosis at the L4-5 and L5-S1 levels. Grade 1 spill at the S1-2 level, this level appears to be non mobile.     PMFS History: Patient Active Problem List   Diagnosis Date Noted  . 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 Iron deficiency anemia 12/09/2011  . Sleep apnea 12/03/2011  . Morbid obesity with BMI of 45.0-49.9, adult ([place]) 12/03/2011  . Chronic back pain 12/03/2011  . Hypertension 12/03/2011  . Depression 12/03/2011  . Appendicitis, acute 12/03/2011  . Diabetes mellitus type 2, insulin dependent ([place]) 12/03/2011  . HYPOGLYCEMIA 08/22/2010  . ALLERGIC RHINITIS CAUSE UNSPECIFIED 08/22/2010  . HYPERTHYROIDISM 06/27/2010  . DIABETES MELLITUS, TYPE II, UNCONTROLLED 06/27/2010  . DYSLIPIDEMIA 06/27/2010  . MORBID OBESITY 06/27/2010  . UNSPECIFIED ESSENTIAL HYPERTENSION 06/27/2010  . PERS HX NONCOMPLIANCE W/MED TX PRS HAZARDS HLTH 06/27/2010  . NONSPECIFIC ABN FINDING RAD & OTH EXAM GI TRACT  02/26/2009  . NEOPLASM UNCERTAIN BHV OTH&UNSPEC DIGESTIVE ORGN 02/26/2008   Past Medical History:  Diagnosis Date  . Anemia 12/09/2011   child birth  . Appendicitis, acute 12/03/2011  . Blood transfusion   . Chronic back pain 12/03/2011  . DDD (degenerative disc disease)   . Degenerative disc disease, cervical    low back area  . Diabetes mellitus   . Diabetes mellitus type 2, insulin dependent ([place]) 12/03/2011  . GERD (gastroesophageal reflux disease)   . H/O hiatal hernia   . Headache(784.0)   . Hypertension    sees Dr. Darron Doom, [facility], [place]  . Hypertension 12/03/2011  . Morbid obesity with BMI of 45.0-49.9, adult ([place]) 12/03/2011  . Sleep apnea 12/03/2011   last sleep study May 2013  . Thyroid disease   . Urinary tract bacterial infections    hx of    Family History  Problem Relation Age of Onset  . Cancer Mother 25    breast cancer   . Diabetes Other   . Cancer Other   .  Hypertension Other     Past Surgical History:  Procedure Laterality Date  . APPENDECTOMY    . CESAREAN SECTION    . LAPAROSCOPIC APPENDECTOMY  12/03/2011   Procedure: APPENDECTOMY LAPAROSCOPIC;  Surgeon: [name], DO;  Location: [facility];  Service: General;  Laterality: N/A;  drainage of intra-abdominal abscess   . PARS PLANA VITRECTOMY Left 08/17/2012   Procedure: PARS PLANA VITRECTOMY WITH 25 GAUGE;  Surgeon: [name], MD;  Location: [facility];  Service: Ophthalmology;  Laterality: Left;  . 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 TUBAL LIGATION    . VITRECTOMY     Social History   Occupational History  . Not on file.   Social History Main Topics  . Smoking status: Never Smoker  . Smokeless tobacco: Never Used  . Alcohol use 0.6 oz/week    1 Glasses of wine per week  . Drug use: No  . Sexual  activity: Yes    Birth control/ protection: Pill

## 2016-05-12 NOTE — Patient Instructions (Signed)
Avoid frequent bending and stooping. No lifting greater than 15 lbs. Cane for knee or hip pain, opposite side cane for hip and same side for knee depending On which is most painful. We will call and arrange for facet injections to be repeated at the L4-5 and L5-S1 levels bilaterally. Hot showers in the AM and ice at then end of the day. Diclofenac is for arthritis pain in back and hip and knee. Low aerobic exercise, walking, walking in a pool or floor exercises for back.

## 2016-05-12 NOTE — Telephone Encounter (Signed)
Per [employer] website pt has active coverage. Effective 07/13/15. No precert required.

## 2016-05-18 ENCOUNTER — Ambulatory Visit (INDEPENDENT_AMBULATORY_CARE_PROVIDER_SITE_OTHER): Payer: Federal, State, Local not specified - PPO | Admitting: Physical Medicine and Rehabilitation

## 2016-05-18 ENCOUNTER — Encounter (INDEPENDENT_AMBULATORY_CARE_PROVIDER_SITE_OTHER): Payer: Self-pay | Admitting: Physical Medicine and Rehabilitation

## 2016-05-18 VITALS — BP 143/68 | HR 68 | Temp 98.4°F

## 2016-05-18 DIAGNOSIS — G8929 Other chronic pain: Secondary | ICD-10-CM | POA: Diagnosis not present

## 2016-05-18 DIAGNOSIS — M545 Low back pain: Secondary | ICD-10-CM | POA: Diagnosis not present

## 2016-05-18 DIAGNOSIS — M47816 Spondylosis without myelopathy or radiculopathy, lumbar region: Secondary | ICD-10-CM

## 2016-05-18 MED ORDER — LIDOCAINE HCL (PF) 1 % IJ SOLN
0.3300 mL | Freq: Once | INTRAMUSCULAR | Status: AC
  Administered 2016-05-19: 0.3 mL

## 2016-05-18 MED ORDER — METHYLPREDNISOLONE ACETATE 80 MG/ML IJ SUSP
80.0000 mg | Freq: Once | INTRAMUSCULAR | Status: AC
  Administered 2016-05-19: 80 mg

## 2016-05-18 NOTE — Progress Notes (Signed)
KUULEI ADY - 50 y.o. female MRN [id number]  Date of birth: [birth year]  Office Visit Note: Visit Date: 05/18/2016 PCP: Rachell Cipro, MD Referred by: Fanny Bien, MD  Subjective: Chief Complaint  Patient presents with  . Lower Back - Pain   HPI: Mrs. Kano is a less than 50 year old female who is morbidly obese with chronic axial low back pain. We have completed 1 set of facet joint blocks at L4-5 and L5-S1 with really good relief or than 70% relief and this is documented in the old chart. She is followed by Dr. Louanne Skye. She comes in today with worsening axial pain across the low back with standing and twisting. No radicular pain. Her case is complicated by diabetes. She has imaging showing facet arthropathy. She does not have any radiculopathy or stenosis. We are went to completed a second diagnostic block if she does well with that we'll regroup with physical therapy and then look at radiofrequency ablation. She would be a hard case to do radiofrequency ablation given the size and body habitus. Pain across lower back. Right = Left. Starting to have pain in right hip and groin x 3-4 weeks. Seems to be worse laying down. Tingling at night in feet but thinks that is coming from diabetes.  Pt is on no blood thinners and has no dye allergy [place]ra    ROS Otherwise per HPI.  Assessment & Plan: Visit Diagnoses:  1. Spondylosis without myelopathy or radiculopathy, lumbar region   2. Chronic bilateral low back pain without sciatica     Plan: Findings:  We are going to complet a second diagnostic bilateral L4-5 and L5-S1 facet joint block,  if she does well with that we'll regroup with physical therapy and then look at radiofrequency ablation. She would be a hard case to do radiofrequency ablation given the size and body habitus.    Meds & Orders:  Meds ordered this encounter  Medications  . lidocaine (PF) (XYLOCAINE) 1 % injection 0.3 mL  . methylPREDNISolone acetate  (DEPO-MEDROL) injection 80 mg    Orders Placed This Encounter  Procedures  . Nerve Block    Follow-up: Return if symptoms worsen or fail to improve.   Procedures: No procedures performed  Lumbar Facet Joint Intra-Articular Injection(s) with Fluoroscopic Guidance  Patient: TAYLON MUSTAPHA      Date of Birth: October 25, 1965 MRN: [id number] PCP: Rachell Cipro, MD      Visit Date: 05/18/2016   Universal Protocol:    Date/Time: 11/07/174:12 PM  Consent Given By: the patient  Position: PRONE   Additional Comments: Vital signs were monitored before and after the procedure. Patient was prepped and draped in the usual sterile fashion. The correct patient, procedure, and site was verified.   Injection Procedure Details:  Procedure Site One Meds Administered:  Meds ordered this encounter  Medications  . lidocaine (PF) (XYLOCAINE) 1 % injection 0.3 mL  . methylPREDNISolone acetate (DEPO-MEDROL) injection 80 mg     Laterality: Bilateral  Location/Site:  L4-L5 L5-S1  Needle size: 22 guage  Needle type: Spinal  Needle Placement: Articular  Findings:  -Contrast Used: 2 mL iohexol 180 mg iodine/mL   -Comments: Excellent flow of contrast producing a partial arthrogram.  Procedure Details: The fluoroscope beam is vertically oriented in AP, and the inferior recess is visualized beneath the lower pole of the inferior apophyseal process, which represents the target point for needle insertion. When direct visualization is difficult the target point is located at the medial projection of  the vertebral pedicle. The region overlying each aforementioned target is locally anesthetized with a 1 to 2 ml. volume of 1% Lidocaine without Epinephrine.   The spinal needle was inserted into each of the above mentioned facet joints using biplanar fluoroscopic guidance. A 0.25 to 0.5 ml. volume of Isovue-250 was injected and a partial facet joint arthrogram was obtained. A single spot film was  obtained of the resulting arthrogram.    One to 1.25 ml of the steroid/anesthetic solution was then injected into each of the facet joints noted above.   Additional Comments:  The patient tolerated the procedure well Dressing: Band-Aid    Post-procedure details: Patient was observed during the procedure. Post-procedure instructions were reviewed.  Patient left the clinic in stable condition.        Clinical History: No specialty comments available.  She reports that she has never smoked. She has never used smokeless tobacco. No results for input(s): HGBA1C, LABURIC in the last 8760 hours.  Objective:  VS:  HT:    WT:   BMI:     BP:(!) 143/68  HR:68bpm  TEMP:98.4 F (36.9 C)( )  RESP:99 % Physical Exam  Musculoskeletal:  Pain with extension rotation of the lumbar spine concordant with her low back pain.    Ortho Exam Imaging: No results found.  Past Medical/Family/Surgical/Social History: Medications & Allergies reviewed per EMR Patient Active Problem List   Diagnosis Date Noted  . Benign essential hypertension 11/05/2015  . Gastroesophageal reflux disease 11/05/2015  . Uncontrolled type 2 diabetes mellitus with diabetic polyneuropathy, with long-term current use of insulin ([place]) 11/05/2015  . Multinodular goiter 11/06/2013  . Obesity, Class III, BMI 40-49.9 (morbid obesity) ([place]) 05/23/2013  . 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 Diabetic neuropathy ([place]) 12/13/2011  . Iron deficiency anemia 12/09/2011  . Sleep apnea 12/03/2011  . Morbid obesity with BMI of 45.0-49.9, adult ([place]) 12/03/2011  . Chronic back pain 12/03/2011  . Hypertension 12/03/2011  . Depression 12/03/2011  . Appendicitis, acute 12/03/2011  . Diabetes mellitus type 2, insulin dependent ([place]) 12/03/2011  . DM (diabetes mellitus) ([place]) 10/06/2011  . HYPOGLYCEMIA 08/22/2010  . ALLERGIC RHINITIS CAUSE UNSPECIFIED 08/22/2010  . HYPERTHYROIDISM 06/27/2010  . DIABETES MELLITUS, TYPE II,  UNCONTROLLED 06/27/2010  . DYSLIPIDEMIA 06/27/2010  . MORBID OBESITY 06/27/2010  . UNSPECIFIED ESSENTIAL HYPERTENSION 06/27/2010  . PERS HX NONCOMPLIANCE W/MED TX PRS HAZARDS HLTH 06/27/2010  . NONSPECIFIC ABN FINDING RAD & OTH EXAM GI TRACT 02/26/2009  . NEOPLASM UNCERTAIN BHV OTH&UNSPEC DIGESTIVE ORGN 02/26/2008   Past Medical History:  Diagnosis Date  . Anemia 12/09/2011   child birth  . Appendicitis, acute 12/03/2011  . Blood transfusion   . Chronic back pain 12/03/2011  . DDD (degenerative disc disease)   . Degenerative disc disease, cervical    low back area  . Diabetes mellitus   . Diabetes mellitus type 2, insulin dependent ([place]) 12/03/2011  . GERD (gastroesophageal reflux disease)   . H/O hiatal hernia   . Headache(784.0)   . Hypertension    sees Dr. Darron Doom, [facility], [place]  . Hypertension 12/03/2011  . Morbid obesity with BMI of 45.0-49.9, adult ([place]) 12/03/2011  . Sleep apnea 12/03/2011   last sleep study May 2013  . Thyroid disease   . Urinary tract bacterial infections    hx of   Family History  Problem Relation Age of Onset  . Cancer Mother 61    breast cancer   . Diabetes Other   .  Cancer Other   . Hypertension Other    Past Surgical History:  Procedure Laterality Date  . APPENDECTOMY    . CESAREAN SECTION    . LAPAROSCOPIC APPENDECTOMY  12/03/2011   Procedure: APPENDECTOMY LAPAROSCOPIC;  Surgeon: [name], DO;  Location: [facility];  Service: General;  Laterality: N/A;  drainage of intra-abdominal abscess   . PARS PLANA VITRECTOMY Left 08/17/2012   Procedure: PARS PLANA VITRECTOMY WITH 25 GAUGE;  Surgeon: [name], MD;  Location: [facility];  Service: Ophthalmology;  Laterality: Left;  . 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   Procedure: REPAIR OF COMPLEX TRACTION RETINAL DETACHMENT;  Surgeon: [name], MD;  Location: [facility];  Service: Ophthalmology;  Laterality: Left;  . TUBAL LIGATION    . VITRECTOMY       Social History   Occupational History  . Not on file.   Social History Main Topics  . Smoking status: Never Smoker  . Smokeless tobacco: Never Used  . Alcohol use 0.6 oz/week    1 Glasses of wine per week  . Drug use: No  . Sexual activity: Yes    Birth control/ protection: Pill

## 2016-05-18 NOTE — Patient Instructions (Signed)
[employer] Discharge Instructions  *At any time if you have questions or concerns they can be answered by calling [phone]  All Patients: . You may experience an increase in your symptoms for the first 2 days (it can take 2 days to 2 weeks for the steroid/cortisone to have its maximal effect). . You may use ice to the site for the first 24 hours; 20 minutes on and 20 minutes off and may use heat after that time. . You may resume and continue your current pain medications. If you need a refill please contact the prescribing physician. . You may resume her regular medications if any were stopped for the procedure. . You may shower but no swimming, tub bath or Jacuzzi for 24 hours. . Please remove bandage after 4 hours. . You may resume light activities as tolerated. . You should not drive for the next 3 hours due to anesthetics used in the procedure. Please have someone drive for you.  *If you have had sedation, Valium, Xanax, or lorazepam: Do not drive or use public transportation for 24 hours, do not operating hazardous machinery or make important personal/business decisions for 24 hours.  POSSIBLE STEROID SIDE EFFECTS: If experienced these should only last for a short period. Change in menstrual flow  Edema in (swelling)  Increased appetite Skin flushing (redness)  Skin rash/acne  Thrush (oral) Vaginitis    Increased sweating  Depression Increased blood glucose levels Cramping and leg/calf  Euphoria (feeling happy)  POSSIBLE PROCEDURE SIDE EFFECTS: Please call our office if concerned. Increased pain Increased numbness/tingling  Headache Nausea/vomiting Hematoma (bruising/bleeding) Edema (swelling at the site) Weakness  Infection (red/drainage at site) Fever greater than 100.53F  *In the event of a headache after epidural steroid injection: Drink plenty of fluids, especially water and try to lay flat when possible. If the headache does not get better after a few  days or as always if concerned please call the office. Radiofrequency Lesioning Radiofrequency lesioning is a procedure that is performed to relieve pain. The procedure is often used for back, neck, or arm pain. Radiofrequency lesioning involves the use of a machine that creates radio waves to make heat. During the procedure, the heat is applied to the nerve that carries the pain signal. The heat damages the nerve and interferes with the pain signal. Pain relief usually lasts for 6 months to 1 year. LET [facility] CARE PROVIDER KNOW ABOUT:  Any allergies you have.  All medicines you are taking, including vitamins, herbs, eye drops, creams, and over-the-counter medicines.  Previous problems you or members of your family have had with the use of anesthetics.  Any blood disorders you have.  Previous surgeries you have had.  Any medical conditions you have.  Whether you are pregnant or may be pregnant. RISKS AND COMPLICATIONS Generally, this is a safe procedure. However, problems may occur, including:  Pain or soreness at the injection site.  Infection at the injection site.  Damage to nerves or blood vessels. BEFORE THE PROCEDURE  Ask your health care provider about:  Changing or stopping your regular medicines. This is especially important if you are taking diabetes medicines or blood thinners.  Taking medicines such as aspirin and ibuprofen. These medicines can thin your blood. Do not take these medicines before your procedure if your health care provider instructs you not to.  Follow instructions from your health care provider about eating or drinking restrictions.  Plan to have someone take you home after the procedure.  If you go home right after the procedure, plan to have someone with you for 24 hours. PROCEDURE  You will be given one or more of the following:  A medicine to help you relax (sedative).  A medicine to numb the area (local anesthetic).  You will be  awake during the procedure. You will need to be able to talk with the health care provider during the procedure.  With the help of a type of X-ray (fluoroscopy), the health care provider will insert a radiofrequency needle into the area to be treated.  Next, a wire that carries the radio waves (electrode) will be put through the radiofrequency needle. An electrical pulse will be sent through the electrode to verify the correct nerve. You will feel a tingling sensation, and you may have muscle twitching.  Then, the tissue that is around the needle tip will be heated by an electric current that is passed using the radiofrequency machine. This will numb the nerves.  A bandage (dressing) will be put on the insertion area after the procedure is done. The procedure may vary among health care providers and hospitals. AFTER THE PROCEDURE  Your blood pressure, heart rate, breathing rate, and blood oxygen level will be monitored often until the medicines you were given have worn off.  Return to your normal activities as directed by your health care provider.   This information is not intended to replace advice given to you by your health care provider. Make sure you discuss any questions you have with your health care provider.   Document Released: 02/24/2011 Document Revised: 03/19/2015 Document Reviewed: 08/05/2014 Elsevier Interactive Patient Education [employer].

## 2016-05-18 NOTE — Procedures (Signed)
Lumbar Facet Joint Intra-Articular Injection(s) with Fluoroscopic Guidance  Patient: Kristina Adams      Date of Birth: March 18, 1966 MRN: [id number] PCP: Rachell Cipro, MD      Visit Date: 05/18/2016   Universal Protocol:    Date/Time: 11/07/174:12 PM  Consent Given By: the patient  Position: PRONE   Additional Comments: Vital signs were monitored before and after the procedure. Patient was prepped and draped in the usual sterile fashion. The correct patient, procedure, and site was verified.   Injection Procedure Details:  Procedure Site One Meds Administered:  Meds ordered this encounter  Medications  . lidocaine (PF) (XYLOCAINE) 1 % injection 0.3 mL  . methylPREDNISolone acetate (DEPO-MEDROL) injection 80 mg     Laterality: Bilateral  Location/Site:  L4-L5 L5-S1  Needle size: 22 guage  Needle type: Spinal  Needle Placement: Articular  Findings:  -Contrast Used: 2 mL iohexol 180 mg iodine/mL   -Comments: Excellent flow of contrast producing a partial arthrogram.  Procedure Details: The fluoroscope beam is vertically oriented in AP, and the inferior recess is visualized beneath the lower pole of the inferior apophyseal process, which represents the target point for needle insertion. When direct visualization is difficult the target point is located at the medial projection of the vertebral pedicle. The region overlying each aforementioned target is locally anesthetized with a 1 to 2 ml. volume of 1% Lidocaine without Epinephrine.   The spinal needle was inserted into each of the above mentioned facet joints using biplanar fluoroscopic guidance. A 0.25 to 0.5 ml. volume of Isovue-250 was injected and a partial facet joint arthrogram was obtained. A single spot film was obtained of the resulting arthrogram.    One to 1.25 ml of the steroid/anesthetic solution was then injected into each of the facet joints noted above.   Additional Comments:  The patient tolerated the  procedure well Dressing: Band-Aid    Post-procedure details: Patient was observed during the procedure. Post-procedure instructions were reviewed.  Patient left the clinic in stable condition.

## 2016-05-19 DIAGNOSIS — M545 Low back pain: Secondary | ICD-10-CM | POA: Diagnosis not present

## 2016-05-19 DIAGNOSIS — G8929 Other chronic pain: Secondary | ICD-10-CM

## 2016-05-19 DIAGNOSIS — M47816 Spondylosis without myelopathy or radiculopathy, lumbar region: Secondary | ICD-10-CM | POA: Diagnosis not present

## 2016-06-10 ENCOUNTER — Encounter (INDEPENDENT_AMBULATORY_CARE_PROVIDER_SITE_OTHER): Payer: Self-pay | Admitting: Specialist

## 2016-06-10 ENCOUNTER — Ambulatory Visit (INDEPENDENT_AMBULATORY_CARE_PROVIDER_SITE_OTHER): Payer: Federal, State, Local not specified - PPO | Admitting: Specialist

## 2016-06-10 VITALS — BP 164/60 | HR 78 | Ht 70.0 in | Wt 319.0 lb

## 2016-06-10 DIAGNOSIS — M4316 Spondylolisthesis, lumbar region: Secondary | ICD-10-CM | POA: Diagnosis not present

## 2016-06-10 NOTE — Patient Instructions (Signed)
Try to avoid twisting and bending movements.

## 2016-06-10 NOTE — Progress Notes (Signed)
Office Visit Note   Patient: Kristina Adams           Date of Birth: Dec 05, 1965           MRN: [id number] Visit Date: 06/10/2016              Requested by: Fanny Bien, MD [address] [place], [place] [address] PCP: Rachell Cipro, MD   Assessment & Plan: Visit Diagnoses:  1. Spondylolisthesis, lumbar region     Plan: With patient's ongoing low back pain I will go ahead and schedule lumbar spine MRI. It appears that she has not had a scan in quite a while. Follow-up in the office after completion to discuss results. As patient to avoid bending and twisting maneuvers.  Follow-Up Instructions: Return for Follow-up with Dr. Louanne Skye after lumbar spine MRI.   Orders:  No orders of the defined types were placed in this encounter.  No orders of the defined types were placed in this encounter.     Procedures: No procedures performed   Clinical Data: No additional findings.   Subjective: Chief Complaint  Patient presents with  . Lower Back - Pain, Follow-up    Patient returns to follow up with her low back. States she went back to work about 3 weeks ago. Pain started on left side and moving into right side. States muscles are cramping up. Takes muscle relaxer's but cant take at work because they make her sleepy. Wakes up every hour due to the pain and cramping. States she does thing the injections did help her.     Review of Systems  Constitutional: Negative.   HENT: Negative.   Respiratory: Negative.   Musculoskeletal: Positive for back pain.  Psychiatric/Behavioral: Negative.      Objective: Vital Signs: BP (!) 164/60   Pulse 78   Ht 5\' 10"  (1.778 m)   Wt (!) 319 lb (144.7 kg)   BMI 45.77 kg/m   Physical Exam  Constitutional: She is oriented to person, place, and time. No distress.  HENT:  Head: Normocephalic.  Eyes: EOM are normal. Pupils are equal, round, and reactive to light.  Abdominal: She exhibits no distension.  Neurological: She is  alert and oriented to person, place, and time.  Skin: Skin is warm and dry.    Ortho Exam Gait is normal. She does have bilateral lumbar paraspinal tenderness. Bilateral sciatic notch tenderness. Negative logroll bilateral hips. Negative straight leg raise. No focal motor deficits. Specialty Comments:  No specialty comments available.  Imaging: No results found.   PMFS History: Patient Active Problem List   Diagnosis Date Noted  . Benign essential hypertension 11/05/2015  . Gastroesophageal reflux disease 11/05/2015  . Uncontrolled type 2 diabetes mellitus with diabetic polyneuropathy, with long-term current use of insulin ([place]) 11/05/2015  . Multinodular goiter 11/06/2013  . Obesity, Class III, BMI 40-49.9 (morbid obesity) ([place]) 05/23/2013  . 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 Diabetic neuropathy ([place]) 12/13/2011  . Iron deficiency anemia 12/09/2011  . Sleep apnea 12/03/2011  . Morbid obesity with BMI of 45.0-49.9, adult ([place]) 12/03/2011  . Chronic back pain 12/03/2011  . Hypertension 12/03/2011  . Depression 12/03/2011  . Appendicitis, acute 12/03/2011  . Diabetes mellitus type 2, insulin dependent ([place]) 12/03/2011  . DM (diabetes mellitus) ([place]) 10/06/2011  . HYPOGLYCEMIA 08/22/2010  . ALLERGIC RHINITIS CAUSE UNSPECIFIED 08/22/2010  . HYPERTHYROIDISM 06/27/2010  . DIABETES MELLITUS, TYPE II, UNCONTROLLED 06/27/2010  . DYSLIPIDEMIA 06/27/2010  . MORBID OBESITY 06/27/2010  .  UNSPECIFIED ESSENTIAL HYPERTENSION 06/27/2010  . PERS HX NONCOMPLIANCE W/MED TX PRS HAZARDS HLTH 06/27/2010  . NONSPECIFIC ABN FINDING RAD & OTH EXAM GI TRACT 02/26/2009  . NEOPLASM UNCERTAIN BHV OTH&UNSPEC DIGESTIVE ORGN 02/26/2008   Past Medical History:  Diagnosis Date  . Anemia 12/09/2011   child birth  . Appendicitis, acute 12/03/2011  . Blood transfusion   . Chronic back pain 12/03/2011  . DDD (degenerative disc disease)   . Degenerative disc disease, cervical    low back area  .  Diabetes mellitus   . Diabetes mellitus type 2, insulin dependent ([place]) 12/03/2011  . GERD (gastroesophageal reflux disease)   . H/O hiatal hernia   . Headache(784.0)   . Hypertension    sees Dr. Darron Doom, [facility], [place]  . Hypertension 12/03/2011  . Morbid obesity with BMI of 45.0-49.9, adult ([place]) 12/03/2011  . Sleep apnea 12/03/2011   last sleep study May 2013  . Thyroid disease   . Urinary tract bacterial infections    hx of    Family History  Problem Relation Age of Onset  . Cancer Mother 57    breast cancer   . Diabetes Other   . Cancer Other   . Hypertension Other     Past Surgical History:  Procedure Laterality Date  . APPENDECTOMY    . CESAREAN SECTION    . LAPAROSCOPIC APPENDECTOMY  12/03/2011   Procedure: APPENDECTOMY LAPAROSCOPIC;  Surgeon: [name], DO;  [facility];  Service: General;  Laterality: N/A;  drainage of intra-abdominal abscess   . PARS PLANA VITRECTOMY Left 08/17/2012   Procedure: PARS PLANA VITRECTOMY WITH 25 GAUGE;  Surgeon: [name], MD;  Location: [facility];  Service: Ophthalmology;  Laterality: Left;  . 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   Procedure: REPAIR OF COMPLEX TRACTION RETINAL DETACHMENT;  Surgeon: [name], MD;  Location: [facility];  Service: Ophthalmology;  Laterality: Left;  . TUBAL LIGATION    . VITRECTOMY     Social History   Occupational History  . Not on file.   Social History Main Topics  . Smoking status: Never Smoker  . Smokeless tobacco: Never Used  . Alcohol use 0.6 oz/week    1 Glasses of wine per week  . Drug use: No  . Sexual activity: Yes    Birth control/ protection: Pill

## 2016-06-16 ENCOUNTER — Telehealth (INDEPENDENT_AMBULATORY_CARE_PROVIDER_SITE_OTHER): Payer: Self-pay | Admitting: *Deleted

## 2016-06-16 NOTE — Telephone Encounter (Signed)
Pt has appt scheduled at [facility] on Wed Dec 13 at 2:45pm arrival for 3:00pm appt. Left message on pt vm to return my call for appt information

## 2016-06-16 NOTE — Telephone Encounter (Signed)
Pt called back and aware of appt 

## 2016-06-23 ENCOUNTER — Ambulatory Visit (HOSPITAL_COMMUNITY): Admission: RE | Admit: 2016-06-23 | Payer: Federal, State, Local not specified - PPO | Source: Ambulatory Visit

## 2016-06-30 ENCOUNTER — Ambulatory Visit (HOSPITAL_COMMUNITY)
Admission: RE | Admit: 2016-06-30 | Discharge: 2016-06-30 | Disposition: A | Discharge status: Home / Self Care | Payer: Federal, State, Local not specified - PPO | Source: Ambulatory Visit | Attending: Surgery | Admitting: Surgery

## 2016-06-30 DIAGNOSIS — M47897 Other spondylosis, lumbosacral region: Secondary | ICD-10-CM | POA: Insufficient documentation

## 2016-06-30 DIAGNOSIS — M48061 Spinal stenosis, lumbar region without neurogenic claudication: Secondary | ICD-10-CM | POA: Diagnosis not present

## 2016-06-30 DIAGNOSIS — M47896 Other spondylosis, lumbar region: Secondary | ICD-10-CM | POA: Diagnosis not present

## 2016-06-30 DIAGNOSIS — M4316 Spondylolisthesis, lumbar region: Secondary | ICD-10-CM | POA: Insufficient documentation

## 2016-07-23 ENCOUNTER — Ambulatory Visit (INDEPENDENT_AMBULATORY_CARE_PROVIDER_SITE_OTHER): Payer: Federal, State, Local not specified - PPO | Admitting: Specialist

## 2016-07-23 ENCOUNTER — Encounter (INDEPENDENT_AMBULATORY_CARE_PROVIDER_SITE_OTHER): Payer: Self-pay | Admitting: Specialist

## 2016-07-23 VITALS — BP 167/74 | HR 65 | Ht 70.0 in | Wt 330.0 lb

## 2016-07-23 DIAGNOSIS — M4316 Spondylolisthesis, lumbar region: Secondary | ICD-10-CM

## 2016-07-23 DIAGNOSIS — M47816 Spondylosis without myelopathy or radiculopathy, lumbar region: Secondary | ICD-10-CM | POA: Diagnosis not present

## 2016-07-23 NOTE — Progress Notes (Signed)
Office Visit Note   Patient: Kristina Adams           Date of Birth: December 14, 1965           MRN: [id number] Visit Date: 07/23/2016              Requested by: Kristina Bien, MD [address] [place], [place] [address] PCP: Kristina Cipro, MD   Assessment & Plan: Visit Diagnoses:  1. Spondylosis without myelopathy or radiculopathy, lumbar region   2. Spondylolisthesis, lumbar region     Plan: Knee is suffering from osteoarthritis, only real proven treatments are Weight loss, NSIADs like diclofenac and exercise. Well padded shoes help. Ice the knee 2-3 times a day 15-20 mins at a time. Avoid frequent bending and stooping  No lifting greater than 10 lbs. May use ice or moist heat for pain. Weight loss is of benefit.   Follow-Up Instructions: Return in about 3 months (around 10/21/2016).   Orders:  No orders of the defined types were placed in this encounter.  No orders of the defined types were placed in this encounter.     Procedures: No procedures performed   Clinical Data: No additional findings.   Subjective: Chief Complaint  Patient presents with  . Lower Back - Follow-up    Had lumbar MRI 06/30/2016    Kristina Adams is here to review her MRI of Lumbar spine that was performed on 06/30/2016.  She says there are no changes in her symptoms.  However she is a horrible cough and it has caused a "cramp-like" pain on her left side, and it has been bothering her for at least the last 36 hours.      Review of Systems   Objective: Vital Signs: BP (!) 167/74 (BP Location: Right Arm, Patient Position: Sitting)   Pulse 65   Ht 5\' 10"  (1.778 m)   Wt (!) 330 lb (149.7 kg)   BMI 47.35 kg/m   Physical Exam  Back Exam   Tenderness  The patient is experiencing tenderness in the lumbar.  Range of Motion  Extension: abnormal  Flexion: abnormal  Lateral Bend Right: abnormal  Lateral Bend Left: abnormal  Rotation Right: abnormal  Rotation Left: abnormal     Muscle Strength  Right Quadriceps:  5/5  Left Quadriceps:  5/5  Right Hamstrings:  5/5  Left Hamstrings:  5/5   Reflexes  Patellar:  Hyporeflexic normal Achilles:  Hyporeflexic normal Biceps: normal Babinski's sign: normal   Other  Toe Walk: normal Heel Walk: normal Sensation: normal Gait: normal  Erythema: no back redness Scars: present      Specialty Comments:  No specialty comments available.  Imaging: No results found.   PMFS History: Patient Active Problem List   Diagnosis Date Noted  . Benign essential hypertension 11/05/2015  . Gastroesophageal reflux disease 11/05/2015  . Uncontrolled type 2 diabetes mellitus with diabetic polyneuropathy, with long-term current use of insulin ([place]) 11/05/2015  . Multinodular goiter 11/06/2013  . Obesity, Class III, BMI 40-49.9 (morbid obesity) ([place]) 05/23/2013  . 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 Diabetic neuropathy ([place]) 12/13/2011  . Iron deficiency anemia 12/09/2011  . Sleep apnea 12/03/2011  . Morbid obesity with BMI of 45.0-49.9, adult ([place]) 12/03/2011  . Chronic back pain 12/03/2011  . Hypertension 12/03/2011  . Depression 12/03/2011  . Appendicitis, acute 12/03/2011  . Diabetes mellitus type 2, insulin dependent ([place]) 12/03/2011  . DM (diabetes mellitus) ([place]) 10/06/2011  . HYPOGLYCEMIA 08/22/2010  .  ALLERGIC RHINITIS CAUSE UNSPECIFIED 08/22/2010  . HYPERTHYROIDISM 06/27/2010  . DIABETES MELLITUS, TYPE II, UNCONTROLLED 06/27/2010  . DYSLIPIDEMIA 06/27/2010  . MORBID OBESITY 06/27/2010  . UNSPECIFIED ESSENTIAL HYPERTENSION 06/27/2010  . PERS HX NONCOMPLIANCE W/MED TX PRS HAZARDS HLTH 06/27/2010  . NONSPECIFIC ABN FINDING RAD & OTH EXAM GI TRACT 02/26/2009  . NEOPLASM UNCERTAIN BHV OTH&UNSPEC DIGESTIVE ORGN 02/26/2008   Past Medical History:  Diagnosis Date  . Anemia 12/09/2011   child birth  . Appendicitis, acute 12/03/2011  . Blood transfusion   . Chronic back pain 12/03/2011  . DDD  (degenerative disc disease)   . Degenerative disc disease, cervical    low back area  . Diabetes mellitus   . Diabetes mellitus type 2, insulin dependent ([place]) 12/03/2011  . GERD (gastroesophageal reflux disease)   . H/O hiatal hernia   . Headache(784.0)   . Hypertension    sees Dr. Darron Doom, [facility], [place]  . Hypertension 12/03/2011  . Morbid obesity with BMI of 45.0-49.9, adult ([place]) 12/03/2011  . Sleep apnea 12/03/2011   last sleep study May 2013  . Thyroid disease   . Urinary tract bacterial infections    hx of    Family History  Problem Relation Age of Onset  . Cancer Mother 25    breast cancer   . Diabetes Other   . Cancer Other   . Hypertension Other     Past Surgical History:  Procedure Laterality Date  . APPENDECTOMY    . CESAREAN SECTION    . LAPAROSCOPIC APPENDECTOMY  12/03/2011   Procedure: APPENDECTOMY LAPAROSCOPIC;  Surgeon: Kristina Hook, DO;  Location: [facility];  Service: General;  Laterality: N/A;  drainage of intra-abdominal abscess   . PARS PLANA VITRECTOMY Left 08/17/2012   Procedure: PARS PLANA VITRECTOMY WITH 25 GAUGE;  Surgeon: Kristina Pedro, MD;  Location: [facility];  Service: Ophthalmology;  Laterality: Left;  . 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   Procedure: REPAIR OF COMPLEX TRACTION RETINAL DETACHMENT;  Surgeon: Kristina Pedro, MD;  Location: [facility];  Service: Ophthalmology;  Laterality: Left;  . TUBAL LIGATION    . VITRECTOMY     Social History   Occupational History  . Not on file.   Social History Main Topics  . Smoking status: Never Smoker  . Smokeless tobacco: Never Used  . Alcohol use 0.6 oz/week    1 Glasses of wine per week  . Drug use: No  . Sexual activity: Yes    Birth control/ protection: Pill

## 2016-07-23 NOTE — Patient Instructions (Signed)
  Knee is suffering from osteoarthritis, only real proven treatments are Weight loss, NSIADs like diclofenac and exercise. Well padded shoes help. Ice the knee 2-3 times a day 15-20 mins at a time. Avoid frequent bending and stooping  No lifting greater than 10 lbs. May use ice or moist heat for pain. Weight loss is of benefit.

## 2016-08-30 ENCOUNTER — Encounter (INDEPENDENT_AMBULATORY_CARE_PROVIDER_SITE_OTHER): Payer: Federal, State, Local not specified - PPO | Admitting: Physical Medicine and Rehabilitation

## 2016-09-15 ENCOUNTER — Other Ambulatory Visit: Payer: Self-pay | Admitting: Endocrinology

## 2016-09-15 DIAGNOSIS — E049 Nontoxic goiter, unspecified: Secondary | ICD-10-CM

## 2016-09-17 ENCOUNTER — Ambulatory Visit (INDEPENDENT_AMBULATORY_CARE_PROVIDER_SITE_OTHER): Payer: Federal, State, Local not specified - PPO | Admitting: Physical Medicine and Rehabilitation

## 2016-09-17 ENCOUNTER — Encounter (INDEPENDENT_AMBULATORY_CARE_PROVIDER_SITE_OTHER): Payer: Self-pay

## 2016-09-17 ENCOUNTER — Ambulatory Visit (INDEPENDENT_AMBULATORY_CARE_PROVIDER_SITE_OTHER): Payer: Self-pay

## 2016-09-17 ENCOUNTER — Encounter (INDEPENDENT_AMBULATORY_CARE_PROVIDER_SITE_OTHER): Payer: Self-pay | Admitting: Physical Medicine and Rehabilitation

## 2016-09-17 VITALS — BP 153/70 | HR 74 | Temp 98.8°F

## 2016-09-17 DIAGNOSIS — M47816 Spondylosis without myelopathy or radiculopathy, lumbar region: Secondary | ICD-10-CM

## 2016-09-17 MED ORDER — LIDOCAINE HCL (PF) 1 % IJ SOLN: 0.3300 mL | Freq: Once | INTRAMUSCULAR | Status: DC

## 2016-09-17 MED ORDER — METHYLPREDNISOLONE ACETATE 80 MG/ML IJ SUSP: 80.0000 mg | Freq: Once | INTRAMUSCULAR | Status: DC

## 2016-09-17 NOTE — Patient Instructions (Addendum)
Radiofrequency Ablation of the Facet Joints [facility], [facility], [email or web address]  [employer] Discharge Instructions  *At any time if you have questions or concerns they can be answered by calling [phone]  All Patients: . You may experience an increase in your symptoms for the first 2 days (it can take 2 days to 2 weeks for the steroid/cortisone to have its maximal effect). . You may use ice to the site for the first 24 hours; 20 minutes on and 20 minutes off and may use heat after that time. . You may resume and continue your current pain medications. If you need a refill please contact the prescribing physician. . You may resume her regular medications if any were stopped for the procedure. . You may shower but no swimming, tub bath or Jacuzzi for 24 hours. . Please remove bandage after 4 hours. . You may resume light activities as tolerated. . If you had Spine Injection, you should not drive for the next 3 hours due to anesthetics used in the procedure. Please have someone drive for you.  *If you have had sedation, Valium, Xanax, or lorazepam: Do not drive or use public transportation for 24 hours, do not operating hazardous machinery or make important personal/business decisions for 24 hours.  POSSIBLE STEROID SIDE EFFECTS: If experienced these should only last for a short period. Change in menstrual flow  Edema in (swelling)  Increased appetite Skin flushing (redness)  Skin rash/acne  Thrush (oral) Vaginitis    Increased sweating  Depression Increased blood glucose levels Cramping and leg/calf  Euphoria (feeling happy)  POSSIBLE PROCEDURE SIDE EFFECTS: Please call our office if concerned. Increased pain Increased numbness/tingling  Headache Nausea/vomiting Hematoma (bruising/bleeding) Edema (swelling at the site) Weakness  Infection (red/drainage at site) Fever greater than 100.62F  *In the event of a headache after epidural steroid injection:  Drink plenty of fluids, especially water and try to lay flat when possible. If the headache does not get better after a few days or as always if concerned please call the office.

## 2016-09-17 NOTE — Progress Notes (Signed)
Kristina Adams - 51 y.o. female MRN [id number]  Date of birth: 11-24-1965  Office Visit Note: Visit Date: 09/17/2016 PCP: Rachell Cipro, MD Referred by: Fanny Bien, MD  Subjective: Chief Complaint  Patient presents with  . Lower Back - Pain   HPI: Kristina Adams is a pleasant 51 year old female with chronic pain across lower back. She is followed by Dr. Louanne Skye. He did ask [place] to repeat facet joint blocks for her. This is an L4-5 and L5-S1. He had also asked to review radiofrequency ablation. Unfortunately, we have discussed radiofrequency ablation with her on a couple of occasions. She seems very reluctant to consider that procedure. Diagnostically she has had 2 sets of facet joint blocks and they have lasted several months to several weeks. Did well with last injection for several weeks. Increased pain this last month. Denies leg pain. Worse with laying or sitting. When you ask her length of time the really good relief lasted several weeks but is still been better than it was previously. She does have findings of pretty significant facet arthropathy for her age. She has no nerve compression or radicular complaints. Again radiofrequency ablation would be hard to do with her due to her body habitus and size. I have given her some information and resources that she continues to look up the procedure if it somewhat of an anxiety issue. In terms of doing a radiofrequency ablation she would needed to have failed conservative care including physical therapy or chiropractic and we would need to confirm this with written notes in the chart.    ROS Otherwise per HPI.  Assessment & Plan: Visit Diagnoses:  1. Spondylosis without myelopathy or radiculopathy, lumbar region     Plan: Findings:  Bilateral L4-5 and L5-S1 facet joint blocks.    Meds & Orders:  Meds ordered this encounter  Medications  . lidocaine (PF) (XYLOCAINE) 1 % injection 0.3 mL  . methylPREDNISolone acetate (DEPO-MEDROL)  injection 80 mg    Orders Placed This Encounter  Procedures  . Facet Injection  . XR C-ARM NO REPORT    Follow-up: Return if symptoms worsen or fail to improve.   Procedures: No procedures performed  Lumbar Facet Joint Intra-Articular Injection(s) with Fluoroscopic Guidance  Patient: Kristina Adams      Date of Birth: [birth year] MRN: [id number] PCP: Rachell Cipro, MD      Visit Date: 09/17/2016   Universal Protocol:    Date/Time: 03/12/185:50 AM  Consent Given By: the patient  Position: PRONE   Additional Comments: Vital signs were monitored before and after the procedure. Patient was prepped and draped in the usual sterile fashion. The correct patient, procedure, and site was verified.   Injection Procedure Details:  Procedure Site One Meds Administered:  Meds ordered this encounter  Medications  . lidocaine (PF) (XYLOCAINE) 1 % injection 0.3 mL  . methylPREDNISolone acetate (DEPO-MEDROL) injection 80 mg     Laterality: Bilateral  Location/Site:  L4-L5 L5-S1  Needle size: 22 guage  Needle type: Spinal  Needle Placement: Articular  Findings:  -Contrast Used: 1 mL iohexol 180 mg iodine/mL   -Comments: Excellent flow of contrast producing a partial arthrogram.  Procedure Details: The fluoroscope beam is vertically oriented in AP, and the inferior recess is visualized beneath the lower pole of the inferior apophyseal process, which represents the target point for needle insertion. When direct visualization is difficult the target point is located at the medial projection of the vertebral pedicle. The region overlying each aforementioned target  is locally anesthetized with a 1 to 2 ml. volume of 1% Lidocaine without Epinephrine.   The spinal needle was inserted into each of the above mentioned facet joints using biplanar fluoroscopic guidance. A 0.25 to 0.5 ml. volume of Isovue-250 was injected and a partial facet joint arthrogram was obtained. A single spot  film was obtained of the resulting arthrogram.    One to 1.25 ml of the steroid/anesthetic solution was then injected into each of the facet joints noted above.   Additional Comments:  The patient tolerated the procedure well Dressing: Band-Aid    Post-procedure details: Patient was observed during the procedure. Post-procedure instructions were reviewed.  Patient left the clinic in stable condition.     Clinical History: No specialty comments available.  She reports that she has never smoked. She has never used smokeless tobacco. No results for input(s): HGBA1C, LABURIC in the last 8760 hours.  Objective:  VS:  HT:    WT:   BMI:     BP:(!) 153/70  HR:74bpm  TEMP:98.8 F (37.1 C)( )  RESP:99 % Physical Exam  Musculoskeletal:  Patient is slow to rise from a seated position and has concordant low back pain with extension rotation of the lumbar spine. She has good distal strength.    Ortho Exam Imaging: No results found.  Past Medical/Family/Surgical/Social History: Medications & Allergies reviewed per EMR Patient Active Problem List   Diagnosis Date Noted  . Benign essential hypertension 11/05/2015  . Gastroesophageal reflux disease 11/05/2015  . Uncontrolled type 2 diabetes mellitus with diabetic polyneuropathy, with long-term current use of insulin ([place]) 11/05/2015  . Multinodular goiter 11/06/2013  . Obesity, Class III, BMI 40-49.9 (morbid obesity) ([place]) 05/23/2013  . 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 Diabetic neuropathy ([place]) 12/13/2011  . Iron deficiency anemia 12/09/2011  . Sleep apnea 12/03/2011  . Morbid obesity with BMI of 45.0-49.9, adult ([place]) 12/03/2011  . Chronic back pain 12/03/2011  . Hypertension 12/03/2011  . Depression 12/03/2011  . Appendicitis, acute 12/03/2011  . Diabetes mellitus type 2, insulin dependent ([place]) 12/03/2011  . DM (diabetes mellitus) ([place]) 10/06/2011  . HYPOGLYCEMIA 08/22/2010  . ALLERGIC RHINITIS CAUSE UNSPECIFIED  08/22/2010  . HYPERTHYROIDISM 06/27/2010  . DIABETES MELLITUS, TYPE II, UNCONTROLLED 06/27/2010  . DYSLIPIDEMIA 06/27/2010  . MORBID OBESITY 06/27/2010  . UNSPECIFIED ESSENTIAL HYPERTENSION 06/27/2010  . PERS HX NONCOMPLIANCE W/MED TX PRS HAZARDS HLTH 06/27/2010  . NONSPECIFIC ABN FINDING RAD & OTH EXAM GI TRACT 02/26/2009  . NEOPLASM UNCERTAIN BHV OTH&UNSPEC DIGESTIVE ORGN 02/26/2008   Past Medical History:  Diagnosis Date  . Anemia 12/09/2011   child birth  . Appendicitis, acute 12/03/2011  . Blood transfusion   . Chronic back pain 12/03/2011  . DDD (degenerative disc disease)   . Degenerative disc disease, cervical    low back area  . Diabetes mellitus   . Diabetes mellitus type 2, insulin dependent ([place]) 12/03/2011  . GERD (gastroesophageal reflux disease)   . H/O hiatal hernia   . Headache(784.0)   . Hypertension    sees Dr. Darron Doom, [facility], [place]  . Hypertension 12/03/2011  . Morbid obesity with BMI of 45.0-49.9, adult ([place]) 12/03/2011  . Sleep apnea 12/03/2011   last sleep study May 2013  . Thyroid disease   . Urinary tract bacterial infections    hx of   Family History  Problem Relation Age of Onset  . Cancer Mother 54    breast cancer   . Diabetes Other   .  Cancer Other   . Hypertension Other    Past Surgical History:  Procedure Laterality Date  . APPENDECTOMY    . CESAREAN SECTION    . LAPAROSCOPIC APPENDECTOMY  12/03/2011   Procedure: APPENDECTOMY LAPAROSCOPIC;  Surgeon: [name], DO;  Location: [facility];  Service: General;  Laterality: N/A;  drainage of intra-abdominal abscess   . PARS PLANA VITRECTOMY Left 08/17/2012   Procedure: PARS PLANA VITRECTOMY WITH 25 GAUGE;  Surgeon: [name], MD;  Location: [facility];  Service: Ophthalmology;  Laterality: Left;  . 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   Procedure: REPAIR OF COMPLEX TRACTION RETINAL DETACHMENT;  Surgeon: [name], MD;  Location: [facility];   Service: Ophthalmology;  Laterality: Left;  . TUBAL LIGATION    . VITRECTOMY     Social History   Occupational History  . Not on file.   Social History Main Topics  . Smoking status: Never Smoker  . Smokeless tobacco: Never Used  . Alcohol use 0.6 oz/week    1 Glasses of wine per week  . Drug use: No  . Sexual activity: Yes    Birth control/ protection: Pill

## 2016-09-20 NOTE — Procedures (Signed)
Lumbar Facet Joint Intra-Articular Injection(s) with Fluoroscopic Guidance  Patient: Kristina Adams      Date of Birth: 07-10-1966 MRN: [id number] PCP: Rachell Cipro, MD      Visit Date: 09/17/2016   Universal Protocol:    Date/Time: 03/12/185:50 AM  Consent Given By: the patient  Position: PRONE   Additional Comments: Vital signs were monitored before and after the procedure. Patient was prepped and draped in the usual sterile fashion. The correct patient, procedure, and site was verified.   Injection Procedure Details:  Procedure Site One Meds Administered:  Meds ordered this encounter  Medications  . lidocaine (PF) (XYLOCAINE) 1 % injection 0.3 mL  . methylPREDNISolone acetate (DEPO-MEDROL) injection 80 mg     Laterality: Bilateral  Location/Site:  L4-L5 L5-S1  Needle size: 22 guage  Needle type: Spinal  Needle Placement: Articular  Findings:  -Contrast Used: 1 mL iohexol 180 mg iodine/mL   -Comments: Excellent flow of contrast producing a partial arthrogram.  Procedure Details: The fluoroscope beam is vertically oriented in AP, and the inferior recess is visualized beneath the lower pole of the inferior apophyseal process, which represents the target point for needle insertion. When direct visualization is difficult the target point is located at the medial projection of the vertebral pedicle. The region overlying each aforementioned target is locally anesthetized with a 1 to 2 ml. volume of 1% Lidocaine without Epinephrine.   The spinal needle was inserted into each of the above mentioned facet joints using biplanar fluoroscopic guidance. A 0.25 to 0.5 ml. volume of Isovue-250 was injected and a partial facet joint arthrogram was obtained. A single spot film was obtained of the resulting arthrogram.    One to 1.25 ml of the steroid/anesthetic solution was then injected into each of the facet joints noted above.   Additional Comments:  The patient tolerated the  procedure well Dressing: Band-Aid    Post-procedure details: Patient was observed during the procedure. Post-procedure instructions were reviewed.  Patient left the clinic in stable condition.

## 2016-10-04 ENCOUNTER — Encounter (INDEPENDENT_AMBULATORY_CARE_PROVIDER_SITE_OTHER): Payer: Self-pay | Admitting: Specialist

## 2016-10-04 ENCOUNTER — Ambulatory Visit
Admission: RE | Admit: 2016-10-04 | Discharge: 2016-10-04 | Disposition: A | Discharge status: Home / Self Care | Payer: Federal, State, Local not specified - PPO | Source: Ambulatory Visit | Attending: Endocrinology | Admitting: Endocrinology

## 2016-10-04 ENCOUNTER — Ambulatory Visit (INDEPENDENT_AMBULATORY_CARE_PROVIDER_SITE_OTHER): Payer: Federal, State, Local not specified - PPO | Admitting: Specialist

## 2016-10-04 VITALS — BP 149/72 | HR 64 | Ht 70.0 in | Wt 330.0 lb

## 2016-10-04 DIAGNOSIS — G8929 Other chronic pain: Secondary | ICD-10-CM

## 2016-10-04 DIAGNOSIS — R109 Unspecified abdominal pain: Secondary | ICD-10-CM | POA: Diagnosis not present

## 2016-10-04 DIAGNOSIS — E049 Nontoxic goiter, unspecified: Secondary | ICD-10-CM

## 2016-10-04 MED ORDER — TRAMADOL-ACETAMINOPHEN 37.5-325 MG PO TABS: 1.0000 | ORAL_TABLET | Freq: Four times a day (QID) | ORAL | 0 refills | Status: DC | PRN

## 2016-10-04 NOTE — Patient Instructions (Addendum)
Avoid bending, stooping and avoid lifting weights greater than 10 lbs. Avoid prolong standing and walking. Avoid frequent bending and stooping  No lifting greater than 10 lbs. May use ice or moist heat for pain. Weight loss is of GI specialist as to his recommentations. Tramadol for discomfort. See Physical therapist at [facility]. For exercises for thoracolumbar pain, negative exam for nerve compromise.

## 2016-10-04 NOTE — Progress Notes (Addendum)
Office Visit Note   Patient: Kristina Adams           Date of Birth: [birth year]           MRN: [id number] Visit Date: 10/04/2016              Requested by: Kristina Bien, MD Kristina Adams Kristina Adams, Kristina Adams PCP: Kristina Cipro, MD   Assessment & Plan: Visit Diagnoses:  1. Left flank pain, chronic   Flank pain can be due to mechanical discomfort, degenerative disc disease, arthritis of the thoracolumbar spine or muscular strain, however onset in the middle of the night with no anticedent injury or increase in activity that would incite localized discomfort mechanically is worrisome for a visceral cause of pain and a reevaluation of the history of UTI and IBS needs to be done. For possible mechanical back pain, enough so that a person is not able to work that has been present for nearly a month I would recommend ultracet, to decrease any affect NSAIDs may have on  A diagnosed IBS stop the diclofenac, any ibuprofen or alleve. Start a PT program to assess the disfunction and improve her core strength.  NSAID medications can irritate IBS, instead take the tramadol in the form of ultracet. She has been advised to contact her primary care MD, she reports she is in between primary care providers and Has been to urgent care, so she should return to urgent care to have a repeat UA and eval for either IBS or persistent UTI or nephrolithiasis. Allow out of work for 10 days then trial return to work. Return visit in 3 weeks.    Plan:Avoid bending, stooping and avoid lifting weights greater than 10 lbs. Avoid prolong standing and walking. Avoid frequent bending and stooping  No lifting greater than 10 lbs. May use ice or moist heat for pain. Weight loss is of benefit.  Follow-Up Instructions: No Follow-up on file.   Orders:  Orders Placed This Encounter  Procedures  . Ambulatory referral to Physical Therapy   Meds ordered this encounter  Medications  . traMADol-acetaminophen  (ULTRACET) 37.5-325 MG tablet    Sig: Take 1-2 tablets by mouth every 6 (six) hours as needed.    Dispense:  40 tablet    Refill:  0      Procedures: No procedures performed   Clinical Data: No additional findings.   Subjective: Chief Complaint  Patient presents with  . Lower Back - Pain    Kristina Adams is here for left sided low back pain that she states comes and goes.  She states that was only at night but now she is feeling it some when she sits up.  She had an injection with Kristina Adams 2 weeks ago, but the pain is in a different area now.  She has been experiencing left flank pain pain that is sharp and piercing, feels a popping like pain On a scale of 1-10 pain is an "8" when lying down, sitting up it is a 4-5. No leg pain numbness or weakness. No bowel or bladder difficulties. Taking diclofenac, no side effects. Had injection by Kristina. Ernestina Adams and reportedly discussed the discomfort with Kristina. Ernestina Adams.  Pain is not stopping even with ibuprofen, cyclobenzaprine and antigas medications. Reprts some decrease size. Saw Kristina Adams, blood glucose was elevated and also had a UTI which she has been treated for with an antibiotic. Pain began about a month ago awakening her at night, feels  like gas.     Review of Systems  Constitutional: Negative.   HENT: Negative.   Eyes: Negative.   Respiratory: Negative.   Cardiovascular: Negative.   Gastrointestinal: Negative.   Endocrine: Negative.   Genitourinary: Negative.   Musculoskeletal: Negative.   Skin: Negative.   Allergic/Immunologic: Negative.   Neurological: Negative.   Hematological: Negative.   Psychiatric/Behavioral: Negative.      Objective: Vital Signs: BP (!) 149/72 (BP Location: Left Arm, Patient Position: Sitting)   Pulse 64   Ht 5\' 10"  (1.778 m)   Wt (!) 330 lb (149.7 kg)   BMI 47.35 kg/m   Physical Exam  Constitutional: She is oriented to person, place, and time. She appears well-developed and well-nourished.    HENT:  Head: Normocephalic and atraumatic.  Eyes: EOM are normal. Pupils are equal, round, and reactive to light.  Neck: Normal range of motion. Neck supple.  Pulmonary/Chest: Effort normal and breath sounds normal.  Abdominal: Soft. Bowel sounds are normal. There is tenderness. There is guarding.  Neurological: She is alert and oriented to person, place, and time.  Skin: Skin is warm and dry.  Psychiatric: She has a normal mood and affect. Her behavior is normal. Judgment and thought content normal.    Back Exam   Tenderness  The patient is experiencing tenderness in the thoracic.  Range of Motion  Extension: normal  Flexion: normal  Lateral Bend Right: abnormal  Lateral Bend Left: abnormal  Rotation Right: abnormal  Rotation Left: abnormal   Muscle Strength  Right Quadriceps:  5/5  Left Quadriceps:  5/5  Right Hamstrings:  5/5  Left Hamstrings:  5/5   Tests  Straight leg raise right: negative Straight leg raise left: negative  Reflexes  Patellar: 0/4 Achilles: 0/4 Babinski's sign: normal   Other  Toe Walk: normal Heel Walk: normal Sensation: normal Gait: normal  Erythema: no back redness Scars: absent      Specialty Comments:  No specialty comments available.  Imaging: No results found.   PMFS History: Patient Active Problem List   Diagnosis Date Noted  . Benign essential hypertension 11/05/2015  . Gastroesophageal reflux disease 11/05/2015  . Uncontrolled type 2 diabetes mellitus with diabetic polyneuropathy, with long-term current use of insulin ([place]) 11/05/2015  . Multinodular goiter 11/06/2013  . Obesity, Class III, BMI 40-49.9 (morbid obesity) ([place]) 05/23/2013  . 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 Diabetic neuropathy ([place]) 12/13/2011  . Iron deficiency anemia 12/09/2011  . Sleep apnea 12/03/2011  . Morbid obesity with BMI of 45.0-49.9, adult ([place]) 12/03/2011  . Chronic back pain 12/03/2011  . Hypertension 12/03/2011  .  Depression 12/03/2011  . Appendicitis, acute 12/03/2011  . Diabetes mellitus type 2, insulin dependent ([place]) 12/03/2011  . DM (diabetes mellitus) ([place]) 10/06/2011  . HYPOGLYCEMIA 08/22/2010  . ALLERGIC RHINITIS CAUSE UNSPECIFIED 08/22/2010  . HYPERTHYROIDISM 06/27/2010  . DIABETES MELLITUS, TYPE II, UNCONTROLLED 06/27/2010  . DYSLIPIDEMIA 06/27/2010  . MORBID OBESITY 06/27/2010  . UNSPECIFIED ESSENTIAL HYPERTENSION 06/27/2010  . PERS HX NONCOMPLIANCE W/MED TX PRS HAZARDS HLTH 06/27/2010  . NONSPECIFIC ABN FINDING RAD & OTH EXAM GI TRACT 02/26/2009  . NEOPLASM UNCERTAIN BHV OTH&UNSPEC DIGESTIVE ORGN 02/26/2008   Past Medical History:  Diagnosis Date  . Anemia 12/09/2011   child birth  . Appendicitis, acute 12/03/2011  . Blood transfusion   . Chronic back pain 12/03/2011  . DDD (degenerative disc disease)   . Degenerative disc disease, cervical    low back area  . Diabetes mellitus   .  Diabetes mellitus type 2, insulin dependent ([place]) 12/03/2011  . GERD (gastroesophageal reflux disease)   . H/O hiatal hernia   . Headache(784.0)   . Hypertension    sees Kristina. Darron Doom, [facility], [place]  . Hypertension 12/03/2011  . Morbid obesity with BMI of 45.0-49.9, adult ([place]) 12/03/2011  . Sleep apnea 12/03/2011   last sleep study May 2013  . Thyroid disease   . Urinary tract bacterial infections    hx of    Family History  Problem Relation Age of Onset  . Cancer Mother 53    breast cancer   . Diabetes Other   . Cancer Other   . Hypertension Other     Past Surgical History:  Procedure Laterality Date  . APPENDECTOMY    . CESAREAN SECTION    . LAPAROSCOPIC APPENDECTOMY  12/03/2011   Procedure: APPENDECTOMY LAPAROSCOPIC;  Surgeon: [name], DO;  Location: [facility];  Service: General;  Laterality: N/A;  drainage of intra-abdominal abscess   . PARS PLANA VITRECTOMY Left 08/17/2012   Procedure: PARS PLANA VITRECTOMY WITH 25 GAUGE;  Surgeon: [name], MD;   Location: [facility];  Service: Ophthalmology;  Laterality: Left;  . 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   Procedure: REPAIR OF COMPLEX TRACTION RETINAL DETACHMENT;  Surgeon: [name], MD;  Location: [facility];  Service: Ophthalmology;  Laterality: Left;  . TUBAL LIGATION    . VITRECTOMY     Social History   Occupational History  . Not on file.   Social History Main Topics  . Smoking status: Never Smoker  . Smokeless tobacco: Never Used  . Alcohol use 0.6 oz/week    1 Glasses of wine per week  . Drug use: No  . Sexual activity: Yes    Birth control/ protection: Pill

## 2016-10-06 ENCOUNTER — Encounter: Payer: Self-pay | Admitting: Physical Therapy

## 2016-10-06 ENCOUNTER — Ambulatory Visit: Payer: Federal, State, Local not specified - PPO | Attending: Specialist | Admitting: Physical Therapy

## 2016-10-06 DIAGNOSIS — M545 Low back pain, unspecified: Secondary | ICD-10-CM

## 2016-10-06 DIAGNOSIS — G8929 Other chronic pain: Secondary | ICD-10-CM | POA: Diagnosis present

## 2016-10-06 DIAGNOSIS — M62830 Muscle spasm of back: Secondary | ICD-10-CM

## 2016-10-06 DIAGNOSIS — M6281 Muscle weakness (generalized): Secondary | ICD-10-CM | POA: Diagnosis present

## 2016-10-07 NOTE — Therapy (Addendum)
[place], [place], [address] Phone: [phone]   Fax:  [phone]  Physical Therapy Evaluation/ Discharge   Patient Details  Name: Kristina Adams MRN: [id number] Date of Birth: Jun 14, 1966 Referring Provider: Dr Basil Dess   Encounter Date: 10/06/2016      PT End of Session - 10/07/16 1044    Visit Number 1   Number of Visits 16   Date for PT Re-Evaluation 12/02/16   Authorization Type [employer] 40 dollar co-pay   PT Start Time 1145   PT Stop Time 1230   PT Time Calculation (min) 45 min   Activity Tolerance Patient tolerated treatment well   Behavior During Therapy [facility] for tasks assessed/performed      Past Medical History:  Diagnosis Date  . Anemia 12/09/2011   child birth  . Appendicitis, acute 12/03/2011  . Blood transfusion   . Chronic back pain 12/03/2011  . DDD (degenerative disc disease)   . Degenerative disc disease, cervical    low back area  . Diabetes mellitus   . Diabetes mellitus type 2, insulin dependent ([place]) 12/03/2011  . GERD (gastroesophageal reflux disease)   . H/O hiatal hernia   . Headache(784.0)   . Hypertension    sees Dr. Darron Doom, [facility], [place]  . Hypertension 12/03/2011  . Morbid obesity with BMI of 45.0-49.9, adult ([place]) 12/03/2011  . Sleep apnea 12/03/2011   last sleep study May 2013  . Thyroid disease   . Urinary tract bacterial infections    hx of    Past Surgical History:  Procedure Laterality Date  . APPENDECTOMY    . CESAREAN SECTION    . LAPAROSCOPIC APPENDECTOMY  12/03/2011   Procedure: APPENDECTOMY LAPAROSCOPIC;  Surgeon: [name], DO;  [facility];  Service: General;  Laterality: N/A;  drainage of intra-abdominal abscess   . PARS PLANA VITRECTOMY Left 08/17/2012   Procedure: PARS PLANA VITRECTOMY WITH 25 GAUGE;  Surgeon: [name], MD;  Location: [facility];  Service: Ophthalmology;  Laterality: Left;  . REPAIR OF COMPLEX  TRACTION RETINAL DETACHMENT Left 08/17/2012   Procedure: REPAIR OF COMPLEX TRACTION RETINAL DETACHMENT;  Surgeon: [name], MD;  Location: [facility];  Service: Ophthalmology;  Laterality: Left;  . TUBAL LIGATION    . VITRECTOMY      There were no vitals filed for this visit.       Subjective Assessment - 10/06/16 1200    Subjective Patient has a long history of lower back pain. Her lower back pain comes and goes but a few weeks ago she began having flack pain on the left. The pain is owrse at night. She sleeps on her back and side. The pain is worse when she lies down. Once she is up and moving the pain tends to disapate   Currently in Pain? Yes   Pain Score 8   Pain can reach an 8/10 at night    Pain Location Back   Pain Orientation Left  flank   Pain Descriptors / Indicators Aching   Pain Type Chronic pain   Pain Onset More than a month ago   Pain Frequency Intermittent   Aggravating Factors  lying down    Pain Relieving Factors getting up and moving, muscle relaxor    Multiple Pain Sites No            [facility] PT Assessment - 10/07/16 0001      Assessment  Medical Diagnosis Left flank pain    Referring Provider Dr Basil Dess    Onset Date/Surgical Date --  2 weeks but long histroy of stenosis    Hand Dominance Right   Next MD Visit 3 weeks    Prior Therapy None      Precautions   Precautions None     Restrictions   Weight Bearing Restrictions No   Other Position/Activity Restrictions n     Balance Screen   Has the patient fallen in the past 6 months No   Has the patient had a decrease in activity level because of a fear of falling?  No   Is the patient reluctant to leave their home because of a fear of falling?  No     Home Environment   Additional Comments Nothing pertinent      Prior Function   Level of Independence Independent   Vocation Full time employment   Vocation Requirements Bending; lifting, twisting; on her feet for long periods of time     Leisure Nothing      Cognition   Overall Cognitive Status Within Functional Limits for tasks assessed   Attention Focused   Focused Attention Appears intact   Memory Appears intact   Awareness Appears intact   Problem Solving Appears intact     Observation/Other Assessments   Observations obesity      Sensation   Additional Comments Denies parathesias      Coordination   Gross Motor Movements are Fluid and Coordinated Yes   Fine Motor Movements are Fluid and Coordinated Yes     Posture/Postural Control   Posture/Postural Control Postural limitations     AROM   Lumbar Flexion limited 25% with pain    Lumbar Extension No limit and improved pain    Lumbar - Left Side Bend pain with left side bend    Lumbar - Left Rotation pain with left rotation      PROM   Overall PROM Comments full PROM of bilateral shoulders; tight bilateral hip flexion      Strength   Overall Strength Comments bilateral shoulder strength 5/5; poor coor contraction    Strength Assessment Site Hip;Knee   Right/Left Hip Right;Left   Right Hip Flexion 4/5   Right Hip ABduction 4+/5   Left Hip Flexion 4/5   Left Hip ADduction 4+/5     Flexibility   Soft Tissue Assessment /Muscle Length yes   Hamstrings R45 degrees at 90/90 L 45 at 90/90      Palpation   Palpation comment tendernes to palpation into her left quadratus and tenderness to palpation around her floating ribs     Ambulation/Gait   Gait Comments decreased hip rotation and hip flexion                    [facility] Adult PT Treatment/Exercise - 10/07/16 0001      Lumbar Exercises: Standing   Other Standing Lumbar Exercises tennis ballk release to quadrsatus 2x30sec; sinck stretch 2x30 second holds      Lumbar Exercises: Seated   Other Seated Lumbar Exercises posterior capsule stretch with rotation 2x10; quadratus stretch 3x15 seconds to the right    Other Seated Lumbar Exercises seated hamstring stretch 3x20 sec each leg      Manual Therapy   Manual therapy comments trigger point release to the left quadratus                 PT Education - 10/07/16  45    Education provided Yes   Education Details HEP, symptom mangement. Sleep on the right side for now if able    Person(s) Educated Patient   Methods Explanation;Demonstration;Tactile cues;Verbal cues   Comprehension Verbalized understanding;Returned demonstration;Verbal cues required;Tactile cues required;Need further instruction          PT Short Term Goals - 10/07/16 1057      PT SHORT TERM GOAL #1   Title Patient will demsotrate a good core contraction    Time 4   Period Weeks   Status New     PT SHORT TERM GOAL #2   Title Patient will report decreased tenderness to palpation in her left flank    Time 4   Period Weeks   Status New     PT SHORT TERM GOAL #3   Title Patient will increase gross bilateral hip strength to 5/5    Time 4   Period Weeks   Status New     PT SHORT TERM GOAL #4   Title Patient will be indepdendent with initial HEP    Time 4   Period Weeks   Status New           PT Long Term Goals - 10/06/16 1202      PT LONG TERM GOAL #1   Title Patient will report lying supine for 4 hours without increased flank pain.   Time 6   Period Weeks   Status New     PT LONG TERM GOAL #2   Title Patient will be indepdent with program to promote core strength and stretching    Time 8   Period Weeks   Status New     PT LONG TERM GOAL #3   Title Patient will be independent with program that promotes core strength and hip mobility    Time 8   Period Weeks   Status New     PT LONG TERM GOAL #4   Title Patient will demsotrate a 38% limitation on FOTO    Time 8   Period Weeks   Status New               Plan - 10/07/16 1049    Clinical Impression Statement Patient is a 51 year old old female with chronic midline lower back pain and acute left flank pain. She presents with limited lumbar and hip flexion.  She has tenderness to palpation into her quadratus and into the floating ribs.  Signs and symptoms are consistent with quadratus/ rib strain. She had improved symptoms with spoft tissue mobilization and stretching. She will be seen for her left flank pain and would also benefit from her core stretching and strengthening    Rehab Potential Good   PT Frequency 2x / week   PT Duration 12 weeks   PT Treatment/Interventions ADLs/Self Care Home Management;Cryotherapy;Electrical Stimulation;Iontophoresis 22m/ml Dexamethasone;Moist Heat;Ultrasound;Fluidtherapy;Gait training;Stair training;Patient/family education;Therapeutic exercise;Therapeutic activities;Manual techniques;Passive range of motion;Dry needling;Splinting;Taping;Vasopneumatic Device   PT Next Visit Plan continue with soft tissue mobilization. Add in light core strengthening. use modalitires as needied. The patient has a high co-pay so she may only come for a few visits; reviewe other hip stretches    PT Home Exercise Plan Quadratus stretch, Rib/ stretch    Consulted and Agree with Plan of Care Patient      Patient will benefit from skilled therapeutic intervention in order to improve the following deficits and impairments:  Decreased endurance, Decreased range of motion, Increased fascial restricitons, Increased muscle  spasms, Decreased strength, Impaired flexibility  Visit Diagnosis: Chronic bilateral low back pain without sciatica - Plan: PT plan of care cert/re-cert  Muscle spasm of back - Plan: PT plan of care cert/re-cert  Muscle weakness (generalized) - Plan: PT plan of care cert/re-cert   PHYSICAL THERAPY DISCHARGE SUMMARY  Visits from Start of Care: 1  Current functional level related to goals / functional outcomes: Patient did not return  Remaining deficits: Unknown   Education / Equipment: Unknown  Plan: Patient agrees to discharge.  Patient goals were not met. Patient is being discharged due to not returning since the  last visit.  ?????       Problem List Patient Active Problem List   Diagnosis Date Noted  . Benign essential hypertension 11/05/2015  . Gastroesophageal reflux disease 11/05/2015  . Uncontrolled type 2 diabetes mellitus with diabetic polyneuropathy, with long-term current use of insulin ([place]) 11/05/2015  . Multinodular goiter 11/06/2013  . Obesity, Class III, BMI 40-49.9 (morbid obesity) ([place]) 05/23/2013  . 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 Diabetic neuropathy ([place]) 12/13/2011  . Iron deficiency anemia 12/09/2011  . Sleep apnea 12/03/2011  . Morbid obesity with BMI of 45.0-49.9, adult ([place]) 12/03/2011  . Chronic back pain 12/03/2011  . Hypertension 12/03/2011  . Depression 12/03/2011  . Appendicitis, acute 12/03/2011  . Diabetes mellitus type 2, insulin dependent ([place]) 12/03/2011  . DM (diabetes mellitus) ([place]) 10/06/2011  . HYPOGLYCEMIA 08/22/2010  . ALLERGIC RHINITIS CAUSE UNSPECIFIED 08/22/2010  . HYPERTHYROIDISM 06/27/2010  . DIABETES MELLITUS, TYPE II, UNCONTROLLED 06/27/2010  . DYSLIPIDEMIA 06/27/2010  . MORBID OBESITY 06/27/2010  . UNSPECIFIED ESSENTIAL HYPERTENSION 06/27/2010  . PERS HX NONCOMPLIANCE W/MED TX PRS HAZARDS HLTH 06/27/2010  . NONSPECIFIC ABN FINDING RAD & OTH EXAM GI TRACT 02/26/2009  . NEOPLASM UNCERTAIN BHV OTH&UNSPEC DIGESTIVE ORGN 02/26/2008    Carney Living PT DPT  10/07/2016, 12:46 PM  [facility] [address] [place], [place], [address] Phone: [phone]   Fax:  [phone]  Name: Kristina Adams MRN: [id number] Date of Birth: [birth year]

## 2016-10-08 ENCOUNTER — Other Ambulatory Visit: Payer: Self-pay | Admitting: Internal Medicine

## 2016-10-08 DIAGNOSIS — E049 Nontoxic goiter, unspecified: Secondary | ICD-10-CM

## 2016-10-14 ENCOUNTER — Telehealth (INDEPENDENT_AMBULATORY_CARE_PROVIDER_SITE_OTHER): Payer: Self-pay | Admitting: *Deleted

## 2016-10-14 NOTE — Telephone Encounter (Signed)
Patient called in this afternoon she would not tell me for what reason she would just like for either you or Dr. Louanne Skye to give her a call back if possible please? Her CB # [phone]. Thank you

## 2016-10-15 NOTE — Telephone Encounter (Signed)
Patient states that the meds are not really helping her that much but the one medication is making her drowsy.  She states that her note had her to go back to work today but is unable to do so and she needs to get that extended.

## 2016-10-20 ENCOUNTER — Other Ambulatory Visit (HOSPITAL_COMMUNITY)
Admission: RE | Admit: 2016-10-20 | Discharge: 2016-10-20 | Disposition: A | Discharge status: Home / Self Care | Payer: Federal, State, Local not specified - PPO | Source: Ambulatory Visit | Attending: Radiology | Admitting: Radiology

## 2016-10-20 ENCOUNTER — Encounter (INDEPENDENT_AMBULATORY_CARE_PROVIDER_SITE_OTHER): Payer: Self-pay | Admitting: Specialist

## 2016-10-20 ENCOUNTER — Ambulatory Visit
Admission: RE | Admit: 2016-10-20 | Discharge: 2016-10-20 | Disposition: A | Discharge status: Home / Self Care | Payer: Federal, State, Local not specified - PPO | Source: Ambulatory Visit | Attending: Internal Medicine | Admitting: Internal Medicine

## 2016-10-20 DIAGNOSIS — E041 Nontoxic single thyroid nodule: Secondary | ICD-10-CM | POA: Insufficient documentation

## 2016-10-20 DIAGNOSIS — E049 Nontoxic goiter, unspecified: Secondary | ICD-10-CM

## 2016-10-20 NOTE — Telephone Encounter (Signed)
I called and spoke to this patient and have extended her time out of work to allow return to work 10/25/2016. She reports that she has seen her primary care MD and some of the flank pain may perhaps relate to diabetes. jen

## 2016-10-20 NOTE — Telephone Encounter (Signed)
lmom that her note is ready for pick up at the front desk

## 2016-11-02 ENCOUNTER — Other Ambulatory Visit: Payer: Federal, State, Local not specified - PPO

## 2016-12-16 ENCOUNTER — Other Ambulatory Visit (INDEPENDENT_AMBULATORY_CARE_PROVIDER_SITE_OTHER): Payer: Self-pay | Admitting: Specialist

## 2016-12-16 NOTE — Telephone Encounter (Signed)
Meloxicam refill request

## 2017-01-28 ENCOUNTER — Telehealth (INDEPENDENT_AMBULATORY_CARE_PROVIDER_SITE_OTHER): Payer: Self-pay | Admitting: Specialist

## 2017-01-28 NOTE — Telephone Encounter (Signed)
Requesting apt work in asap with Nitka-c/o soreness in right knee

## 2017-02-04 NOTE — Telephone Encounter (Signed)
I put pt on cancellation list.  Will call once something opens up.

## 2017-02-07 ENCOUNTER — Telehealth (INDEPENDENT_AMBULATORY_CARE_PROVIDER_SITE_OTHER): Payer: Self-pay | Admitting: Specialist

## 2017-02-07 NOTE — Telephone Encounter (Signed)
Called patient left message to return call to schedule an appointment tomorrow with Dr Louanne Skye  For knee soreness

## 2017-02-08 ENCOUNTER — Ambulatory Visit (INDEPENDENT_AMBULATORY_CARE_PROVIDER_SITE_OTHER): Payer: Federal, State, Local not specified - PPO | Admitting: Specialist

## 2017-02-15 ENCOUNTER — Ambulatory Visit (INDEPENDENT_AMBULATORY_CARE_PROVIDER_SITE_OTHER): Payer: Federal, State, Local not specified - PPO

## 2017-02-15 ENCOUNTER — Encounter (INDEPENDENT_AMBULATORY_CARE_PROVIDER_SITE_OTHER): Payer: Self-pay | Admitting: Specialist

## 2017-02-15 ENCOUNTER — Ambulatory Visit (INDEPENDENT_AMBULATORY_CARE_PROVIDER_SITE_OTHER): Payer: Federal, State, Local not specified - PPO | Admitting: Specialist

## 2017-02-15 VITALS — BP 141/82 | HR 88 | Ht 70.0 in | Wt 303.0 lb

## 2017-02-15 DIAGNOSIS — M25561 Pain in right knee: Secondary | ICD-10-CM | POA: Diagnosis not present

## 2017-02-15 DIAGNOSIS — G8929 Other chronic pain: Secondary | ICD-10-CM

## 2017-02-15 NOTE — Progress Notes (Signed)
Office Visit Note   Patient: Kristina Adams           Date of Birth: 1965/08/24           MRN: [id number] Visit Date: 02/15/2017              Requested by: Fanny Bien, [address] [place], [place] [address] PCP: [name], MD   Assessment & Plan: Visit Diagnoses:  1. Chronic pain of right knee     Plan: Patient has history of diabetes. Fingerstick glucose today in clinic 235.  I was planning conservative treatment with intra-articular Marcaine/Depo-Medrol injection but advised patient that with this number that injection could be a problem. States that she has not use insulin today. Advised patient to strictly monitor her blood sugars and keep them under control over the next couple days and she will return to the clinic Thursday morning for possible injection at that time. Advised that her blood sugars must be lower. Voices understanding. All questions answered. If her blood sugars are still elevated may consider getting an MRI scan to evaluate the extent of her arthritis and also rule out possible medial meniscus tear that is likely contributing to mechanical symptoms. Patient was given her a note keeping her out of work today until Friday.  Follow-Up Instructions: Return in about 2 days (around 02/17/2017) for Cortisone injection with Jeneen Rinks 830.   Orders:  Orders Placed This Encounter  Procedures  . XR KNEE 3 VIEW RIGHT   No orders of the defined types were placed in this encounter.     Procedures: No procedures performed   Clinical Data: No additional findings.   Subjective: Chief Complaint  Patient presents with  . Right Knee - Pain    HPI Patient comes in today with complaints of right knee pain. Off-and-on pain for several years but this has been worse over the last couple weeks. Denies injury. Pain aggravated with twisting bending squatting. Describes having some feeling of catching and popping. Some swelling.  Patient worse with the states  pulses service. Try conservative treatment with oral NSAIDs, ice and muscle rub over-the-counter. Review of Systems No current cardiac pulmonary GI GU issues.  Objective: Vital Signs: BP (!) 141/82   Pulse 88   Ht 5\' 10"  (1.778 m)   Wt (!) 303 lb (137.4 kg)   BMI 43.48 kg/m   Physical Exam  Constitutional: No distress.  HENT:  Head: Normocephalic and atraumatic.  Eyes: Pupils are equal, round, and reactive to light. EOM are normal.  Neck: Normal range of motion.  Pulmonary/Chest: No respiratory distress.  Abdominal: She exhibits no distension.  Musculoskeletal:  Right knee positive crepitus. Some swelling without large effusion. Range of motion about 0-120. Medial greater than lateral joint line tenderness. Positive McMurray's test. Calf Nontender. Neurovascularly intact. Skin order.  Skin: Skin is warm and dry.  Psychiatric: She has a normal mood and affect.    Ortho Exam  Specialty Comments:  No specialty comments available.  Imaging: No results found.   PMFS History: Patient Active Problem List   Diagnosis Date Noted  . Benign essential hypertension 11/05/2015  . Gastroesophageal reflux disease 11/05/2015  . Uncontrolled type 2 diabetes mellitus with diabetic polyneuropathy, with long-term current use of insulin ([place]) 11/05/2015  . Multinodular goiter 11/06/2013  . Obesity, Class III, BMI 40-49.9 (morbid obesity) ([place]) 05/23/2013  . 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 Diabetic neuropathy ([place]) 12/13/2011  . Iron deficiency anemia  12/09/2011  . Sleep apnea 12/03/2011  . Morbid obesity with BMI of 45.0-49.9, adult ([place]) 12/03/2011  . Chronic back pain 12/03/2011  . Hypertension 12/03/2011  . Depression 12/03/2011  . Appendicitis, acute 12/03/2011  . Diabetes mellitus type 2, insulin dependent ([place]) 12/03/2011  . DM (diabetes mellitus) ([place]) 10/06/2011  . HYPOGLYCEMIA 08/22/2010  . ALLERGIC RHINITIS CAUSE UNSPECIFIED 08/22/2010  . HYPERTHYROIDISM  06/27/2010  . DIABETES MELLITUS, TYPE II, UNCONTROLLED 06/27/2010  . DYSLIPIDEMIA 06/27/2010  . MORBID OBESITY 06/27/2010  . UNSPECIFIED ESSENTIAL HYPERTENSION 06/27/2010  . PERS HX NONCOMPLIANCE W/MED TX PRS HAZARDS HLTH 06/27/2010  . NONSPECIFIC ABN FINDING RAD & OTH EXAM GI TRACT 02/26/2009  . NEOPLASM UNCERTAIN BHV OTH&UNSPEC DIGESTIVE ORGN 02/26/2008   Past Medical History:  Diagnosis Date  . Anemia 12/09/2011   child birth  . Appendicitis, acute 12/03/2011  . Blood transfusion   . Chronic back pain 12/03/2011  . DDD (degenerative disc disease)   . Degenerative disc disease, cervical    low back area  . Diabetes mellitus   . Diabetes mellitus type 2, insulin dependent ([place]) 12/03/2011  . GERD (gastroesophageal reflux disease)   . H/O hiatal hernia   . Headache(784.0)   . Hypertension    sees Dr. Darron Doom, [facility], [place]  . Hypertension 12/03/2011  . Morbid obesity with BMI of 45.0-49.9, adult ([place]) 12/03/2011  . Sleep apnea 12/03/2011   last sleep study May 2013  . Thyroid disease   . Urinary tract bacterial infections    hx of    Family History  Problem Relation Age of Onset  . Cancer Mother 57       breast cancer   . Diabetes Other   . Cancer Other   . Hypertension Other     Past Surgical History:  Procedure Laterality Date  . APPENDECTOMY    . CESAREAN SECTION    . LAPAROSCOPIC APPENDECTOMY  12/03/2011   Procedure: APPENDECTOMY LAPAROSCOPIC;  Surgeon: [name], DO;  Location: [facility];  Service: General;  Laterality: N/A;  drainage of intra-abdominal abscess   . PARS PLANA VITRECTOMY Left 08/17/2012   Procedure: PARS PLANA VITRECTOMY WITH 25 GAUGE;  Surgeon: [name], MD;  Location: [facility];  Service: Ophthalmology;  Laterality: Left;  . 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   Procedure: REPAIR OF COMPLEX TRACTION RETINAL DETACHMENT;  Surgeon: [name], MD;  Location: [facility];  Service: Ophthalmology;   Laterality: Left;  . TUBAL LIGATION    . VITRECTOMY     Social History   Occupational History  . Not on file.   Social History Main Topics  . Smoking status: Never Smoker  . Smokeless tobacco: Never Used  . Alcohol use 0.6 oz/week    1 Glasses of wine per week  . Drug use: No  . Sexual activity: Yes    Birth control/ protection: Pill

## 2017-02-15 NOTE — Progress Notes (Signed)
XR

## 2017-02-17 ENCOUNTER — Ambulatory Visit (INDEPENDENT_AMBULATORY_CARE_PROVIDER_SITE_OTHER): Payer: Federal, State, Local not specified - PPO | Admitting: Specialist

## 2017-04-20 NOTE — Progress Notes (Signed)
No show  This encounter was created in error - please disregard.

## 2017-04-21 ENCOUNTER — Other Ambulatory Visit: Payer: Federal, State, Local not specified - PPO

## 2017-04-21 ENCOUNTER — Encounter: Payer: Federal, State, Local not specified - PPO | Admitting: Hematology

## 2017-04-22 ENCOUNTER — Telehealth: Payer: Self-pay | Admitting: Hematology

## 2017-04-22 NOTE — Telephone Encounter (Signed)
Left message for patient regarding r/s of 10/11 appt - per sch message only schedule the appt if she agrees to come - message was left to call back if patient wants to f/u

## 2017-04-27 ENCOUNTER — Telehealth (INDEPENDENT_AMBULATORY_CARE_PROVIDER_SITE_OTHER): Payer: Self-pay | Admitting: Specialist

## 2017-04-27 NOTE — Telephone Encounter (Signed)
Patient called wanting to know if it is okay to take her Meloxicam 15mg  b.i.d, she wants to make sure it will not do anything to her kidneys.  CB#[phone].  Thank you

## 2017-04-27 NOTE — Telephone Encounter (Signed)
Patient called wanting to know if it is okay to take her Meloxicam 15mg  b.i.d, she wants to make sure it will not do anything to her kidneys.  CB#[phone].  Thank you

## 2017-04-28 ENCOUNTER — Other Ambulatory Visit (INDEPENDENT_AMBULATORY_CARE_PROVIDER_SITE_OTHER): Payer: Self-pay | Admitting: Specialist

## 2017-04-28 NOTE — Telephone Encounter (Signed)
Her last recorded kidney function blood test was normal from 2015. The last prescription was to take meloxicam 15 mg One time per day. I do not recommend taking this medication more than one time per day. She has a history of diabetes and patients with diabetes have a higher risk of kidney side effects compared with patients not having diabetes, I only recommend she use this medication once per day. Her primary care physician should monitor her Kidney and liver functions while on these meds and in general. Kristina Adams

## 2017-04-28 NOTE — Telephone Encounter (Signed)
Tried to call but call would not go thru

## 2017-04-29 NOTE — Telephone Encounter (Signed)
I called and advised only 1 Meloxicam 15 mg Daily is all she can take.  ---She wants to know what else can be rx'd for her pain that is stronger.  She says 1 meloxicam a day is NOT helping her.  She wants something ASAP. Thanks--Please advise.

## 2017-05-02 ENCOUNTER — Other Ambulatory Visit (INDEPENDENT_AMBULATORY_CARE_PROVIDER_SITE_OTHER): Payer: Self-pay | Admitting: Specialist

## 2017-05-02 MED ORDER — TRAMADOL-ACETAMINOPHEN 37.5-325 MG PO TABS: 1.0000 | ORAL_TABLET | Freq: Four times a day (QID) | ORAL | 0 refills | Status: DC | PRN

## 2017-05-02 NOTE — Telephone Encounter (Signed)
Has taken diclofenac before and meloxicam. I am concerned that NSAIDs are not able  To relieve her pain,  WIll order  Ultacet, no stronger narcotics without considering surgical treatment.

## 2017-05-03 NOTE — Telephone Encounter (Signed)
I called rx to [facility]. Patient is aware.

## 2017-06-07 ENCOUNTER — Encounter: Payer: Self-pay | Admitting: Cardiology

## 2017-06-07 ENCOUNTER — Ambulatory Visit: Payer: Federal, State, Local not specified - PPO | Admitting: Cardiology

## 2017-06-07 VITALS — BP 130/40 | HR 38 | Ht 70.0 in | Wt 325.6 lb

## 2017-06-07 DIAGNOSIS — R001 Bradycardia, unspecified: Secondary | ICD-10-CM | POA: Diagnosis not present

## 2017-06-07 DIAGNOSIS — R002 Palpitations: Secondary | ICD-10-CM

## 2017-06-07 DIAGNOSIS — I952 Hypotension due to drugs: Secondary | ICD-10-CM

## 2017-06-07 NOTE — Progress Notes (Signed)
Cardiology Office Note   Date:  06/07/2017   ID:  Kristina Adams, DOB 1965-08-20, MRN [id number]  PCP:  [name], MD  Cardiologist:   Minus Breeding, MD  Referring:  [name], MD    Chief Complaint  Patient presents with  . Palpitations      History of Present Illness: Kristina Adams is a 51 y.o. female who is referred by [name], MD for evaluation of  palpitations.  She saw her primary care provider because she was having complaints of dizziness.  She was having some headaches around her eyes and around the back of her head.  She also felt her heart thumping in her head.  She described it as a stronger beat but not necessarily irregular.  I feel this in her chest as well.  It happened probably every day or so for at least the last couple of weeks and maybe a few months.  It would happen at rest or with activity but she could not make it come on.  She did not have any syncope or presyncope.  She did describe some dizziness occasionally when changing positions in her job at the [employer].  She has not had any chest pressure, neck or arm discomfort.  She has not had any new shortness of breath, PND or orthopnea.  She is never had any prior cardiac workup.   Past Medical History:  Diagnosis Date  . Anemia 12/09/2011   child birth  . Appendicitis, acute 12/03/2011  . Chronic back pain 12/03/2011  . Degenerative disc disease, cervical    low back area  . Diabetes mellitus type 2, insulin dependent ([place]) 12/03/2011  . GERD (gastroesophageal reflux disease)   . H/O hiatal hernia   . Hypertension 12/03/2011  . Morbid obesity with BMI of 45.0-49.9, adult ([place] [facility]) 12/03/2011  . Sleep apnea 12/03/2011   last sleep study May 2013  . Thyroid disease     Past Surgical History:  Procedure Laterality Date  . CESAREAN SECTION    . LAPAROSCOPIC APPENDECTOMY  12/03/2011   Procedure: APPENDECTOMY LAPAROSCOPIC;  Surgeon: [name], DO;  Location: [facility];  Service: General;   Laterality: N/A;  drainage of intra-abdominal abscess   . PARS PLANA VITRECTOMY Left 08/17/2012   Procedure: PARS PLANA VITRECTOMY WITH 25 GAUGE;  Surgeon: [name], MD;  Location: [facility];  Service: Ophthalmology;  Laterality: Left;  . 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   Procedure: REPAIR OF COMPLEX TRACTION RETINAL DETACHMENT;  Surgeon: [name], MD;  Location: [facility];  Service: Ophthalmology;  Laterality: Left;  . TUBAL LIGATION    . VITRECTOMY       Current Outpatient Medications  Medication Sig Dispense Refill  . Cholecalciferol (VITAMIN D3) 5000 units CAPS Take 5,000 Units by mouth daily.    . cyclobenzaprine (FLEXERIL) 10 MG tablet Take 10 mg by mouth 3 (three) times daily as needed for muscle spasms.     Marland Kitchen glyBURIDE (DIABETA) 5 MG tablet Take 5 mg by mouth daily.    . hydrochlorothiazide (HYDRODIURIL) 25 MG tablet Take 25 mg by mouth daily.    . Insulin Glargine (LANTUS) 100 UNIT/ML Solostar Pen Inject 80 Units into the skin daily.    Marland Kitchen lisinopril (PRINIVIL,ZESTRIL) 40 MG tablet Take 40 mg by mouth daily.    . meloxicam (MOBIC) 15 MG tablet Take 15 mg by mouth as needed for pain (AND INFLAMATION).    Marland Kitchen  methimazole (TAPAZOLE) 5 MG tablet Take 10 mg by mouth daily.    . Multiple Vitamin (MULITIVITAMIN WITH MINERALS) TABS Take 1 tablet by mouth daily.    . pioglitazone (ACTOS) 30 MG tablet     . traMADol-acetaminophen (ULTRACET) 37.5-325 MG tablet Take 1 tablet by mouth every 6 (six) hours as needed for moderate pain. 30 tablet 0  . vitamin C (ASCORBIC ACID) 500 MG tablet Take 1,000 mg by mouth daily.     Current Facility-Administered Medications  Medication Dose Route Frequency Provider Last Rate Last Dose  . lidocaine (PF) (XYLOCAINE) 1 % injection 0.3 mL  0.3 mL Other Once Magnus Sinning, MD      . methylPREDNISolone acetate (DEPO-MEDROL) injection 80 mg  80 mg Other Once Magnus Sinning, MD        Allergies:   Latex and Iohexol     Social History:  The patient  reports that  has never smoked. she has never used smokeless tobacco. She reports that she drinks about 0.6 oz of alcohol per week. She reports that she does not use drugs.   Family History:  The patient's family history includes Cancer in her other; Cancer (age of onset: 72) in her mother; Diabetes in her other; Heart disease in her father; Hypertension in her other; Kidney failure in her father.    ROS:  Please see the history of present illness.   Otherwise, review of systems are positive for none.   All other systems are reviewed and negative.    PHYSICAL EXAM: VS:  BP (!) 130/40 (BP Location: Right Arm)   Pulse (!) 38   Ht 5' 10"  (1.778 m)   Wt (!) 325 lb 9.6 oz (147.7 kg)   SpO2 98%   BMI 46.72 kg/m  , BMI Body mass index is 46.72 kg/m. GENERAL:  Well appearing HEENT:  Pupils equal round and reactive, fundi not visualized, oral mucosa unremarkable NECK:  No jugular venous distention, waveform within normal limits, carotid upstroke brisk and symmetric, no bruits, no thyromegaly, positive goiter LYMPHATICS:  No cervical, inguinal adenopathy LUNGS:  Clear to auscultation bilaterally BACK:  No CVA tenderness CHEST:  Unremarkable HEART:  PMI not displaced or sustained,S1 and S2 within normal limits, no S3, no S4, no clicks, no rubs, no murmurs ABD:  Flat, positive bowel sounds normal in frequency in pitch, no bruits, no rebound, no guarding, no midline pulsatile mass, no hepatomegaly, no splenomegaly EXT:  2 plus pulses throughout, no edema, no cyanosis no clubbing SKIN:  No rashes no nodules NEURO:  Cranial nerves II through XII grossly intact, motor grossly intact throughout PSYCH:  Cognitively intact, oriented to person place and time    EKG:  EKG is not ordered today. The ekg ordered 06/06/17 sinus rhythm, with PVCs in a trigeminal pattern poor anterior R wave progression, axis within normal limits, within normal limits, no acute T wave  changes.    Recent Labs: No results found for requested labs within last 8760 hours.    Lipid Panel No results found for: CHOL, TRIG, HDL, CHOLHDL, VLDL, LDLCALC, LDLDIRECT    Wt Readings from Last 3 Encounters:  06/07/17 (!) 325 lb 9.6 oz (147.7 kg)  02/15/17 (!) 303 lb (137.4 kg)  10/04/16 (!) 330 lb (149.7 kg)      Other studies Reviewed: Additional studies/ records that were reviewed today include: Outside EKG. Review of the above records demonstrates:  Please see elsewhere in the note.     ASSESSMENT AND PLAN:  PVCs: I am going to apply a 48-hour Holter to make sure that this is the only dysrhythmias she has.  She has blood work pending which include to be met in TSH and she will get these results tomorrow.  I will have a low threshold for an echocardiogram and then she and I will discuss potential treatment for symptomatic PVCs versus expectant management.  ORTHOSTATIC BP: She did have a mild drop in her blood pressure with positional change.  However, this was significantly talked about hydration and I would not think further therapy was indicated at this point.  SLEEP APNEA:   she had this diagnosis but she never had follow-up therapy for her and I encouraged her to follow-up with this.  She does complain of fatigue.  Current medicines are reviewed at length with the patient today.  The patient does not have concerns regarding medicines.  The following changes have been made:  no change  Labs/ tests ordered today include:   Orders Placed This Encounter  Procedures  . HOLTER MONITOR - 48 HOUR     Disposition:   FU with me based on the results of the above testing.     Signed, Minus Breeding, MD  06/07/2017 10:28 PM    [place]

## 2017-06-07 NOTE — Patient Instructions (Signed)
Medication Instructions:  Continue current medications  If you need a refill on your cardiac medications before your next appointment, please call your pharmacy.  Labwork: None Ordered    Testing/Procedures: Your physician has recommended that you wear a 48 hour holter monitor. Holter monitors are medical devices that record the heart's electrical activity. Doctors most often use these monitors to diagnose arrhythmias. Arrhythmias are problems with the speed or rhythm of the heartbeat. The monitor is a small, portable device. You can wear one while you do your normal daily activities. This is usually used to diagnose what is causing palpitations/syncope (passing out).   Follow-Up: Your physician wants you to follow-up in: As Needed.    Thank you for choosing [facility] at [facility]!!

## 2017-07-29 ENCOUNTER — Encounter: Payer: Self-pay | Admitting: Cardiology

## 2017-08-10 ENCOUNTER — Other Ambulatory Visit: Payer: Self-pay | Admitting: Family Medicine

## 2017-08-10 DIAGNOSIS — N950 Postmenopausal bleeding: Secondary | ICD-10-CM

## 2017-09-26 ENCOUNTER — Other Ambulatory Visit: Payer: Federal, State, Local not specified - PPO

## 2017-09-29 ENCOUNTER — Other Ambulatory Visit: Payer: Federal, State, Local not specified - PPO

## 2017-10-06 ENCOUNTER — Ambulatory Visit: Payer: Federal, State, Local not specified - PPO | Admitting: Nurse Practitioner

## 2017-10-06 ENCOUNTER — Ambulatory Visit: Payer: Federal, State, Local not specified - PPO

## 2017-10-06 ENCOUNTER — Other Ambulatory Visit: Payer: Federal, State, Local not specified - PPO

## 2017-10-07 ENCOUNTER — Inpatient Hospital Stay: Payer: Federal, State, Local not specified - PPO

## 2017-10-07 ENCOUNTER — Inpatient Hospital Stay: Payer: Federal, State, Local not specified - PPO | Attending: Hematology

## 2017-10-07 ENCOUNTER — Encounter: Payer: Self-pay | Admitting: Nurse Practitioner

## 2017-10-07 ENCOUNTER — Telehealth: Payer: Self-pay

## 2017-10-07 ENCOUNTER — Inpatient Hospital Stay (HOSPITAL_BASED_OUTPATIENT_CLINIC_OR_DEPARTMENT_OTHER): Payer: Federal, State, Local not specified - PPO | Admitting: Nurse Practitioner

## 2017-10-07 ENCOUNTER — Telehealth: Payer: Self-pay | Admitting: Nurse Practitioner

## 2017-10-07 VITALS — BP 147/62 | HR 74 | Temp 98.2°F | Resp 18 | Ht 70.0 in | Wt 328.4 lb

## 2017-10-07 DIAGNOSIS — N920 Excessive and frequent menstruation with regular cycle: Secondary | ICD-10-CM | POA: Diagnosis not present

## 2017-10-07 DIAGNOSIS — Z794 Long term (current) use of insulin: Secondary | ICD-10-CM | POA: Diagnosis not present

## 2017-10-07 DIAGNOSIS — G4730 Sleep apnea, unspecified: Secondary | ICD-10-CM | POA: Insufficient documentation

## 2017-10-07 DIAGNOSIS — D508 Other iron deficiency anemias: Secondary | ICD-10-CM

## 2017-10-07 DIAGNOSIS — R42 Dizziness and giddiness: Secondary | ICD-10-CM | POA: Insufficient documentation

## 2017-10-07 DIAGNOSIS — D500 Iron deficiency anemia secondary to blood loss (chronic): Secondary | ICD-10-CM

## 2017-10-07 DIAGNOSIS — E119 Type 2 diabetes mellitus without complications: Secondary | ICD-10-CM | POA: Diagnosis not present

## 2017-10-07 DIAGNOSIS — K219 Gastro-esophageal reflux disease without esophagitis: Secondary | ICD-10-CM | POA: Diagnosis not present

## 2017-10-07 DIAGNOSIS — R51 Headache: Secondary | ICD-10-CM

## 2017-10-07 DIAGNOSIS — Z79899 Other long term (current) drug therapy: Secondary | ICD-10-CM | POA: Insufficient documentation

## 2017-10-07 DIAGNOSIS — I10 Essential (primary) hypertension: Secondary | ICD-10-CM | POA: Insufficient documentation

## 2017-10-07 DIAGNOSIS — Z6841 Body Mass Index (BMI) 40.0 and over, adult: Secondary | ICD-10-CM | POA: Diagnosis not present

## 2017-10-07 DIAGNOSIS — K449 Diaphragmatic hernia without obstruction or gangrene: Secondary | ICD-10-CM | POA: Insufficient documentation

## 2017-10-07 DIAGNOSIS — E0590 Thyrotoxicosis, unspecified without thyrotoxic crisis or storm: Secondary | ICD-10-CM | POA: Insufficient documentation

## 2017-10-07 LAB — CBC WITH DIFFERENTIAL (CANCER CENTER ONLY)
Basophils Absolute: 0.1 10*3/uL (ref 0.0–0.1)
Basophils Relative: 1 %
EOS ABS: 0.2 10*3/uL (ref 0.0–0.5)
EOS PCT: 4 %
HCT: 31.1 % — ABNORMAL LOW (ref 34.8–46.6)
HEMOGLOBIN: 9.7 g/dL — AB (ref 11.6–15.9)
LYMPHS PCT: 34 %
Lymphs Abs: 2 10*3/uL (ref 0.9–3.3)
MCH: 26.7 pg (ref 25.1–34.0)
MCHC: 31.2 g/dL — AB (ref 31.5–36.0)
MCV: 85.7 fL (ref 79.5–101.0)
MONOS PCT: 9 %
Monocytes Absolute: 0.5 10*3/uL (ref 0.1–0.9)
NEUTROS PCT: 52 %
Neutro Abs: 3.2 10*3/uL (ref 1.5–6.5)
Platelet Count: 296 10*3/uL (ref 145–400)
RBC: 3.63 MIL/uL — ABNORMAL LOW (ref 3.70–5.45)
RDW: 15.6 % — ABNORMAL HIGH (ref 11.2–14.5)
WBC Count: 6 10*3/uL (ref 3.9–10.3)

## 2017-10-07 LAB — IRON AND TIBC
IRON: 34 ug/dL — AB (ref 41–142)
Saturation Ratios: 13 % — ABNORMAL LOW (ref 21–57)
TIBC: 273 ug/dL (ref 236–444)
UIBC: 238 ug/dL

## 2017-10-07 LAB — FERRITIN: Ferritin: 127 ng/mL (ref 9–269)

## 2017-10-07 LAB — RETICULOCYTES
RBC.: 3.63 MIL/uL — ABNORMAL LOW (ref 3.70–5.45)
RETIC CT PCT: 0.9 % (ref 0.7–2.1)
Retic Count, Absolute: 32.7 10*3/uL — ABNORMAL LOW (ref 33.7–90.7)

## 2017-10-07 MED ORDER — SODIUM CHLORIDE 0.9 % IV SOLN
510.0000 mg | Freq: Once | INTRAVENOUS | Status: AC
  Administered 2017-10-07: 510 mg via INTRAVENOUS
  Filled 2017-10-07: qty 17

## 2017-10-07 NOTE — Telephone Encounter (Signed)
Scheduled apt per 3/29 los - Gave patient AVS and calender per los.

## 2017-10-07 NOTE — Telephone Encounter (Signed)
Called to request pt PCP fax most recent labs. Spoke with Roselyn Reef

## 2017-10-07 NOTE — Patient Instructions (Signed)

## 2017-10-07 NOTE — Progress Notes (Signed)
[place]  [phone] [phone]  Clinic Follow up Note   Patient Care Team: Kristina Rasmussen, Adams as PCP - General (Family Medicine) 10/07/2017  DIAGNOSIS: Iron deficiency anemia   TREATMENT: IVFeraheme as needed, received in 07/2014, 09/2014, 04/2016  INTERVAL HISTORY: [name] returns for follow-up iron deficiency anemia.  He was last seen by Kristina Adams 04/30/2016 with IV Feraheme infusion.  She was instructed to follow-up with labs in 6 months and office visit in 1 year but canceled the appointments.  Today she reports not feeling well lately with increased fatigue and intermittent left frontal headache.  She had 3-week period of intermittent dizziness she attributed to vertigo. Resolved with meclizine recommended by PCP, no dizziness in the last week.  PCP drew labs and was told her iron was low.  She believes she is perimenopausal, LMP 08/01/2017 lasting 6 days, heavy for 2 days requires product change every 2 hours.  Last menses prior to that was 01/28/2017 when they were occurring every 23-25 days at that time.  Denies rectal bleeding or blood in stool. She denies other changes in her health overall.   REVIEW OF SYSTEMS:   Constitutional: Denies fevers, chills or abnormal weight loss (+) normal appetite (+) increased fatigue  Eyes: Denies blurriness of vision Ears, nose, mouth, throat, and face: Denies mucositis or sore throat Respiratory: Denies cough, dyspnea or wheezes Cardiovascular: Denies palpitation, chest discomfort or lower extremity swelling Gastrointestinal:  Denies vomiting, diarrhea, hematochezia, heartburn or change in bowel habits (+) intermittent mild nausea during menses (+) occasional constipation  Skin: Denies abnormal skin rashes Endocrine: (+) ? Perimenopausal (+) LMP 08/01/2017 and 01/2017 previously  Lymphatics: Denies new lymphadenopathy or easy bruising Neurological:Denies numbness, tingling or new weaknesses (+) occasional  left frontal headache (+) 3-wk episode of intermittent dizziness, resolved with meclizine  Behavioral/Psych: (+) emotional lability  All other systems were reviewed with the patient and are negative.  MEDICAL HISTORY:  Past Medical History:  Diagnosis Date  . Anemia 12/09/2011   child birth  . Appendicitis, acute 12/03/2011  . Chronic back pain 12/03/2011  . Degenerative disc disease, cervical    low back area  . Diabetes mellitus type 2, insulin dependent ([place]) 12/03/2011  . GERD (gastroesophageal reflux disease)   . H/O hiatal hernia   . Hypertension 12/03/2011  . Morbid obesity with BMI of 45.0-49.9, adult ([place]) 12/03/2011  . Sleep apnea 12/03/2011   last sleep study May 2013  . Thyroid disease     SURGICAL HISTORY: Past Surgical History:  Procedure Laterality Date  . CESAREAN SECTION    . LAPAROSCOPIC APPENDECTOMY  12/03/2011   Procedure: APPENDECTOMY LAPAROSCOPIC;  Surgeon: Kristina Adams;  Location: [facility];  Service: General;  Laterality: N/A;  drainage of intra-abdominal abscess   . PARS PLANA VITRECTOMY Left 08/17/2012   Procedure: PARS PLANA VITRECTOMY WITH 25 GAUGE;  Surgeon: Kristina Adams;  Location: [facility];  Service: Ophthalmology;  Laterality: Left;  . 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 TUBAL LIGATION    . VITRECTOMY      I have reviewed the social history and family history with the patient and they are unchanged from previous note.  ALLERGIES:  is allergic to latex and iohexol.  MEDICATIONS:  Current Outpatient Medications  Medication Sig Dispense Refill  .  Cholecalciferol (VITAMIN D3) 5000 units CAPS Take 5,000 Units by mouth daily.    . cyclobenzaprine (FLEXERIL) 10 MG tablet Take 10 mg by mouth 3 (three) times daily as needed for muscle spasms.     . fenofibrate 160 MG tablet      . fluticasone (FLONASE) 50 MCG/ACT nasal spray fluticasone propionate 50 mcg/actuation nasal spray,suspension    . gabapentin (NEURONTIN) 300 MG capsule     . hydrochlorothiazide (HYDRODIURIL) 25 MG tablet Take 25 mg by mouth daily.    Marland Kitchen lisinopril (PRINIVIL,ZESTRIL) 40 MG tablet Take 40 mg by mouth daily.    . metFORMIN (GLUCOPHAGE-XR) 500 MG 24 hr tablet     . methimazole (TAPAZOLE) 5 MG tablet Take 10 mg by mouth daily.    . Multiple Vitamin (MULITIVITAMIN WITH MINERALS) TABS Take 1 tablet by mouth daily.    . pioglitazone (ACTOS) 30 MG tablet     . traMADol-acetaminophen (ULTRACET) 37.5-325 MG tablet Take 1 tablet by mouth every 6 (six) hours as needed for moderate pain. 30 tablet 0  . vitamin C (ASCORBIC ACID) 500 MG tablet Take 1,000 mg by mouth daily.    . Insulin Glargine (LANTUS) 100 UNIT/ML Solostar Pen Inject 80 Units into the skin daily.     Current Facility-Administered Medications  Medication Dose Route Frequency Provider Last Rate Last Dose  . lidocaine (PF) (XYLOCAINE) 1 % injection 0.3 mL  0.3 mL Other Once Kristina Adams      . methylPREDNISolone acetate (DEPO-MEDROL) injection 80 mg  80 mg Other Once Kristina Adams        PHYSICAL EXAMINATION: ECOG PERFORMANCE STATUS: 1 - Symptomatic but completely ambulatory  Vitals:   10/07/17 1036  BP: (!) 147/62  Pulse: 74  Resp: 18  Temp: 98.2 F (36.8 C)  SpO2: 100%   Filed Weights   10/07/17 1036  Weight: (!) 328 lb 6.4 oz (149 kg)    GENERAL:alert, no distress and comfortable SKIN: skin color, texture, turgor are normal, no rashes or significant lesions EYES: normal, Conjunctiva are pink and non-injected, sclera clear OROPHARYNX:no exudate, no erythema and lips, buccal mucosa, and tongue normal  NECK: supple, (+) enlarged non-tender thyroid  LYMPH:  no palpable cervical, supraclavicular, or axillary lymphadenopathy LUNGS: clear to auscultation  with normal breathing effort HEART: regular rate & rhythm  and no murmurs and no lower extremity edema ABDOMEN:abdomen soft, non-tender and normal bowel sounds (+) obese Musculoskeletal:no cyanosis of digits and no clubbing  NEURO: alert & oriented x 3 with fluent speech, no focal motor/sensory deficits  LABORATORY DATA:  I have reviewed the data as listed CBC Latest Ref Rng & Units 10/07/2017 05/04/2016 04/21/2016  WBC 3.9 - 10.3 K/uL 6.0 6.1 7.8  Hemoglobin 11.6 - 15.9 g/dL - 10.9(L) 11.6  Hematocrit 34.8 - 46.6 % 31.1(L) 33.6(L) 35.8  Platelets 145 - 400 K/uL 296 291 299     CMP Latest Ref Rng & Units 07/08/2014 08/17/2012 12/09/2011  Glucose 70 - 140 mg/dl 239(H) 139(H) 218(H)  BUN 7.0 - 26.0 mg/dL 13.4 16 5(L)  Creatinine 0.6 - 1.1 mg/dL 0.8 0.64 0.65  Sodium 136 - 145 mEq/L 138 138 135  Potassium 3.5 - 5.1 mEq/L 4.6 3.7 4.7  Chloride 96 - 112 mEq/L - 101 99  CO2 22 - 29 mEq/L 28 28 27   Calcium 8.4 - 10.4 mg/dL 8.8 9.2 8.6  Total Protein 6.4 - 8.3 g/dL 6.8 - 6.1  Total Bilirubin 0.20 - 1.20 mg/dL 0.20 - 0.1(L)  Alkaline Phos 40 - 150 U/L 63 - 62  AST 5 - 34 U/L 10 - 38(H)  ALT 0 - 55 U/L 13 - 17   Results for BOBBYJO, MARULANDA (MRN [id number]) as of 04/25/2016 15:26  Ref. Range 08/18/2015 10:03 04/21/2016 05/04/2016 10/07/17  Iron Latest Ref Range: 41 - 142 ug/dL 41 44 26 34  UIBC Latest Ref Range: 120 - 384 ug/dL 211 231 220 238  TIBC Latest Ref Range: 236 - 444 ug/dL 253 275 245 237  %SAT Latest Ref Range: 21 - 57 % 16 (L) 16 (L) 10 (L) 13 (L)  Ferritin Latest Ref Range: 9 - 269 ng/ml 137 100 65 127      RADIOGRAPHIC STUDIES: I have personally reviewed the radiological images as listed and agreed with the findings in the report. No results found.   ASSESSMENT & PLAN: 52 year old female with past medical history of hypertension, diabetes, obesity, and chronic anemia presents for evaluation of her anemia.  1. Iron deficient anemia secondary to menorrhagia, some component of anemia of chronic disease  -Ms. Manon appears stable  today. Labs indicate worsening anemia, Hgb 9.7 today; serum iron 34 (L). She is symptomatic. Will give 1 dose IV Feraheme today. Return for lab in 3 and 6 months, f/u with Kristina Adams in 6 months.  -She continues to have heavy menses but believes she is perimenopausal, becoming sporadic, LMP 07/2017 and 01/2017 prior to that. Hopefully her iron deficiency anemia will improve with menopause.   2. Fatigue, headache, dizziness  -Likely secondary to her anemia.  -Dizziness resolved with meclizine; although dizziness can occur with anemia, sounds more like vertigo in her case. Will monitor closely.   3. Hypertension, diabetes, obesity, hyperthyroidism -Continue f/u with PCP. I encouraged her to eat healthy and be active   4. Cancer Screenings -I encouraged her to engage in age-related health maintenance and cancer screenings such as mammogram and colonoscopy. She reportedly had colonoscopy in 2016, I don't have the report; has not had a mammogram yet, I recommend she Adams so.   PLAN -Labs reviewed, symptomatic anemia, will give IV Feraheme x1 today -Lab in 3 and 6 months, F/U with Kristina Adams in 6 months -Continue f/u with PCP and routine cancer screenings for age   All questions were answered. The patient knows to call the clinic with any problems, questions or concerns. No barriers to learning was detected.    Alla Feeling, NP 10/07/17

## 2017-10-10 ENCOUNTER — Other Ambulatory Visit: Payer: Federal, State, Local not specified - PPO

## 2017-10-13 ENCOUNTER — Telehealth: Payer: Self-pay | Admitting: Hematology

## 2017-10-13 NOTE — Telephone Encounter (Signed)
Left message for patient with appts date and time per 4/2 sch message.

## 2017-10-17 ENCOUNTER — Other Ambulatory Visit: Payer: Federal, State, Local not specified - PPO

## 2017-10-31 ENCOUNTER — Other Ambulatory Visit: Payer: Federal, State, Local not specified - PPO

## 2017-11-10 ENCOUNTER — Other Ambulatory Visit: Payer: Federal, State, Local not specified - PPO

## 2017-11-17 ENCOUNTER — Ambulatory Visit
Admission: RE | Admit: 2017-11-17 | Discharge: 2017-11-17 | Disposition: A | Discharge status: Home / Self Care | Payer: Federal, State, Local not specified - PPO | Source: Ambulatory Visit | Attending: Family Medicine | Admitting: Family Medicine

## 2017-11-17 DIAGNOSIS — N950 Postmenopausal bleeding: Secondary | ICD-10-CM

## 2018-01-06 ENCOUNTER — Inpatient Hospital Stay: Payer: Federal, State, Local not specified - PPO | Admitting: Hematology

## 2018-01-06 ENCOUNTER — Inpatient Hospital Stay: Payer: Federal, State, Local not specified - PPO | Attending: Hematology

## 2018-04-04 ENCOUNTER — Other Ambulatory Visit: Payer: Self-pay | Admitting: Family Medicine

## 2018-04-04 DIAGNOSIS — E049 Nontoxic goiter, unspecified: Secondary | ICD-10-CM

## 2018-04-07 ENCOUNTER — Inpatient Hospital Stay: Payer: Federal, State, Local not specified - PPO | Attending: Hematology | Admitting: Hematology

## 2018-04-07 ENCOUNTER — Inpatient Hospital Stay: Payer: Federal, State, Local not specified - PPO

## 2018-04-11 ENCOUNTER — Other Ambulatory Visit (HOSPITAL_COMMUNITY): Payer: Self-pay | Admitting: Family Medicine

## 2018-04-11 DIAGNOSIS — E1140 Type 2 diabetes mellitus with diabetic neuropathy, unspecified: Secondary | ICD-10-CM

## 2018-04-11 DIAGNOSIS — I10 Essential (primary) hypertension: Secondary | ICD-10-CM

## 2018-04-11 DIAGNOSIS — K589 Irritable bowel syndrome without diarrhea: Secondary | ICD-10-CM

## 2018-04-11 DIAGNOSIS — F39 Unspecified mood [affective] disorder: Secondary | ICD-10-CM

## 2018-04-11 DIAGNOSIS — E785 Hyperlipidemia, unspecified: Secondary | ICD-10-CM

## 2018-04-11 DIAGNOSIS — K219 Gastro-esophageal reflux disease without esophagitis: Secondary | ICD-10-CM

## 2018-04-21 ENCOUNTER — Ambulatory Visit (HOSPITAL_COMMUNITY): Payer: Federal, State, Local not specified - PPO | Attending: Family Medicine

## 2018-04-21 ENCOUNTER — Encounter (HOSPITAL_COMMUNITY): Payer: Self-pay

## 2019-04-19 IMAGING — US US PELVIS COMPLETE TRANSABD/TRANSVAG
1 series · 13 of 25 positions shown · non-contrast
Comparison: None

CLINICAL DATA: Postmenopausal bleeding.



[Series 1: us pelvis complete transabd/transvag · 0.26mm/px · 13 of 54 slices shown]
[im 1/54]
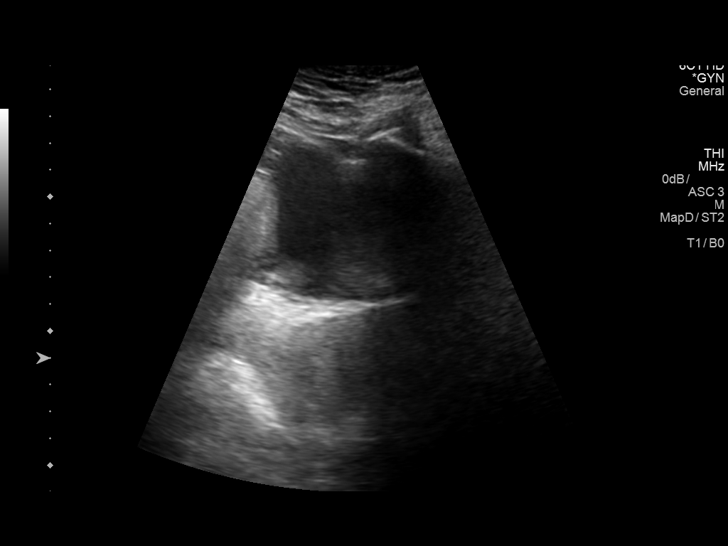
[im 5/54]
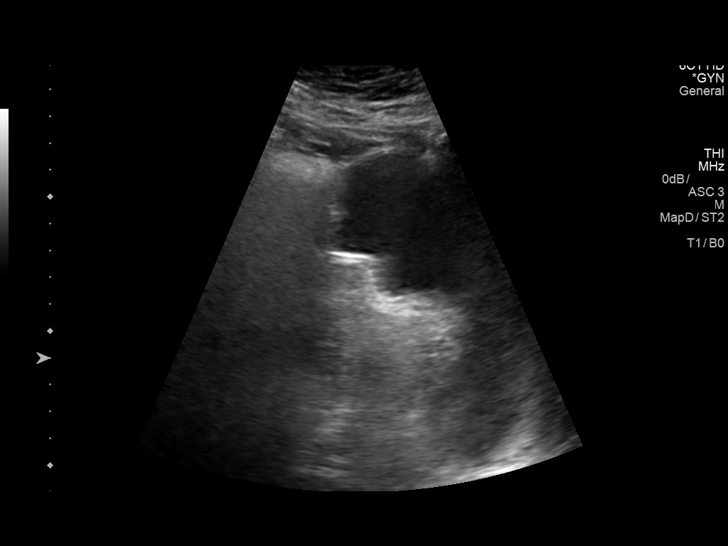
[im 9/54]
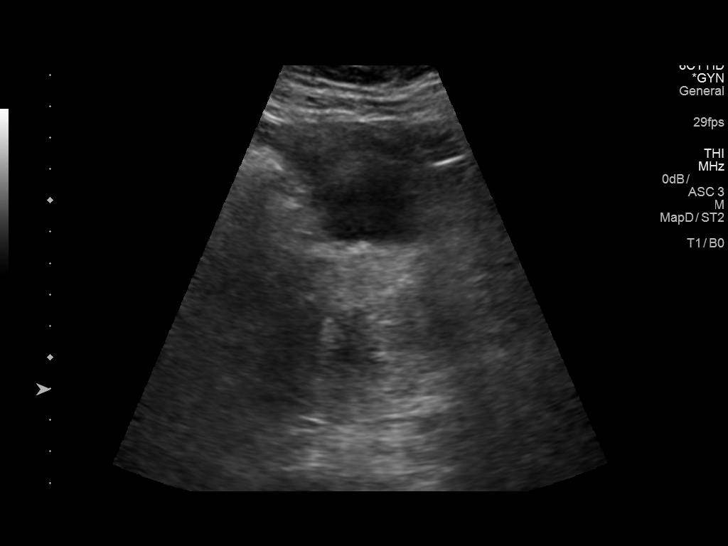
[im 14/54]
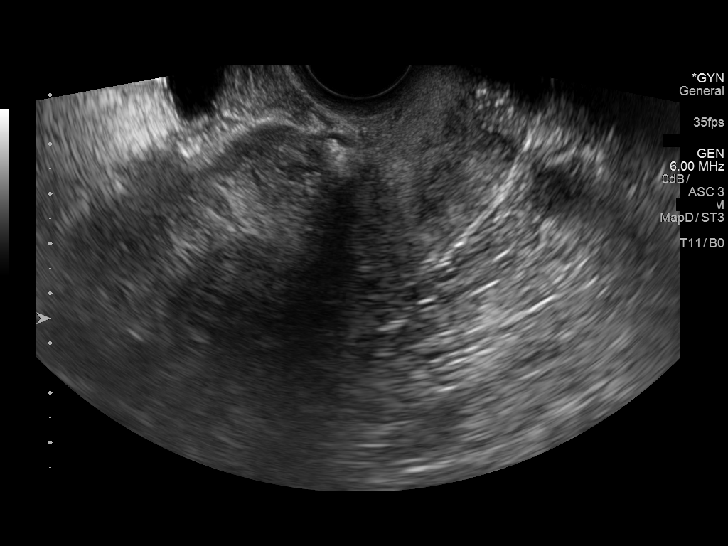
[im 18/54]
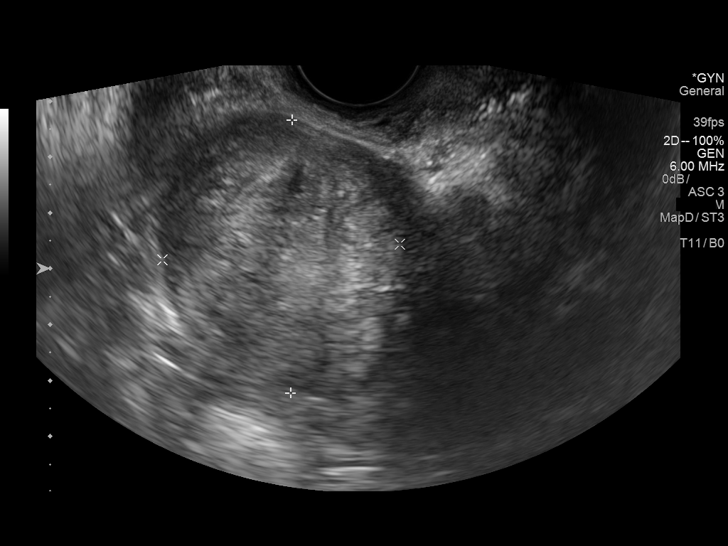
[im 23/54]
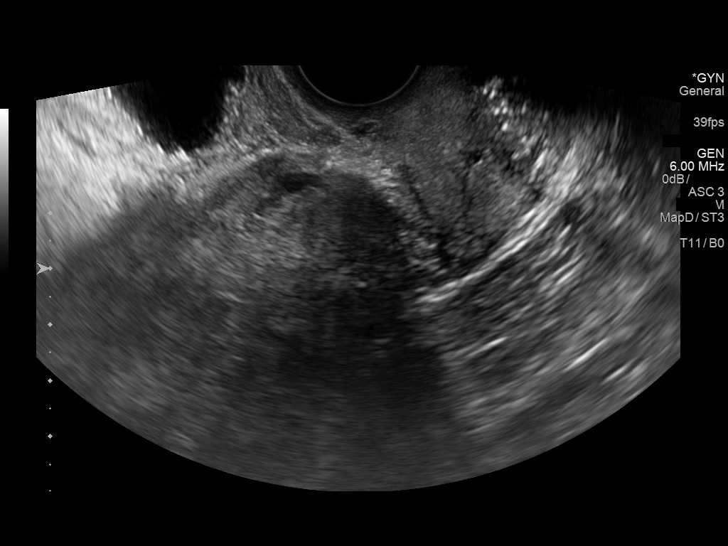
[im 27/54]
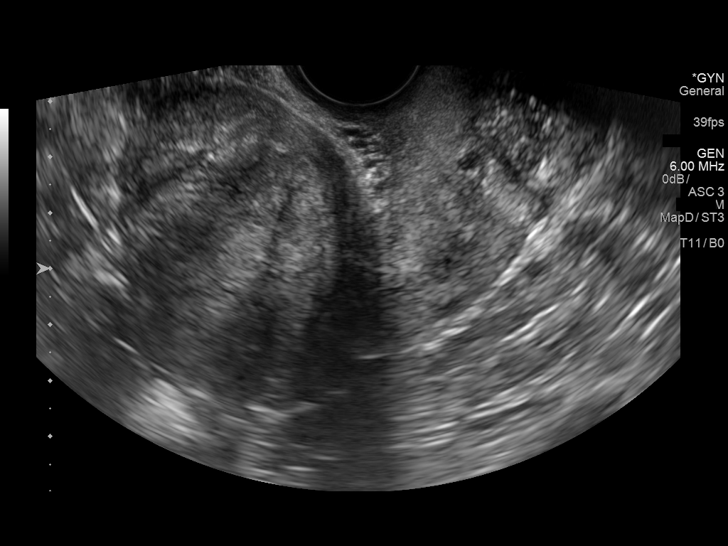
[im 31/54]
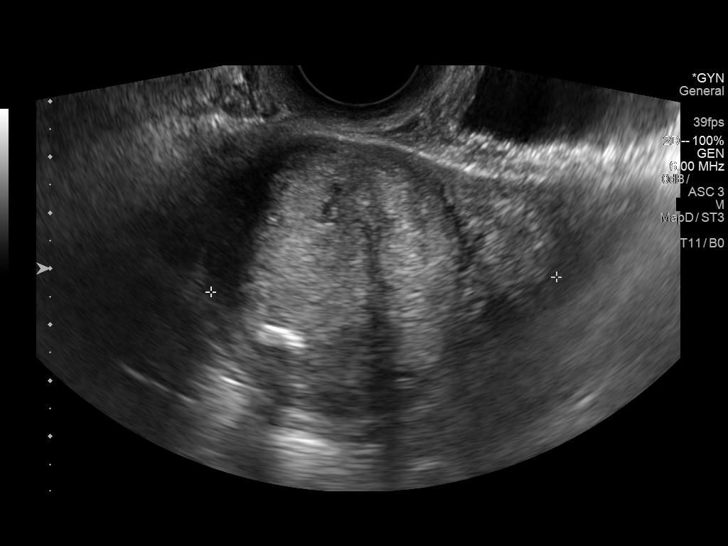
[im 36/54]
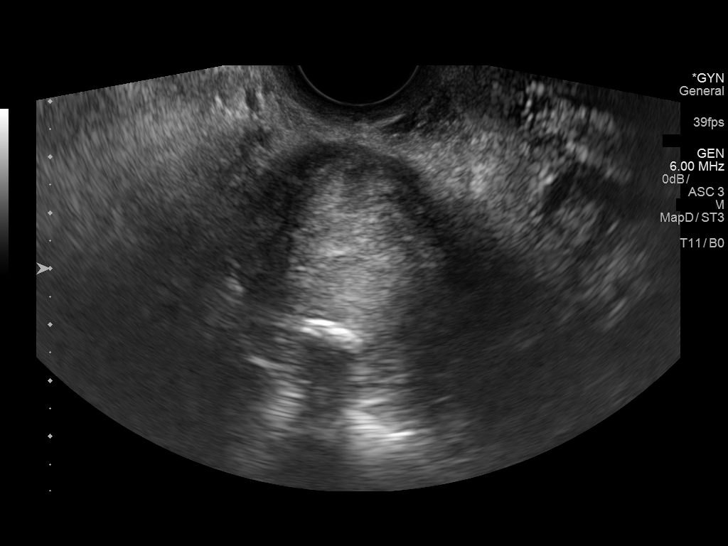
[im 40/54]
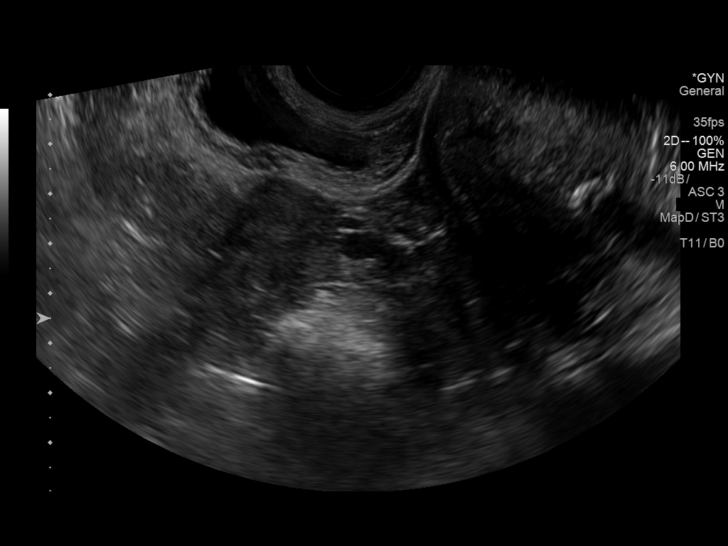
[im 45/54]
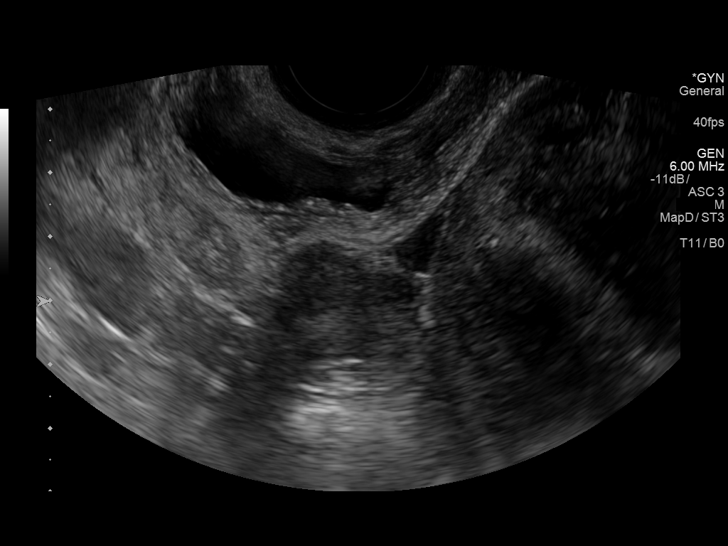
[im 49/54]
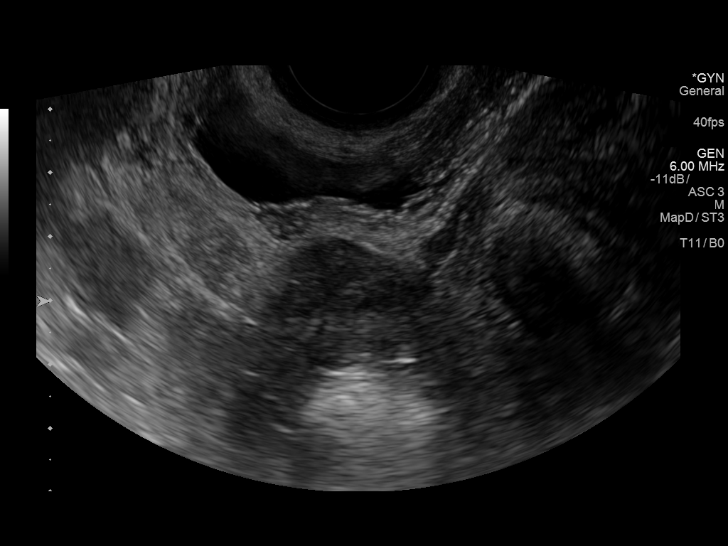
[im 54/54]
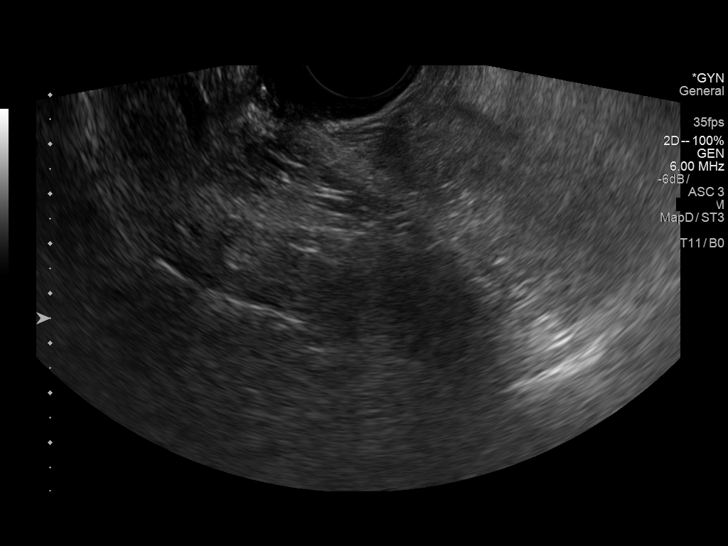

[13 of 25 positions shown; findings below may reference images not displayed]

FINDINGS: Uterus

Measurements: 8.7 x 4.6 x 6.2 cm. There is a partially calcified
myometrial mass measuring 3.8 x 4.9 x 4.3 cm in the uterine body.

Endometrium

Thickness: 1.8 cm. No focal abnormality where visualized. It is
partially obscured by the large myometrial mass.

Right ovary

Measurements: 3.4 x 2.6 x 2.0 cm. Normal appearance/no adnexal mass.

Left ovary

Not seen.

Other findings

No abnormal free fluid.
IMPRESSION: 4.9 cm partially calcified myometrial mass in the uterine body. This
may represent a large partially calcified fibroid. Follow-up in 6
months is recommended, if surgical consultation is not planned.

Incomplete evaluation of the endometrium, which is partially
obscured by the presence of the large myometrial mass.

Normal appearance of the right ovary. Nonvisualization of the left
ovary.

## 2019-04-30 ENCOUNTER — Ambulatory Visit: Payer: Federal, State, Local not specified - PPO | Admitting: Specialist

## 2019-05-01 ENCOUNTER — Other Ambulatory Visit: Payer: Self-pay

## 2019-05-01 ENCOUNTER — Ambulatory Visit: Payer: Self-pay

## 2019-05-01 ENCOUNTER — Ambulatory Visit (INDEPENDENT_AMBULATORY_CARE_PROVIDER_SITE_OTHER): Payer: Federal, State, Local not specified - PPO | Admitting: Specialist

## 2019-05-01 ENCOUNTER — Encounter: Payer: Self-pay | Admitting: Specialist

## 2019-05-01 VITALS — BP 163/72 | HR 63 | Ht 70.0 in | Wt 330.0 lb

## 2019-05-01 DIAGNOSIS — M5440 Lumbago with sciatica, unspecified side: Secondary | ICD-10-CM | POA: Diagnosis not present

## 2019-05-01 DIAGNOSIS — M48062 Spinal stenosis, lumbar region with neurogenic claudication: Secondary | ICD-10-CM

## 2019-05-01 DIAGNOSIS — M4316 Spondylolisthesis, lumbar region: Secondary | ICD-10-CM

## 2019-05-01 DIAGNOSIS — M1711 Unilateral primary osteoarthritis, right knee: Secondary | ICD-10-CM

## 2019-05-01 DIAGNOSIS — M25561 Pain in right knee: Secondary | ICD-10-CM | POA: Diagnosis not present

## 2019-05-01 DIAGNOSIS — M47816 Spondylosis without myelopathy or radiculopathy, lumbar region: Secondary | ICD-10-CM

## 2019-05-01 DIAGNOSIS — M1611 Unilateral primary osteoarthritis, right hip: Secondary | ICD-10-CM

## 2019-05-01 MED ORDER — MELOXICAM 15 MG PO TABS: 15.0000 mg | ORAL_TABLET | Freq: Every day | ORAL | 4 refills | Status: AC

## 2019-05-01 MED ORDER — GABAPENTIN 300 MG PO CAPS: 300.0000 mg | ORAL_CAPSULE | Freq: Every day | ORAL | 3 refills | Status: DC

## 2019-05-01 MED ORDER — TRAMADOL HCL 50 MG PO TABS: 100.0000 mg | ORAL_TABLET | Freq: Four times a day (QID) | ORAL | 0 refills | Status: AC | PRN

## 2019-05-01 NOTE — Patient Instructions (Signed)
Avoid bending, stooping and avoid lifting weights greater than 10 lbs. Avoid prolong standing and walking. Avoid frequent bending and stooping  No lifting greater than 10 lbs. May use ice or moist heat for pain. Weight loss is of benefit. Handicap license is approved. Dr. Romona Curls secretary/Assistant will call to arrange for facet steroid injections.

## 2019-05-01 NOTE — Progress Notes (Signed)
Office Visit Note   Patient: Kristina Adams           Date of Birth: 1965-09-17           MRN: [id number] Visit Date: 05/01/2019              Requested by: [name], MD [address] [place],  [place] [address] PCP: [name], MD   Assessment & Plan: Visit Diagnoses:  1. Acute right-sided low back pain with sciatica, sciatica laterality unspecified   2. Right knee pain, unspecified chronicity     Plan: Avoid bending, stooping and avoid lifting weights greater than 10 lbs. Avoid prolong standing and walking. Avoid frequent bending and stooping  No lifting greater than 10 lbs. May use ice or moist heat for pain. Weight loss is of benefit. Handicap license is approved. Dr. Romona Curls secretary/Assistant will call to arrange for facet steroid injections.   Follow-Up Instructions: No follow-ups on file.   Orders:  Orders Placed This Encounter  Procedures  . XR Lumbar Spine 2-3 Views  . XR Knee 1-2 Views Right   No orders of the defined types were placed in this encounter.     Procedures: No procedures performed   Clinical Data: No additional findings.   Subjective: Chief Complaint  Patient presents with  . Lower Back - Pain  . Right Knee - Pain  . Right Hip - Pain    53 year old female with history   Review of Systems   Objective: Vital Signs: BP (!) 163/72   Pulse 63   Ht 5\' 10"  (1.778 m)   Wt (!) 330 lb (149.7 kg)   BMI 47.35 kg/m   Physical Exam  Ortho Exam  Specialty Comments:  No specialty comments available.  Imaging: No results found.   PMFS History: Patient Active Problem List   Diagnosis Date Noted  . Benign essential hypertension 11/05/2015  . Gastroesophageal reflux disease 11/05/2015  . Uncontrolled type 2 diabetes mellitus with diabetic polyneuropathy, with long-term current use of insulin ([place]) 11/05/2015  . Multinodular goiter 11/06/2013  . Obesity, Class III, BMI 40-49.9 (morbid obesity) ([place])  05/23/2013  . 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 Diabetic neuropathy ([place]) 12/13/2011  . Iron deficiency anemia 12/09/2011  . Sleep apnea 12/03/2011  . Morbid obesity with BMI of 45.0-49.9, adult ([place]) 12/03/2011  . Chronic back pain 12/03/2011  . Hypertension 12/03/2011  . Depression 12/03/2011  . Appendicitis, acute 12/03/2011  . Diabetes mellitus type 2, insulin dependent ([place]) 12/03/2011  . DM (diabetes mellitus) ([place]) 10/06/2011  . HYPOGLYCEMIA 08/22/2010  . ALLERGIC RHINITIS CAUSE UNSPECIFIED 08/22/2010  . HYPERTHYROIDISM 06/27/2010  . DIABETES MELLITUS, TYPE II, UNCONTROLLED 06/27/2010  . DYSLIPIDEMIA 06/27/2010  . MORBID OBESITY 06/27/2010  . UNSPECIFIED ESSENTIAL HYPERTENSION 06/27/2010  . PERS HX NONCOMPLIANCE W/MED TX PRS HAZARDS HLTH 06/27/2010  . NONSPECIFIC ABN FINDING RAD & OTH EXAM GI TRACT 02/26/2009  . NEOPLASM UNCERTAIN BHV OTH&UNSPEC DIGESTIVE ORGN 02/26/2008   Past Medical History:  Diagnosis Date  . Anemia 12/09/2011   child birth  . Appendicitis, acute 12/03/2011  . Chronic back pain 12/03/2011  . Degenerative disc disease, cervical    low back area  . Diabetes mellitus type 2, insulin dependent ([place]) 12/03/2011  . GERD (gastroesophageal reflux disease)   . H/O hiatal hernia   . Hypertension 12/03/2011  . Morbid obesity with BMI of 45.0-49.9, adult ([place]) 12/03/2011  . Sleep apnea 12/03/2011   last sleep  study May 2013  . Thyroid disease     Family History  Problem Relation Age of Onset  . Cancer Mother 50       breast cancer   . Kidney failure Father   . Heart disease Father        No details  . Diabetes Other   . Cancer Other   . Hypertension Other     Past Surgical History:  Procedure Laterality Date  . CESAREAN SECTION    . LAPAROSCOPIC APPENDECTOMY  12/03/2011   Procedure: APPENDECTOMY LAPAROSCOPIC;  Surgeon: [name], DO;  Location: [facility];  Service: General;  Laterality: N/A;  drainage of intra-abdominal abscess   . PARS PLANA  VITRECTOMY Left 08/17/2012   Procedure: PARS PLANA VITRECTOMY WITH 25 GAUGE;  Surgeon: [name], MD;  Location: [facility];  Service: Ophthalmology;  Laterality: Left;  . 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 TUBAL LIGATION    . VITRECTOMY     Social History   Occupational History  . Not on file  Tobacco Use  . Smoking status: Never Smoker  . Smokeless tobacco: Never Used  Substance and Sexual Activity  . Alcohol use: Yes    Alcohol/week: 1.0 standard drinks    Types: 1 Glasses of wine per week  . Drug use: No  . Sexual activity: Yes    Birth control/protection: Pill

## 2019-05-24 ENCOUNTER — Encounter: Payer: Self-pay | Admitting: Physical Medicine and Rehabilitation

## 2019-05-24 ENCOUNTER — Ambulatory Visit: Payer: Self-pay

## 2019-05-24 ENCOUNTER — Other Ambulatory Visit: Payer: Self-pay

## 2019-05-24 ENCOUNTER — Ambulatory Visit: Payer: Federal, State, Local not specified - PPO | Admitting: Physical Medicine and Rehabilitation

## 2019-05-24 VITALS — BP 159/76 | HR 70 | Temp 96.2°F

## 2019-05-24 DIAGNOSIS — M47816 Spondylosis without myelopathy or radiculopathy, lumbar region: Secondary | ICD-10-CM

## 2019-05-24 MED ORDER — METHYLPREDNISOLONE ACETATE 80 MG/ML IJ SUSP
80.0000 mg | Freq: Once | INTRAMUSCULAR | Status: AC
  Administered 2019-05-24: 80 mg

## 2019-05-24 NOTE — Progress Notes (Signed)
 .  Numeric Pain Rating Scale and Functional Assessment Average Pain 5   In the last MONTH (on 0-10 scale) has pain interfered with the following?  1. General activity like being  able to carry out your everyday physical activities such as walking, climbing stairs, carrying groceries, or moving a chair?  Rating(4)   +Driver, -BT, -Dye Allergies.  

## 2019-05-25 ENCOUNTER — Ambulatory Visit: Payer: Federal, State, Local not specified - PPO | Admitting: Specialist

## 2019-06-11 ENCOUNTER — Encounter: Payer: Self-pay | Admitting: Specialist

## 2019-06-11 ENCOUNTER — Ambulatory Visit: Payer: Federal, State, Local not specified - PPO | Admitting: Specialist

## 2019-06-11 ENCOUNTER — Other Ambulatory Visit: Payer: Self-pay

## 2019-06-11 VITALS — BP 130/81 | HR 67 | Ht 70.0 in | Wt 330.0 lb

## 2019-06-11 DIAGNOSIS — M4316 Spondylolisthesis, lumbar region: Secondary | ICD-10-CM | POA: Diagnosis not present

## 2019-06-11 DIAGNOSIS — M1711 Unilateral primary osteoarthritis, right knee: Secondary | ICD-10-CM

## 2019-06-11 DIAGNOSIS — M47816 Spondylosis without myelopathy or radiculopathy, lumbar region: Secondary | ICD-10-CM | POA: Diagnosis not present

## 2019-06-11 MED ORDER — TRAMADOL-ACETAMINOPHEN 37.5-325 MG PO TABS: 1.0000 | ORAL_TABLET | Freq: Four times a day (QID) | ORAL | 0 refills | Status: DC | PRN

## 2019-06-11 MED ORDER — DICLOFENAC SODIUM 1 % EX GEL: 4.0000 g | Freq: Four times a day (QID) | CUTANEOUS | 6 refills | Status: DC

## 2019-06-11 NOTE — Patient Instructions (Signed)
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 Well padded shoes help. Ice the knee 2-3 times a day 15-20 mins at a time.   Patellofemoral Pain Syndrome  Patellofemoral pain syndrome is a condition in which the tissue (cartilage) on the underside of the kneecap (patella) softens or breaks down. This causes pain in the front of the knee. The condition is also called runner's knee or chondromalacia patella. Patellofemoral pain syndrome is most common in young adults who are active in sports. The knee is the largest joint in the body. The patella covers the front of the knee and is attached to muscles above and below the knee. The underside of the patella is covered with a smooth type of cartilage (synovium). The smooth surface helps the patella to glide easily when you move your knee. Patellofemoral pain syndrome causes swelling in the joint linings and bone surfaces in the knee. What are the causes? This condition may be caused by:  Overuse of the knee.  Poor alignment of your knee joints.  Weak leg muscles.  A direct blow to your kneecap. What increases the risk? You are more likely to develop this condition if:  You do a lot of activities that can wear down your kneecap. These include: ? Running. ? Squatting. ? Climbing stairs.  You start a new physical activity or exercise program.  You wear shoes that do not fit well.  You do not have good leg strength.  You are overweight. What are the signs or symptoms? The main symptom of this condition is knee pain. This may feel like a dull, aching pain underneath your patella, in the front of your knee. There may be a popping or cracking sound when you move your knee. Pain may get worse with:  Exercise.  Climbing stairs.  Running.  Jumping.  Squatting.  Kneeling.  Sitting for a long time.  Moving or pushing on your patella. How is  this diagnosed? This condition may be diagnosed based on:  Your symptoms and medical history. You may be asked about your recent physical activities and which ones cause knee pain.  A physical exam. This may include: ? Moving your patella back and forth. ? Checking your range of knee motion. ? Having you squat or jump to see if you have pain. ? Checking the strength of your leg muscles.  Imaging tests to confirm the diagnosis. These may include an MRI of your knee. How is this treated? This condition may be treated at home with rest, ice, compression, and elevation (RICE).  Other treatments may include:  Nonsteroidal anti-inflammatory drugs (NSAIDs).  Physical therapy to stretch and strengthen your leg muscles.  Shoe inserts (orthotics) to take stress off your knee.  A knee brace or knee support.  Adhesive tapes to the skin.  Surgery to remove damaged cartilage or move the patella to a better position. This is rare. Follow these instructions at home: If you have a shoe or brace:  Wear the shoe or brace as told by your health care provider. Remove it only as told by your health care provider.  Loosen the shoe or brace if your toes tingle, become numb, or turn cold and blue.  Keep the shoe or brace clean.  If the shoe or brace is not waterproof: ? Do not let it get wet. ? Cover it with a watertight covering when you take a bath or a shower. Managing pain,  stiffness, and swelling  If directed, put ice on the painful area. ? If you have a removable shoe or brace, remove it as told by your health care provider. ? Put ice in a plastic bag. ? Place a towel between your skin and the bag. ? Leave the ice on for 20 minutes, 2-3 times a day.  Move your toes often to avoid stiffness and to lessen swelling.  Rest your knee: ? Avoid activities that cause knee pain. ? When sitting or lying down, raise (elevate) the injured area above the level of your heart, whenever possible.  General instructions  Take over-the-counter and prescription medicines only as told by your health care provider.  Use splints, braces, knee supports, or walking aids as directed by your health care provider.  Perform stretching and strengthening exercises as told by your health care provider or physical therapist.  Do not use any products that contain nicotine or tobacco, such as cigarettes and e-cigarettes. These can delay healing. If you need help quitting, ask your health care provider.  Return to your normal activities as told by your health care provider. Ask your health care provider what activities are safe for you.  Keep all follow-up visits as told by your health care provider. This is important. Contact a health care provider if:  Your symptoms get worse.  You are not improving with home care. Summary  Patellofemoral pain syndrome is a condition in which the tissue (cartilage) on the underside of the kneecap (patella) softens or breaks down.  This condition causes swelling in the joint linings and bone surfaces in the knee. This leads to pain in the front of the knee.  This condition may be treated at home with rest, ice, compression, and elevation (RICE).  Use splints, braces, knee supports, or walking aids as directed by your health care provider. This information is not intended to replace advice given to you by your health care provider. Make sure you discuss any questions you have with your health care provider. Document Released: 06/16/2009 Document Revised: 08/08/2017 Document Reviewed: 08/08/2017 Elsevier Patient Education  2020 [employer].

## 2019-06-11 NOTE — Progress Notes (Signed)
Office Visit Note   Patient: Kristina Adams           Date of Birth: 01-Jan-1966           MRN: [id number] Visit Date: 06/11/2019              Requested by: [name], MD [address] [place],  [place] [address] PCP: [name], MD   Assessment & Plan: Visit Diagnoses:  1. Unilateral primary osteoarthritis, right knee   2. Spondylosis without myelopathy or radiculopathy, lumbar region   3. Spondylolisthesis, lumbar region     Plan:Knee is suffering from osteoarthritis, proven treatments are Weight loss, NSIADs like diclofenac gel and oral meloxicam and exercise. Well padded shoes help. Ice the knee 2-3 times a day 15-20 mins at a time.   Patellofemoral Pain Syndrome  Patellofemoral pain syndrome is a condition in which the tissue (cartilage) on the underside of the kneecap (patella) softens or breaks down. This causes pain in the front of the knee. The condition is also called runner's knee or chondromalacia patella. Patellofemoral pain syndrome is most common in young adults who are active in sports. The knee is the largest joint in the body. The patella covers the front of the knee and is attached to muscles above and below the knee. The underside of the patella is covered with a smooth type of cartilage (synovium). The smooth surface helps the patella to glide easily when you move your knee. Patellofemoral pain syndrome causes swelling in the joint linings and bone surfaces in the knee. What are the causes? This condition may be caused by:  Overuse of the knee.  Poor alignment of your knee joints.  Weak leg muscles.  A direct blow to your kneecap. What increases the risk? You are more likely to develop this condition if:  You do a lot of activities that can wear down your kneecap. These include: ? Running. ? Squatting. ? Climbing stairs.  You start a new physical activity or exercise program.  You wear shoes that do not fit well.  You do  not have good leg strength.  You are overweight. What are the signs or symptoms? The main symptom of this condition is knee pain. This may feel like a dull, aching pain underneath your patella, in the front of your knee. There may be a popping or cracking sound when you move your knee. Pain may get worse with:  Exercise.  Climbing stairs.  Running.  Jumping.  Squatting.  Kneeling.  Sitting for a long time.  Moving or pushing on your patella. How is this diagnosed? This condition may be diagnosed based on:  Your symptoms and medical history. You may be asked about your recent physical activities and which ones cause knee pain.  A physical exam. This may include: ? Moving your patella back and forth. ? Checking your range of knee motion. ? Having you squat or jump to see if you have pain. ? Checking the strength of your leg muscles.  Imaging tests to confirm the diagnosis. These may include an MRI of your knee. How is this treated? This condition may be treated at home with rest, ice, compression, and elevation (RICE).  Other treatments may include:  Nonsteroidal anti-inflammatory drugs (NSAIDs).  Physical therapy to stretch and strengthen your leg muscles.  Shoe inserts (orthotics) to take stress off your knee.  A knee brace or knee support.  Adhesive tapes to the skin.  Surgery  to remove damaged cartilage or move the patella to a better position. This is rare. Follow these instructions at home: If you have a shoe or brace:  Wear the shoe or brace as told by your health care provider. Remove it only as told by your health care provider.  Loosen the shoe or brace if your toes tingle, become numb, or turn cold and blue.  Keep the shoe or brace clean.  If the shoe or brace is not waterproof: ? Do not let it get wet. ? Cover it with a watertight covering when you take a bath or a shower. Managing pain, stiffness, and swelling  If directed, put ice on the  painful area. ? If you have a removable shoe or brace, remove it as told by your health care provider. ? Put ice in a plastic bag. ? Place a towel between your skin and the bag. ? Leave the ice on for 20 minutes, 2-3 times a day.  Move your toes often to avoid stiffness and to lessen swelling.  Rest your knee: ? Avoid activities that cause knee pain. ? When sitting or lying down, raise (elevate) the injured area above the level of your heart, whenever possible. General instructions  Take over-the-counter and prescription medicines only as told by your health care provider.  Use splints, braces, knee supports, or walking aids as directed by your health care provider.  Perform stretching and strengthening exercises as told by your health care provider or physical therapist.  Do not use any products that contain nicotine or tobacco, such as cigarettes and e-cigarettes. These can delay healing. If you need help quitting, ask your health care provider.  Return to your normal activities as told by your health care provider. Ask your health care provider what activities are safe for you.  Keep all follow-up visits as told by your health care provider. This is important. Contact a health care provider if:  Your symptoms get worse.  You are not improving with home care. Summary  Patellofemoral pain syndrome is a condition in which the tissue (cartilage) on the underside of the kneecap (patella) softens or breaks down.  This condition causes swelling in the joint linings and bone surfaces in the knee. This leads to pain in the front of the knee.  This condition may be treated at home with rest, ice, compression, and elevation (RICE).  Use splints, braces, knee supports, or walking aids as directed by your health care provider. This information is not intended to replace advice given to you by your health care provider. Make sure you discuss any questions you have with your health care  provider. Document Released: 06/16/2009 Document Revised: 08/08/2017 Document Re  Follow-Up Instructions: No follow-ups on file.   Orders:  No orders of the defined types were placed in this encounter.  Meds ordered this encounter  Medications  . diclofenac Sodium (VOLTAREN) 1 % GEL    Sig: Apply 4 g topically 4 (four) times daily.    Dispense:  350 g    Refill:  6  . DISCONTD: traMADol-acetaminophen (ULTRACET) 37.5-325 MG tablet    Sig: Take 1 tablet by mouth every 6 (six) hours as needed for moderate pain.    Dispense:  30 tablet    Refill:  0  . traMADol-acetaminophen (ULTRACET) 37.5-325 MG tablet    Sig: Take 1 tablet by mouth every 6 (six) hours as needed for moderate pain.    Dispense:  30 tablet    Refill:  0      Procedures: No procedures performed   Clinical Data: No additional findings.   Subjective: Chief Complaint  Patient presents with  . Lower Back - Follow-up    Had Bil L4-5 an L5-S1 Facet injection on 05/24/2019 with Dr. Ernestina Patches. States that she did get relief from them.  Noticed last week she has discomfort with standing long periods of time.  . Right Knee - Follow-up    She discomfort at night with the knee    53 year old female with history of lumbar spondylosis and spondylolisthesis, underwent facet injections with some improvement in the back pain about 4 of 10 today, and the right knee is about a 7 of 10. She has right knee pain with first standing and walking. When sitting she has to straighten the knee up some to relieve the pain. No bowel or bladder difficulty. AM stiffness is present and she has pain in the knee with squatting and kneeling and going up and down stairs.     Review of Systems  Constitutional: Negative for activity change, appetite change, chills, diaphoresis, fatigue, fever and unexpected weight change.  HENT: Positive for sinus pressure and sinus pain. Negative for congestion, dental problem, drooling, ear discharge, ear pain,  facial swelling, hearing loss, mouth sores, nosebleeds, postnasal drip, rhinorrhea, sneezing, sore throat, tinnitus, trouble swallowing and voice change.   Eyes: Negative.   Respiratory: Positive for apnea. Negative for cough, choking, chest tightness, shortness of breath, wheezing and stridor.   Cardiovascular: Negative for chest pain, palpitations and leg swelling.  Gastrointestinal: Negative.  Negative for abdominal distention, abdominal pain, anal bleeding, blood in stool, constipation, diarrhea, nausea and rectal pain.  Endocrine: Negative.   Genitourinary: Negative.  Negative for difficulty urinating, dyspareunia, dysuria, enuresis, flank pain and frequency.  Musculoskeletal: Positive for arthralgias, back pain, gait problem and joint swelling.  Skin: Negative.   Allergic/Immunologic: Negative for environmental allergies, food allergies and immunocompromised state.  Neurological: Negative for dizziness, tremors, seizures, syncope, facial asymmetry, speech difficulty, weakness, light-headedness, numbness and headaches.  Hematological: Negative for adenopathy. Does not bruise/bleed easily.  Psychiatric/Behavioral: Negative for agitation, behavioral problems, confusion, decreased concentration, dysphoric mood, hallucinations, self-injury, sleep disturbance and suicidal ideas. The patient is not nervous/anxious and is not hyperactive.      Objective: Vital Signs: BP 130/81 (BP Location: Left Arm, Patient Position: Sitting)   Pulse 67   Ht 5\' 10"  (1.778 m)   Wt (!) 330 lb (149.7 kg)   BMI 47.35 kg/m   Physical Exam Constitutional:      Appearance: She is well-developed.  HENT:     Head: Normocephalic and atraumatic.  Eyes:     Pupils: Pupils are equal, round, and reactive to light.  Neck:     Musculoskeletal: Normal range of motion and neck supple.  Pulmonary:     Effort: Pulmonary effort is normal.     Breath sounds: Normal breath sounds.  Abdominal:     General: Bowel sounds  are normal.     Palpations: Abdomen is soft.  Skin:    General: Skin is warm and dry.  Neurological:     Mental Status: She is alert and oriented to person, place, and time.  Psychiatric:        Behavior: Behavior normal.        Thought Content: Thought content normal.        Judgment: Judgment normal.     Back Exam   Tenderness  The patient is experiencing  tenderness in the lumbar.  Range of Motion  Extension: abnormal  Flexion: abnormal  Lateral bend right: normal  Lateral bend left: normal  Rotation right: normal  Rotation left: normal   Muscle Strength  Right Quadriceps:  5/5  Left Quadriceps:  5/5  Right Hamstrings:  5/5  Left Hamstrings:  5/5   Tests  Straight leg raise right: negative Straight leg raise left: negative  Reflexes  Patellar: 2/4 Achilles: 2/4 Biceps: 2/4 Babinski's sign: normal   Other  Toe walk: normal Heel walk: normal Gait: normal  Erythema: no back redness Scars: absent      Specialty Comments:  No specialty comments available.  Imaging: No results found.   PMFS History: Patient Active Problem List   Diagnosis Date Noted  . Benign essential hypertension 11/05/2015  . Gastroesophageal reflux disease 11/05/2015  . Uncontrolled type 2 diabetes mellitus with diabetic polyneuropathy, with long-term current use of insulin ([place]) 11/05/2015  . Multinodular goiter 11/06/2013  . Obesity, Class III, BMI 40-49.9 (morbid obesity) ([place]) 05/23/2013  . 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 Diabetic neuropathy ([place]) 12/13/2011  . Iron deficiency anemia 12/09/2011  . Sleep apnea 12/03/2011  . Morbid obesity with BMI of 45.0-49.9, adult ([place]) 12/03/2011  . Chronic back pain 12/03/2011  . Hypertension 12/03/2011  . Depression 12/03/2011  . Appendicitis, acute 12/03/2011  . Diabetes mellitus type 2, insulin dependent ([place]) 12/03/2011  . DM (diabetes mellitus) ([place]) 10/06/2011  . HYPOGLYCEMIA 08/22/2010  . ALLERGIC RHINITIS CAUSE  UNSPECIFIED 08/22/2010  . HYPERTHYROIDISM 06/27/2010  . DIABETES MELLITUS, TYPE II, UNCONTROLLED 06/27/2010  . DYSLIPIDEMIA 06/27/2010  . MORBID OBESITY 06/27/2010  . UNSPECIFIED ESSENTIAL HYPERTENSION 06/27/2010  . PERS HX NONCOMPLIANCE W/MED TX PRS HAZARDS HLTH 06/27/2010  . NONSPECIFIC ABN FINDING RAD & OTH EXAM GI TRACT 02/26/2009  . NEOPLASM UNCERTAIN BHV OTH&UNSPEC DIGESTIVE ORGN 02/26/2008   Past Medical History:  Diagnosis Date  . Anemia 12/09/2011   child birth  . Appendicitis, acute 12/03/2011  . Chronic back pain 12/03/2011  . Degenerative disc disease, cervical    low back area  . Diabetes mellitus type 2, insulin dependent ([place]) 12/03/2011  . GERD (gastroesophageal reflux disease)   . H/O hiatal hernia   . Hypertension 12/03/2011  . Morbid obesity with BMI of 45.0-49.9, adult ([place]) 12/03/2011  . Sleep apnea 12/03/2011   last sleep study May 2013  . Thyroid disease     Family History  Problem Relation Age of Onset  . Cancer Mother 58       breast cancer   . Kidney failure Father   . Heart disease Father        No details  . Diabetes Other   . Cancer Other   . Hypertension Other     Past Surgical History:  Procedure Laterality Date  . CESAREAN SECTION    . LAPAROSCOPIC APPENDECTOMY  12/03/2011   Procedure: APPENDECTOMY LAPAROSCOPIC;  Surgeon: [name], DO;  Location: [facility];  Service: General;  Laterality: N/A;  drainage of intra-abdominal abscess   . PARS PLANA VITRECTOMY Left 08/17/2012   Procedure: PARS PLANA VITRECTOMY WITH 25 GAUGE;  Surgeon: [name], MD;  Location: [facility];  Service: Ophthalmology;  Laterality: Left;  . 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   Procedure: REPAIR OF COMPLEX TRACTION RETINAL DETACHMENT;  Surgeon: [name], MD;  Location: [facility];  Service: Ophthalmology;  Laterality: Left;  . TUBAL LIGATION    . VITRECTOMY  Social History   Occupational History  . Not on file  Tobacco Use  . Smoking  status: Never Smoker  . Smokeless tobacco: Never Used  Substance and Sexual Activity  . Alcohol use: Yes    Alcohol/week: 1.0 standard drinks    Types: 1 Glasses of wine per week  . Drug use: No  . Sexual activity: Yes    Birth control/protection: Pill

## 2019-08-13 NOTE — Progress Notes (Signed)
Kristina Adams - 54 y.o. female MRN [id number]  Date of birth: 12/21/1965  Office Visit Note: Visit Date: 05/24/2019 PCP: [name], MD Referred by: [name], MD  Subjective: Chief Complaint  Patient presents with  . Lower Back - Pain   HPI: Kristina Adams is a 54 y.o. female who comes in today At the request of Basil Dess, MD for bilateral L4-5 and L5-S1 facet/medial branch blocks.  She is having axial low back pain.  The patient has failed conservative care including home exercise, medications, time and activity modification.  This injection will be diagnostic and hopefully therapeutic.  Please see requesting physician notes for further details and justification.   ROS Otherwise per HPI.  Assessment & Plan: Visit Diagnoses:  1. Spondylosis without myelopathy or radiculopathy, lumbar region     Plan: No additional findings.   Meds & Orders:  Meds ordered this encounter  Medications  . methylPREDNISolone acetate (DEPO-MEDROL) injection 80 mg    Orders Placed This Encounter  Procedures  . Facet Injection  . XR C-ARM NO REPORT    Follow-up: Return for Review Pain Diary.   Procedures: No procedures performed  Lumbar Diagnostic Facet Joint Nerve Block with Fluoroscopic Guidance   Patient: Kristina Adams      Date of Birth: 1966/05/14 MRN: [id number] PCP: [name], MD      Visit Date: 05/24/2019   Universal Protocol:    Date/Time: 02/01/216:10 AM  Consent Given By: the patient  Position: PRONE  Additional Comments: Vital signs were monitored before and after the procedure. Patient was prepped and draped in the usual sterile fashion. The correct patient, procedure, and site was verified.   Injection Procedure Details:  Procedure Site One Meds Administered:  Meds ordered this encounter  Medications  . methylPREDNISolone acetate (DEPO-MEDROL) injection 80 mg     Laterality: Bilateral  Location/Site:  L4-L5 L5-S1  Needle size: 22  ga.  Needle type:spinal  Needle Placement: Oblique pedical  Findings:   -Comments: There was excellent flow of contrast along the articular pillars without intravascular flow.  Procedure Details: The fluoroscope beam is vertically oriented in AP and then obliqued 15 to 20 degrees to the ipsilateral side of the desired nerve to achieve the "Scotty dog" appearance.  The skin over the target area of the junction of the superior articulating process and the transverse process (sacral ala if blocking the L5 dorsal rami) was locally anesthetized with a 1 ml volume of 1% Lidocaine without Epinephrine.  The spinal needle was inserted and advanced in a trajectory view down to the target.   After contact with periosteum and negative aspirate for blood and CSF, correct placement without intravascular or epidural spread was confirmed by injecting 0.5 ml. of Isovue-250.  A spot radiograph was obtained of this image.    Next, a 0.5 ml. volume of the injectate described above was injected. The needle was then redirected to the other facet joint nerves mentioned above if needed.  Prior to the procedure, the patient was given a Pain Diary which was completed for baseline measurements.  After the procedure, the patient rated their pain every 30 minutes and will continue rating at this frequency for a total of 5 hours.  The patient has been asked to complete the Diary and return to [place] by mail, fax or hand delivered as soon as possible.   Additional Comments:  The patient tolerated the procedure well Dressing: 2 x 2 sterile gauze  and Band-Aid    Post-procedure details: Patient was observed during the procedure. Post-procedure instructions were reviewed.  Patient left the clinic in stable condition.    Clinical History: No specialty comments available.   She reports that she has never smoked. She has never used smokeless tobacco. No results for input(s): HGBA1C, LABURIC in the last 8760  hours.  Objective:  VS:  HT:    WT:   BMI:     BP:(!) 159/76  HR:70bpm  TEMP:(!) 96.2 F (35.7 C)( )  RESP:  Physical Exam  Ortho Exam Imaging: No results found.  Past Medical/Family/Surgical/Social History: Medications & Allergies reviewed per EMR, new medications updated. Patient Active Problem List   Diagnosis Date Noted  . Benign essential hypertension 11/05/2015  . Gastroesophageal reflux disease 11/05/2015  . Uncontrolled type 2 diabetes mellitus with diabetic polyneuropathy, with long-term current use of insulin ([place]) 11/05/2015  . Multinodular goiter 11/06/2013  . Obesity, Class III, BMI 40-49.9 (morbid obesity) ([place]) 05/23/2013  . 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 Diabetic neuropathy ([place]) 12/13/2011  . Iron deficiency anemia 12/09/2011  . Sleep apnea 12/03/2011  . Morbid obesity with BMI of 45.0-49.9, adult ([place]) 12/03/2011  . Chronic back pain 12/03/2011  . Hypertension 12/03/2011  . Depression 12/03/2011  . Appendicitis, acute 12/03/2011  . Diabetes mellitus type 2, insulin dependent ([place]) 12/03/2011  . DM (diabetes mellitus) ([place]) 10/06/2011  . HYPOGLYCEMIA 08/22/2010  . ALLERGIC RHINITIS CAUSE UNSPECIFIED 08/22/2010  . HYPERTHYROIDISM 06/27/2010  . DIABETES MELLITUS, TYPE II, UNCONTROLLED 06/27/2010  . DYSLIPIDEMIA 06/27/2010  . MORBID OBESITY 06/27/2010  . UNSPECIFIED ESSENTIAL HYPERTENSION 06/27/2010  . PERS HX NONCOMPLIANCE W/MED TX PRS HAZARDS HLTH 06/27/2010  . NONSPECIFIC ABN FINDING RAD & OTH EXAM GI TRACT 02/26/2009  . NEOPLASM UNCERTAIN BHV OTH&UNSPEC DIGESTIVE ORGN 02/26/2008   Past Medical History:  Diagnosis Date  . Anemia 12/09/2011   child birth  . Appendicitis, acute 12/03/2011  . Chronic back pain 12/03/2011  . Degenerative disc disease, cervical    low back area  . Diabetes mellitus type 2, insulin dependent ([place]) 12/03/2011  . GERD (gastroesophageal reflux disease)   . H/O hiatal hernia   . Hypertension 12/03/2011  . Morbid  obesity with BMI of 45.0-49.9, adult ([place]) 12/03/2011  . Sleep apnea 12/03/2011   last sleep study May 2013  . Thyroid disease    Family History  Problem Relation Age of Onset  . Cancer Mother 19       breast cancer   . Kidney failure Father   . Heart disease Father        No details  . Diabetes Other   . Cancer Other   . Hypertension Other    Past Surgical History:  Procedure Laterality Date  . CESAREAN SECTION    . LAPAROSCOPIC APPENDECTOMY  12/03/2011   Procedure: APPENDECTOMY LAPAROSCOPIC;  Surgeon: [name], DO;  [facility];  Service: General;  Laterality: N/A;  drainage of intra-abdominal abscess   . PARS PLANA VITRECTOMY Left 08/17/2012   Procedure: PARS PLANA VITRECTOMY WITH 25 GAUGE;  Surgeon: [name], MD;  Location: [facility];  Service: Ophthalmology;  Laterality: Left;  . 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   Procedure: REPAIR OF COMPLEX TRACTION RETINAL DETACHMENT;  Surgeon: [name], MD;  Location: [facility];  Service: Ophthalmology;  Laterality: Left;  . TUBAL LIGATION    . VITRECTOMY     Social History   Occupational History  . Not on  file  Tobacco Use  . Smoking status: Never Smoker  . Smokeless tobacco: Never Used  Substance and Sexual Activity  . Alcohol use: Yes    Alcohol/week: 1.0 standard drinks    Types: 1 Glasses of wine per week  . Drug use: No  . Sexual activity: Yes    Birth control/protection: Pill

## 2019-08-13 NOTE — Procedures (Signed)
Lumbar Diagnostic Facet Joint Nerve Block with Fluoroscopic Guidance   Patient: Kristina Adams      Date of Birth: [birth year] MRN: [id number] PCP: [name], MD      Visit Date: 05/24/2019   Universal Protocol:    Date/Time: 02/01/216:10 AM  Consent Given By: the patient  Position: PRONE  Additional Comments: Vital signs were monitored before and after the procedure. Patient was prepped and draped in the usual sterile fashion. The correct patient, procedure, and site was verified.   Injection Procedure Details:  Procedure Site One Meds Administered:  Meds ordered this encounter  Medications  . methylPREDNISolone acetate (DEPO-MEDROL) injection 80 mg     Laterality: Bilateral  Location/Site:  L4-L5 L5-S1  Needle size: 22 ga.  Needle type:spinal  Needle Placement: Oblique pedical  Findings:   -Comments: There was excellent flow of contrast along the articular pillars without intravascular flow.  Procedure Details: The fluoroscope beam is vertically oriented in AP and then obliqued 15 to 20 degrees to the ipsilateral side of the desired nerve to achieve the "Scotty dog" appearance.  The skin over the target area of the junction of the superior articulating process and the transverse process (sacral ala if blocking the L5 dorsal rami) was locally anesthetized with a 1 ml volume of 1% Lidocaine without Epinephrine.  The spinal needle was inserted and advanced in a trajectory view down to the target.   After contact with periosteum and negative aspirate for blood and CSF, correct placement without intravascular or epidural spread was confirmed by injecting 0.5 ml. of Isovue-250.  A spot radiograph was obtained of this image.    Next, a 0.5 ml. volume of the injectate described above was injected. The needle was then redirected to the other facet joint nerves mentioned above if needed.  Prior to the procedure, the patient was given a Pain Diary which was completed for  baseline measurements.  After the procedure, the patient rated their pain every 30 minutes and will continue rating at this frequency for a total of 5 hours.  The patient has been asked to complete the Diary and return to [place] by mail, fax or hand delivered as soon as possible.   Additional Comments:  The patient tolerated the procedure well Dressing: 2 x 2 sterile gauze and Band-Aid    Post-procedure details: Patient was observed during the procedure. Post-procedure instructions were reviewed.  Patient left the clinic in stable condition.

## 2019-09-10 ENCOUNTER — Ambulatory Visit: Payer: Self-pay

## 2019-09-10 ENCOUNTER — Ambulatory Visit: Payer: Federal, State, Local not specified - PPO | Admitting: Specialist

## 2019-09-10 ENCOUNTER — Other Ambulatory Visit: Payer: Self-pay

## 2019-09-10 ENCOUNTER — Encounter: Payer: Self-pay | Admitting: Specialist

## 2019-09-10 VITALS — BP 142/61 | HR 62 | Ht 70.0 in | Wt 345.2 lb

## 2019-09-10 DIAGNOSIS — M25561 Pain in right knee: Secondary | ICD-10-CM

## 2019-09-10 DIAGNOSIS — M1611 Unilateral primary osteoarthritis, right hip: Secondary | ICD-10-CM | POA: Diagnosis not present

## 2019-09-10 DIAGNOSIS — M48062 Spinal stenosis, lumbar region with neurogenic claudication: Secondary | ICD-10-CM | POA: Diagnosis not present

## 2019-09-10 DIAGNOSIS — G5602 Carpal tunnel syndrome, left upper limb: Secondary | ICD-10-CM

## 2019-09-10 DIAGNOSIS — M542 Cervicalgia: Secondary | ICD-10-CM

## 2019-09-10 DIAGNOSIS — M4316 Spondylolisthesis, lumbar region: Secondary | ICD-10-CM

## 2019-09-10 MED ORDER — METHYLPREDNISOLONE 4 MG PO TABS: ORAL_TABLET | ORAL | 0 refills | Status: AC

## 2019-09-10 NOTE — Progress Notes (Signed)
Patient: Kristina Adams           Date of Birth: 08-21-1965           MRN: [id number] Visit Date: 09/10/2019              Requested by: [name], MD [place] [address] [place],  [place] [address] PCP: [name], MD   Assessment & Plan: Visit Diagnoses:  1. Cervicalgia   2. Spinal stenosis of lumbar region with neurogenic claudication   3. Right knee pain, unspecified chronicity   4. Unilateral primary osteoarthritis, right hip   5. Spondylolisthesis, lumbar region   6. Carpal tunnel syndrome, left upper limb     Plan:   Office Visit NoteCarpal Tunnel Syndrome  Carpal tunnel syndrome is a condition that causes pain in your hand and arm. The carpal tunnel is a narrow area located on the palm side of your wrist. Repeated wrist motion or certain diseases may cause swelling within the tunnel. This swelling pinches the main nerve in the wrist (median nerve). What are the causes? This condition may be caused by:  Repeated wrist motions.  Wrist injuries.  Arthritis.  A cyst or tumor in the carpal tunnel.  Fluid buildup during pregnancy. Sometimes the cause of this condition is not known. What increases the risk? This condition is more likely to develop in:  People who have jobs that cause them to repeatedly move their wrists in the same motion, such as Art gallery manager.  Women.  People with certain conditions, such as: ? Diabetes. ? Obesity. ? An underactive thyroid (hypothyroidism). ? Kidney failure. What are the signs or symptoms? Symptoms of this condition include:  A tingling feeling in your fingers, especially in your thumb, index, and middle fingers.  Tingling or numbness in your hand.  An aching feeling in your entire arm, especially when your wrist and elbow are bent for long periods of time.  Wrist pain that goes up your arm to your shoulder.  Pain that goes down into your palm or fingers.  A weak feeling in your hands. You  may have trouble grabbing and holding items. Your symptoms may feel worse during the night. How is this diagnosed? This condition is diagnosed with a medical history and physical exam. You may also have tests, including:  An electromyogram (EMG). This test measures electrical signals sent by your nerves into the muscles.  X-rays. How is this treated? Treatment for this condition includes:  Lifestyle changes. It is important to stop doing or modify the activity that caused your condition.  Physical or occupational therapy.  Medicines for pain and inflammation. This may include medicine that is injected into your wrist.  A wrist splint.  Surgery. Follow these instructions at home: If you have a splint:   Wear it as told by your health care provider. Remove it only as told by your health care provider.  Loosen the splint if your fingers become numb and tingle, or if they turn cold and blue.  Keep the splint clean and dry. General instructions   Take over-the-counter and prescription medicines only as told by your health care provider.  Rest your wrist from any activity that may be causing your pain. If your condition is work related, talk to your employer about changes that can be made, such as getting a wrist pad to use while typing.  If directed, apply ice to the painful area: ? Put ice in a  plastic bag. ? Place a towel between your skin and the bag. ? Leave the ice on for 20 minutes, 2-3 times per day.  Keep all follow-up visits as told by your health care provider. This is important.  Do any exercises as told by your health care provider, physical therapist, or occupational therapist. Contact a health care provider if:  You have new symptoms.  Your pain is not controlled with medicines.  Your symptoms get worse. This information is not intended to replace advice given to you by your health care provider. Make sure you discuss any questions you have with your health  care provider. Document Released: 06/25/2000 Document Revised: 11/06/2015 Document Reviewed: 03/09/2017 Elsevier Interactive Patient Education  2017 [place] Instructions: Return in about 3 weeks (around 10/01/2019).   Orders:  Orders Placed This Encounter  Procedures  . XR Cervical Spine 2 or 3 views   Meds ordered this encounter  Medications  . methylPREDNISolone (MEDROL) 4 MG tablet    Sig: Take 1 tablet (4 mg total) by mouth daily for 14 days, THEN 0.5 tablets (2 mg total) daily for 14 days.    Dispense:  21 tablet    Refill:  0      Procedures: No procedures performed   Clinical Data: No additional findings.   Subjective: Chief Complaint  Patient presents with  . Lower Back - Follow-up  . Right Knee - Follow-up    54 year old female with history of knee and back pain, more recently with pain in the left neck and numbness and paresthesias in the left radial 3 fingers. Pain and numbness mainl at night.    Review of Systems  Constitutional: Negative.   HENT: Negative.   Eyes: Negative.   Respiratory: Negative.   Cardiovascular: Negative.   Gastrointestinal: Negative.   Endocrine: Negative.   Genitourinary: Negative.   Musculoskeletal: Negative.   Skin: Negative.   Allergic/Immunologic: Negative.   Neurological: Negative.   Hematological: Negative.   Psychiatric/Behavioral: Negative.      Objective: Vital Signs: BP (!) 142/61 (BP Location: Left Arm, Patient Position: Sitting)   Pulse 62   Wt (!) 345 lb 3.2 oz (156.6 kg)   BMI 49.53 kg/m   Physical Exam Constitutional:      Appearance: She is well-developed.  HENT:     Head: Normocephalic and atraumatic.  Eyes:     Pupils: Pupils are equal, round, and reactive to light.  Pulmonary:     Effort: Pulmonary effort is normal.     Breath sounds: Normal breath sounds.  Abdominal:     General: Bowel sounds are normal.     Palpations: Abdomen is soft.  Musculoskeletal:         General: Normal range of motion.     Cervical back: Normal range of motion and neck supple.  Skin:    General: Skin is warm and dry.  Neurological:     Mental Status: She is alert and oriented to person, place, and time.  Psychiatric:        Behavior: Behavior normal.        Thought Content: Thought content normal.        Judgment: Judgment normal.     Left Hand Exam   Tenderness  The patient is experiencing tenderness in the palmar area and radial area.   Range of Motion  Wrist  Extension: normal  Flexion: normal  Pronation: normal  Supination: normal   Tests  Phalen's sign: positive  Tinel's sign (median nerve): positive  Other  Erythema: absent Scars: absent Sensation: decreased Pulse: present      Specialty Comments:  No specialty comments available.  Imaging: No results found.   PMFS History: Patient Active Problem List   Diagnosis Date Noted  . Benign essential hypertension 11/05/2015  . Gastroesophageal reflux disease 11/05/2015  . Uncontrolled type 2 diabetes mellitus with diabetic polyneuropathy, with long-term current use of insulin ([place]) 11/05/2015  . Multinodular goiter 11/06/2013  . Obesity, Class III, BMI 40-49.9 (morbid obesity) ([place]) 05/23/2013  . 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 Diabetic neuropathy ([place]) 12/13/2011  . Iron deficiency anemia 12/09/2011  . Sleep apnea 12/03/2011  . Morbid obesity with BMI of 45.0-49.9, adult ([place]) 12/03/2011  . Chronic back pain 12/03/2011  . Hypertension 12/03/2011  . Depression 12/03/2011  . Appendicitis, acute 12/03/2011  . Diabetes mellitus type 2, insulin dependent ([place]) 12/03/2011  . DM (diabetes mellitus) ([place]) 10/06/2011  . HYPOGLYCEMIA 08/22/2010  . ALLERGIC RHINITIS CAUSE UNSPECIFIED 08/22/2010  . HYPERTHYROIDISM 06/27/2010  . DIABETES MELLITUS, TYPE II, UNCONTROLLED 06/27/2010  . DYSLIPIDEMIA 06/27/2010  . MORBID OBESITY 06/27/2010  . UNSPECIFIED ESSENTIAL HYPERTENSION 06/27/2010   . PERS HX NONCOMPLIANCE W/MED TX PRS HAZARDS HLTH 06/27/2010  . NONSPECIFIC ABN FINDING RAD & OTH EXAM GI TRACT 02/26/2009  . NEOPLASM UNCERTAIN BHV OTH&UNSPEC DIGESTIVE ORGN 02/26/2008   Past Medical History:  Diagnosis Date  . Anemia 12/09/2011   child birth  . Appendicitis, acute 12/03/2011  . Chronic back pain 12/03/2011  . Degenerative disc disease, cervical    low back area  . Diabetes mellitus type 2, insulin dependent ([place]) 12/03/2011  . GERD (gastroesophageal reflux disease)   . H/O hiatal hernia   . Hypertension 12/03/2011  . Morbid obesity with BMI of 45.0-49.9, adult ([place]) 12/03/2011  . Sleep apnea 12/03/2011   last sleep study May 2013  . Thyroid disease     Family History  Problem Relation Age of Onset  . Cancer Mother 61       breast cancer   . Kidney failure Father   . Heart disease Father        No details  . Diabetes Other   . Cancer Other   . Hypertension Other     Past Surgical History:  Procedure Laterality Date  . CESAREAN SECTION    . LAPAROSCOPIC APPENDECTOMY  12/03/2011   Procedure: APPENDECTOMY LAPAROSCOPIC;  Surgeon: [name], DO;  Location: [facility];  Service: General;  Laterality: N/A;  drainage of intra-abdominal abscess   . PARS PLANA VITRECTOMY Left 08/17/2012   Procedure: PARS PLANA VITRECTOMY WITH 25 GAUGE;  Surgeon: [name], MD;  Location: [facility];  Service: Ophthalmology;  Laterality: Left;  . 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   Procedure: REPAIR OF COMPLEX TRACTION RETINAL DETACHMENT;  Surgeon: [name], MD;  Location: [facility];  Service: Ophthalmology;  Laterality: Left;  . TUBAL LIGATION    . VITRECTOMY     Social History   Occupational History  . Not on file  Tobacco Use  . Smoking status: Never Smoker  . Smokeless tobacco: Never Used  Substance and Sexual Activity  . Alcohol use: Yes    Alcohol/week: 1.0 standard drinks    Types: 1 Glasses of wine per week  . Drug use: No  . Sexual activity:  Yes    Birth control/protection: Pill

## 2019-09-10 NOTE — Patient Instructions (Signed)
Carpal Tunnel Syndrome  Carpal tunnel syndrome is a condition that causes pain in your hand and arm. The carpal tunnel is a narrow area located on the palm side of your wrist. Repeated wrist motion or certain diseases may cause swelling within the tunnel. This swelling pinches the main nerve in the wrist (median nerve). What are the causes? This condition may be caused by:  Repeated wrist motions.  Wrist injuries.  Arthritis.  A cyst or tumor in the carpal tunnel.  Fluid buildup during pregnancy. Sometimes the cause of this condition is not known. What increases the risk? This condition is more likely to develop in:  People who have jobs that cause them to repeatedly move their wrists in the same motion, such as butchers and cashiers.  Women.  People with certain conditions, such as: ? Diabetes. ? Obesity. ? An underactive thyroid (hypothyroidism). ? Kidney failure. What are the signs or symptoms? Symptoms of this condition include:  A tingling feeling in your fingers, especially in your thumb, index, and middle fingers.  Tingling or numbness in your hand.  An aching feeling in your entire arm, especially when your wrist and elbow are bent for long periods of time.  Wrist pain that goes up your arm to your shoulder.  Pain that goes down into your palm or fingers.  A weak feeling in your hands. You may have trouble grabbing and holding items. Your symptoms may feel worse during the night. How is this diagnosed? This condition is diagnosed with a medical history and physical exam. You may also have tests, including:  An electromyogram (EMG). This test measures electrical signals sent by your nerves into the muscles.  X-rays. How is this treated? Treatment for this condition includes:  Lifestyle changes. It is important to stop doing or modify the activity that caused your condition.  Physical or occupational therapy.  Medicines for pain and inflammation. This may  include medicine that is injected into your wrist.  A wrist splint.  Surgery. Follow these instructions at home: If you have a splint:   Wear it as told by your health care provider. Remove it only as told by your health care provider.  Loosen the splint if your fingers become numb and tingle, or if they turn cold and blue.  Keep the splint clean and dry. General instructions   Take over-the-counter and prescription medicines only as told by your health care provider.  Rest your wrist from any activity that may be causing your pain. If your condition is work related, talk to your employer about changes that can be made, such as getting a wrist pad to use while typing.  If directed, apply ice to the painful area: ? Put ice in a plastic bag. ? Place a towel between your skin and the bag. ? Leave the ice on for 20 minutes, 2-3 times per day.  Keep all follow-up visits as told by your health care provider. This is important.  Do any exercises as told by your health care provider, physical therapist, or occupational therapist. Contact a health care provider if:  You have new symptoms.  Your pain is not controlled with medicines.  Your symptoms get worse. This information is not intended to replace advice given to you by your health care provider. Make sure you discuss any questions you have with your health care provider. Document Released: 06/25/2000 Document Revised: 11/06/2015 Document Reviewed: 03/09/2017 Elsevier Interactive Patient Education  2017 Elsevier Inc[employer] 

## 2019-09-19 ENCOUNTER — Telehealth: Payer: Self-pay | Admitting: Specialist

## 2019-09-19 NOTE — Telephone Encounter (Signed)
Patient called for Dr. Otho Ket assistant Alyse Low. Patient has questions for her only. Patient phone number is [phone].

## 2019-09-19 NOTE — Telephone Encounter (Signed)
Kristina Adams,  This patient would like to establish primary care with Dr. Junius Roads.  Can you please call her to schedule an appointment?

## 2019-09-20 NOTE — Telephone Encounter (Signed)
I called and left a message on the patient's voice mail to call us back - whoever she gets on the phone can schedule her an appointment to establish primary care with Dr. Junius Roads (40-minute appointment).

## 2019-09-24 ENCOUNTER — Ambulatory Visit (INDEPENDENT_AMBULATORY_CARE_PROVIDER_SITE_OTHER): Payer: Federal, State, Local not specified - PPO | Admitting: Family Medicine

## 2019-09-24 ENCOUNTER — Encounter: Payer: Self-pay | Admitting: Family Medicine

## 2019-09-24 ENCOUNTER — Other Ambulatory Visit: Payer: Self-pay

## 2019-09-24 VITALS — BP 189/87 | HR 75 | Ht 70.5 in | Wt 342.4 lb

## 2019-09-24 DIAGNOSIS — I10 Essential (primary) hypertension: Secondary | ICD-10-CM | POA: Diagnosis not present

## 2019-09-24 DIAGNOSIS — K219 Gastro-esophageal reflux disease without esophagitis: Secondary | ICD-10-CM

## 2019-09-24 DIAGNOSIS — K582 Mixed irritable bowel syndrome: Secondary | ICD-10-CM

## 2019-09-24 DIAGNOSIS — E119 Type 2 diabetes mellitus without complications: Secondary | ICD-10-CM

## 2019-09-24 DIAGNOSIS — Z0000 Encounter for general adult medical examination without abnormal findings: Secondary | ICD-10-CM

## 2019-09-24 DIAGNOSIS — E0590 Thyrotoxicosis, unspecified without thyrotoxic crisis or storm: Secondary | ICD-10-CM | POA: Diagnosis not present

## 2019-09-24 DIAGNOSIS — Z794 Long term (current) use of insulin: Secondary | ICD-10-CM

## 2019-09-24 DIAGNOSIS — E559 Vitamin D deficiency, unspecified: Secondary | ICD-10-CM

## 2019-09-24 DIAGNOSIS — E785 Hyperlipidemia, unspecified: Secondary | ICD-10-CM

## 2019-09-24 DIAGNOSIS — Z6841 Body Mass Index (BMI) 40.0 and over, adult: Secondary | ICD-10-CM

## 2019-09-24 DIAGNOSIS — J302 Other seasonal allergic rhinitis: Secondary | ICD-10-CM

## 2019-09-24 DIAGNOSIS — E1142 Type 2 diabetes mellitus with diabetic polyneuropathy: Secondary | ICD-10-CM

## 2019-09-24 DIAGNOSIS — G4730 Sleep apnea, unspecified: Secondary | ICD-10-CM | POA: Diagnosis not present

## 2019-09-24 DIAGNOSIS — R300 Dysuria: Secondary | ICD-10-CM

## 2019-09-24 MED ORDER — VITAMIN D-3 125 MCG (5000 UT) PO TABS: 1.0000 | ORAL_TABLET | Freq: Every day | ORAL | 3 refills | Status: AC

## 2019-09-24 MED ORDER — METHIMAZOLE 5 MG PO TABS: 5.0000 mg | ORAL_TABLET | Freq: Two times a day (BID) | ORAL | 1 refills | Status: DC

## 2019-09-24 MED ORDER — LISINOPRIL 40 MG PO TABS: 40.0000 mg | ORAL_TABLET | Freq: Every day | ORAL | 1 refills | Status: DC

## 2019-09-24 MED ORDER — GABAPENTIN 300 MG PO CAPS: 300.0000 mg | ORAL_CAPSULE | Freq: Every day | ORAL | 1 refills | Status: DC

## 2019-09-24 MED ORDER — OMEPRAZOLE 20 MG PO CPDR: 20.0000 mg | DELAYED_RELEASE_CAPSULE | Freq: Every day | ORAL | 1 refills | Status: DC

## 2019-09-24 MED ORDER — HYDROCHLOROTHIAZIDE 25 MG PO TABS: 25.0000 mg | ORAL_TABLET | Freq: Every day | ORAL | 1 refills | Status: DC

## 2019-09-24 MED ORDER — LANTUS SOLOSTAR 100 UNIT/ML ~~LOC~~ SOPN: 80.0000 [IU] | PEN_INJECTOR | Freq: Every day | SUBCUTANEOUS | 1 refills | Status: DC

## 2019-09-24 MED ORDER — GLIPIZIDE 5 MG PO TABS: 5.0000 mg | ORAL_TABLET | Freq: Two times a day (BID) | ORAL | 1 refills | Status: DC

## 2019-09-24 MED ORDER — FENOFIBRATE 160 MG PO TABS: 160.0000 mg | ORAL_TABLET | Freq: Every day | ORAL | 1 refills | Status: DC

## 2019-09-24 NOTE — Patient Instructions (Signed)
    Milta Deiters Med Sinus Rinse

## 2019-09-24 NOTE — Progress Notes (Signed)
Office Visit Note   Patient: Kristina Adams           Date of Birth: 1966/03/25           MRN: [id number] Visit Date: 09/24/2019 Requested by: [name], MD [place] [place],  [place] [address] PCP: [name], MD  Subjective: No chief complaint on file.   HPI: She is here to establish care.  She has not been to her PCP in a couple years.  She has poorly controlled diabetes.  She has not had any labs checked for probably 1 year.  Her last A1c was over 9.  She has peripheral neuropathy which seems to be stable.  She does not have retinopathy or nephropathy.  She is taking 80 units of Lantus insulin along with glipizide and her blood sugars are generally above 200 when she checks them.  She has not been to her eye doctor in 2 years and is planning to get an appointment soon.  She has a history of hyperthyroidism and is now on Tapazole.  Once again she has not had her labs checked in a while.  She feels okay from the standpoint although she does complain of chronic gastrointestinal issues with alternating constipation and diarrhea for the past 2 years or so.  Hypertension is usually controlled with hydrochlorothiazide and lisinopril.  Today her blood pressure reading is elevated.  She is asymptomatic from a blood pressure standpoint.  Struggling with seasonal allergies this weel.  Also having UTI symptoms.  She is on fenofibrate for hyperlipidemia.  She takes vitamin D3 for chronic deficiency.  She has a history of obstructive sleep apnea, morbid obesity, and depression which is currently asymptomatic.  She has not been to a gynecologist in about 3 years.                ROS:   All other systems were reviewed and are negative.  Objective: Vital Signs: BP (!) 189/87 (BP Location: Left Arm, Patient Position: Sitting)   Pulse 75   Ht 5' 10.5" (1.791 m)   Wt (!) 342 lb 6.4 oz (155.3 kg)   BMI 48.43 kg/m   Physical Exam:  General:  Alert and  oriented, in no acute distress. Pulm:  Breathing unlabored. Psy:  Normal mood, congruent affect. Skin:  No lesions on feet.  HEENT:  [place]/AT, 2+ carotid pulses without bruits.  Thyroid is symmetrically enlarged with nodularity. CV: Regular rate and rhythm without murmurs, rubs, or gallops.  No peripheral edema.  2+ radial and posterior tibial pulses. Lungs: Clear to auscultation throughout with no wheezing or areas of consolidation. Abd: Bowel sounds are active, no hepatosplenomegaly or masses.  Soft and nontender.  No audible bruits.  No evidence of ascites.    Imaging: None today  Assessment & Plan: 1.  Diabetes, probably poorly controlled -Labs to evaluate.  2.  Hypertension, suboptimally controlled today -She will continue to monitor and notify me of her readings.  3. IBS - Try a food elimination diet.  Consider Enteromend and saccharomyces if this doesn't help.  4.  Thyroid dysfunction - Labs to monitor.  5.  Obesity  6.  Dysuria - Urine studies  7.  Seasonal allergies - Try sinus rinse.  8.  Vit D def - Labs today     Procedures: No procedures performed  No notes on file     PMFS History: Patient Active Problem List   Diagnosis Date Noted  .  Seasonal allergies 09/24/2019  . Irritable bowel syndrome with both constipation and diarrhea 09/24/2019  . Vitamin D deficiency 09/24/2019  . Benign essential hypertension 11/05/2015  . Gastroesophageal reflux disease 11/05/2015  . Uncontrolled type 2 diabetes mellitus with diabetic polyneuropathy, with long-term current use of insulin ([place]) 11/05/2015  . Multinodular goiter 11/06/2013  . Obesity, Class III, BMI 40-49.9 (morbid obesity) ([facility]) 05/23/2013  . 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 Diabetic neuropathy ([place]) 12/13/2011  . Iron deficiency anemia 12/09/2011  . Sleep apnea 12/03/2011  . Morbid obesity with BMI of 45.0-49.9, adult ([place]) 12/03/2011  . Chronic back pain 12/03/2011  . Hypertension  12/03/2011  . Depression 12/03/2011  . Appendicitis, acute 12/03/2011  . Diabetes mellitus type 2, insulin dependent ([place]) 12/03/2011  . DM (diabetes mellitus) ([place]) 10/06/2011  . HYPOGLYCEMIA 08/22/2010  . ALLERGIC RHINITIS CAUSE UNSPECIFIED 08/22/2010  . Thyrotoxicosis 06/27/2010  . DIABETES MELLITUS, TYPE II, UNCONTROLLED 06/27/2010  . Hyperlipidemia 06/27/2010  . MORBID OBESITY 06/27/2010  . Essential hypertension 06/27/2010  . PERS HX NONCOMPLIANCE W/MED TX PRS HAZARDS HLTH 06/27/2010  . NONSPECIFIC ABN FINDING RAD & OTH EXAM GI TRACT 02/26/2009  . NEOPLASM UNCERTAIN BHV OTH&UNSPEC DIGESTIVE ORGN 02/26/2008   Past Medical History:  Diagnosis Date  . Anemia 12/09/2011   child birth  . Appendicitis, acute 12/03/2011  . Chronic back pain 12/03/2011  . Degenerative disc disease, cervical    low back area  . Diabetes mellitus type 2, insulin dependent ([place]) 12/03/2011  . GERD (gastroesophageal reflux disease)   . H/O hiatal hernia   . Hypertension 12/03/2011  . Morbid obesity with BMI of 45.0-49.9, adult ([facility]) 12/03/2011  . Sleep apnea 12/03/2011   last sleep study May 2013  . Thyroid disease     Family History  Problem Relation Age of Onset  . Cancer Mother 71       breast cancer   . Kidney failure Father   . Heart disease Father        No details  . Diabetes Other   . Cancer Other   . Hypertension Other     Past Surgical History:  Procedure Laterality Date  . CESAREAN SECTION    . LAPAROSCOPIC APPENDECTOMY  12/03/2011   Procedure: APPENDECTOMY LAPAROSCOPIC;  Surgeon: [name], DO;  [facility];  Service: General;  Laterality: N/A;  drainage of intra-abdominal abscess   . PARS PLANA VITRECTOMY Left 08/17/2012   Procedure: PARS PLANA VITRECTOMY WITH 25 GAUGE;  Surgeon: [name], MD;  Location: [facility];  Service: Ophthalmology;  Laterality: Left;  . 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   Procedure: REPAIR OF COMPLEX TRACTION RETINAL  DETACHMENT;  Surgeon: [name], MD;  Location: [facility];  Service: Ophthalmology;  Laterality: Left;  . TUBAL LIGATION    . VITRECTOMY     Social History   Occupational History  . Not on file  Tobacco Use  . Smoking status: Never Smoker  . Smokeless tobacco: Never Used  Substance and Sexual Activity  . Alcohol use: Yes    Alcohol/week: 1.0 standard drinks    Types: 1 Glasses of wine per week  . Drug use: No  . Sexual activity: Yes    Birth control/protection: Pill

## 2019-09-25 ENCOUNTER — Telehealth: Payer: Self-pay | Admitting: Family Medicine

## 2019-09-25 DIAGNOSIS — Z794 Long term (current) use of insulin: Secondary | ICD-10-CM

## 2019-09-25 DIAGNOSIS — E119 Type 2 diabetes mellitus without complications: Secondary | ICD-10-CM

## 2019-09-25 DIAGNOSIS — E559 Vitamin D deficiency, unspecified: Secondary | ICD-10-CM

## 2019-09-25 DIAGNOSIS — R7982 Elevated C-reactive protein (CRP): Secondary | ICD-10-CM

## 2019-09-25 DIAGNOSIS — E785 Hyperlipidemia, unspecified: Secondary | ICD-10-CM

## 2019-09-25 LAB — CBC WITH DIFFERENTIAL/PLATELET
Absolute Monocytes: 339 cells/uL (ref 200–950)
Basophils Absolute: 83 cells/uL (ref 0–200)
Basophils Relative: 1.3 %
Eosinophils Absolute: 154 cells/uL (ref 15–500)
Eosinophils Relative: 2.4 %
HCT: 36.6 % (ref 35.0–45.0)
Hemoglobin: 11.6 g/dL — ABNORMAL LOW (ref 11.7–15.5)
Lymphs Abs: 1734 cells/uL (ref 850–3900)
MCH: 27.8 pg (ref 27.0–33.0)
MCHC: 31.7 g/dL — ABNORMAL LOW (ref 32.0–36.0)
MCV: 87.8 fL (ref 80.0–100.0)
MPV: 11.2 fL (ref 7.5–12.5)
Monocytes Relative: 5.3 %
Neutro Abs: 4090 cells/uL (ref 1500–7800)
Neutrophils Relative %: 63.9 %
Platelets: 265 10*3/uL (ref 140–400)
RBC: 4.17 10*6/uL (ref 3.80–5.10)
RDW: 13.1 % (ref 11.0–15.0)
Total Lymphocyte: 27.1 %
WBC: 6.4 10*3/uL (ref 3.8–10.8)

## 2019-09-25 LAB — LIPID PANEL
Cholesterol: 180 mg/dL (ref <200)
HDL: 41 mg/dL — ABNORMAL LOW (ref >=50)
LDL Cholesterol (Calc): 110 mg/dL (calc) — ABNORMAL HIGH
Non-HDL Cholesterol (Calc): 139 mg/dL (calc) — ABNORMAL HIGH (ref <130)
Total CHOL/HDL Ratio: 4.4 (calc) (ref <5.0)
Triglycerides: 170 mg/dL — ABNORMAL HIGH (ref <150)

## 2019-09-25 LAB — MICROALBUMIN, URINE: Microalb, Ur: 0.4 mg/dL

## 2019-09-25 LAB — URINALYSIS W MICROSCOPIC + REFLEX CULTURE
Bacteria, UA: NONE SEEN /HPF
Bilirubin Urine: NEGATIVE
Hgb urine dipstick: NEGATIVE
Hyaline Cast: NONE SEEN /LPF
Ketones, ur: NEGATIVE
Leukocyte Esterase: NEGATIVE
Nitrites, Initial: NEGATIVE
Protein, ur: NEGATIVE
RBC / HPF: NONE SEEN /HPF (ref 0–2)
Specific Gravity, Urine: 1.018 (ref 1.001–1.03)
Squamous Epithelial / HPF: NONE SEEN /HPF (ref <=5)
WBC, UA: NONE SEEN /HPF (ref 0–5)
pH: 5.5 (ref 5.0–8.0)

## 2019-09-25 LAB — THYROID PANEL WITH TSH
Free Thyroxine Index: 2.1 (ref 1.4–3.8)
T3 Uptake: 30 % (ref 22–35)
T4, Total: 6.9 ug/dL (ref 5.1–11.9)
TSH: 1.62 mIU/L

## 2019-09-25 LAB — COMPREHENSIVE METABOLIC PANEL
AG Ratio: 1.4 (calc) (ref 1.0–2.5)
ALT: 7 U/L (ref 6–29)
AST: 10 U/L (ref 10–35)
Albumin: 3.9 g/dL (ref 3.6–5.1)
Alkaline phosphatase (APISO): 66 U/L (ref 37–153)
BUN: 19 mg/dL (ref 7–25)
CO2: 29 mmol/L (ref 20–32)
Calcium: 9.1 mg/dL (ref 8.6–10.4)
Chloride: 101 mmol/L (ref 98–110)
Creat: 0.81 mg/dL (ref 0.50–1.05)
Globulin: 2.7 g/dL (calc) (ref 1.9–3.7)
Glucose, Bld: 259 mg/dL — ABNORMAL HIGH (ref 65–99)
Potassium: 4.3 mmol/L (ref 3.5–5.3)
Sodium: 139 mmol/L (ref 135–146)
Total Bilirubin: 0.3 mg/dL (ref 0.2–1.2)
Total Protein: 6.6 g/dL (ref 6.1–8.1)

## 2019-09-25 LAB — VITAMIN D 25 HYDROXY (VIT D DEFICIENCY, FRACTURES): Vit D, 25-Hydroxy: 29 ng/mL — ABNORMAL LOW (ref 30–100)

## 2019-09-25 LAB — HEMOGLOBIN A1C
Hgb A1c MFr Bld: 10.1 % of total Hgb — ABNORMAL HIGH (ref <5.7)
Mean Plasma Glucose: 243 (calc)
eAG (mmol/L): 13.5 (calc)

## 2019-09-25 LAB — HIGH SENSITIVITY CRP: hs-CRP: >10 mg/L — ABNORMAL HIGH

## 2019-09-25 LAB — NO CULTURE INDICATED

## 2019-09-25 NOTE — Telephone Encounter (Signed)
Patient aware.

## 2019-09-25 NOTE — Telephone Encounter (Signed)
Labs are notable for the following:  Blood glucose is elevated at 259 and hemoglobin A1c is 10.1.  I have sent an email with dietary recommendations to help get this under better control.  I would like to recheck in about 3 months and if not making progress, then we will consider medication changes.  But I really feel that lifestyle changes will do the trick.  C-reactive protein, inflammation marker, is elevated at greater than 10.  This is a nonspecific test, but will likely improve with lifestyle modifications.  We will recheck this in 3 to 4 months as well and if it is still elevated, we may do some additional lab testing.  Lipid panel is abnormal but should improve with lifestyle changes as above.  Thyroid studies look perfect.  Urinalysis is normal except for presence of glucose.  No sign of infection.  Urinary symptoms are most likely due to poorly controlled diabetes.  Vitamin D is low at 29.  Target level is 50-80.  I recommend taking an additional 5000 IU daily and we will recheck in 3 to 4 months.  All else looks good.

## 2019-10-01 ENCOUNTER — Other Ambulatory Visit: Payer: Self-pay | Admitting: Family Medicine

## 2019-10-01 DIAGNOSIS — D259 Leiomyoma of uterus, unspecified: Secondary | ICD-10-CM | POA: Insufficient documentation

## 2019-10-01 DIAGNOSIS — D649 Anemia, unspecified: Secondary | ICD-10-CM | POA: Insufficient documentation

## 2019-10-01 DIAGNOSIS — R109 Unspecified abdominal pain: Secondary | ICD-10-CM | POA: Insufficient documentation

## 2019-10-02 ENCOUNTER — Encounter: Payer: Self-pay | Admitting: Family Medicine

## 2019-10-02 ENCOUNTER — Telehealth: Payer: Self-pay

## 2019-10-02 NOTE — Telephone Encounter (Signed)
The patient had sent a MyChart message earlier asking for a call back to discuss lab results. She could not read the message from Dr. Junius Roads, only the actual lab results. I advised her of her high CBG, HgbA1C, lipid panel and CRP. She did receive the diet via email from Dr. Junius Roads and will try to follow this. She will start Vitamin D3 for her low vitamin D level, at 5,000 iu daily. Recheck these blood tests in 3 months. Also, the urine showed no infection, but it did have glucose in it - the dietary changes should help with her symptoms of frequency, burning and fatigue. She will let us know if the urinary symptoms do not resolve or worsen (has had UTIs in the past). The patient voiced understanding on all these results.

## 2019-10-04 ENCOUNTER — Ambulatory Visit: Payer: Federal, State, Local not specified - PPO | Admitting: Specialist

## 2019-10-12 ENCOUNTER — Other Ambulatory Visit: Payer: Self-pay | Admitting: Family Medicine

## 2019-10-12 DIAGNOSIS — Z1231 Encounter for screening mammogram for malignant neoplasm of breast: Secondary | ICD-10-CM

## 2019-10-12 DIAGNOSIS — Z0000 Encounter for general adult medical examination without abnormal findings: Secondary | ICD-10-CM

## 2019-10-18 ENCOUNTER — Ambulatory Visit: Payer: Federal, State, Local not specified - PPO

## 2019-10-22 ENCOUNTER — Ambulatory Visit: Payer: Federal, State, Local not specified - PPO

## 2019-10-24 ENCOUNTER — Other Ambulatory Visit: Payer: Self-pay

## 2019-10-24 ENCOUNTER — Ambulatory Visit
Admission: RE | Admit: 2019-10-24 | Discharge: 2019-10-24 | Disposition: A | Discharge status: Home / Self Care | Payer: Federal, State, Local not specified - PPO | Source: Ambulatory Visit | Attending: Family Medicine | Admitting: Family Medicine

## 2019-10-24 DIAGNOSIS — Z1231 Encounter for screening mammogram for malignant neoplasm of breast: Secondary | ICD-10-CM

## 2019-10-25 ENCOUNTER — Telehealth: Payer: Self-pay | Admitting: Family Medicine

## 2019-10-25 NOTE — Telephone Encounter (Signed)
Mammogram is normal/negative.

## 2019-11-08 ENCOUNTER — Ambulatory Visit: Payer: Federal, State, Local not specified - PPO | Admitting: Specialist

## 2019-11-08 ENCOUNTER — Telehealth: Payer: Self-pay | Admitting: Family Medicine

## 2019-11-08 NOTE — Telephone Encounter (Signed)
Patient would like for you to give her a call back, she has some questions about some tests results.  CB#[phone].  Thank you.

## 2019-11-09 NOTE — Telephone Encounter (Signed)
Patient was wondering about her mammogram, but as I called her back she got the result paper in the  mail

## 2020-07-07 ENCOUNTER — Other Ambulatory Visit: Payer: Self-pay | Admitting: Family Medicine

## 2020-08-13 ENCOUNTER — Other Ambulatory Visit: Payer: Self-pay | Admitting: Family Medicine

## 2020-08-20 ENCOUNTER — Other Ambulatory Visit: Payer: Self-pay | Admitting: Family Medicine

## 2020-09-29 ENCOUNTER — Other Ambulatory Visit: Payer: Self-pay | Admitting: Pharmacist

## 2020-09-29 ENCOUNTER — Ambulatory Visit: Payer: Self-pay | Attending: Nurse Practitioner | Admitting: Nurse Practitioner

## 2020-09-29 ENCOUNTER — Other Ambulatory Visit: Payer: Self-pay

## 2020-09-29 ENCOUNTER — Encounter: Payer: Self-pay | Admitting: Nurse Practitioner

## 2020-09-29 ENCOUNTER — Other Ambulatory Visit: Payer: Self-pay | Admitting: Family Medicine

## 2020-09-29 VITALS — BP 153/81 | HR 71 | Resp 16 | Ht 70.0 in | Wt 318.4 lb

## 2020-09-29 DIAGNOSIS — Z114 Encounter for screening for human immunodeficiency virus [HIV]: Secondary | ICD-10-CM

## 2020-09-29 DIAGNOSIS — E785 Hyperlipidemia, unspecified: Secondary | ICD-10-CM

## 2020-09-29 DIAGNOSIS — E1165 Type 2 diabetes mellitus with hyperglycemia: Secondary | ICD-10-CM

## 2020-09-29 DIAGNOSIS — K219 Gastro-esophageal reflux disease without esophagitis: Secondary | ICD-10-CM

## 2020-09-29 DIAGNOSIS — E042 Nontoxic multinodular goiter: Secondary | ICD-10-CM

## 2020-09-29 DIAGNOSIS — R0981 Nasal congestion: Secondary | ICD-10-CM

## 2020-09-29 DIAGNOSIS — Z1211 Encounter for screening for malignant neoplasm of colon: Secondary | ICD-10-CM

## 2020-09-29 DIAGNOSIS — N760 Acute vaginitis: Secondary | ICD-10-CM

## 2020-09-29 DIAGNOSIS — Z1159 Encounter for screening for other viral diseases: Secondary | ICD-10-CM

## 2020-09-29 DIAGNOSIS — M2550 Pain in unspecified joint: Secondary | ICD-10-CM

## 2020-09-29 DIAGNOSIS — E0590 Thyrotoxicosis, unspecified without thyrotoxic crisis or storm: Secondary | ICD-10-CM

## 2020-09-29 DIAGNOSIS — E119 Type 2 diabetes mellitus without complications: Secondary | ICD-10-CM

## 2020-09-29 DIAGNOSIS — I10 Essential (primary) hypertension: Secondary | ICD-10-CM

## 2020-09-29 DIAGNOSIS — D508 Other iron deficiency anemias: Secondary | ICD-10-CM

## 2020-09-29 DIAGNOSIS — Z794 Long term (current) use of insulin: Secondary | ICD-10-CM

## 2020-09-29 DIAGNOSIS — F5101 Primary insomnia: Secondary | ICD-10-CM

## 2020-09-29 LAB — POCT GLYCOSYLATED HEMOGLOBIN (HGB A1C): HbA1c, POC (controlled diabetic range): 9.2 % — AB (ref 0.0–7.0)

## 2020-09-29 LAB — GLUCOSE, POCT (MANUAL RESULT ENTRY): POC Glucose: 296 mg/dl — AB (ref 70–99)

## 2020-09-29 MED ORDER — CYCLOBENZAPRINE HCL 10 MG PO TABS
10.0000 mg | ORAL_TABLET | Freq: Three times a day (TID) | ORAL | 1 refills | Status: DC | PRN
Start: 2020-09-29 — End: 2021-11-27

## 2020-09-29 MED ORDER — GABAPENTIN 300 MG PO CAPS: 300.0000 mg | ORAL_CAPSULE | Freq: Every day | ORAL | 1 refills | Status: DC

## 2020-09-29 MED ORDER — TRULICITY 1.5 MG/0.5ML ~~LOC~~ SOAJ: 1.5000 mg | SUBCUTANEOUS | 1 refills | Status: DC

## 2020-09-29 MED ORDER — FLUTICASONE PROPIONATE 50 MCG/ACT NA SUSP: 2.0000 | Freq: Every day | NASAL | 1 refills | Status: DC

## 2020-09-29 MED ORDER — METHIMAZOLE 5 MG PO TABS: 5.0000 mg | ORAL_TABLET | Freq: Two times a day (BID) | ORAL | 3 refills | Status: DC

## 2020-09-29 MED ORDER — FENOFIBRATE 160 MG PO TABS: 160.0000 mg | ORAL_TABLET | Freq: Every day | ORAL | 1 refills | Status: DC

## 2020-09-29 MED ORDER — LANTUS SOLOSTAR 100 UNIT/ML ~~LOC~~ SOPN: 80.0000 [IU] | PEN_INJECTOR | Freq: Every day | SUBCUTANEOUS | 1 refills | Status: DC

## 2020-09-29 MED ORDER — GLIPIZIDE 5 MG PO TABS: ORAL_TABLET | ORAL | 0 refills | Status: DC

## 2020-09-29 MED ORDER — BD PEN NEEDLE MINI U/F 31G X 5 MM MISC: 6 refills | Status: DC

## 2020-09-29 MED ORDER — TRAZODONE HCL 100 MG PO TABS: 50.0000 mg | ORAL_TABLET | Freq: Every day | ORAL | 0 refills | Status: DC

## 2020-09-29 MED ORDER — OMEPRAZOLE 40 MG PO CPDR: 40.0000 mg | DELAYED_RELEASE_CAPSULE | Freq: Every day | ORAL | 1 refills | Status: DC

## 2020-09-29 MED ORDER — HYDROCHLOROTHIAZIDE 25 MG PO TABS: 25.0000 mg | ORAL_TABLET | Freq: Every day | ORAL | 1 refills | Status: DC

## 2020-09-29 MED ORDER — LISINOPRIL 40 MG PO TABS
40.0000 mg | ORAL_TABLET | Freq: Every day | ORAL | 1 refills | Status: DC
  Filled 2020-10-29: qty 30, 30d supply, fill #0
  Filled 2020-12-01: qty 30, 30d supply, fill #1
  Filled 2021-01-07 – 2021-01-19 (×2): qty 30, 30d supply, fill #2
  Filled 2021-02-18: qty 30, 30d supply, fill #3
  Filled 2021-03-27 – 2021-04-03 (×2): qty 30, 30d supply, fill #4
  Filled 2021-05-05 – 2021-05-15 (×2): qty 30, 30d supply, fill #5

## 2020-09-29 MED ORDER — FLUTICASONE PROPIONATE 50 MCG/ACT NA SUSP
NASAL | 1 refills | Status: DC
Start: 2020-09-29 — End: 2020-09-29

## 2020-09-29 MED ORDER — DICLOFENAC SODIUM 1 % EX GEL: 4.0000 g | Freq: Four times a day (QID) | CUTANEOUS | 6 refills | Status: DC

## 2020-09-29 MED FILL — !BASAGLAR 100 UNIT/ML KWIK: 100 | 15 days supply | Qty: 12 | Fill #0

## 2020-09-29 MED FILL — !TRULICITY 1.5 MG/0.5 ML PE: 1.5 | 28 days supply | Qty: 2 | Fill #0

## 2020-09-29 MED FILL — DICLOFENAC SODIUM 1% GEL: 1 | 25 days supply | Qty: 300 | Fill #0

## 2020-09-29 MED FILL — TRUEPLUS 5-BEVEL PEN NEEDLE: 31G X 5 MM | 50 days supply | Qty: 100 | Fill #0

## 2020-09-29 MED FILL — HYDROCHLOROTHIAZIDE 25 MG T: 25 | 30 days supply | Qty: 30 | Fill #0

## 2020-09-29 MED FILL — TRAZODONE HCL 100 MG TABS: 100 | 30 days supply | Qty: 30 | Fill #0

## 2020-09-29 MED FILL — LISINOPRIL 40 MG TAB: 40 | 30 days supply | Qty: 30 | Fill #0

## 2020-09-29 MED FILL — glipiZIDE 5 MG TABS: 5 | 30 days supply | Qty: 60 | Fill #0

## 2020-09-29 MED FILL — CYCLOBENZAPRINE 10 MG TAB: 10 | 10 days supply | Qty: 30 | Fill #0

## 2020-09-29 MED FILL — METHIMAZOLE 5 MG TABS: 5 | 30 days supply | Qty: 60 | Fill #0

## 2020-09-29 MED FILL — FENOFIBRATE 160 MG TABLET: 160 | 30 days supply | Qty: 30 | Fill #0

## 2020-09-29 MED FILL — GABAPENTIN 300 MG CAPSULE: 300 | 30 days supply | Qty: 30 | Fill #0

## 2020-09-29 MED FILL — OMEPRAZOLE DR 40 MG CAPSULE: 40 | 30 days supply | Qty: 30 | Fill #0

## 2020-09-29 NOTE — Progress Notes (Signed)
Assessment & Plan:  Kristina Adams was seen today for new patient (initial visit), diabetes and hypertension.  Diagnoses and all orders for this visit:  Type 2 diabetes mellitus with hyperglycemia, with long-term current use of insulin (HCC) -     POCT glucose (manual entry) -     POCT glycosylated hemoglobin (Hb A1C) -     Microalbumin / creatinine urine ratio -     glipiZIDE (GLUCOTROL) 5 MG tablet; TAKE 1 TABLET BY MOUTH TWICE DAILY BEFORE MEAL. NEEDS PASS -     Dulaglutide (TRULICITY) 1.5 VX/4.8AX SOPN; Inject 1.5 mg into the skin once a week. -     Insulin Pen Needle (B-D UF III MINI PEN NEEDLES) 31G X 5 MM MISC; Use as instructed. Inject into the skin twice daily. NEEDS PASS -     insulin glargine (LANTUS SOLOSTAR) 100 UNIT/ML Solostar Pen; Inject 80 Units into the skin daily. NEEDS PASS Continue blood sugar control as discussed in office today, low carbohydrate diet, and regular physical exercise as tolerated, 150 minutes per week (30 min each day, 5 days per week, or 50 min 3 days per week). Keep blood sugar logs with fasting goal of 90-130 mg/dl, post prandial (after you eat) less than 180.  For Hypoglycemia: BS <60 and Hyperglycemia BS >400; contact the clinic ASAP. Annual eye exams and foot exams are recommended.   Essential hypertension -     lisinopril (ZESTRIL) 40 MG tablet; Take 1 tablet (40 mg total) by mouth daily. NEEDS PASS -     hydrochlorothiazide (HYDRODIURIL) 25 MG tablet; Take 1 tablet (25 mg total) by mouth daily. NEEDS PASS -     CMP14+EGFR INSTRUCTIONS: Work on a low fat, heart healthy diet and participate in regular aerobic exercise program by working out at least 150 minutes per week; 5 days a week-30 minutes per day. Avoid red meat/beef/steak,  fried foods. junk foods, sodas, sugary drinks, unhealthy snacking, alcohol and smoking.  Drink at least 80 oz of water per day and monitor your carbohydrate intake daily.   Dyslipidemia, goal LDL below 70 -     fenofibrate  160 MG tablet; Take 1 tablet (160 mg total) by mouth daily. NEEDS PASS -     Lipid panel INSTRUCTIONS: Work on a low fat, heart healthy diet and participate in regular aerobic exercise program by working out at least 150 minutes per week; 5 days a week-30 minutes per day. Avoid red meat/beef/steak,  fried foods. junk foods, sodas, sugary drinks, unhealthy snacking, alcohol and smoking.  Drink at least 80 oz of water per day and monitor your carbohydrate intake daily.    Thyrotoxicosis without thyroid storm, unspecified thyrotoxicosis type -     methimazole (TAPAZOLE) 5 MG tablet; Take 1 tablet (5 mg total) by mouth 2 (two) times daily. NEEDS PASS -     TSH  Multinodular goiter -     methimazole (TAPAZOLE) 5 MG tablet; Take 1 tablet (5 mg total) by mouth 2 (two) times daily. NEEDS PASS  GERD without esophagitis -     omeprazole (PRILOSEC) 40 MG capsule; Take 1 capsule (40 mg total) by mouth daily. NEEDS PASS INSTRUCTIONS: Avoid GERD Triggers: acidic, spicy or fried foods, caffeine, coffee, sodas,  alcohol and chocolate.   Primary insomnia -     traZODone (DESYREL) 100 MG tablet; Take 0.5-1 tablets (50-100 mg total) by mouth at bedtime. NEEDS PASS  Acute vaginitis -     Cervicovaginal ancillary only -  Urinalysis, Routine w reflex microscopic  Encounter for screening for HIV -     HIV antibody (with reflex)  Colon cancer screening -     Fecal occult blood, imunochemical  Need for hepatitis C screening test -     HCV Ab w Reflex to Quant PCR  Other iron deficiency anemia -     CBC  Arthralgia of multiple sites -     gabapentin (NEURONTIN) 300 MG capsule; Take 1 capsule (300 mg total) by mouth at bedtime. NEEDS PASS -     diclofenac Sodium (VOLTAREN) 1 % GEL; Apply 4 g topically 4 (four) times daily. -     cyclobenzaprine (FLEXERIL) 10 MG tablet; Take 1 tablet (10 mg total) by mouth 3 (three) times daily as needed for muscle spasms.  Nasal congestion -     Discontinue:  fluticasone (FLONASE) 50 MCG/ACT nasal spray; fluticasone propionate 50 mcg/actuation nasal spray,suspension    Patient has been counseled on age-appropriate routine health concerns for screening and prevention. These are reviewed and up-to-date. Referrals have been placed accordingly. Immunizations are up-to-date or declined.    Subjective:   Chief Complaint  Patient presents with  . New Patient (Initial Visit)  . Diabetes  . Hypertension   HPI Kristina Adams 55 y.o. female presents to office today to establish care  She has a past medical history of Anemia (12/09/2011), Appendicitis, acute with appendectomy (12/03/2011), Chronic back pain (12/03/2011), Degenerative disc disease, cervical, Diabetes mellitus type 2, insulin dependent (12/03/2011), GERD, H/O hiatal hernia, Hypertension (12/03/2011), Morbid obesity with BMI of 45.0-49.9, adult (12/03/2011), Sleep apnea (12/03/2011), and Thyroid disease.   CPAP She has a history of OSA but has never used or owned a CPAP. Will need sleep study. She does endorse waking up gasping for breaths at night. Patient has been advised to apply for financial assistance and schedule to see our financial counselor.    Essential Hypertension Poorly controlled. She has not been taking her HCTZ 25 mg daily and only taking lisinopril 40 mg. She will begin taking HCTZ and we will have her return in 4 weeks for BP check. Denies chest pain, shortness of breath, palpitations, lightheadedness, dizziness, headaches or BLE edema. She may need amlodipine as well based on f/u BP.  BP Readings from Last 3 Encounters:  09/29/20 (!) 153/81  09/24/19 (!) 189/87  09/10/19 (!) 142/61   DM 2 A1c poorly controlled. BMI 45. She states she has been trying to watch what she eats. I have asked her to start a food diary so that we can assess why her glucose levels are so high. Meter readings averaging 200-300s. She is taking lantus 80 units in the am and glipizide 5 mg BID. I am adding  trulicity 1.5 units today. LDL not at goal. Will likely add statin. She believes her A1c and glucose levels are elevated because of stress and her having chronic "Urinary tract infections". I can not locate any evidence of repeated UTIs in her chart. Also highly unlikely this would be contributing to her numbers.  There may likely be a dietary component however we review her food diary at next office visit.  Lab Results  Component Value Date   HGBA1C 9.2 (A) 09/29/2020   Lab Results  Component Value Date   LDLCALC 110 (H) 09/24/2019    GERD She is no longer taking omeprazole prescription and is now taking over the counter omeprazole. Endorses left sided upper quadrant pain which is usually relieved when she  burps or passes flatus. She has tried gas x, tums and OTC counter antigas medications with no relief. She does have a history of IBS as well. Endorses mushy stools which have changed in consistency over the past 3 weeks. Associated symptoms include nausea, epigastric pain and indigestion,  She had a colonoscopy at the age of 17 with polyps removed. She also had an endsocopy but can not recall the results, feedback or return follow up for either procedure. She is unable to tell me why she needed a colonoscopy or endoscopy in her 61s.  She has tried to make changes in her diet: kimited cheese, stopped eating regular yogurt and is now eats greek yogurt and drinks almond milk. She will likely need to see a gastroenterologist.    Insomnia  Taking OTC benadryl, gabapentin and melatonin together in order to fall asleep and stay asleep.    Hyperthyroidism She has a large goiter and states she was also diagnosed with thyroid nodules which required biopsy years ago. She states surgery was not recommended. She is currrently prescribed tapazole and ran out Saturday.  Lab Results  Component Value Date   TSH 1.62 09/24/2019     Depression screen PHQ 2/9 09/29/2020  Decreased Interest 1  Down,  Depressed, Hopeless 0  PHQ - 2 Score 1    .Review of Systems  Constitutional: Negative for fever, malaise/fatigue and weight loss.  HENT: Positive for congestion. Negative for nosebleeds.   Eyes: Negative.  Negative for blurred vision, double vision and photophobia.  Respiratory: Negative.  Negative for cough, shortness of breath and wheezing.   Cardiovascular: Negative.  Negative for chest pain, palpitations and leg swelling.  Gastrointestinal: Positive for abdominal pain, heartburn and nausea. Negative for blood in stool, constipation, diarrhea, melena and vomiting.  Genitourinary: Positive for flank pain.  Musculoskeletal: Positive for joint pain. Negative for myalgias.  Neurological: Negative.  Negative for dizziness, focal weakness, seizures and headaches.  Psychiatric/Behavioral: Negative for suicidal ideas. The patient has insomnia.     Past Medical History:  Diagnosis Date  . Anemia 12/09/2011   child birth  . Appendicitis, acute 12/03/2011  . Chronic back pain 12/03/2011  . Degenerative disc disease, cervical    low back area  . Diabetes mellitus type 2, insulin dependent ([place]) 12/03/2011  . GERD (gastroesophageal reflux disease)   . H/O hiatal hernia   . Hypertension 12/03/2011  . Morbid obesity with BMI of 45.0-49.9, adult ([place]) 12/03/2011  . Sleep apnea 12/03/2011   last sleep study May 2013  . Thyroid disease     Past Surgical History:  Procedure Laterality Date  . CESAREAN SECTION    . LAPAROSCOPIC APPENDECTOMY  12/03/2011   Procedure: APPENDECTOMY LAPAROSCOPIC;  Surgeon: [name], DO;  Location: [facility];  Service: General;  Laterality: N/A;  drainage of intra-abdominal abscess   . PARS PLANA VITRECTOMY Left 08/17/2012   Procedure: PARS PLANA VITRECTOMY WITH 25 GAUGE;  Surgeon: [name], MD;  Location: [facility];  Service: Ophthalmology;  Laterality: Left;  . 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 TUBAL LIGATION    . VITRECTOMY      Family History  Problem Relation Age of Onset  . Cancer Mother 4       breast cancer   . Breast cancer Mother   . Kidney failure  Father   . Heart disease Father        No details  . Diabetes Other   . Cancer Other   . Hypertension Other   . Breast cancer Maternal Grandmother     Social History Reviewed with no changes to be made today.   Outpatient Medications Prior to Visit  Medication Sig Dispense Refill  . Cholecalciferol (VITAMIN D-3) 125 MCG (5000 UT) TABS Take 1 tablet by mouth daily. 90 tablet 3  . vitamin C (ASCORBIC ACID) 500 MG tablet Take 1,000 mg by mouth daily.    Marland Kitchen glipiZIDE (GLUCOTROL) 5 MG tablet TAKE 1 TABLET BY MOUTH TWICE DAILY BEFORE MEAL(S) 180 tablet 0  . insulin glargine (LANTUS SOLOSTAR) 100 UNIT/ML Solostar Pen Inject 80 Units into the skin daily. 75 mL 1  . lisinopril (ZESTRIL) 40 MG tablet Take 1 tablet by mouth once daily 90 tablet 0  . methimazole (TAPAZOLE) 5 MG tablet Take 1 tablet (5 mg total) by mouth 2 (two) times daily. 180 tablet 1  . Multiple Vitamin (MULITIVITAMIN WITH MINERALS) TABS Take 1 tablet by mouth daily.    . cyclobenzaprine (FLEXERIL) 10 MG tablet Take 10 mg by mouth 3 (three) times daily as needed for muscle spasms.  (Patient not taking: Reported on 09/29/2020)    . diclofenac Sodium (VOLTAREN) 1 % GEL Apply 4 g topically 4 (four) times daily. (Patient not taking: No sig reported) 350 g 6  . fenofibrate 160 MG tablet Take 1 tablet (160 mg total) by mouth daily. (Patient not taking: Reported on 09/29/2020) 90 tablet 1  . fluticasone (FLONASE) 50 MCG/ACT nasal spray fluticasone propionate 50 mcg/actuation nasal spray,suspension (Patient not taking: Reported on 09/29/2020)    . gabapentin (NEURONTIN) 300 MG capsule Take 1 capsule (300 mg total) by mouth at bedtime. (Patient not taking:  Reported on 09/29/2020) 90 capsule 1  . hydrochlorothiazide (HYDRODIURIL) 25 MG tablet Take 1 tablet (25 mg total) by mouth daily. (Patient not taking: Reported on 09/29/2020) 90 tablet 1  . Hydroquinone-Sunscreens (ESOTERICA FACIAL) 2 % CREA Esoterica Fade 2 % topical cream (Patient not taking: Reported on 09/29/2020)    . omeprazole (PRILOSEC) 20 MG capsule omeprazole 20 mg capsule,delayed release (Patient not taking: Reported on 09/29/2020)    . omeprazole (PRILOSEC) 20 MG capsule Take 1 capsule (20 mg total) by mouth daily. (Patient not taking: Reported on 09/29/2020) 90 capsule 1  . traMADol-acetaminophen (ULTRACET) 37.5-325 MG tablet Take 1 tablet by mouth every 6 (six) hours as needed for moderate pain. (Patient not taking: Reported on 09/24/2019) 30 tablet 0  . lidocaine (PF) (XYLOCAINE) 1 % injection 0.3 mL      No facility-administered medications prior to visit.    Allergies  Allergen Reactions  . Latex Other (See Comments)    Infection in left middle finger from latex gloves due to moisture.  . Iohexol      Code: RASH, Desc: PATIENT CALLED 06/17/08-STATED AFTER 6 HOURS POST IV CONTRAST, HANDS,SCALP AND GROIN AREA DEVELOPED ITCHING,BURNING SENSATION, Onset Date: [id number]        Objective:    BP (!) 153/81   Pulse 71   Resp 16   Ht _0  (1.778 m)   Wt (!) 318 lb 6.4 oz (144.4 kg)   SpO2 98%   BMI 45.69 kg/m  Wt Readings from Last 3 Encounters:  09/29/20 (!) 318 lb 6.4 oz (144.4 kg)  09/24/19 (!) 342 lb 6.4 oz (155.3 kg)  09/10/19 (!) 345 lb 3.2  oz (156.6 kg)    Physical Exam       Patient has been counseled extensively about nutrition and exercise as well as the importance of adherence with medications and regular follow-up. The patient was given clear instructions to go to ER or return to medical center if symptoms don't improve, worsen or new problems develop. The patient verbalized understanding.   Follow-up: Return for 4 wk f/u LUKE BP check and meter check. SEE  ME in 3 months.   Gildardo Pounds, FNP-BC [facility] and [facility] [place], [place]   09/29/2020, 5:00 PM

## 2020-09-29 NOTE — Patient Instructions (Signed)
[phone] Pls call to schedule your free mammogram

## 2020-09-30 LAB — LIPID PANEL
Chol/HDL Ratio: 3.9 ratio (ref 0.0–4.4)
Cholesterol, Total: 191 mg/dL (ref 100–199)
HDL: 49 mg/dL (ref >39)
LDL Chol Calc (NIH): 119 mg/dL — ABNORMAL HIGH (ref 0–99)
Triglycerides: 131 mg/dL (ref 0–149)
VLDL Cholesterol Cal: 23 mg/dL (ref 5–40)

## 2020-09-30 LAB — CERVICOVAGINAL ANCILLARY ONLY
Bacterial Vaginitis (gardnerella): POSITIVE — AB
Candida Glabrata: NEGATIVE
Candida Vaginitis: POSITIVE — AB
Chlamydia: NEGATIVE
Comment: NEGATIVE
Comment: NEGATIVE
Comment: NEGATIVE
Comment: NEGATIVE
Comment: NEGATIVE
Comment: NORMAL
Neisseria Gonorrhea: NEGATIVE
Trichomonas: NEGATIVE

## 2020-09-30 LAB — MICROALBUMIN / CREATININE URINE RATIO
Creatinine, Urine: 64.2 mg/dL
Microalb/Creat Ratio: 13 mg/g creat (ref 0–29)
Microalbumin, Urine: 8.3 ug/mL

## 2020-09-30 LAB — TSH: TSH: 1.12 u[IU]/mL (ref 0.450–4.500)

## 2020-09-30 LAB — URINALYSIS, ROUTINE W REFLEX MICROSCOPIC
Bilirubin, UA: NEGATIVE
Ketones, UA: NEGATIVE
Leukocytes,UA: NEGATIVE
Nitrite, UA: NEGATIVE
Protein,UA: NEGATIVE
RBC, UA: NEGATIVE
Specific Gravity, UA: 1.018 (ref 1.005–1.030)
Urobilinogen, Ur: 1 mg/dL (ref 0.2–1.0)
pH, UA: 6.5 (ref 5.0–7.5)

## 2020-09-30 LAB — CMP14+EGFR
ALT: 10 IU/L (ref 0–32)
AST: 8 IU/L (ref 0–40)
Albumin/Globulin Ratio: 1.6 (ref 1.2–2.2)
Albumin: 4.4 g/dL (ref 3.8–4.9)
Alkaline Phosphatase: 93 IU/L (ref 44–121)
BUN/Creatinine Ratio: 15 (ref 9–23)
BUN: 11 mg/dL (ref 6–24)
Bilirubin Total: 0.2 mg/dL (ref 0.0–1.2)
CO2: 26 mmol/L (ref 20–29)
Calcium: 9.8 mg/dL (ref 8.7–10.2)
Chloride: 98 mmol/L (ref 96–106)
Creatinine, Ser: 0.71 mg/dL (ref 0.57–1.00)
Globulin, Total: 2.7 g/dL (ref 1.5–4.5)
Glucose: 244 mg/dL — ABNORMAL HIGH (ref 65–99)
Potassium: 4.5 mmol/L (ref 3.5–5.2)
Sodium: 140 mmol/L (ref 134–144)
Total Protein: 7.1 g/dL (ref 6.0–8.5)
eGFR: 101 mL/min/{1.73_m2} (ref >59)

## 2020-09-30 LAB — HCV AB W REFLEX TO QUANT PCR: HCV Ab: <0.1 s/co ratio (ref 0.0–0.9)

## 2020-09-30 LAB — CBC
Hematocrit: 38.3 % (ref 34.0–46.6)
Hemoglobin: 12.4 g/dL (ref 11.1–15.9)
MCH: 27.3 pg (ref 26.6–33.0)
MCHC: 32.4 g/dL (ref 31.5–35.7)
MCV: 84 fL (ref 79–97)
Platelets: 316 10*3/uL (ref 150–450)
RBC: 4.54 x10E6/uL (ref 3.77–5.28)
RDW: 13.9 % (ref 11.7–15.4)
WBC: 6.7 10*3/uL (ref 3.4–10.8)

## 2020-09-30 LAB — HIV ANTIBODY (ROUTINE TESTING W REFLEX): HIV Screen 4th Generation wRfx: NONREACTIVE

## 2020-09-30 LAB — HCV INTERPRETATION

## 2020-09-30 MED FILL — FLUTICASONE PROP 50 MCG SPR: 50 | 30 days supply | Qty: 16 | Fill #0

## 2020-10-01 ENCOUNTER — Other Ambulatory Visit: Payer: Self-pay | Admitting: Nurse Practitioner

## 2020-10-01 MED ORDER — FLUCONAZOLE 150 MG PO TABS: 150.0000 mg | ORAL_TABLET | Freq: Once | ORAL | 1 refills | Status: AC

## 2020-10-01 MED ORDER — METRONIDAZOLE 500 MG PO TABS: 500.0000 mg | ORAL_TABLET | Freq: Two times a day (BID) | ORAL | 0 refills | Status: AC

## 2020-10-01 MED FILL — FLUCONAZOLE 150 MG TABLET: 150 | 1 days supply | Qty: 1 | Fill #0

## 2020-10-01 MED FILL — metroNIDAZOLE 500 MG TABS: 500 | 7 days supply | Qty: 14 | Fill #0

## 2020-10-21 ENCOUNTER — Other Ambulatory Visit: Payer: Self-pay

## 2020-10-21 MED FILL — Insulin Glargine Soln Pen-Injector 100 Unit/ML: SUBCUTANEOUS | 26 days supply | Qty: 21 | Fill #0 | Status: CN

## 2020-10-22 ENCOUNTER — Other Ambulatory Visit: Payer: Self-pay

## 2020-10-27 ENCOUNTER — Ambulatory Visit: Payer: Self-pay | Admitting: Pharmacist

## 2020-10-28 NOTE — Progress Notes (Signed)
S:    PCP: Geryl Rankins PMH: HTN, DM, morbid obesity, OSA   Patient arrives in good spirits. Presents to the clinic for hypertension and diabetes evaluation, counseling, and management. Patient was referred and last seen by Primary Care Provider on 09/29/20. At that visit, glucometer readings averaged 200-300s on Lantus 80 units daily and glipizide 5 mg BID. PCP started Trulicity 1.5 mg weekly. Additionally, BP was elevated at 153/81 and pt reported only taking lisinopril 40 mg daily and not HCTZ 25 mg daily. Patient instructed to start HCTZ 25 mg daily.  Today, patient reports medication adherence with DM and HTN medications. Requests refills for Trulicity (missed yesterday's injection), Basaglar, glizipide, lisinopril, HCTZ, fenofibrate, and methimazole. Reports mild blurry vision and nausea and acid reflux with no symptom relief with omeprazole. Denies headaches, dizziness, and LE swelling. Home FBG range 150 to 200s with a high of 316 and PPBG range 89-186 with a high of 269. Reports not having a home BP monitor to check BP.  Family/Social History:  -Fhx: heart disease and kidney failure in father -Tobacco use: current every day smoker (cigars)  Insurance coverage/medication affordability: dnbi  Medication adherence reported good .   Current diabetes medications include: Trulicity 1.5 mg weekly (Tuesdays), Basaglar 80 units daily (AM), glipizide 5 mg BID Current hypertension medications include: lisinopril 40 mg daily, HCTZ 25 mg daily Current hyperlipidemia medications include: fenofibrate 160 mg daily  Patient denies hypoglycemic events.   Patient reports nocturia (nighttime urination). 3x/week Patient reports neuropathy (nerve pain). Patient reports visual changes. Patient reports self foot exams.     O:  POCT: 116 (s/p yogurt)  Home fasting blood sugars: 316, 156, 225, 184, 227, 215, 232 2 hour post-meal/random blood sugars: 136, 268, 89, 153, 128, 98, 245, 186  Lab  Results  Component Value Date   HGBA1C 9.2 (A) 09/29/2020   Vitals:   10/29/20 1607  BP: 136/81  Pulse: 66   Lipid Panel     Component Value Date/Time   CHOL 191 09/29/2020 1535   TRIG 131 09/29/2020 1535   HDL 49 09/29/2020 1535   CHOLHDL 3.9 09/29/2020 1535   CHOLHDL 4.4 09/24/2019 0901   LDLCALC 119 (H) 09/29/2020 1535   LDLCALC 110 (H) 09/24/2019 0901   Clinical Atherosclerotic Cardiovascular Disease (ASCVD): No  The 10-year ASCVD risk score Mikey Bussing DC Jr., et al., 2013) is: 26.2%   Values used to calculate the score:     Age: 55 years     Sex: Female     Is Non-Hispanic African American: Yes     Diabetic: Yes     Tobacco smoker: Yes     Systolic Blood Pressure: 536 mmHg     Is BP treated: Yes     HDL Cholesterol: 49 mg/dL     Total Cholesterol: 191 mg/dL    A/P: Diabetes longstanding currently uncontrolled. Patient is able to verbalize appropriate hypoglycemia management plan. Medication adherence appears optimal. Encouraged patient to aim for a diet full of vegetables, fruit and lean meats (chicken, [place], fish) and to limit carbs (bread, pasta, sugar, rice) and red meat consumption. Encouraged patient to exercise 20-30 minutes daily with the goal of 150 minutes per week. Patient verbalized understanding. Additionally, recommended OTC famotidine (Pepcid) as needed for acid reflux symptom relief.  -Increased Trulicity to 3 mg weekly -Continued Basaglar 80 units daily -Continued glipizide 5 mg BID -Sent refills for DM medications -Extensively discussed pathophysiology of diabetes, recommended lifestyle interventions, dietary effects on blood  sugar control -Counseled on s/sx of and management of hypoglycemia -Next A1C anticipated 12/2020.   ASCVD risk - primary prevention in patient with diabetes. Last LDL is controlled. ASCVD risk score is >20%  - high intensity statin indicated.  -Recommend Rosuvastatin 20 mg daily -Continue fenofibrate 160 mg daily  Hypertension  longstandin currently near goal since restarting HCTZ. Blood pressure goal <130/80 mmHg. Medication adherence optimal.   -No medication changes today -Continued lisinopril 40 mg daily -Continued HCTZ 25 mg daily -Sent refills for HTN medications  Written patient instructions provided.  Total time in face to face counseling 30 minutes.   Follow up Pharmacist Clinic Visit in 4 weeks.  Lorel Monaco, PharmD, [place] PGY2 Ambulatory Care Resident [place]

## 2020-10-29 ENCOUNTER — Other Ambulatory Visit: Payer: Self-pay

## 2020-10-29 ENCOUNTER — Ambulatory Visit: Payer: Self-pay | Attending: Nurse Practitioner | Admitting: Pharmacist

## 2020-10-29 ENCOUNTER — Encounter: Payer: Self-pay | Admitting: Pharmacist

## 2020-10-29 VITALS — BP 136/81 | HR 66

## 2020-10-29 DIAGNOSIS — Z794 Long term (current) use of insulin: Secondary | ICD-10-CM

## 2020-10-29 DIAGNOSIS — E1165 Type 2 diabetes mellitus with hyperglycemia: Secondary | ICD-10-CM

## 2020-10-29 LAB — GLUCOSE, POCT (MANUAL RESULT ENTRY): POC Glucose: 116 mg/dl — AB (ref 70–99)

## 2020-10-29 MED ORDER — TRULICITY 3 MG/0.5ML ~~LOC~~ SOAJ
3.0000 mg | SUBCUTANEOUS | 4 refills | Status: DC
  Filled 2020-10-29: qty 2, 28d supply, fill #0
  Filled 2020-12-01: qty 2, 28d supply, fill #1
  Filled 2021-01-07: qty 2, 28d supply, fill #2
  Filled 2021-01-19: qty 6, 84d supply, fill #2
  Filled 2021-02-18 – 2021-04-13 (×2): qty 6, 84d supply, fill #3
  Filled 2021-06-15: qty 6, 84d supply, fill #4
  Filled 2021-07-02: qty 4, 56d supply, fill #4
  Filled 2021-07-21: qty 2, 28d supply, fill #0
  Filled 2021-07-21: qty 6, 84d supply, fill #4
  Filled 2021-08-25: qty 2, 28d supply, fill #1

## 2020-10-29 MED FILL — Insulin Glargine Soln Pen-Injector 100 Unit/ML: SUBCUTANEOUS | 30 days supply | Qty: 24 | Fill #0 | Status: AC

## 2020-10-29 MED FILL — Glipizide Tab 5 MG: ORAL | 30 days supply | Qty: 60 | Fill #0 | Status: AC

## 2020-10-29 MED FILL — Hydrochlorothiazide Tab 25 MG: ORAL | 30 days supply | Qty: 30 | Fill #0 | Status: AC

## 2020-10-29 MED FILL — Methimazole Tab 5 MG: ORAL | 30 days supply | Qty: 60 | Fill #0 | Status: AC

## 2020-10-29 MED FILL — Fenofibrate Tab 160 MG: ORAL | 30 days supply | Qty: 30 | Fill #0 | Status: AC

## 2020-11-06 ENCOUNTER — Telehealth: Payer: Self-pay | Admitting: Nurse Practitioner

## 2020-11-06 ENCOUNTER — Other Ambulatory Visit: Payer: Self-pay | Admitting: Nurse Practitioner

## 2020-11-06 DIAGNOSIS — K219 Gastro-esophageal reflux disease without esophagitis: Secondary | ICD-10-CM

## 2020-11-06 MED ORDER — OMEPRAZOLE 40 MG PO CPDR
40.0000 mg | DELAYED_RELEASE_CAPSULE | Freq: Two times a day (BID) | ORAL | 0 refills | Status: DC
  Filled 2020-11-06 – 2020-11-21 (×2): qty 60, 30d supply, fill #0

## 2020-11-06 NOTE — Telephone Encounter (Signed)
Pt states the omeprazole (PRILOSEC) 40 MG capsule is NOT working.  Pt states she has had 4 days of severe acid reflux, nausea, and indigestion. Pt states she has woken up like this and needs something stronger than what she is taking now.  Pt states her sugars have come down.  But she is scared to eat anything because of this indigestion.  pt would like something called into   [facility] and [place]

## 2020-11-06 NOTE — Telephone Encounter (Signed)
Increase to twice a day. Also needs to make sure she has applied for insurance here at the clinic in case she has to be sent to GI if taking omeprazole twice a day does not work. Also needs to follow strict diet and not eating foods that cause heartburn or drinking liquids that cause heartburn

## 2020-11-07 ENCOUNTER — Other Ambulatory Visit: Payer: Self-pay

## 2020-11-07 NOTE — Telephone Encounter (Signed)
Lets try omeprazole twice a day for now to see if this will help. She also needs to avoid foods that can cause heartburn. This is very important. Also omeprazole should be taken first thing in the morning on an empty stomach before any foods or medications. It should not be taken with food or other medications.

## 2020-11-07 NOTE — Telephone Encounter (Signed)
Returned pt call and went over provider response. Pt states she understands. Pt is wanting to know if there is another acid reflux medication that she can take that will not interact with her other medications because she feel that the omeprazole is not helping

## 2020-11-10 ENCOUNTER — Other Ambulatory Visit: Payer: Self-pay

## 2020-11-10 NOTE — Telephone Encounter (Signed)
Contacted pt to go over provider response pt is aware... Pt is requesting an updated rx to the pharmacy. Lurena Joiner would you be able to send in a updated rx. Pt would like to pickup this evening

## 2020-11-10 NOTE — Telephone Encounter (Signed)
This rx was sent. I verified with the pharmacy that it is ready to dispense.

## 2020-11-14 ENCOUNTER — Other Ambulatory Visit: Payer: Self-pay

## 2020-11-21 ENCOUNTER — Other Ambulatory Visit: Payer: Self-pay

## 2020-11-26 ENCOUNTER — Other Ambulatory Visit: Payer: Self-pay

## 2020-11-26 ENCOUNTER — Ambulatory Visit: Payer: Self-pay | Admitting: Pharmacist

## 2020-11-27 ENCOUNTER — Telehealth: Payer: Self-pay

## 2020-11-27 NOTE — Telephone Encounter (Signed)
Copied from [place] [phone]. Topic: General - Other >> Nov 27, 2020  2:29 PM Celene Kras wrote: Reason for CRM: Gerhard Perches, from [place] pt assistance program, called to speak with Cheryle Horsfall regarding the pt using their pharmacy to have her medications sent to.   Please advise.

## 2020-12-01 ENCOUNTER — Other Ambulatory Visit: Payer: Self-pay

## 2020-12-01 ENCOUNTER — Encounter: Payer: Self-pay | Admitting: Hematology

## 2020-12-01 MED FILL — Insulin Glargine Soln Pen-Injector 100 Unit/ML: SUBCUTANEOUS | 30 days supply | Qty: 24 | Fill #1 | Status: CN

## 2020-12-01 MED FILL — Insulin Glargine Soln Pen-Injector 100 Unit/ML: SUBCUTANEOUS | 15 days supply | Qty: 12 | Fill #1 | Status: AC

## 2020-12-01 MED FILL — Hydrochlorothiazide Tab 25 MG: ORAL | 30 days supply | Qty: 30 | Fill #1 | Status: AC

## 2020-12-01 MED FILL — Methimazole Tab 5 MG: ORAL | 30 days supply | Qty: 60 | Fill #1 | Status: AC

## 2020-12-01 MED FILL — Glipizide Tab 5 MG: ORAL | 30 days supply | Qty: 60 | Fill #1 | Status: AC

## 2020-12-01 MED FILL — Fenofibrate Tab 160 MG: ORAL | 30 days supply | Qty: 30 | Fill #1 | Status: AC

## 2020-12-02 ENCOUNTER — Other Ambulatory Visit: Payer: Self-pay

## 2020-12-05 ENCOUNTER — Other Ambulatory Visit: Payer: Self-pay

## 2020-12-11 ENCOUNTER — Other Ambulatory Visit: Payer: Self-pay

## 2020-12-25 ENCOUNTER — Other Ambulatory Visit: Payer: Self-pay | Admitting: Family Medicine

## 2021-01-05 ENCOUNTER — Telehealth: Payer: Self-pay | Admitting: Nurse Practitioner

## 2021-01-05 ENCOUNTER — Other Ambulatory Visit: Payer: Self-pay

## 2021-01-05 ENCOUNTER — Ambulatory Visit: Payer: Self-pay | Attending: Nurse Practitioner | Admitting: Nurse Practitioner

## 2021-01-05 NOTE — Telephone Encounter (Signed)
No answer. LVM ?

## 2021-01-05 NOTE — Telephone Encounter (Signed)
No answer. LVM to return call to office

## 2021-01-07 ENCOUNTER — Other Ambulatory Visit: Payer: Self-pay | Admitting: Nurse Practitioner

## 2021-01-07 MED FILL — Insulin Glargine Soln Pen-Injector 100 Unit/ML: SUBCUTANEOUS | 15 days supply | Qty: 12 | Fill #2 | Status: CN

## 2021-01-07 MED FILL — Fenofibrate Tab 160 MG: ORAL | 30 days supply | Qty: 30 | Fill #2 | Status: CN

## 2021-01-07 MED FILL — Hydrochlorothiazide Tab 25 MG: ORAL | 30 days supply | Qty: 30 | Fill #2 | Status: CN

## 2021-01-07 MED FILL — Methimazole Tab 5 MG: ORAL | 30 days supply | Qty: 60 | Fill #2 | Status: CN

## 2021-01-08 ENCOUNTER — Other Ambulatory Visit: Payer: Self-pay

## 2021-01-08 ENCOUNTER — Encounter: Payer: Self-pay | Admitting: Hematology

## 2021-01-08 MED ORDER — GLIPIZIDE 5 MG PO TABS
5.0000 mg | ORAL_TABLET | Freq: Two times a day (BID) | ORAL | 0 refills | Status: DC
  Filled 2021-01-08 – 2021-01-19 (×2): qty 60, 30d supply, fill #0

## 2021-01-08 NOTE — Telephone Encounter (Signed)
    Notes to clinic: verify directions for refill   Requested Prescriptions  Pending Prescriptions Disp Refills   glipiZIDE (GLUCOTROL) 5 MG tablet 180 tablet 0      Endocrinology:  Diabetes - Sulfonylureas Failed - 01/07/2021  4:50 PM      Failed - HBA1C is between 0 and 7.9 and within 180 days    HbA1c, POC (controlled diabetic range)  Date Value Ref Range Status  09/29/2020 9.2 (A) 0.0 - 7.0 % Final          Passed - Valid encounter within last 6 months    Recent Outpatient Visits           2 months ago Type 2 diabetes mellitus with hyperglycemia, with long-term current use of insulin [facility])   [place], Annie Main L, RPH-CPP   3 months ago Type 2 diabetes mellitus with hyperglycemia, with long-term current use of insulin [facility])   [place], Vernia Buff, NP

## 2021-01-14 ENCOUNTER — Other Ambulatory Visit: Payer: Self-pay

## 2021-01-15 ENCOUNTER — Other Ambulatory Visit: Payer: Self-pay

## 2021-01-19 ENCOUNTER — Encounter: Payer: Self-pay | Admitting: Hematology

## 2021-01-19 ENCOUNTER — Other Ambulatory Visit: Payer: Self-pay

## 2021-01-19 MED FILL — Hydrochlorothiazide Tab 25 MG: ORAL | 30 days supply | Qty: 30 | Fill #2 | Status: AC

## 2021-01-19 MED FILL — Fenofibrate Tab 160 MG: ORAL | 30 days supply | Qty: 30 | Fill #2 | Status: AC

## 2021-01-19 MED FILL — Methimazole Tab 5 MG: ORAL | 30 days supply | Qty: 60 | Fill #2 | Status: AC

## 2021-01-19 MED FILL — Insulin Glargine Soln Pen-Injector 100 Unit/ML: SUBCUTANEOUS | 34 days supply | Qty: 27 | Fill #2 | Status: AC

## 2021-01-20 ENCOUNTER — Other Ambulatory Visit: Payer: Self-pay

## 2021-02-03 ENCOUNTER — Encounter: Payer: Self-pay | Admitting: Hematology

## 2021-02-03 ENCOUNTER — Other Ambulatory Visit: Payer: Self-pay

## 2021-02-03 MED FILL — Gabapentin Cap 300 MG: ORAL | 30 days supply | Qty: 30 | Fill #0 | Status: AC

## 2021-02-06 ENCOUNTER — Other Ambulatory Visit: Payer: Self-pay

## 2021-02-18 ENCOUNTER — Encounter: Payer: Self-pay | Admitting: Hematology

## 2021-02-18 ENCOUNTER — Other Ambulatory Visit: Payer: Self-pay | Admitting: Nurse Practitioner

## 2021-02-18 ENCOUNTER — Other Ambulatory Visit: Payer: Self-pay | Admitting: Family Medicine

## 2021-02-18 ENCOUNTER — Other Ambulatory Visit: Payer: Self-pay

## 2021-02-18 MED ORDER — GLIPIZIDE 5 MG PO TABS
5.0000 mg | ORAL_TABLET | Freq: Two times a day (BID) | ORAL | 0 refills | Status: DC
  Filled 2021-02-18: qty 60, 30d supply, fill #0

## 2021-02-18 MED ORDER — BASAGLAR KWIKPEN 100 UNIT/ML ~~LOC~~ SOPN
80.0000 [IU] | PEN_INJECTOR | Freq: Every day | SUBCUTANEOUS | 0 refills | Status: DC
  Filled 2021-02-18: qty 30, 37d supply, fill #0
  Filled 2021-03-27 – 2021-04-03 (×2): qty 30, 37d supply, fill #1
  Filled 2021-05-05 – 2021-05-15 (×2): qty 15, 18d supply, fill #2

## 2021-02-18 MED FILL — Insulin Glargine Soln Pen-Injector 100 Unit/ML: SUBCUTANEOUS | 34 days supply | Qty: 27 | Fill #3 | Status: CN

## 2021-02-18 MED FILL — Hydrochlorothiazide Tab 25 MG: ORAL | 30 days supply | Qty: 30 | Fill #3 | Status: AC

## 2021-02-18 MED FILL — Fenofibrate Tab 160 MG: ORAL | 30 days supply | Qty: 30 | Fill #3 | Status: AC

## 2021-02-18 NOTE — Telephone Encounter (Signed)
Requested medications are due for refill today.  yes  Requested medications are on the active medications list.  yes  Last refill. 09/29/2020  Future visit scheduled.   no  Notes to clinic.  Medication not delegated.

## 2021-02-18 NOTE — Telephone Encounter (Signed)
Requested Prescriptions  Pending Prescriptions Disp Refills  . glipiZIDE (GLUCOTROL) 5 MG tablet 60 tablet 0    Sig: Take 1 tablet (5 mg total) by mouth 2 (two) times daily before a meal.     Endocrinology:  Diabetes - Sulfonylureas Failed - 02/18/2021  3:44 PM      Failed - HBA1C is between 0 and 7.9 and within 180 days    HbA1c, POC (controlled diabetic range)  Date Value Ref Range Status  09/29/2020 9.2 (A) 0.0 - 7.0 % Final         Passed - Valid encounter within last 6 months    Recent Outpatient Visits          3 months ago Type 2 diabetes mellitus with hyperglycemia, with long-term current use of insulin [facility])   [place], Annie Main L, RPH-CPP   4 months ago Type 2 diabetes mellitus with hyperglycemia, with long-term current use of insulin [facility])   [place], Vernia Buff, NP

## 2021-02-18 NOTE — Telephone Encounter (Signed)
Requested Prescriptions  Pending Prescriptions Disp Refills  . Insulin Glargine (BASAGLAR KWIKPEN) 100 UNIT/ML 75 mL 0    Sig: Inject 80 units subcutaneous daily.     Endocrinology:  Diabetes - Insulins Failed - 02/18/2021  3:53 PM      Failed - HBA1C is between 0 and 7.9 and within 180 days    HbA1c, POC (controlled diabetic range)  Date Value Ref Range Status  09/29/2020 9.2 (A) 0.0 - 7.0 % Final         Passed - Valid encounter within last 6 months    Recent Outpatient Visits          3 months ago Type 2 diabetes mellitus with hyperglycemia, with long-term current use of insulin [facility])   [place], Annie Main L, RPH-CPP   4 months ago Type 2 diabetes mellitus with hyperglycemia, with long-term current use of insulin [facility])   [place], Vernia Buff, NP

## 2021-02-19 ENCOUNTER — Encounter: Payer: Self-pay | Admitting: Hematology

## 2021-02-19 ENCOUNTER — Other Ambulatory Visit: Payer: Self-pay

## 2021-02-19 MED ORDER — METHIMAZOLE 5 MG PO TABS
5.0000 mg | ORAL_TABLET | Freq: Two times a day (BID) | ORAL | 0 refills | Status: DC
  Filled 2021-02-19: qty 60, 30d supply, fill #0

## 2021-02-23 ENCOUNTER — Other Ambulatory Visit: Payer: Self-pay

## 2021-03-11 ENCOUNTER — Other Ambulatory Visit: Payer: Self-pay

## 2021-03-12 MED FILL — Gabapentin Cap 300 MG: ORAL | 30 days supply | Qty: 30 | Fill #1 | Status: CN

## 2021-03-13 ENCOUNTER — Other Ambulatory Visit: Payer: Self-pay

## 2021-03-18 ENCOUNTER — Other Ambulatory Visit: Payer: Self-pay

## 2021-03-20 ENCOUNTER — Other Ambulatory Visit: Payer: Self-pay

## 2021-03-27 ENCOUNTER — Other Ambulatory Visit: Payer: Self-pay

## 2021-03-27 ENCOUNTER — Encounter: Payer: Self-pay | Admitting: Hematology

## 2021-03-27 ENCOUNTER — Other Ambulatory Visit: Payer: Self-pay | Admitting: Nurse Practitioner

## 2021-03-27 ENCOUNTER — Telehealth: Payer: Self-pay | Admitting: Family Medicine

## 2021-03-27 DIAGNOSIS — E0590 Thyrotoxicosis, unspecified without thyrotoxic crisis or storm: Secondary | ICD-10-CM

## 2021-03-27 MED ORDER — GLIPIZIDE 5 MG PO TABS
5.0000 mg | ORAL_TABLET | Freq: Two times a day (BID) | ORAL | 0 refills | Status: DC
  Filled 2021-03-27 – 2021-04-03 (×2): qty 60, 30d supply, fill #0

## 2021-03-27 MED FILL — Fenofibrate Tab 160 MG: ORAL | 30 days supply | Qty: 30 | Fill #4 | Status: CN

## 2021-03-27 MED FILL — Hydrochlorothiazide Tab 25 MG: ORAL | 30 days supply | Qty: 30 | Fill #4 | Status: CN

## 2021-03-27 NOTE — Telephone Encounter (Signed)
Requested medication (s) are due for refill today: yes  Requested medication (s) are on the active medication list: yes  Last refill:  02/19/21 #60   Future visit scheduled: no  Notes to clinic:  Please review for refill. Current prescription expired. Unable to refill per protocol    Requested Prescriptions  Pending Prescriptions Disp Refills   methimazole (TAPAZOLE) 5 MG tablet 60 tablet 0    Sig: Take 1 tablet (5 mg total) by mouth 2 (two) times daily.     Not Delegated - Endocrinology:  Hyperthyroid Agents Failed - 03/27/2021 12:45 PM      Failed - This refill cannot be delegated      Passed - TSH in normal range and within 180 days    TSH  Date Value Ref Range Status  09/29/2020 1.120 0.450 - 4.500 uIU/mL Final          Passed - Valid encounter within last 6 months    Recent Outpatient Visits           4 months ago Type 2 diabetes mellitus with hyperglycemia, with long-term current use of insulin [facility])   [place], Annie Main L, RPH-CPP   5 months ago Type 2 diabetes mellitus with hyperglycemia, with long-term current use of insulin [facility])   [place], Vernia Buff, NP

## 2021-04-03 ENCOUNTER — Other Ambulatory Visit: Payer: Self-pay

## 2021-04-03 ENCOUNTER — Encounter: Payer: Self-pay | Admitting: Hematology

## 2021-04-03 MED FILL — Gabapentin Cap 300 MG: ORAL | 30 days supply | Qty: 30 | Fill #1 | Status: AC

## 2021-04-03 MED FILL — Fenofibrate Tab 160 MG: ORAL | 30 days supply | Qty: 30 | Fill #4 | Status: AC

## 2021-04-03 MED FILL — Hydrochlorothiazide Tab 25 MG: ORAL | 30 days supply | Qty: 30 | Fill #4 | Status: AC

## 2021-04-06 NOTE — Telephone Encounter (Signed)
Pt called in to schedule next available with PCP on 05/18/21. She states that she is completely out of medication and is needing to have this sent to pharmacy today. Please advise.

## 2021-04-07 ENCOUNTER — Other Ambulatory Visit: Payer: Self-pay

## 2021-04-07 ENCOUNTER — Encounter: Payer: Self-pay | Admitting: Hematology

## 2021-04-07 MED ORDER — METHIMAZOLE 5 MG PO TABS
5.0000 mg | ORAL_TABLET | Freq: Two times a day (BID) | ORAL | 0 refills | Status: DC
  Filled 2021-04-07: qty 60, 30d supply, fill #0

## 2021-04-07 NOTE — Telephone Encounter (Signed)
Pt called to check status of refill request and she would like to get this today since she has been out/ she asked if someone can call her today when she can pick it up/ please send to [facility] and [place]

## 2021-04-07 NOTE — Addendum Note (Signed)
Addended by: Carilyn Goodpasture on: 04/07/2021 10:21 AM   Modules accepted: Orders

## 2021-04-08 ENCOUNTER — Other Ambulatory Visit: Payer: Self-pay

## 2021-04-10 ENCOUNTER — Other Ambulatory Visit: Payer: Self-pay | Admitting: Nurse Practitioner

## 2021-04-10 ENCOUNTER — Telehealth: Payer: Self-pay | Admitting: Nurse Practitioner

## 2021-04-10 ENCOUNTER — Other Ambulatory Visit: Payer: Self-pay

## 2021-04-10 DIAGNOSIS — E0590 Thyrotoxicosis, unspecified without thyrotoxic crisis or storm: Secondary | ICD-10-CM

## 2021-04-10 MED ORDER — METHIMAZOLE 5 MG PO TABS
5.0000 mg | ORAL_TABLET | Freq: Two times a day (BID) | ORAL | 0 refills | Status: DC
Start: 2021-04-10 — End: 2021-06-15
  Filled 2021-04-10 – 2021-05-15 (×3): qty 60, 30d supply, fill #0

## 2021-04-10 NOTE — Telephone Encounter (Signed)
Copied from [place] [id number]. Topic: General - Other >> Apr 09, 2021 10:45 AM Tessa Lerner A wrote: Reason for CRM: The patient would like ot know if it is possible to receive a modified prescription of their methimazole (TAPAZOLE) 5 MG tablet until their appt on 05/18/21  The patient has stressed their desire to continue taking the medication regularly and would like to know if it is possible to receive a modified prescription  Please contact further when possible, patient shares that they have made previous attempts to contact staff

## 2021-04-10 NOTE — Telephone Encounter (Signed)
REFILL HAS BEEN SENT

## 2021-04-13 ENCOUNTER — Other Ambulatory Visit: Payer: Self-pay

## 2021-04-13 NOTE — Telephone Encounter (Signed)
Kristina Adams has sent in that refill request.

## 2021-04-14 ENCOUNTER — Other Ambulatory Visit: Payer: Self-pay

## 2021-04-14 ENCOUNTER — Encounter: Payer: Self-pay | Admitting: Hematology

## 2021-04-20 ENCOUNTER — Other Ambulatory Visit: Payer: Self-pay

## 2021-05-05 ENCOUNTER — Other Ambulatory Visit: Payer: Self-pay | Admitting: Nurse Practitioner

## 2021-05-05 MED ORDER — HYDROCHLOROTHIAZIDE 25 MG PO TABS
25.0000 mg | ORAL_TABLET | Freq: Every day | ORAL | 0 refills | Status: DC
  Filled 2021-05-05 – 2021-05-15 (×2): qty 30, 30d supply, fill #0
  Filled 2021-06-15: qty 30, 30d supply, fill #1
  Filled 2021-07-21: qty 30, 30d supply, fill #2
  Filled 2021-07-21: qty 30, 30d supply, fill #0

## 2021-05-05 MED ORDER — FENOFIBRATE 160 MG PO TABS
ORAL_TABLET | Freq: Every day | ORAL | 0 refills | Status: DC
  Filled 2021-05-05 – 2021-05-15 (×2): qty 30, 30d supply, fill #0
  Filled 2021-06-15: qty 30, 30d supply, fill #1
  Filled 2021-07-21: qty 30, 30d supply, fill #0
  Filled 2021-07-21: qty 30, 30d supply, fill #2

## 2021-05-05 NOTE — Telephone Encounter (Signed)
Requested Prescriptions  Pending Prescriptions Disp Refills  . hydrochlorothiazide (HYDRODIURIL) 25 MG tablet 90 tablet 1    Sig: TAKE 1 TABLET (25 MG TOTAL) BY MOUTH DAILY.     Cardiovascular: Diuretics - Thiazide Passed - 05/05/2021  6:41 PM      Passed - Ca in normal range and within 360 days    Calcium  Date Value Ref Range Status  09/29/2020 9.8 8.7 - 10.2 mg/dL Final  07/08/2014 8.8 8.4 - 10.4 mg/dL Final         Passed - Cr in normal range and within 360 days    Creatinine  Date Value Ref Range Status  07/08/2014 0.8 0.6 - 1.1 mg/dL Final   Creat  Date Value Ref Range Status  09/24/2019 0.81 0.50 - 1.05 mg/dL Final    Comment:    For patients >45 years of age, the reference limit for Creatinine is approximately 13% higher for people identified as African-American. .    Creatinine, Ser  Date Value Ref Range Status  09/29/2020 0.71 0.57 - 1.00 mg/dL Final         Passed - K in normal range and within 360 days    Potassium  Date Value Ref Range Status  09/29/2020 4.5 3.5 - 5.2 mmol/L Final  07/08/2014 4.6 3.5 - 5.1 mEq/L Final         Passed - Na in normal range and within 360 days    Sodium  Date Value Ref Range Status  09/29/2020 140 134 - 144 mmol/L Final  07/08/2014 138 136 - 145 mEq/L Final         Passed - Last BP in normal range    BP Readings from Last 1 Encounters:  10/29/20 136/81         Passed - Valid encounter within last 6 months    Recent Outpatient Visits          6 months ago Type 2 diabetes mellitus with hyperglycemia, with long-term current use of insulin [facility])   [place], Jarome Matin, RPH-CPP   7 months ago Type 2 diabetes mellitus with hyperglycemia, with long-term current use of insulin [facility])   [place], Vernia Buff, NP      Future Appointments            In 1 week Gildardo Pounds, NP [place]           .  fenofibrate 160 MG tablet 90 tablet 1    Sig: TAKE 1 TABLET (160 MG TOTAL) BY MOUTH DAILY.     Cardiovascular:  Antilipid - Fibric Acid Derivatives Failed - 05/05/2021  6:41 PM      Failed - LDL in normal range and within 360 days    LDL Cholesterol (Calc)  Date Value Ref Range Status  09/24/2019 110 (H) mg/dL (calc) Final    Comment:    Reference range: <100 . Desirable range <100 mg/dL for primary prevention;   <70 mg/dL for patients with CHD or diabetic patients  with > or = 2 CHD risk factors. [place] Kitchen LDL-C is now calculated using the Martin-Hopkins  calculation, which is a validated novel method providing  better accuracy than the Friedewald equation in the  estimation of LDL-C.  Cresenciano Genre et al. Annamaria Helling. 6812;751(70): 2061-2068  (http://education.[employer].com/faq/FAQ164)    LDL Chol Calc (NIH)  Date Value Ref Range Status  09/29/2020 119 (H) 0 -  99 mg/dL Final         Failed - ALT in normal range and within 180 days    ALT  Date Value Ref Range Status  09/29/2020 10 0 - 32 IU/L Final  07/08/2014 13 0 - 55 U/L Final         Failed - AST in normal range and within 180 days    AST  Date Value Ref Range Status  09/29/2020 8 0 - 40 IU/L Final  07/08/2014 10 5 - 34 U/L Final         Failed - Cr in normal range and within 180 days    Creatinine  Date Value Ref Range Status  07/08/2014 0.8 0.6 - 1.1 mg/dL Final   Creat  Date Value Ref Range Status  09/24/2019 0.81 0.50 - 1.05 mg/dL Final    Comment:    For patients >58 years of age, the reference limit for Creatinine is approximately 13% higher for people identified as African-American. .    Creatinine, Ser  Date Value Ref Range Status  09/29/2020 0.71 0.57 - 1.00 mg/dL Final         Failed - AA eGFR in normal range and within 180 days    GFR calc Af Amer  Date Value Ref Range Status  08/17/2012 >90 >90 mL/min Final    Comment:           The eGFR has been calculated using the CKD EPI equation. This  calculation has not been validated in all clinical situations. eGFR's persistently <90 mL/min signify possible Chronic Kidney Disease.   GFR calc non Af Amer  Date Value Ref Range Status  08/17/2012 >90 >90 mL/min Final   eGFR  Date Value Ref Range Status  09/29/2020 101 >59 mL/min/1.73 Final         Passed - Total Cholesterol in normal range and within 360 days    Cholesterol, Total  Date Value Ref Range Status  09/29/2020 191 100 - 199 mg/dL Final         Passed - HDL in normal range and within 360 days    HDL  Date Value Ref Range Status  09/29/2020 49 >39 mg/dL Final         Passed - Triglycerides in normal range and within 360 days    Triglycerides  Date Value Ref Range Status  09/29/2020 131 0 - 149 mg/dL Final         Passed - Valid encounter within last 12 months    Recent Outpatient Visits          6 months ago Type 2 diabetes mellitus with hyperglycemia, with long-term current use of insulin [facility])   [place], Annie Main L, RPH-CPP   7 months ago Type 2 diabetes mellitus with hyperglycemia, with long-term current use of insulin [facility])   [place], Vernia Buff, NP      Future Appointments            In 1 week Gildardo Pounds, NP [place]           . glipiZIDE (GLUCOTROL) 5 MG tablet 60 tablet 0    Sig: Take 1 tablet (5 mg total) by mouth 2 (two) times daily before a meal.     Endocrinology:  Diabetes - Sulfonylureas Failed - 05/05/2021  6:41 PM      Failed - HBA1C is between 0  and 7.9 and within 180 days    HbA1c, POC (controlled diabetic range)  Date Value Ref Range Status  09/29/2020 9.2 (A) 0.0 - 7.0 % Final         Passed - Valid encounter within last 6 months    Recent Outpatient Visits          6 months ago Type 2 diabetes mellitus with hyperglycemia, with long-term current use of insulin [facility])   [place], Jarome Matin, RPH-CPP   7 months ago Type 2 diabetes mellitus with hyperglycemia, with long-term current use of insulin [facility])   [place], Vernia Buff, NP      Future Appointments            In 1 week Gildardo Pounds, NP [place]

## 2021-05-05 NOTE — Telephone Encounter (Signed)
Requested medications are due for refill today.  yes  Requested medications are on the active medications list.  yes  Last refill. 03/27/2021  Future visit scheduled.   yes  Notes to clinic.  Rx expired on 05/03/2021

## 2021-05-06 ENCOUNTER — Other Ambulatory Visit: Payer: Self-pay

## 2021-05-06 ENCOUNTER — Encounter: Payer: Self-pay | Admitting: Hematology

## 2021-05-06 MED FILL — Cyclobenzaprine HCl Tab 10 MG: ORAL | 10 days supply | Qty: 30 | Fill #0 | Status: CN

## 2021-05-07 ENCOUNTER — Encounter: Payer: Self-pay | Admitting: Hematology

## 2021-05-07 ENCOUNTER — Other Ambulatory Visit: Payer: Self-pay

## 2021-05-07 MED ORDER — GLIPIZIDE 5 MG PO TABS
5.0000 mg | ORAL_TABLET | Freq: Two times a day (BID) | ORAL | 0 refills | Status: DC
  Filled 2021-05-07 – 2021-05-15 (×2): qty 60, 30d supply, fill #0

## 2021-05-13 ENCOUNTER — Other Ambulatory Visit: Payer: Self-pay

## 2021-05-14 ENCOUNTER — Other Ambulatory Visit: Payer: Self-pay

## 2021-05-15 ENCOUNTER — Other Ambulatory Visit: Payer: Self-pay

## 2021-05-15 ENCOUNTER — Encounter: Payer: Self-pay | Admitting: Hematology

## 2021-05-15 MED FILL — Cyclobenzaprine HCl Tab 10 MG: ORAL | 10 days supply | Qty: 30 | Fill #0 | Status: AC

## 2021-05-18 ENCOUNTER — Encounter: Payer: Self-pay | Admitting: Hematology

## 2021-05-18 ENCOUNTER — Ambulatory Visit: Payer: Self-pay | Attending: Nurse Practitioner | Admitting: Nurse Practitioner

## 2021-05-18 ENCOUNTER — Other Ambulatory Visit: Payer: Self-pay

## 2021-05-18 ENCOUNTER — Encounter: Payer: Self-pay | Admitting: Nurse Practitioner

## 2021-05-18 VITALS — BP 167/72 | HR 59 | Ht 70.0 in | Wt 313.1 lb

## 2021-05-18 DIAGNOSIS — Z1211 Encounter for screening for malignant neoplasm of colon: Secondary | ICD-10-CM

## 2021-05-18 DIAGNOSIS — Z1231 Encounter for screening mammogram for malignant neoplasm of breast: Secondary | ICD-10-CM

## 2021-05-18 DIAGNOSIS — D649 Anemia, unspecified: Secondary | ICD-10-CM

## 2021-05-18 DIAGNOSIS — Z794 Long term (current) use of insulin: Secondary | ICD-10-CM

## 2021-05-18 DIAGNOSIS — K219 Gastro-esophageal reflux disease without esophagitis: Secondary | ICD-10-CM

## 2021-05-18 DIAGNOSIS — E1165 Type 2 diabetes mellitus with hyperglycemia: Secondary | ICD-10-CM

## 2021-05-18 DIAGNOSIS — I10 Essential (primary) hypertension: Secondary | ICD-10-CM

## 2021-05-18 LAB — POCT GLYCOSYLATED HEMOGLOBIN (HGB A1C): Hemoglobin A1C: 7.9 % — AB (ref 4.0–5.6)

## 2021-05-18 LAB — GLUCOSE, POCT (MANUAL RESULT ENTRY): POC Glucose: 94 mg/dl (ref 70–99)

## 2021-05-18 MED ORDER — BASAGLAR KWIKPEN 100 UNIT/ML ~~LOC~~ SOPN
80.0000 [IU] | PEN_INJECTOR | Freq: Every day | SUBCUTANEOUS | 3 refills | Status: DC
  Filled 2021-05-18: qty 24, 30d supply, fill #0
  Filled 2021-05-18: qty 72, 90d supply, fill #0
  Filled 2021-06-15: qty 24, 30d supply, fill #1
  Filled 2021-07-21: qty 24, 30d supply, fill #0
  Filled 2021-08-25: qty 24, 30d supply, fill #1
  Filled 2021-09-28: qty 24, 30d supply, fill #2
  Filled 2021-10-30 – 2021-11-10 (×2): qty 24, 30d supply, fill #3

## 2021-05-18 MED ORDER — PANTOPRAZOLE SODIUM 40 MG PO TBEC
40.0000 mg | DELAYED_RELEASE_TABLET | Freq: Two times a day (BID) | ORAL | 1 refills | Status: DC
  Filled 2021-05-18: qty 60, 30d supply, fill #0
  Filled 2021-06-15: qty 60, 30d supply, fill #1
  Filled 2021-08-25: qty 60, 30d supply, fill #0
  Filled 2021-09-28: qty 60, 30d supply, fill #1
  Filled 2021-10-30 – 2021-11-10 (×2): qty 60, 30d supply, fill #2
  Filled 2021-12-10: qty 60, 30d supply, fill #3

## 2021-05-18 NOTE — Progress Notes (Signed)
Right side Back x several months  Stomach pain

## 2021-05-18 NOTE — Progress Notes (Signed)
Assessment & Plan:  Kristina Adams was seen today for diabetes.  Diagnoses and all orders for this visit:  Type 2 diabetes mellitus with hyperglycemia, with long-term current use of insulin (HCC) -     POCT glucose (manual entry) -     POCT glycosylated hemoglobin (Hb A1C) -     Insulin Glargine (BASAGLAR KWIKPEN) 100 UNIT/ML; Inject 80 units subcutaneous daily. -     CMP14+EGFR  Essential hypertension  Gastroesophageal reflux disease, unspecified whether esophagitis present -     pantoprazole (PROTONIX) 40 MG tablet; Take 1 tablet (40 mg total) by mouth 2 (two) times daily.  Anemia, unspecified type -     CBC  Colon cancer screening -     Ambulatory referral to Gastroenterology  Breast cancer screening by mammogram -     MM 3D SCREEN BREAST BILATERAL; Future   Patient has been counseled on age-appropriate routine health concerns for screening and prevention. These are reviewed and up-to-date. Referrals have been placed accordingly. Immunizations are up-to-date or declined.    Subjective:   Chief Complaint  Patient presents with   Diabetes   HPI Kristina Adams 55 y.o. female presents to office today for follow up to DM. She has a past medical history of Anemia (12/09/2011), Appendicitis, acute (12/03/2011), Chronic back pain (12/03/2011), Degenerative disc disease, cervical, Diabetes mellitus type 2, insulin dependent  (12/03/2011), GERD , H/O hiatal hernia, Hypertension (12/03/2011), Morbid obesity with BMI of 45.0-49.9, adult (12/03/2011), Sleep apnea (12/03/2011), and Thyroid disease.   She has complaints today of right sided back pain and abdominal pain.    Hypertension Blood pressure is poorly controlled. She has not been on HCTZ 25 mg daily in quite some time. Will resume today and she will continue on HCTZ 25 mg daily.  BP Readings from Last 3 Encounters:  05/18/21 (!) 167/72  10/29/20 136/81  09/29/20 (!) 153/81    DM 2 Diabetes is not well controlled but improving.  She  is monitoring her blood glucose at least 3 times per day. Average readings: 90-170. Currently administering Basaglar 80 units daily, glipizide 5 mg BID and Trulicity 1.5 mg weekly which will be increased to 3 mg weekly today.  Lab Results  Component Value Date   HGBA1C 7.9 (A) 05/18/2021    Lab Results  Component Value Date   HGBA1C 9.2 (A) 09/29/2020  LDL not at goal with fenofibrate 160 mg daily  Lab Results  Component Value Date   LDLCALC 119 (H) 09/29/2020    Review of Systems  Constitutional:  Negative for fever, malaise/fatigue and weight loss.  HENT: Negative.  Negative for nosebleeds.   Eyes: Negative.  Negative for blurred vision, double vision and photophobia.  Respiratory: Negative.  Negative for cough and shortness of breath.   Cardiovascular: Negative.  Negative for chest pain, palpitations and leg swelling.  Gastrointestinal:  Positive for heartburn. Negative for nausea and vomiting.  Musculoskeletal: Negative.  Negative for myalgias.  Neurological: Negative.  Negative for dizziness, focal weakness, seizures and headaches.  Psychiatric/Behavioral: Negative.  Negative for suicidal ideas.    Past Medical History:  Diagnosis Date   Anemia 12/09/2011   child birth   Appendicitis, acute 12/03/2011   Chronic back pain 12/03/2011   Degenerative disc disease, cervical    low back area   Diabetes mellitus type 2, insulin dependent ([place]) 12/03/2011   GERD (gastroesophageal reflux disease)    H/O hiatal hernia    Hypertension 12/03/2011   Morbid obesity with BMI  of 45.0-49.9, adult ([place]) 12/03/2011   Sleep apnea 12/03/2011   last sleep study May 2013   Thyroid disease     Past Surgical History:  Procedure Laterality Date   CESAREAN SECTION     LAPAROSCOPIC APPENDECTOMY  12/03/2011   Procedure: APPENDECTOMY LAPAROSCOPIC;  Surgeon: [name], DO;  Location: [facility];  Service: General;  Laterality: N/A;  drainage of intra-abdominal abscess    PARS PLANA VITRECTOMY Left  08/17/2012   Procedure: PARS PLANA VITRECTOMY WITH 25 GAUGE;  Surgeon: [name], MD;  Location: [place];  Service: Ophthalmology;  Laterality: Left;   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   Outpatient Medications Prior to Visit  Medication Sig Dispense Refill   cyclobenzaprine (FLEXERIL) 10 MG tablet Take 1 tablet (10 mg total) by mouth 3 (three) times daily as needed for muscle spasms. 30 tablet 1   diclofenac Sodium (VOLTAREN) 1 % GEL APPLY 4 G TOPICALLY 4 (FOUR) TIMES DAILY. 350 g 6   Dulaglutide (TRULICITY) 3 KX/3.8HW SOPN Inject 3 mg as directed once a week. 4 mL 4   fenofibrate 160 MG tablet TAKE 1 TABLET (160 MG TOTAL) BY MOUTH DAILY. 90 tablet 0   gabapentin (NEURONTIN) 300 MG capsule TAKE 1 CAPSULE (300 MG TOTAL) BY MOUTH AT BEDTIME. 90 capsule 1   glipiZIDE (GLUCOTROL) 5 MG tablet Take 1 tablet (5 mg total) by mouth 2 (two) times daily before a meal. 60 tablet 0   hydrochlorothiazide (HYDRODIURIL) 25 MG tablet TAKE 1 TABLET (25 MG TOTAL) BY MOUTH DAILY. 90 tablet 0   Insulin Pen Needle (B-D UF III MINI PEN NEEDLES) 31G X 5 MM MISC Use as instructed. Inject into the skin twice daily. NEEDS PASS 100 each 6   Insulin Pen Needle 31G X 5 MM MISC USE AS INSTRUCTED. INJECT INTO THE SKIN TWICE DAILY 100 each 6   lisinopril (ZESTRIL) 40 MG tablet Take 1 tablet (40 mg total) by mouth daily. NEEDS PASS 90 tablet 1   methimazole (TAPAZOLE) 5 MG  tablet Take 1 tablet (5 mg total) by mouth 2 (two) times daily. 60 tablet 0   traZODone (DESYREL) 100 MG tablet TAKE 0.5-1 TABLETS (50-100 MG TOTAL) BY MOUTH AT BEDTIME. 90 tablet 0   vitamin C (ASCORBIC ACID) 500 MG tablet Take 1,000 mg by mouth daily.     cyclobenzaprine (FLEXERIL) 10 MG tablet TAKE 1 TABLET (10 MG TOTAL) BY MOUTH 3 (THREE) TIMES DAILY AS NEEDED FOR MUSCLE SPASMS. 30 tablet 1   diclofenac Sodium (VOLTAREN) 1 % GEL Apply 4 g topically 4 (four) times daily. 350 g 6   fenofibrate 160 MG tablet Take 1 tablet (160 mg total) by mouth daily. NEEDS PASS 90 tablet 1   gabapentin (NEURONTIN) 300 MG capsule Take  1 capsule by mouth at bedtime 30 capsule 0   hydrochlorothiazide (HYDRODIURIL) 25 MG tablet Take 1 tablet (25 mg total) by mouth daily. NEEDS PASS 90 tablet 1   Insulin Glargine (BASAGLAR KWIKPEN) 100 UNIT/ML Inject 80 units subcutaneous daily. 75 mL 0   metroNIDAZOLE (FLAGYL) 500 MG tablet TAKE 1 TABLET (500 MG TOTAL) BY MOUTH 2 (TWO) TIMES DAILY FOR 7 DAYS. 14 tablet 0   Cholecalciferol (VITAMIN D-3) 125 MCG (5000 UT) TABS Take 1 tablet by mouth daily. (Patient not taking: Reported on 05/18/2021) 90 tablet 3   fluticasone (FLONASE) 50 MCG/ACT nasal spray Place 2 sprays into both nostrils daily. fluticasone propionate 50 mcg/actuation nasal spray,suspension (Patient not taking: Reported on 05/18/2021) 18 g 1   fluticasone (FLONASE) 50 MCG/ACT nasal spray PLACE 2 SPRAYS INTO BOTH NOSTRILS DAILY. (Patient not taking: Reported on 05/18/2021) 18 g 1   gabapentin (NEURONTIN) 300 MG capsule Take 1 capsule (300 mg total) by mouth at bedtime. NEEDS PASS 90 capsule 1   omeprazole (PRILOSEC) 40 MG capsule Take 1 capsule (40 mg total) by mouth in the morning and at bedtime. NEEDS PASS 180 capsule 0   traZODone (DESYREL) 100 MG tablet Take 0.5-1 tablets (50-100 mg total) by mouth at bedtime. NEEDS PASS 90 tablet 0   No facility-administered medications prior to visit.    Allergies  Allergen  Reactions   Latex Other (See Comments)    Infection in left middle finger from latex gloves due to moisture.   Iohexol      Code: RASH, Desc: PATIENT CALLED 06/17/08-STATED AFTER 6 HOURS POST IV CONTRAST, HANDS,SCALP AND GROIN AREA DEVELOPED ITCHING,BURNING SENSATION, Onset Date: [id number]        Objective:    BP (!) 167/72   Pulse (!) 59   Ht _0  (1.778 m)   Wt (!) 313 lb 2 oz (142 kg)   SpO2 100%   BMI 44.93 kg/m  Wt Readings from Last 3 Encounters:  05/18/21 (!) 313 lb 2 oz (142 kg)  09/29/20 (!) 318 lb 6.4 oz (144.4 kg)  09/24/19 (!) 342 lb 6.4 oz (155.3 kg)    Physical Exam Vitals and nursing note reviewed.  Constitutional:      Appearance: She is well-developed.  HENT:     Head: Normocephalic and atraumatic.  Cardiovascular:     Rate and Rhythm: Normal rate and regular rhythm.     Heart sounds: Normal heart sounds. No murmur heard.   No friction rub. No gallop.  Pulmonary:     Effort: Pulmonary effort is normal. No tachypnea or respiratory distress.     Breath sounds: Normal breath sounds. No decreased breath sounds, wheezing, rhonchi or rales.  Chest:     Chest wall: No tenderness.  Abdominal:     General: Bowel sounds are normal.     Palpations: Abdomen is soft.  Musculoskeletal:        General: Normal range of motion.     Cervical back: Normal range of motion.  Skin:    General: Skin is warm and dry.  Neurological:     Mental Status: She is alert and oriented to person, place, and time.     Coordination: Coordination normal.  Psychiatric:        Behavior: Behavior normal. Behavior is cooperative.        Thought Content: Thought content normal.        Judgment: Judgment normal.         Patient has been counseled  extensively about nutrition and exercise as well as the importance of adherence with medications and regular follow-up. The patient was given clear instructions to go to ER or return to medical center if symptoms don't improve, worsen or new  problems develop. The patient verbalized understanding.   Follow-up: Return for PAP SMEAR.   Gildardo Pounds, FNP-BC [facility] [place], [place]   05/20/2021, 5:53 PM

## 2021-05-19 LAB — CMP14+EGFR
ALT: 9 IU/L (ref 0–32)
AST: 12 IU/L (ref 0–40)
Albumin/Globulin Ratio: 1.9 (ref 1.2–2.2)
Albumin: 4.5 g/dL (ref 3.8–4.9)
Alkaline Phosphatase: 68 IU/L (ref 44–121)
BUN/Creatinine Ratio: 14 (ref 9–23)
BUN: 13 mg/dL (ref 6–24)
Bilirubin Total: 0.2 mg/dL (ref 0.0–1.2)
CO2: 27 mmol/L (ref 20–29)
Calcium: 9.9 mg/dL (ref 8.7–10.2)
Chloride: 101 mmol/L (ref 96–106)
Creatinine, Ser: 0.9 mg/dL (ref 0.57–1.00)
Globulin, Total: 2.4 g/dL (ref 1.5–4.5)
Glucose: 66 mg/dL — ABNORMAL LOW (ref 70–99)
Potassium: 4.7 mmol/L (ref 3.5–5.2)
Sodium: 142 mmol/L (ref 134–144)
Total Protein: 6.9 g/dL (ref 6.0–8.5)
eGFR: 75 mL/min/{1.73_m2} (ref >59)

## 2021-05-19 LAB — CBC
Hematocrit: 35.3 % (ref 34.0–46.6)
Hemoglobin: 11.7 g/dL (ref 11.1–15.9)
MCH: 27.9 pg (ref 26.6–33.0)
MCHC: 33.1 g/dL (ref 31.5–35.7)
MCV: 84 fL (ref 79–97)
Platelets: 358 10*3/uL (ref 150–450)
RBC: 4.19 x10E6/uL (ref 3.77–5.28)
RDW: 14 % (ref 11.7–15.4)
WBC: 7.7 10*3/uL (ref 3.4–10.8)

## 2021-05-20 ENCOUNTER — Encounter: Payer: Self-pay | Admitting: Nurse Practitioner

## 2021-06-03 ENCOUNTER — Other Ambulatory Visit: Payer: Self-pay

## 2021-06-15 ENCOUNTER — Other Ambulatory Visit: Payer: Self-pay

## 2021-06-15 ENCOUNTER — Encounter: Payer: Self-pay | Admitting: Hematology

## 2021-06-15 ENCOUNTER — Other Ambulatory Visit: Payer: Self-pay | Admitting: Nurse Practitioner

## 2021-06-15 ENCOUNTER — Other Ambulatory Visit: Payer: Self-pay | Admitting: Family Medicine

## 2021-06-15 DIAGNOSIS — E0590 Thyrotoxicosis, unspecified without thyrotoxic crisis or storm: Secondary | ICD-10-CM

## 2021-06-15 DIAGNOSIS — I10 Essential (primary) hypertension: Secondary | ICD-10-CM

## 2021-06-15 MED ORDER — GLIPIZIDE 5 MG PO TABS
5.0000 mg | ORAL_TABLET | Freq: Two times a day (BID) | ORAL | 0 refills | Status: DC
  Filled 2021-06-15: qty 60, 30d supply, fill #0

## 2021-06-15 MED ORDER — LISINOPRIL 40 MG PO TABS
40.0000 mg | ORAL_TABLET | Freq: Every day | ORAL | 1 refills | Status: DC
  Filled 2021-06-15: qty 30, 30d supply, fill #0
  Filled 2021-07-21: qty 30, 30d supply, fill #1
  Filled 2021-07-21 (×2): qty 30, 30d supply, fill #0
  Filled 2021-08-25: qty 30, 30d supply, fill #1
  Filled 2021-09-28: qty 30, 30d supply, fill #2
  Filled 2021-10-30 – 2021-11-10 (×2): qty 30, 30d supply, fill #3

## 2021-06-15 NOTE — Telephone Encounter (Signed)
Requested Prescriptions  Pending Prescriptions Disp Refills  . lisinopril (ZESTRIL) 40 MG tablet 90 tablet 1    Sig: Take 1 tablet (40 mg total) by mouth daily. NEEDS PASS     Cardiovascular:  ACE Inhibitors Failed - 06/15/2021  1:20 PM      Failed - Last BP in normal range    BP Readings from Last 1 Encounters:  05/18/21 (!) 167/72         Passed - Cr in normal range and within 180 days    Creatinine  Date Value Ref Range Status  07/08/2014 0.8 0.6 - 1.1 mg/dL Final   Creat  Date Value Ref Range Status  09/24/2019 0.81 0.50 - 1.05 mg/dL Final    Comment:    For patients >4 years of age, the reference limit for Creatinine is approximately 13% higher for people identified as African-American. .    Creatinine, Ser  Date Value Ref Range Status  05/18/2021 0.90 0.57 - 1.00 mg/dL Final         Passed - K in normal range and within 180 days    Potassium  Date Value Ref Range Status  05/18/2021 4.7 3.5 - 5.2 mmol/L Final  07/08/2014 4.6 3.5 - 5.1 mEq/L Final         Passed - Patient is not pregnant      Passed - Valid encounter within last 6 months    Recent Outpatient Visits          4 weeks ago Type 2 diabetes mellitus with hyperglycemia, with long-term current use of insulin [facility])   [place], [place] W, NP   7 months ago Type 2 diabetes mellitus with hyperglycemia, with long-term current use of insulin [facility])   [place], Jarome Matin, RPH-CPP   8 months ago Type 2 diabetes mellitus with hyperglycemia, with long-term current use of insulin [facility])   [place], Vernia Buff, NP      Future Appointments            In 1 week Gildardo Pounds, NP [place]           . methimazole (TAPAZOLE) 5 MG tablet 60 tablet 0    Sig: Take 1 tablet (5 mg total) by mouth 2 (two) times daily.     Not Delegated - Endocrinology:   Hyperthyroid Agents Failed - 06/15/2021  1:20 PM      Failed - This refill cannot be delegated      Failed - TSH in normal range and within 180 days    TSH  Date Value Ref Range Status  09/29/2020 1.120 0.450 - 4.500 uIU/mL Final         Passed - Valid encounter within last 6 months    Recent Outpatient Visits          4 weeks ago Type 2 diabetes mellitus with hyperglycemia, with long-term current use of insulin [facility])   [place], [place] W, NP   7 months ago Type 2 diabetes mellitus with hyperglycemia, with long-term current use of insulin [facility])   [place], Annie Main L, RPH-CPP   8 months ago Type 2 diabetes mellitus with hyperglycemia, with long-term current use of insulin [facility])   [place], Vernia Buff, NP  Future Appointments            In 1 week Gildardo Pounds, NP [place]

## 2021-06-15 NOTE — Telephone Encounter (Signed)
Requested medication (s) are due for refill today: yes  Requested medication (s) are on the active medication list: yes  Last refill:  05/15/21  Future visit scheduled: 06/24/21  Notes to clinic:  This medication can not be delegated, please assess.    Requested Prescriptions  Pending Prescriptions Disp Refills   methimazole (TAPAZOLE) 5 MG tablet 60 tablet 0    Sig: Take 1 tablet (5 mg total) by mouth 2 (two) times daily.     Not Delegated - Endocrinology:  Hyperthyroid Agents Failed - 06/15/2021  1:20 PM      Failed - This refill cannot be delegated      Failed - TSH in normal range and within 180 days    TSH  Date Value Ref Range Status  09/29/2020 1.120 0.450 - 4.500 uIU/mL Final          Passed - Valid encounter within last 6 months    Recent Outpatient Visits           4 weeks ago Type 2 diabetes mellitus with hyperglycemia, with long-term current use of insulin [facility])   [place], [place] W, NP   7 months ago Type 2 diabetes mellitus with hyperglycemia, with long-term current use of insulin [facility])   [place], Annie Main L, RPH-CPP   8 months ago Type 2 diabetes mellitus with hyperglycemia, with long-term current use of insulin [facility])   [place], Vernia Buff, NP       Future Appointments             In 1 week Gildardo Pounds, NP [place]            Signed Prescriptions Disp Refills   lisinopril (ZESTRIL) 40 MG tablet 90 tablet 1    Sig: Take 1 tablet (40 mg total) by mouth daily. NEEDS PASS     Cardiovascular:  ACE Inhibitors Failed - 06/15/2021  1:20 PM      Failed - Last BP in normal range    BP Readings from Last 1 Encounters:  05/18/21 (!) 167/72          Passed - Cr in normal range and within 180 days    Creatinine  Date Value Ref Range Status  07/08/2014 0.8 0.6 - 1.1 mg/dL Final   Creat  Date  Value Ref Range Status  09/24/2019 0.81 0.50 - 1.05 mg/dL Final    Comment:    For patients >17 years of age, the reference limit for Creatinine is approximately 13% higher for people identified as African-American. .    Creatinine, Ser  Date Value Ref Range Status  05/18/2021 0.90 0.57 - 1.00 mg/dL Final          Passed - K in normal range and within 180 days    Potassium  Date Value Ref Range Status  05/18/2021 4.7 3.5 - 5.2 mmol/L Final  07/08/2014 4.6 3.5 - 5.1 mEq/L Final          Passed - Patient is not pregnant      Passed - Valid encounter within last 6 months    Recent Outpatient Visits           4 weeks ago Type 2 diabetes mellitus with hyperglycemia, with long-term current use of insulin [facility])   [place], Vernia Buff, NP   7 months ago Type  2 diabetes mellitus with hyperglycemia, with long-term current use of insulin [facility])   [place], Jarome Matin, RPH-CPP   8 months ago Type 2 diabetes mellitus with hyperglycemia, with long-term current use of insulin [facility])   [place], Vernia Buff, NP       Future Appointments             In 1 week Gildardo Pounds, NP [place]

## 2021-06-15 NOTE — Telephone Encounter (Signed)
Requested Prescriptions  Pending Prescriptions Disp Refills  . glipiZIDE (GLUCOTROL) 5 MG tablet 60 tablet 0    Sig: Take 1 tablet (5 mg total) by mouth 2 (two) times daily before a meal.     Endocrinology:  Diabetes - Sulfonylureas Passed - 06/15/2021  1:20 PM      Passed - HBA1C is between 0 and 7.9 and within 180 days    Hemoglobin A1C  Date Value Ref Range Status  05/18/2021 7.9 (A) 4.0 - 5.6 % Final   HbA1c, POC (controlled diabetic range)  Date Value Ref Range Status  09/29/2020 9.2 (A) 0.0 - 7.0 % Final         Passed - Valid encounter within last 6 months    Recent Outpatient Visits          4 weeks ago Type 2 diabetes mellitus with hyperglycemia, with long-term current use of insulin [facility])   [place], [place] W, NP   7 months ago Type 2 diabetes mellitus with hyperglycemia, with long-term current use of insulin [facility])   [place], Jarome Matin, RPH-CPP   8 months ago Type 2 diabetes mellitus with hyperglycemia, with long-term current use of insulin [facility])   [place], Vernia Buff, NP      Future Appointments            In 1 week Gildardo Pounds, NP [place]

## 2021-06-16 ENCOUNTER — Other Ambulatory Visit: Payer: Self-pay

## 2021-06-16 ENCOUNTER — Encounter: Payer: Self-pay | Admitting: Hematology

## 2021-06-16 MED ORDER — METHIMAZOLE 5 MG PO TABS
5.0000 mg | ORAL_TABLET | Freq: Two times a day (BID) | ORAL | 2 refills | Status: DC
  Filled 2021-06-16: qty 60, 30d supply, fill #0
  Filled 2021-07-21: qty 60, 30d supply, fill #1
  Filled 2021-07-21: qty 60, 30d supply, fill #0
  Filled 2021-08-25: qty 60, 30d supply, fill #1

## 2021-06-17 ENCOUNTER — Other Ambulatory Visit: Payer: Self-pay

## 2021-06-24 ENCOUNTER — Other Ambulatory Visit (HOSPITAL_COMMUNITY)
Admission: RE | Admit: 2021-06-24 | Discharge: 2021-06-24 | Disposition: A | Discharge status: Home / Self Care | Payer: Self-pay | Source: Ambulatory Visit | Attending: Nurse Practitioner | Admitting: Nurse Practitioner

## 2021-06-24 ENCOUNTER — Encounter: Payer: Self-pay | Admitting: Nurse Practitioner

## 2021-06-24 ENCOUNTER — Encounter: Payer: Self-pay | Admitting: Hematology

## 2021-06-24 ENCOUNTER — Other Ambulatory Visit: Payer: Self-pay

## 2021-06-24 ENCOUNTER — Ambulatory Visit: Payer: Self-pay | Attending: Nurse Practitioner | Admitting: Nurse Practitioner

## 2021-06-24 VITALS — BP 161/79 | HR 67 | Ht 70.0 in | Wt 312.0 lb

## 2021-06-24 DIAGNOSIS — Z114 Encounter for screening for human immunodeficiency virus [HIV]: Secondary | ICD-10-CM

## 2021-06-24 DIAGNOSIS — Z124 Encounter for screening for malignant neoplasm of cervix: Secondary | ICD-10-CM | POA: Insufficient documentation

## 2021-06-24 DIAGNOSIS — B3731 Acute candidiasis of vulva and vagina: Secondary | ICD-10-CM

## 2021-06-24 DIAGNOSIS — R300 Dysuria: Secondary | ICD-10-CM

## 2021-06-24 LAB — POCT URINALYSIS DIP (CLINITEK)
Bilirubin, UA: NEGATIVE
Blood, UA: NEGATIVE
Glucose, UA: NEGATIVE mg/dL
Ketones, POC UA: NEGATIVE mg/dL
Nitrite, UA: NEGATIVE
POC PROTEIN,UA: NEGATIVE
Spec Grav, UA: 1.025 (ref 1.010–1.025)
Urobilinogen, UA: 0.2 E.U./dL
pH, UA: 6 (ref 5.0–8.0)

## 2021-06-24 MED ORDER — FLUCONAZOLE 150 MG PO TABS
150.0000 mg | ORAL_TABLET | Freq: Once | ORAL | 0 refills | Status: AC
  Filled 2021-06-24: qty 1, 1d supply, fill #0

## 2021-06-24 NOTE — Progress Notes (Signed)
Assessment & Plan:  Kristina Adams was seen today for gynecologic exam.  Diagnoses and all orders for this visit:  Encounter for Papanicolaou smear for cervical cancer screening -     Cytology - PAP -     Cervicovaginal ancillary only  Vaginal candidiasis -     fluconazole (DIFLUCAN) 150 MG tablet; Take 1 tablet (150 mg total) by mouth once for 1 dose.  Encounter for screening for HIV -     HIV antibody (with reflex)    Patient has been counseled on age-appropriate routine health concerns for screening and prevention. These are reviewed and up-to-date. Referrals have been placed accordingly. Immunizations are up-to-date or declined.    Subjective:   Chief Complaint  Patient presents with   Gynecologic Exam   HPI Kristina Adams 55 y.o. female presents to office today for Pap smear She has complaints of possible vaginal yeast infection with increased vaginal irritation and discharge  Review of Systems  Constitutional: Negative.  Negative for chills, fever, malaise/fatigue and weight loss.  Respiratory: Negative.  Negative for cough, shortness of breath and wheezing.   Cardiovascular: Negative.  Negative for chest pain, orthopnea and leg swelling.  Gastrointestinal:  Negative for abdominal pain.  Genitourinary:  Negative for flank pain.       See HPI  Skin: Negative.  Negative for rash.  Psychiatric/Behavioral:  Negative for suicidal ideas.    Past Medical History:  Diagnosis Date   Anemia 12/09/2011   child birth   Appendicitis, acute 12/03/2011   Chronic back pain 12/03/2011   Degenerative disc disease, cervical    low back area   Diabetes mellitus type 2, insulin dependent ([place]) 12/03/2011   GERD (gastroesophageal reflux disease)    H/O hiatal hernia    Hypertension 12/03/2011   Morbid obesity with BMI of 45.0-49.9, adult ([place]) 12/03/2011   Sleep apnea 12/03/2011   last sleep study May 2013   Thyroid disease     Past Surgical History:  Procedure Laterality Date    CESAREAN SECTION     LAPAROSCOPIC APPENDECTOMY  12/03/2011   Procedure: APPENDECTOMY LAPAROSCOPIC;  Surgeon: [name], DO;  Location: [facility];  Service: General;  Laterality: N/A;  drainage of intra-abdominal abscess    PARS PLANA VITRECTOMY Left 08/17/2012   Procedure: PARS PLANA VITRECTOMY WITH 25 GAUGE;  Surgeon: [name], MD;  Location: [place];  Service: Ophthalmology;  Laterality: Left;   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   Outpatient Medications Prior to Visit  Medication Sig Dispense Refill   cyclobenzaprine (FLEXERIL) 10 MG tablet Take 1 tablet (10 mg total) by mouth 3 (three) times daily as needed for muscle spasms. 30 tablet 1   diclofenac Sodium (VOLTAREN) 1 % GEL APPLY 4 G TOPICALLY 4 (FOUR)  TIMES DAILY. 350 g 6   Dulaglutide (TRULICITY) 3 NI/7.7OE SOPN Inject 3 mg as directed once a week. 4 mL 4   fenofibrate 160 MG tablet TAKE 1 TABLET (160 MG TOTAL) BY MOUTH DAILY. 90 tablet 0   gabapentin (NEURONTIN) 300 MG capsule TAKE 1 CAPSULE (300 MG TOTAL) BY MOUTH AT BEDTIME. 90 capsule 1   glipiZIDE (GLUCOTROL) 5 MG tablet Take 1 tablet (5 mg total) by mouth 2 (two) times daily before a meal. 60 tablet 0   hydrochlorothiazide (HYDRODIURIL) 25 MG tablet TAKE 1 TABLET (25 MG TOTAL) BY MOUTH DAILY. 90 tablet 0   Insulin Glargine (BASAGLAR KWIKPEN) 100 UNIT/ML Inject 80 units subcutaneous daily. 72 mL 3   Insulin Pen Needle (B-D UF III  MINI PEN NEEDLES) 31G X 5 MM MISC Use as instructed. Inject into the skin twice daily. NEEDS PASS 100 each 6   Insulin Pen Needle 31G X 5 MM MISC USE AS INSTRUCTED. INJECT INTO THE SKIN TWICE DAILY 100 each 6   lisinopril (ZESTRIL) 40 MG tablet Take 1 tablet (40 mg total) by mouth daily. NEEDS PASS 90 tablet 1   methimazole (TAPAZOLE) 5 MG tablet Take 1 tablet (5 mg total) by mouth 2 (two) times daily. 60 tablet 2   pantoprazole (PROTONIX) 40 MG tablet Take 1 tablet (40 mg total) by mouth 2 (two) times daily. 180 tablet 1   vitamin C (ASCORBIC ACID) 500 MG tablet Take 1,000 mg by mouth daily.     Cholecalciferol (VITAMIN D-3) 125 MCG (5000 UT) TABS Take 1 tablet by mouth daily. (Patient not taking: Reported on 06/24/2021) 90 tablet 3   traZODone (DESYREL) 100 MG tablet TAKE 0.5-1 TABLETS (50-100 MG TOTAL) BY MOUTH AT BEDTIME. (Patient not taking: Reported on 06/24/2021) 90 tablet 0   No facility-administered medications prior to visit.    Allergies  Allergen Reactions   Latex Other (See Comments)    Infection in left middle finger from latex gloves due to moisture.   Iohexol      Code: RASH, Desc: PATIENT CALLED 06/17/08-STATED AFTER 6 HOURS POST IV CONTRAST, HANDS,SCALP AND GROIN AREA DEVELOPED ITCHING,BURNING SENSATION, Onset Date: [id number]        Objective:    BP (!) 161/79    Pulse 67    Ht 5\' 10"  (1.778 m)    Wt (!) 312 lb (141.5 kg)    SpO2 98%    BMI 44.77 kg/m  Wt Readings from Last 3 Encounters:  06/24/21 (!) 312 lb (141.5 kg)  05/18/21 (!) 313 lb 2 oz (142 kg)  09/29/20 (!) 318 lb 6.4 oz (144.4 kg)    Physical Exam Exam conducted with a chaperone present.  Constitutional:      Appearance: She is well-developed.  HENT:     Head: Normocephalic.  Cardiovascular:     Rate and Rhythm: Normal rate and regular rhythm.     Heart sounds: Normal heart sounds.  Pulmonary:     Effort: Pulmonary effort is normal.     Breath sounds: Normal breath sounds.  Abdominal:      General: Bowel sounds are normal.     Palpations: Abdomen is soft.     Hernia: There is no hernia in the left inguinal area.  Genitourinary:    Exam position: Lithotomy position.     Labia:        Right: No rash, tenderness, lesion or injury.        Left: No rash, tenderness, lesion or injury.  Vagina: No signs of injury and foreign body. Vaginal discharge present. No erythema, tenderness or bleeding.     Cervix: No cervical motion tenderness or friability.     Uterus: Not deviated and not enlarged.      Adnexa:        Right: No mass, tenderness or fullness.         Left: No mass, tenderness or fullness.       Rectum: Normal. No external hemorrhoid.  Lymphadenopathy:     Lower Body: No right inguinal adenopathy. No left inguinal adenopathy.  Skin:    General: Skin is warm and dry.  Neurological:     Mental Status: She is alert and oriented to person, place, and time.  Psychiatric:        Behavior: Behavior normal.        Thought Content: Thought content normal.        Judgment: Judgment normal.         Patient has been counseled extensively about nutrition and exercise as well as the importance of adherence with medications and regular follow-up. The patient was given clear instructions to go to ER or return to medical center if symptoms don't improve, worsen or new problems develop. The patient verbalized understanding.   Follow-up: Return for see me after february 7th for A1c.   Gildardo Pounds, FNP-BC [facility] and [place], [place]   06/24/2021, 10:14 AM

## 2021-06-25 ENCOUNTER — Other Ambulatory Visit: Payer: Self-pay

## 2021-06-25 LAB — CERVICOVAGINAL ANCILLARY ONLY
Bacterial Vaginitis (gardnerella): NEGATIVE
Candida Glabrata: POSITIVE — AB
Candida Vaginitis: POSITIVE — AB
Chlamydia: NEGATIVE
Comment: NEGATIVE
Comment: NEGATIVE
Comment: NEGATIVE
Comment: NEGATIVE
Comment: NEGATIVE
Comment: NORMAL
Neisseria Gonorrhea: NEGATIVE
Trichomonas: NEGATIVE

## 2021-06-25 LAB — HIV ANTIBODY (ROUTINE TESTING W REFLEX): HIV Screen 4th Generation wRfx: NONREACTIVE

## 2021-06-26 ENCOUNTER — Other Ambulatory Visit: Payer: Self-pay | Admitting: Nurse Practitioner

## 2021-06-26 LAB — CYTOLOGY - PAP
Adequacy: ABSENT
Comment: NEGATIVE
Diagnosis: NEGATIVE
High risk HPV: NEGATIVE

## 2021-06-26 MED ORDER — FLUCONAZOLE 150 MG PO TABS
150.0000 mg | ORAL_TABLET | Freq: Once | ORAL | 1 refills | Status: AC
  Filled 2021-06-26: qty 1, 1d supply, fill #0

## 2021-06-29 ENCOUNTER — Other Ambulatory Visit: Payer: Self-pay

## 2021-06-29 ENCOUNTER — Encounter: Payer: Self-pay | Admitting: Hematology

## 2021-07-02 ENCOUNTER — Encounter: Payer: Self-pay | Admitting: Hematology

## 2021-07-02 ENCOUNTER — Other Ambulatory Visit: Payer: Self-pay

## 2021-07-06 ENCOUNTER — Other Ambulatory Visit: Payer: Self-pay | Admitting: Nurse Practitioner

## 2021-07-06 DIAGNOSIS — B3731 Acute candidiasis of vulva and vagina: Secondary | ICD-10-CM

## 2021-07-07 ENCOUNTER — Encounter: Payer: Self-pay | Admitting: Hematology

## 2021-07-07 ENCOUNTER — Other Ambulatory Visit: Payer: Self-pay

## 2021-07-07 DIAGNOSIS — B3731 Acute candidiasis of vulva and vagina: Secondary | ICD-10-CM

## 2021-07-07 MED ORDER — FLUCONAZOLE 150 MG PO TABS
150.0000 mg | ORAL_TABLET | Freq: Once | ORAL | 0 refills | Status: AC
  Filled 2021-07-07: qty 1, 1d supply, fill #0

## 2021-07-09 ENCOUNTER — Other Ambulatory Visit: Payer: Self-pay

## 2021-07-10 ENCOUNTER — Other Ambulatory Visit: Payer: Self-pay

## 2021-07-21 ENCOUNTER — Other Ambulatory Visit: Payer: Self-pay | Admitting: Nurse Practitioner

## 2021-07-21 ENCOUNTER — Encounter: Payer: Self-pay | Admitting: Hematology

## 2021-07-21 ENCOUNTER — Other Ambulatory Visit: Payer: Self-pay

## 2021-07-21 MED ORDER — GLIPIZIDE 5 MG PO TABS
5.0000 mg | ORAL_TABLET | Freq: Two times a day (BID) | ORAL | 0 refills | Status: DC
  Filled 2021-07-21: qty 60, 30d supply, fill #0

## 2021-07-21 NOTE — Telephone Encounter (Signed)
Requested Prescriptions  Pending Prescriptions Disp Refills   glipiZIDE (GLUCOTROL) 5 MG tablet 60 tablet 0    Sig: Take 1 tablet (5 mg total) by mouth 2 (two) times daily before a meal.     Endocrinology:  Diabetes - Sulfonylureas Passed - 07/21/2021 12:24 PM      Passed - HBA1C is between 0 and 7.9 and within 180 days    Hemoglobin A1C  Date Value Ref Range Status  05/18/2021 7.9 (A) 4.0 - 5.6 % Final   HbA1c, POC (controlled diabetic range)  Date Value Ref Range Status  09/29/2020 9.2 (A) 0.0 - 7.0 % Final         Passed - Valid encounter within last 6 months    Recent Outpatient Visits          3 weeks ago Encounter for Papanicolaou smear for cervical cancer screening   [place], [place] W, NP   2 months ago Type 2 diabetes mellitus with hyperglycemia, with long-term current use of insulin [facility])   [place], [place] W, NP   8 months ago Type 2 diabetes mellitus with hyperglycemia, with long-term current use of insulin [facility])   [place], Annie Main L, RPH-CPP   9 months ago Type 2 diabetes mellitus with hyperglycemia, with long-term current use of insulin [facility])   [place], Vernia Buff, NP      Future Appointments            In 4 weeks Gildardo Pounds, NP [place]

## 2021-07-23 ENCOUNTER — Other Ambulatory Visit: Payer: Self-pay

## 2021-08-19 ENCOUNTER — Ambulatory Visit: Payer: Self-pay | Admitting: Nurse Practitioner

## 2021-08-25 ENCOUNTER — Other Ambulatory Visit: Payer: Self-pay | Admitting: Nurse Practitioner

## 2021-08-26 ENCOUNTER — Encounter: Payer: Self-pay | Admitting: Hematology

## 2021-08-26 ENCOUNTER — Other Ambulatory Visit: Payer: Self-pay

## 2021-08-26 MED ORDER — FENOFIBRATE 160 MG PO TABS
ORAL_TABLET | Freq: Every day | ORAL | 0 refills | Status: DC
  Filled 2021-08-26: qty 30, 30d supply, fill #0
  Filled 2021-09-28: qty 30, 30d supply, fill #1
  Filled 2021-10-30 – 2021-11-10 (×2): qty 30, 30d supply, fill #2

## 2021-08-26 MED ORDER — HYDROCHLOROTHIAZIDE 25 MG PO TABS
25.0000 mg | ORAL_TABLET | Freq: Every day | ORAL | 0 refills | Status: DC
  Filled 2021-08-26: qty 30, 30d supply, fill #0
  Filled 2021-09-28: qty 30, 30d supply, fill #1
  Filled 2021-10-30 – 2021-11-10 (×2): qty 30, 30d supply, fill #2

## 2021-08-26 MED ORDER — GLIPIZIDE 5 MG PO TABS
5.0000 mg | ORAL_TABLET | Freq: Two times a day (BID) | ORAL | 0 refills | Status: DC
Start: 2021-08-26 — End: 2021-09-28
  Filled 2021-08-26: qty 60, 30d supply, fill #0

## 2021-08-26 NOTE — Telephone Encounter (Signed)
Requested Prescriptions  Pending Prescriptions Disp Refills   hydrochlorothiazide (HYDRODIURIL) 25 MG tablet 90 tablet 0    Sig: TAKE 1 TABLET (25 MG TOTAL) BY MOUTH DAILY.     Cardiovascular: Diuretics - Thiazide Failed - 08/25/2021  1:32 PM      Failed - Last BP in normal range    BP Readings from Last 1 Encounters:  06/24/21 (!) 161/79         Passed - Cr in normal range and within 180 days    Creatinine  Date Value Ref Range Status  07/08/2014 0.8 0.6 - 1.1 mg/dL Final   Creat  Date Value Ref Range Status  09/24/2019 0.81 0.50 - 1.05 mg/dL Final    Comment:    For patients >19 years of age, the reference limit for Creatinine is approximately 13% higher for people identified as African-American. .    Creatinine, Ser  Date Value Ref Range Status  05/18/2021 0.90 0.57 - 1.00 mg/dL Final         Passed - K in normal range and within 180 days    Potassium  Date Value Ref Range Status  05/18/2021 4.7 3.5 - 5.2 mmol/L Final  07/08/2014 4.6 3.5 - 5.1 mEq/L Final         Passed - Na in normal range and within 180 days    Sodium  Date Value Ref Range Status  05/18/2021 142 134 - 144 mmol/L Final  07/08/2014 138 136 - 145 mEq/L Final         Passed - Valid encounter within last 6 months    Recent Outpatient Visits          2 months ago Encounter for Papanicolaou smear for cervical cancer screening   [place], [place] W, NP   3 months ago Type 2 diabetes mellitus with hyperglycemia, with long-term current use of insulin [facility])   [place], [place] W, NP   10 months ago Type 2 diabetes mellitus with hyperglycemia, with long-term current use of insulin [facility])   [place], Annie Main L, RPH-CPP   11 months ago Type 2 diabetes mellitus with hyperglycemia, with long-term current use of insulin [facility])   [place], Vernia Buff,  NP      Future Appointments            In 2 months Gildardo Pounds, NP [place]            fenofibrate 160 MG tablet 90 tablet 0    Sig: TAKE 1 TABLET (160 MG TOTAL) BY MOUTH DAILY.     Cardiovascular:  Antilipid - Fibric Acid Derivatives Failed - 08/25/2021  1:32 PM      Failed - Lipid Panel in normal range within the last 12 months    Cholesterol, Total  Date Value Ref Range Status  09/29/2020 191 100 - 199 mg/dL Final   LDL Cholesterol (Calc)  Date Value Ref Range Status  09/24/2019 110 (H) mg/dL (calc) Final    Comment:    Reference range: <100 . Desirable range <100 mg/dL for primary prevention;   <70 mg/dL for patients with CHD or diabetic patients  with > or = 2 CHD risk factors. [place] LDL-C is now calculated using the Martin-Hopkins  calculation, which is a validated novel method providing  better accuracy than the Friedewald equation in the  estimation of LDL-C.  Cresenciano Genre et al. Annamaria Helling. 4166;063(01): 2061-2068  (http://education.[employer].com/faq/FAQ164)    LDL Chol Calc (NIH)  Date Value Ref Range Status  09/29/2020 119 (H) 0 - 99 mg/dL Final   HDL  Date Value Ref Range Status  09/29/2020 49 >39 mg/dL Final   Triglycerides  Date Value Ref Range Status  09/29/2020 131 0 - 149 mg/dL Final         Passed - ALT in normal range and within 360 days    ALT  Date Value Ref Range Status  05/18/2021 9 0 - 32 IU/L Final  07/08/2014 13 0 - 55 U/L Final         Passed - AST in normal range and within 360 days    AST  Date Value Ref Range Status  05/18/2021 12 0 - 40 IU/L Final  07/08/2014 10 5 - 34 U/L Final         Passed - Cr in normal range and within 360 days    Creatinine  Date Value Ref Range Status  07/08/2014 0.8 0.6 - 1.1 mg/dL Final   Creat  Date Value Ref Range Status  09/24/2019 0.81 0.50 - 1.05 mg/dL Final    Comment:    For patients >55 years of age, the reference limit for Creatinine is  approximately 13% higher for people identified as African-American. .    Creatinine, Ser  Date Value Ref Range Status  05/18/2021 0.90 0.57 - 1.00 mg/dL Final         Passed - HGB in normal range and within 360 days    Hemoglobin  Date Value Ref Range Status  05/18/2021 11.7 11.1 - 15.9 g/dL Final   HGB  Date Value Ref Range Status  05/04/2016 10.9 (L) 11.6 - 15.9 g/dL Final         Passed - HCT in normal range and within 360 days    HCT  Date Value Ref Range Status  05/04/2016 33.6 (L) 34.8 - 46.6 % Final   Hematocrit  Date Value Ref Range Status  05/18/2021 35.3 34.0 - 46.6 % Final         Passed - PLT in normal range and within 360 days    Platelets  Date Value Ref Range Status  05/18/2021 358 150 - 450 x10E3/uL Final         Passed - WBC in normal range and within 360 days    WBC  Date Value Ref Range Status  05/18/2021 7.7 3.4 - 10.8 x10E3/uL Final  09/24/2019 6.4 3.8 - 10.8 Thousand/uL Final         Passed - eGFR is 30 or above and within 360 days    GFR calc Af Amer  Date Value Ref Range Status  08/17/2012 >90 >90 mL/min Final    Comment:           The eGFR has been calculated using the CKD EPI equation. This calculation has not been validated in all clinical situations. eGFR's persistently <90 mL/min signify possible Chronic Kidney Disease.   GFR calc non Af Amer  Date Value Ref Range Status  08/17/2012 >90 >90 mL/min Final   eGFR  Date Value Ref Range Status  05/18/2021 75 >59 mL/min/1.73 Final         Passed - Valid encounter within last 12 months    Recent Outpatient Visits          2 months ago Encounter for Papanicolaou smear for cervical cancer  screening   [place], [place] W, NP   3 months ago Type 2 diabetes mellitus with hyperglycemia, with long-term current use of insulin [facility])   [place], [place], NP   10 months ago Type 2 diabetes mellitus with  hyperglycemia, with long-term current use of insulin [facility])   [place], Jarome Matin, RPH-CPP   11 months ago Type 2 diabetes mellitus with hyperglycemia, with long-term current use of insulin ([place])   [place], Vernia Buff, NP      Future Appointments            In 2 months Gildardo Pounds, NP [place]            glipiZIDE (GLUCOTROL) 5 MG tablet 60 tablet 0    Sig: Take 1 tablet (5 mg total) by mouth 2 (two) times daily before a meal.     Endocrinology:  Diabetes - Sulfonylureas Passed - 08/25/2021  1:32 PM      Passed - HBA1C is between 0 and 7.9 and within 180 days    Hemoglobin A1C  Date Value Ref Range Status  05/18/2021 7.9 (A) 4.0 - 5.6 % Final   HbA1c, POC (controlled diabetic range)  Date Value Ref Range Status  09/29/2020 9.2 (A) 0.0 - 7.0 % Final         Passed - Cr in normal range and within 360 days    Creatinine  Date Value Ref Range Status  07/08/2014 0.8 0.6 - 1.1 mg/dL Final   Creat  Date Value Ref Range Status  09/24/2019 0.81 0.50 - 1.05 mg/dL Final    Comment:    For patients >14 years of age, the reference limit for Creatinine is approximately 13% higher for people identified as African-American. .    Creatinine, Ser  Date Value Ref Range Status  05/18/2021 0.90 0.57 - 1.00 mg/dL Final         Passed - Valid encounter within last 6 months    Recent Outpatient Visits          2 months ago Encounter for Papanicolaou smear for cervical cancer screening   [place], [place] W, NP   3 months ago Type 2 diabetes mellitus with hyperglycemia, with long-term current use of insulin [facility])   [place], [place] W, NP   10 months ago Type 2 diabetes mellitus with hyperglycemia, with long-term current use of insulin [facility])   [place], Jarome Matin, RPH-CPP   11 months ago Type 2 diabetes mellitus with hyperglycemia, with long-term current use of insulin [facility])   [place], [place] W, NP      Future Appointments            In 2 months Gildardo Pounds, NP [place]

## 2021-08-31 ENCOUNTER — Other Ambulatory Visit: Payer: Self-pay

## 2021-09-06 ENCOUNTER — Encounter: Payer: Self-pay | Admitting: Nurse Practitioner

## 2021-09-23 ENCOUNTER — Telehealth: Payer: Self-pay | Admitting: Gastroenterology

## 2021-09-23 NOTE — Telephone Encounter (Signed)
Good Afternoon Dr. Ardis Hughs, ? ? ?Patient called and stated that she has a referral in for a colonoscopy. After looking in her chart I seen that she had been seen by you in the past and she last was seen by [facility] in 2016. Patient also stated that she is having abdominal pain. Records are in Epic, will you please review and advise on scheduling? ? ?Thank you.   ?

## 2021-09-28 ENCOUNTER — Encounter: Payer: Self-pay | Admitting: Hematology

## 2021-09-28 ENCOUNTER — Other Ambulatory Visit: Payer: Self-pay

## 2021-09-28 ENCOUNTER — Other Ambulatory Visit: Payer: Self-pay | Admitting: Nurse Practitioner

## 2021-09-28 ENCOUNTER — Other Ambulatory Visit: Payer: Self-pay | Admitting: Family Medicine

## 2021-09-28 DIAGNOSIS — E0590 Thyrotoxicosis, unspecified without thyrotoxic crisis or storm: Secondary | ICD-10-CM

## 2021-09-28 DIAGNOSIS — E1165 Type 2 diabetes mellitus with hyperglycemia: Secondary | ICD-10-CM

## 2021-09-29 ENCOUNTER — Other Ambulatory Visit: Payer: Self-pay

## 2021-09-29 DIAGNOSIS — Z1231 Encounter for screening mammogram for malignant neoplasm of breast: Secondary | ICD-10-CM

## 2021-09-29 NOTE — Telephone Encounter (Signed)
Patient was scheduled for an OV 4/19 at 11:10. ? ? ?Thank you Dr. Ardis Hughs   ?

## 2021-09-30 ENCOUNTER — Other Ambulatory Visit: Payer: Self-pay

## 2021-09-30 ENCOUNTER — Encounter: Payer: Self-pay | Admitting: Hematology

## 2021-09-30 MED ORDER — METHIMAZOLE 5 MG PO TABS
5.0000 mg | ORAL_TABLET | Freq: Two times a day (BID) | ORAL | 1 refills | Status: DC
  Filled 2021-09-30 – 2021-10-08 (×2): qty 60, 30d supply, fill #0
  Filled 2021-10-30 – 2021-11-10 (×2): qty 60, 30d supply, fill #1

## 2021-09-30 MED ORDER — GLIPIZIDE 5 MG PO TABS
5.0000 mg | ORAL_TABLET | Freq: Two times a day (BID) | ORAL | 0 refills | Status: DC
Start: 2021-09-30 — End: 2021-11-27
  Filled 2021-09-30: qty 60, 30d supply, fill #0
  Filled 2021-10-08: qty 180, 90d supply, fill #0

## 2021-09-30 MED ORDER — TRULICITY 3 MG/0.5ML ~~LOC~~ SOAJ
3.0000 mg | SUBCUTANEOUS | 2 refills | Status: DC
  Filled 2021-09-30 – 2021-10-08 (×2): qty 2, 28d supply, fill #0
  Filled 2021-10-30 – 2021-11-10 (×2): qty 2, 28d supply, fill #1
  Filled 2021-12-10: qty 2, 28d supply, fill #2

## 2021-09-30 NOTE — Telephone Encounter (Signed)
Requested medication (s) are due for refill today:   Provider to review ? ?Requested medication (s) are on the active medication list:   Yes ? ?Future visit scheduled:   Yes ? ? ?Last ordered: 06/16/2021 #60, 2 refills ? ?Returned because it's a non delegated refill.  ? ?Requested Prescriptions  ?Pending Prescriptions Disp Refills  ? methimazole (TAPAZOLE) 5 MG tablet 60 tablet 2  ?  Sig: Take 1 tablet (5 mg total) by mouth 2 (two) times daily.  ?  ? Not Delegated - Endocrinology:  Hyperthyroid Agents Failed - 09/28/2021 12:19 PM  ?  ?  Failed - This refill cannot be delegated  ?  ?  Failed - TSH in normal range and within 180 days  ?  TSH  ?Date Value Ref Range Status  ?09/29/2020 1.120 0.450 - 4.500 uIU/mL Final  ?  ?  ?  ?  Failed - T3 Total in normal range and within 180 days  ?  No results found for: T3TOTAL, T3FREE  ?  ?  ?  Failed - T4 free in normal range and within 180 days  ?  T4, Total  ?Date Value Ref Range Status  ?09/24/2019 6.9 5.1 - 11.9 mcg/dL Final  ?  ?  ?  ?  Passed - Valid encounter within last 6 months  ?  Recent Outpatient Visits   ? ?      ? 3 months ago Encounter for Papanicolaou smear for cervical cancer screening  ? [place], [place] W, NP  ? 4 months ago Type 2 diabetes mellitus with hyperglycemia, with long-term current use of insulin ([place])  ? [place], [place] W, NP  ? 11 months ago Type 2 diabetes mellitus with hyperglycemia, with long-term current use of insulin ([place])  ? [place], RPH-CPP  ? 1 year ago Type 2 diabetes mellitus with hyperglycemia, with long-term current use of insulin ([place])  ? [place] Gildardo Pounds, NP  ? ?  ?  ?Future Appointments   ? ?        ? In 3 weeks Gildardo Pounds, NP [place]  ? ?  ? ?  ?  ?  ? ?

## 2021-09-30 NOTE — Telephone Encounter (Signed)
Requested Prescriptions  ?Pending Prescriptions Disp Refills  ?? Dulaglutide (TRULICITY) 3 QB/3.4LP SOPN 2 mL 2  ?  Sig: Inject 3 mg as directed once a week.  ?  ? Endocrinology:  Diabetes - GLP-1 Receptor Agonists Passed - 09/28/2021 12:19 PM  ?  ?  Passed - HBA1C is between 0 and 7.9 and within 180 days  ?  Hemoglobin A1C  ?Date Value Ref Range Status  ?05/18/2021 7.9 (A) 4.0 - 5.6 % Final  ? ?HbA1c, POC (controlled diabetic range)  ?Date Value Ref Range Status  ?09/29/2020 9.2 (A) 0.0 - 7.0 % Final  ?   ?  ?  Passed - Valid encounter within last 6 months  ?  Recent Outpatient Visits   ?      ? 3 months ago Encounter for Papanicolaou smear for cervical cancer screening  ? [place], [place] W, NP  ? 4 months ago Type 2 diabetes mellitus with hyperglycemia, with long-term current use of insulin ([place])  ? [place], [place] W, NP  ? 11 months ago Type 2 diabetes mellitus with hyperglycemia, with long-term current use of insulin ([place])  ? [place], RPH-CPP  ? 1 year ago Type 2 diabetes mellitus with hyperglycemia, with long-term current use of insulin ([place])  ? [place] Gildardo Pounds, NP  ?  ?  ?Future Appointments   ?        ? In 3 weeks Gildardo Pounds, NP [place]  ?  ? ?  ?  ?  ?? glipiZIDE (GLUCOTROL) 5 MG tablet 180 tablet 0  ?  Sig: Take 1 tablet (5 mg total) by mouth 2 (two) times daily before a meal.  ?  ? Endocrinology:  Diabetes - Sulfonylureas Passed - 09/28/2021 12:19 PM  ?  ?  Passed - HBA1C is between 0 and 7.9 and within 180 days  ?  Hemoglobin A1C  ?Date Value Ref Range Status  ?05/18/2021 7.9 (A) 4.0 - 5.6 % Final  ? ?HbA1c, POC (controlled diabetic range)  ?Date Value Ref Range Status  ?09/29/2020 9.2 (A) 0.0 - 7.0 % Final  ?   ?  ?  Passed - Cr in normal range and within 360 days  ?  Creatinine  ?Date  Value Ref Range Status  ?07/08/2014 0.8 0.6 - 1.1 mg/dL Final  ? ?Creat  ?Date Value Ref Range Status  ?09/24/2019 0.81 0.50 - 1.05 mg/dL Final  ?  Comment:  ?  For patients >67 years of age, the reference limit ?for Creatinine is approximately 13% higher for people ?identified as African-American. ?. ?  ? ?Creatinine, Ser  ?Date Value Ref Range Status  ?05/18/2021 0.90 0.57 - 1.00 mg/dL Final  ?   ?  ?  Passed - Valid encounter within last 6 months  ?  Recent Outpatient Visits   ?      ? 3 months ago Encounter for Papanicolaou smear for cervical cancer screening  ? [place], [place] W, NP  ? 4 months ago Type 2 diabetes mellitus with hyperglycemia, with long-term current use of insulin ([place])  ? [place], [place] W, NP  ? 11 months ago Type 2 diabetes mellitus with hyperglycemia, with long-term current use of insulin ([place])  ? [place], RPH-CPP  ? 1 year ago Type 2 diabetes mellitus with hyperglycemia, with long-term current use of insulin ([place])  ? [place] Gildardo Pounds, NP  ?  ?  ?Future Appointments   ?        ? In 3 weeks Gildardo Pounds, NP [place]  ?  ? ?  ?  ?  ? ?

## 2021-10-07 ENCOUNTER — Other Ambulatory Visit: Payer: Self-pay

## 2021-10-08 ENCOUNTER — Encounter: Payer: Self-pay | Admitting: Hematology

## 2021-10-08 ENCOUNTER — Other Ambulatory Visit: Payer: Self-pay

## 2021-10-16 ENCOUNTER — Other Ambulatory Visit: Payer: Self-pay

## 2021-10-21 ENCOUNTER — Ambulatory Visit: Payer: Self-pay | Admitting: Nurse Practitioner

## 2021-10-26 ENCOUNTER — Ambulatory Visit: Payer: Self-pay | Admitting: Nurse Practitioner

## 2021-10-28 ENCOUNTER — Ambulatory Visit: Payer: Self-pay | Admitting: Gastroenterology

## 2021-10-28 NOTE — Progress Notes (Deleted)
Review of pertinent gastrointestinal problems: 1.  She was referred by [facility] gastroenterology in 2009 for a esophageal submucosal lesion.  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  She had no dysphagia.  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  Repeat EUS 03/2008 showed unchanged lesion, samples were again nondiagnostic, certainly no atypical cells.  Repeat endoscopic ultrasound 2016 at [facility] Dr. Delrae Alfred I do not have the exact report or the path report.  It looks like Dr. Delrae Alfred recommended repeat endoscopic ultrasound at 45-monthinterval

## 2021-10-30 ENCOUNTER — Other Ambulatory Visit: Payer: Self-pay

## 2021-10-30 ENCOUNTER — Other Ambulatory Visit: Payer: Self-pay | Admitting: Nurse Practitioner

## 2021-10-30 ENCOUNTER — Encounter: Payer: Self-pay | Admitting: Hematology

## 2021-10-30 NOTE — Telephone Encounter (Signed)
Filled for 90 day supply 22 days ago. ?Requested Prescriptions  ?Pending Prescriptions Disp Refills  ?? glipiZIDE (GLUCOTROL) 5 MG tablet 180 tablet 0  ?  Sig: Take 1 tablet (5 mg total) by mouth 2 (two) times daily before a meal.  ?  ? Endocrinology:  Diabetes - Sulfonylureas Passed - 10/30/2021  4:33 PM  ?  ?  Passed - HBA1C is between 0 and 7.9 and within 180 days  ?  Hemoglobin A1C  ?Date Value Ref Range Status  ?05/18/2021 7.9 (A) 4.0 - 5.6 % Final  ? ?HbA1c, POC (controlled diabetic range)  ?Date Value Ref Range Status  ?09/29/2020 9.2 (A) 0.0 - 7.0 % Final  ?   ?  ?  Passed - Cr in normal range and within 360 days  ?  Creatinine  ?Date Value Ref Range Status  ?07/08/2014 0.8 0.6 - 1.1 mg/dL Final  ? ?Creat  ?Date Value Ref Range Status  ?09/24/2019 0.81 0.50 - 1.05 mg/dL Final  ?  Comment:  ?  For patients >88 years of age, the reference limit ?for Creatinine is approximately 13% higher for people ?identified as African-American. ?. ?  ? ?Creatinine, Ser  ?Date Value Ref Range Status  ?05/18/2021 0.90 0.57 - 1.00 mg/dL Final  ?   ?  ?  Passed - Valid encounter within last 6 months  ?  Recent Outpatient Visits   ?      ? 4 months ago Encounter for Papanicolaou smear for cervical cancer screening  ? [place], [place] W, NP  ? 5 months ago Type 2 diabetes mellitus with hyperglycemia, with long-term current use of insulin ([place])  ? [place], [place] W, NP  ? 1 year ago Type 2 diabetes mellitus with hyperglycemia, with long-term current use of insulin ([place])  ? [place], RPH-CPP  ? 1 year ago Type 2 diabetes mellitus with hyperglycemia, with long-term current use of insulin ([place])  ? [place] Gildardo Pounds, NP  ?  ?  ?Future Appointments   ?        ? In 4 weeks Gildardo Pounds, NP [place]  ?  ? ?  ?  ?  ? ? ?

## 2021-11-02 ENCOUNTER — Encounter: Payer: Self-pay | Admitting: Hematology

## 2021-11-02 ENCOUNTER — Other Ambulatory Visit: Payer: Self-pay | Admitting: Nurse Practitioner

## 2021-11-02 ENCOUNTER — Other Ambulatory Visit: Payer: Self-pay

## 2021-11-02 MED ORDER — CYCLOBENZAPRINE HCL 10 MG PO TABS
10.0000 mg | ORAL_TABLET | Freq: Three times a day (TID) | ORAL | 0 refills | Status: DC | PRN
  Filled 2021-11-02 – 2021-11-10 (×2): qty 30, 10d supply, fill #0

## 2021-11-03 ENCOUNTER — Other Ambulatory Visit: Payer: Self-pay

## 2021-11-05 ENCOUNTER — Other Ambulatory Visit: Payer: Self-pay

## 2021-11-10 ENCOUNTER — Other Ambulatory Visit: Payer: Self-pay

## 2021-11-10 ENCOUNTER — Encounter: Payer: Self-pay | Admitting: Hematology

## 2021-11-24 ENCOUNTER — Other Ambulatory Visit (INDEPENDENT_AMBULATORY_CARE_PROVIDER_SITE_OTHER): Payer: Self-pay

## 2021-11-24 ENCOUNTER — Encounter: Payer: Self-pay | Admitting: Hematology

## 2021-11-24 ENCOUNTER — Encounter: Payer: Self-pay | Admitting: Gastroenterology

## 2021-11-24 ENCOUNTER — Other Ambulatory Visit: Payer: Self-pay

## 2021-11-24 ENCOUNTER — Ambulatory Visit (INDEPENDENT_AMBULATORY_CARE_PROVIDER_SITE_OTHER): Payer: Self-pay | Admitting: Gastroenterology

## 2021-11-24 VITALS — BP 118/64 | HR 63 | Ht 69.0 in | Wt 314.2 lb

## 2021-11-24 DIAGNOSIS — R109 Unspecified abdominal pain: Secondary | ICD-10-CM

## 2021-11-24 LAB — COMPREHENSIVE METABOLIC PANEL
ALT: 8 U/L (ref 0–35)
AST: 10 U/L (ref 0–37)
Albumin: 4.3 g/dL (ref 3.5–5.2)
Alkaline Phosphatase: 50 U/L (ref 39–117)
BUN: 20 mg/dL (ref 6–23)
CO2: 29 mEq/L (ref 19–32)
Calcium: 9.6 mg/dL (ref 8.4–10.5)
Chloride: 102 mEq/L (ref 96–112)
Creatinine, Ser: 1.01 mg/dL (ref 0.40–1.20)
GFR: 62.49 mL/min (ref >60.00)
Glucose, Bld: 226 mg/dL — ABNORMAL HIGH (ref 70–99)
Potassium: 4.1 mEq/L (ref 3.5–5.1)
Sodium: 139 mEq/L (ref 135–145)
Total Bilirubin: 0.2 mg/dL (ref 0.2–1.2)
Total Protein: 7.2 g/dL (ref 6.0–8.3)

## 2021-11-24 LAB — CBC
HCT: 34.7 % — ABNORMAL LOW (ref 36.0–46.0)
Hemoglobin: 11.2 g/dL — ABNORMAL LOW (ref 12.0–15.0)
MCHC: 32.4 g/dL (ref 30.0–36.0)
MCV: 85 fl (ref 78.0–100.0)
Platelets: 324 10*3/uL (ref 150.0–400.0)
RBC: 4.08 Mil/uL (ref 3.87–5.11)
RDW: 14.6 % (ref 11.5–15.5)
WBC: 7.1 10*3/uL (ref 4.0–10.5)

## 2021-11-24 MED ORDER — PREDNISONE 50 MG PO TABS
ORAL_TABLET | ORAL | 0 refills | Status: DC
  Filled 2021-11-24: qty 4, 1d supply, fill #0

## 2021-11-24 NOTE — Progress Notes (Signed)
?HPI: ?This is a pleasant 56 year old woman who was referred to me by Gildardo Pounds, NP  to evaluate myriad upper and lower GI symptoms.   ? ?I saw her twice about 14 years ago, referred by [facility] gastroenterologist. ? ?She was referred to me by [facility] gastroenterology Dr. Acquanetta Sit about 14 years ago for abnormal submucosal mass.  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  She was seen by chest surgeon Dr. Arlyce Dice shortly afterwards and he recommended surgery she called him back and canceled the appointment.  She return to see me for endoscopic ultrasound 03/2008 and the lesion had not grown at all.  I performed tunnel biopsies and biopsies showed no sign of neoplasia.  I have not heard from her since then. ? ?It looks like she established care with gastroenterologist Dr. Delrae Alfred several years ago.  I am not able to view those notes very completely. ? ?She is here today with a different set of issues:  Myriad upper and lower GI symptoms. ? ?CBC and complete metabolic profile November 2022 werenormal ? ?For at least 2 or 3 years she has had alternating bowel habits with constipation and then sometimes some loose stools.  She has a rising up sensation in the left side of her abdomen she will turn to the left and push downward and will sometimes improve.  She will pump.  She will sometimes feel forward short of breath.  She says her gas and her belching is undoubtedly.  She has a pain in her epigastrium that goes all the way through to her back.  She does not see blood in her stool.  Colon cancer does not run in her family.  She has nausea. ? ?She had a colonoscopy in 2016 through [facility] and does not remember what was found or what was recommended ? ?She had I believe also an upper endoscopy with ultrasound 2016 through [facility] and she does not remember what was found or what was  recommended ? ?Review of systems: ?Pertinent positive and negative review of systems were noted in the above HPI section. All other review negative. ? ? ?Past Medical History:  ?Diagnosis Date  ? Anemia 12/09/2011  ? child birth  ? Appendicitis, acute 12/03/2011  ? Chronic back pain 12/03/2011  ? Degenerative disc disease, cervical   ? low back area  ? Diabetes mellitus type 2, insulin dependent ([place]) 12/03/2011  ? GERD (gastroesophageal reflux disease)   ? H/O hiatal hernia   ? Hypertension 12/03/2011  ? Morbid obesity with BMI of 45.0-49.9, adult ([place]) 12/03/2011  ? Sleep apnea 12/03/2011  ? last sleep study May 2013  ? Thyroid disease   ? ? ?Past Surgical History:  ?Procedure Laterality Date  ? CESAREAN SECTION    ? LAPAROSCOPIC APPENDECTOMY  12/03/2011  ? Procedure: APPENDECTOMY LAPAROSCOPIC;  Surgeon: [name], DO;  Location: [facility];  Service: General;  Laterality: N/A;  drainage of intra-abdominal abscess   ? PARS PLANA VITRECTOMY Left 08/17/2012  ? Procedure: PARS PLANA VITRECTOMY WITH 25 GAUGE;  Surgeon: [name], MD;  Location: [facility];  Service: Ophthalmology;  Laterality: Left;  ? 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 Procedure: REPAIR OF COMPLEX TRACTION RETINAL DETACHMENT;  Surgeon: [name], MD;  Location: [facility];  Service: Ophthalmology;  Laterality: Left;  ? TUBAL LIGATION    ?  VITRECTOMY    ? ? ?Current Outpatient Medications  ?Medication Instructions  ? Cholecalciferol (VITAMIN D-3) 125 MCG (5000 UT) TABS 1 tablet, Oral, Daily  ? cyclobenzaprine (FLEXERIL) 10 MG tablet TAKE 1 TABLET (10 MG TOTAL) BY MOUTH 3 (THREE) TIMES DAILY AS NEEDED FOR MUSCLE SPASMS.  ? cyclobenzaprine (FLEXERIL) 10 mg, Oral, 3 times daily PRN  ? diclofenac Sodium (VOLTAREN) 1 % GEL APPLY 4 G TOPICALLY 4 (FOUR) TIMES DAILY.  ? fenofibrate 160 MG tablet TAKE 1 TABLET (160 MG TOTAL) BY MOUTH DAILY.  ? gabapentin (NEURONTIN) 300 MG capsule TAKE 1 CAPSULE (300 MG TOTAL) BY MOUTH AT BEDTIME.  ?  glipiZIDE (GLUCOTROL) 5 mg, Oral, 2 times daily before meals  ? hydrochlorothiazide (HYDRODIURIL) 25 MG tablet TAKE 1 TABLET (25 MG TOTAL) BY MOUTH DAILY.  ? Insulin Glargine (BASAGLAR KWIKPEN) 100 UNIT/ML Inject 80 units subcutaneous daily.  ? Insulin Pen Needle (B-D UF III MINI PEN NEEDLES) 31G X 5 MM MISC Use as instructed. Inject into the skin twice daily. NEEDS PASS  ? lisinopril (ZESTRIL) 40 mg, Oral, Daily, NEEDS PASS  ? methimazole (TAPAZOLE) 5 mg, Oral, 2 times daily  ? pantoprazole (PROTONIX) 40 mg, Oral, 2 times daily  ? Trulicity 3 mg, Injection, Weekly  ? vitamin C (ASCORBIC ACID) 1,000 mg, Daily  ? ? ?Allergies as of 11/24/2021 - Review Complete 11/24/2021  ?Allergen Reaction Noted  ? Latex Other (See Comments) 10/02/2014  ? Iohexol  06/17/2008  ? ? ?Family History  ?Problem Relation Age of Onset  ? Breast cancer Mother 60  ? Diabetes Mother   ? Hypertension Mother   ? Kidney failure Father   ? Heart disease Father   ?     No details  ? Diabetes Sister   ? Hypertension Sister   ? Hyperlipidemia Sister   ? Breast cancer Maternal Grandmother   ? Hypertension Maternal Grandmother   ? Kidney disease Maternal Grandfather   ? Cancer Maternal Grandfather   ?     type unknown  ? Diabetes Paternal Grandmother   ? Diabetes Paternal Grandfather   ? Diabetes Other   ? Cancer Other   ? Hypertension Other   ? Diabetes Paternal Aunt   ? Hypertension Paternal Aunt   ? Diabetes Maternal Uncle   ? Cancer Maternal Uncle   ?     type unknown  ? ? ?Social History  ? ?Socioeconomic History  ? Marital status: Divorced  ?  Spouse name: Not on file  ? Number of children: 2  ? Years of education: Not on file  ? Highest education level: Not on file  ?Occupational History  ? Not on file  ?Tobacco Use  ? Smoking status: Every Day  ?  Types: Cigars  ? Smokeless tobacco: Never  ?Vaping Use  ? Vaping Use: Never used  ?Substance and Sexual Activity  ? Alcohol use: Yes  ?  Alcohol/week: 1.0 standard drink  ?  Types: 1 Glasses of wine  per week  ? Drug use: No  ? Sexual activity: Yes  ?  Birth control/protection: Pill  ?Other Topics Concern  ? Not on file  ?Social History Narrative  ? Lives alone.   ? ?Social Determinants of Health  ? ?Financial Resource Strain: Not on file  ?Food Insecurity: Not on file  ?Transportation Needs: Not on file  ?Physical Activity: Not on file  ?Stress: Not on file  ?Social Connections: Not on file  ?Intimate Partner Violence: Not on file  ? ? ? ?  Physical Exam: ?Ht 5' 9" (1.753 m) Comment: height measured without shoes  Wt (!) 314 lb 4 oz (142.5 kg)   BMI 46.41 kg/m?  ?Constitutional: Morbidly obese ?Psychiatric: alert and oriented x3 ?Eyes: extraocular movements intact ?Mouth: oral pharynx moist, no lesions ?Neck: supple no lymphadenopathy ?Cardiovascular: heart regular rate and rhythm ?Lungs: clear to auscultation bilaterally ?Abdomen: soft, nontender, nondistended, no obvious ascites, no peritoneal signs, normal bowel sounds ?Extremities: no lower extremity edema bilaterally ?Skin: no lesions on visible extremities ? ? ?Assessment and plan: ?56 y.o. female with morbid obesity, myriad upper and lower GI symptoms including alternating constipation, loose stools, epigastric pain, rising up sensation in her left abdomen, pain that goes through to her back, belching, gassiness ? ?I explained to her that I am not sure what is going on but I recommended some testing.  I also recommended that we try to gather information from her 2016 colonoscopy and also her 2016 endoscopic ultrasound both at [facility].  She does not recall what was found on those examinations or what was recommended. ? ?I recommend that she try starting a fiber supplement as well as that can typically help alternating bowel patterns of which she has. ? ?I am going to start her work-up with labs including C BC, complete metabolic profile as well as a CT scan abdomen pelvis with IV and oral contrast.  She understands that if these are not helpful she  will probably need further testing which would very possibly include colonoscopy plus minus endoscopy or endoscopic ultrasound given her history of esophageal submucosal lesion. ? ? ?Please see the "Patient Instructions" secti

## 2021-11-24 NOTE — Patient Instructions (Signed)
If you are age 55 or younger, your body mass index should be between 19-25. Your Body mass index is 46.41 kg/m?[place] If this is out of the aformentioned range listed, please consider follow up with your Primary Care Provider.  ?________________________________________________________ ? ?The [facility] providers would like to encourage you to use [facility] to communicate with providers for non-urgent requests or questions.  Due to long hold times on the telephone, sending your provider a message by [facility] may be a faster and more efficient way to get a response.  Please allow 48 business hours for a response.  Please remember that this is for non-urgent requests.  ?_______________________________________________________ ? ?START: Metamucil fiber supplement daily. ? ?Your provider has requested that you go to the basement level for lab work before leaving today. Press "B" on the elevator. The lab is located at the first door on the left as you exit the elevator. ? ?Due to recent changes in healthcare laws, you may see the results of your imaging and laboratory studies on MyChart before your provider has had a chance to review them.  We understand that in some cases there may be results that are confusing or concerning to you. Not all laboratory results come back in the same time frame and the provider may be waiting for multiple results in order to interpret others.  Please give [place] 48 hours in order for your provider to thoroughly review all the results before contacting the office for clarification of your results.  ? ?Your records indicate that you have an allergy/sensitivity to one or more components within IV contrast dye. We have sent a prescription of Prednisone 50 mg (3 tablets) to your pharmacy as a pre-mediation for your contrasted procedure which should prevent any reaction from occurring. ? ?Take (1) 50 mg tablet of prednisone 13 hours prior to your procedure at 8:00pm.   ?Take (1) 50 mg tablet of prednisone 7  hours prior to your procedure at 2:00am.    ?Take (1) 50 mg tablet of prednisone and (1) 50 mg tablet of Benadryl 1 hour prior to your procedure at 8:00am.   ? ?You have been scheduled for a CT scan of the abdomen and pelvis at [facility], 1st floor Radiology. You are scheduled on 12-04-21  at 9:00am. You should arrive 15 minutes prior to your appointment time for registration.  The solution may taste better if refrigerated, but do NOT add ice or any other liquid to this solution. Shake well before drinking.  ? ?Please follow the written instructions below on the day of your exam:  ? ?1) Do not eat anything after 5:00am (4 hours prior to your test)  ? ?2) Drink 1 bottle of contrast @ 7:00am (2 hours prior to your exam)  Remember to shake well before drinking and do NOT pour over ice. ?    Drink 1 bottle of contrast @ 8:00am (1 hour prior to your exam)  ? ?You may take any medications as prescribed with a small amount of water, if necessary. If you take any of the following medications: METFORMIN, GLUCOPHAGE, GLUCOVANCE, AVANDAMET, RIOMET, FORTAMET, ACTOPLUS MET, JANUMET, [place] or METAGLIP, you MAY be asked to HOLD this medication 48 hours AFTER the exam.  ? ?The purpose of you drinking the oral contrast is to aid in the visualization of your intestinal tract. The contrast solution may cause some diarrhea. Depending on your individual set of symptoms, you may also receive an intravenous injection of x-ray contrast/dye. Plan  on being at [facility] for 45 minutes or longer, depending on the type of exam you are having performed.  ? ?If you have any questions regarding your exam or if you need to reschedule, you may call [facility] at [phone] between the hours of 8:00 am and 5:00 pm, Monday-Friday.  ? ?We will attempt to obtain medical records from [place] [place]. ? ?Thank you for entrusting me with your care and choosing [facility]. ? ?Dr Ardis Hughs ? ? ? ?

## 2021-11-27 ENCOUNTER — Other Ambulatory Visit: Payer: Self-pay

## 2021-11-27 ENCOUNTER — Ambulatory Visit: Payer: Self-pay | Attending: Nurse Practitioner | Admitting: Nurse Practitioner

## 2021-11-27 VITALS — BP 149/71 | HR 63 | Ht 70.0 in | Wt 309.4 lb

## 2021-11-27 DIAGNOSIS — E0590 Thyrotoxicosis, unspecified without thyrotoxic crisis or storm: Secondary | ICD-10-CM

## 2021-11-27 DIAGNOSIS — Z6841 Body Mass Index (BMI) 40.0 and over, adult: Secondary | ICD-10-CM

## 2021-11-27 DIAGNOSIS — E785 Hyperlipidemia, unspecified: Secondary | ICD-10-CM

## 2021-11-27 DIAGNOSIS — I10 Essential (primary) hypertension: Secondary | ICD-10-CM

## 2021-11-27 DIAGNOSIS — Z794 Long term (current) use of insulin: Secondary | ICD-10-CM

## 2021-11-27 DIAGNOSIS — E1165 Type 2 diabetes mellitus with hyperglycemia: Secondary | ICD-10-CM

## 2021-11-27 DIAGNOSIS — R7989 Other specified abnormal findings of blood chemistry: Secondary | ICD-10-CM

## 2021-11-27 DIAGNOSIS — E119 Type 2 diabetes mellitus without complications: Secondary | ICD-10-CM

## 2021-11-27 DIAGNOSIS — R109 Unspecified abdominal pain: Secondary | ICD-10-CM

## 2021-11-27 MED ORDER — METHIMAZOLE 5 MG PO TABS
5.0000 mg | ORAL_TABLET | Freq: Two times a day (BID) | ORAL | 2 refills | Status: DC
  Filled 2021-11-27 – 2021-12-10 (×2): qty 60, 30d supply, fill #0
  Filled 2022-01-10: qty 60, 30d supply, fill #1
  Filled 2022-02-09 – 2022-02-23 (×2): qty 60, 30d supply, fill #2

## 2021-11-27 MED ORDER — LISINOPRIL 40 MG PO TABS
40.0000 mg | ORAL_TABLET | Freq: Every day | ORAL | 1 refills | Status: DC
  Filled 2021-11-27: qty 90, 90d supply, fill #0
  Filled 2021-12-10: qty 30, 30d supply, fill #0
  Filled 2022-01-10: qty 30, 30d supply, fill #1
  Filled 2022-02-09 – 2022-02-23 (×2): qty 30, 30d supply, fill #2
  Filled 2022-03-25: qty 30, 30d supply, fill #3
  Filled 2022-04-21: qty 30, 30d supply, fill #4
  Filled 2022-06-02: qty 30, 30d supply, fill #5

## 2021-11-27 MED ORDER — FENOFIBRATE 160 MG PO TABS
ORAL_TABLET | Freq: Every day | ORAL | 3 refills | Status: DC
  Filled 2021-11-27: qty 90, fill #0
  Filled 2021-12-10: qty 30, 30d supply, fill #0
  Filled 2022-01-10: qty 30, 30d supply, fill #1
  Filled 2022-02-09 – 2022-02-23 (×2): qty 30, 30d supply, fill #2
  Filled 2022-03-25: qty 30, 30d supply, fill #3
  Filled 2022-04-21: qty 30, 30d supply, fill #4
  Filled 2022-06-02: qty 30, 30d supply, fill #5
  Filled 2022-06-27: qty 30, 30d supply, fill #6
  Filled 2022-07-26: qty 30, 30d supply, fill #7
  Filled 2022-08-19: qty 30, 30d supply, fill #8
  Filled 2022-09-21: qty 30, 30d supply, fill #9

## 2021-11-27 MED ORDER — HYDROCHLOROTHIAZIDE 25 MG PO TABS
25.0000 mg | ORAL_TABLET | Freq: Every day | ORAL | 1 refills | Status: DC
Start: 2021-11-27 — End: 2022-06-02
  Filled 2021-11-27: qty 90, 90d supply, fill #0
  Filled 2021-12-10: qty 30, 30d supply, fill #0
  Filled 2022-01-10: qty 30, 30d supply, fill #1
  Filled 2022-02-09 – 2022-02-23 (×2): qty 30, 30d supply, fill #2
  Filled 2022-03-25: qty 30, 30d supply, fill #3
  Filled 2022-04-21: qty 30, 30d supply, fill #4
  Filled 2022-06-02: qty 30, 30d supply, fill #5

## 2021-11-27 MED ORDER — GLIPIZIDE 5 MG PO TABS
5.0000 mg | ORAL_TABLET | Freq: Two times a day (BID) | ORAL | 1 refills | Status: DC
  Filled 2021-11-27: qty 180, 90d supply, fill #0
  Filled 2021-12-10: qty 60, 30d supply, fill #0
  Filled 2022-01-10: qty 60, 30d supply, fill #1
  Filled 2022-02-09 – 2022-02-23 (×2): qty 60, 30d supply, fill #2
  Filled 2022-03-25: qty 60, 30d supply, fill #3
  Filled 2022-04-21: qty 60, 30d supply, fill #4
  Filled 2022-06-02: qty 60, 30d supply, fill #5

## 2021-11-27 NOTE — Progress Notes (Signed)
Assessment & Plan:  Kristina Adams was seen today for diabetes.  Diagnoses and all orders for this visit:  Essential hypertension -     CMP14+EGFR -     hydrochlorothiazide (HYDRODIURIL) 25 MG tablet; TAKE 1 TABLET (25 MG TOTAL) BY MOUTH DAILY. -     lisinopril (ZESTRIL) 40 MG tablet; Take 1 tablet (40 mg total) by mouth daily. NEEDS PASS  Thyrotoxicosis without thyroid storm, unspecified thyrotoxicosis type -     methimazole (TAPAZOLE) 5 MG tablet; Take 1 tablet (5 mg total) by mouth 2 (two) times daily. -     Thyroid Panel With TSH Lab Results  Component Value Date   TSH 0.946 11/27/2021     Diabetes mellitus type 2, insulin dependent (HCC) -     Hemoglobin A1c -     glipiZIDE (GLUCOTROL) 5 MG tablet; Take 1 tablet (5 mg total) by mouth 2 (two) times daily before a meal.  Dyslipidemia, goal LDL below 70 -     Lipid panel -     fenofibrate 160 MG tablet; TAKE 1 TABLET (160 MG TOTAL) BY MOUTH DAILY. -     rosuvastatin (CRESTOR) 5 MG tablet; Take 1 tablet (5 mg total) by mouth daily.  Right flank pain -     Urinalysis, Complete -     Microscopic Examination  Abnormal CBC -     Iron, TIBC and Ferritin Panel  Type 2 diabetes mellitus with hyperglycemia, with long-term current use of insulin (HCC) Continue blood sugar control as discussed in office today, low carbohydrate diet, and regular physical exercise as tolerated, 150 minutes per week (30 min each day, 5 days per week, or 50 min 3 days per week). Keep blood sugar logs with fasting goal of 90-130 mg/dl, post prandial (after you eat) less than 180.  For Hypoglycemia: BS <60 and Hyperglycemia BS >400; contact the clinic ASAP. Annual eye exams and foot exams are recommended.   Morbid obesity with BMI of 45.0-49.9, adult ([place]) Discussed diet and exercise for person with BMI >25. Instructed: You must burn more calories than you eat. Losing 5 percent of your body weight should be considered a success. In the longer term, losing more  than 15 percent of your body weight and staying at this weight is an extremely good result. However, keep in mind that even losing 5 percent of your body weight leads to important health benefits, so try not to get discouraged if you're not able to lose more than this. Will recheck weight in 3-6 months.     Patient has been counseled on age-appropriate routine health concerns for screening and prevention. These are reviewed and up-to-date. Referrals have been placed accordingly. Immunizations are up-to-date or declined.    Subjective:   Chief Complaint  Patient presents with   Diabetes   HPI Kristina Adams 56 y.o. female presents to office today for follow up to completion of medical forms She has a past medical history of Anemia (12/09/2011), Anxiety, Appendicitis, acute (12/03/2011), Arthritis, Chronic back pain (12/03/2011), Degenerative disc disease, cervical, Diabetes mellitus type 2, insulin dependent (12/03/2011), GERD, H/O hiatal hernia, HLD, Hypertension (12/03/2011), Hyperthyroidism, IBS,  Morbid obesity with BMI of 45.0-49.9, adult ([place]) (12/03/2011), and Sleep apnea (12/03/2011).    HTN She does not have a blood pressure monitor at home. Blood pressure not at goal today. She is not consistently medication adherent. Currently prescribed HCTZ 25 mg daily, lisinopril 40 mg daily BP Readings from Last 3 Encounters:  11/27/21 Marland Kitchen)  149/71  11/24/21 118/64  06/24/21 (!) 161/79     DM 2 Poorly controlled. She is currently prescribed Trulicity 7.9GX weekly, glipizide 5 mg daily, and today Basaglar has been switched to 45 units in the am and 40 units in the pm from 80 units daily. .  She reports average fasting and postprandial readings: 90-150s. LDL not at goal with fenofibrate 160 mg daily. Will start low dose crestor 5 mg daily.  Lab Results  Component Value Date   HGBA1C 8.3 (H) 11/27/2021   Lab Results  Component Value Date   LDLCALC 98 11/27/2021     UTI Symptoms She  endorses right flank pain present for quite some time now.    Review of Systems  Constitutional:  Negative for fever, malaise/fatigue and weight loss.  HENT: Negative.  Negative for nosebleeds.   Eyes: Negative.  Negative for blurred vision, double vision and photophobia.  Respiratory: Negative.  Negative for cough and shortness of breath.   Cardiovascular: Negative.  Negative for chest pain, palpitations and leg swelling.  Gastrointestinal: Negative.  Negative for heartburn, nausea and vomiting.  Genitourinary:  Positive for flank pain.  Musculoskeletal:  Negative for myalgias.  Neurological: Negative.  Negative for dizziness, focal weakness, seizures and headaches.  Psychiatric/Behavioral: Negative.  Negative for suicidal ideas.    Past Medical History:  Diagnosis Date   Anemia 12/09/2011   child birth   Anxiety    Appendicitis, acute 12/03/2011   Arthritis    Chronic back pain 12/03/2011   Degenerative disc disease, cervical    low back area   Diabetes mellitus type 2, insulin dependent ([place]) 12/03/2011   GERD (gastroesophageal reflux disease)    H/O hiatal hernia    HLD (hyperlipidemia)    Hypertension 12/03/2011   Hyperthyroidism    IBS (irritable bowel syndrome)    Morbid obesity with BMI of 45.0-49.9, adult ([place]) 12/03/2011   Sleep apnea 12/03/2011   last sleep study May 2013    Past Surgical History:  Procedure Laterality Date   CESAREAN SECTION     x 2   LAPAROSCOPIC APPENDECTOMY  12/03/2011   Procedure: APPENDECTOMY LAPAROSCOPIC;  Surgeon: [name], DO;  Location: [facility];  Service: General;  Laterality: N/A;  drainage of intra-abdominal abscess    PARS PLANA VITRECTOMY Left 08/17/2012   Procedure: PARS PLANA VITRECTOMY WITH 25 GAUGE;  Surgeon: [name], MD;  Location: [facility];  Service: Ophthalmology;  Laterality: Left;   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   Procedure: REPAIR OF COMPLEX TRACTION RETINAL DETACHMENT;  Surgeon:  [name], MD;  Location: [facility];  Service: Ophthalmology;  Laterality: Left;   TUBAL LIGATION      Family History  Problem Relation Age of Onset   Breast cancer Mother 72   Diabetes Mother    Hypertension Mother    Kidney failure Father    Heart disease Father        No details   Diabetes Sister    Hypertension Sister    Hyperlipidemia Sister    Breast cancer Maternal Grandmother    Hypertension Maternal Grandmother    Kidney disease Maternal Grandfather    Cancer Maternal Grandfather        type unknown   Diabetes Paternal Grandmother    Diabetes Paternal Grandfather    Diabetes Daughter    Diabetes Maternal Uncle    Cancer Maternal Uncle        type unknown   Diabetes Paternal  Aunt    Hypertension Paternal Aunt    Breast cancer Paternal Aunt    Fibromyalgia Paternal Aunt     Social History Reviewed with no changes to be made today.   Outpatient Medications Prior to Visit  Medication Sig Dispense Refill   Cholecalciferol (VITAMIN D-3) 125 MCG (5000 UT) TABS Take 1 tablet by mouth daily. 90 tablet 3   cyclobenzaprine (FLEXERIL) 10 MG tablet TAKE 1 TABLET (10 MG TOTAL) BY MOUTH 3 (THREE) TIMES DAILY AS NEEDED FOR MUSCLE SPASMS. 30 tablet 0   Dulaglutide (TRULICITY) 3 DI/2.6EB SOPN Inject 3 mg as directed once a week. 2 mL 2   Insulin Pen Needle (B-D UF III MINI PEN NEEDLES) 31G X 5 MM MISC Use as instructed. Inject into the skin twice daily. NEEDS PASS 100 each 6   pantoprazole (PROTONIX) 40 MG tablet Take 1 tablet (40 mg total) by mouth 2 (two) times daily. 180 tablet 1   predniSONE (DELTASONE) 50 MG tablet Take 1 tablet 13 hours before procedure, 1 tablet 7 hours before procedure, and 1 tablet 1 hours before procedure. 4 tablet 0   vitamin C (ASCORBIC ACID) 500 MG tablet Take 1,000 mg by mouth daily.     cyclobenzaprine (FLEXERIL) 10 MG tablet Take 1 tablet (10 mg total) by mouth 3 (three) times daily as needed for muscle spasms. 30 tablet 1   fenofibrate 160 MG  tablet TAKE 1 TABLET (160 MG TOTAL) BY MOUTH DAILY. 90 tablet 0   glipiZIDE (GLUCOTROL) 5 MG tablet Take 1 tablet (5 mg total) by mouth 2 (two) times daily before a meal. 180 tablet 0   hydrochlorothiazide (HYDRODIURIL) 25 MG tablet TAKE 1 TABLET (25 MG TOTAL) BY MOUTH DAILY. 90 tablet 0   Insulin Glargine (BASAGLAR KWIKPEN) 100 UNIT/ML Inject 80 units subcutaneous daily. 72 mL 3   lisinopril (ZESTRIL) 40 MG tablet Take 1 tablet (40 mg total) by mouth daily. NEEDS PASS 90 tablet 1   methimazole (TAPAZOLE) 5 MG tablet Take 1 tablet (5 mg total) by mouth 2 (two) times daily. 60 tablet 1   diclofenac Sodium (VOLTAREN) 1 % GEL APPLY 4 G TOPICALLY 4 (FOUR) TIMES DAILY. (Patient taking differently: Apply 4 g topically as needed.) 350 g 6   gabapentin (NEURONTIN) 300 MG capsule TAKE 1 CAPSULE (300 MG TOTAL) BY MOUTH AT BEDTIME. 90 capsule 1   No facility-administered medications prior to visit.    Allergies  Allergen Reactions   Latex Other (See Comments)    Infection in left middle finger from latex gloves due to moisture.   Iohexol      Code: RASH, Desc: PATIENT CALLED 06/17/08-STATED AFTER 6 HOURS POST IV CONTRAST, HANDS,SCALP AND GROIN AREA DEVELOPED ITCHING,BURNING SENSATION, Onset Date: [id number]        Objective:    BP (!) 149/71   Pulse 63   Ht 5' 10"  (1.778 m)   Wt (!) 309 lb 6.4 oz (140.3 kg)   SpO2 99%   BMI 44.39 kg/m  Wt Readings from Last 3 Encounters:  11/27/21 (!) 309 lb 6.4 oz (140.3 kg)  11/24/21 (!) 314 lb 4 oz (142.5 kg)  06/24/21 (!) 312 lb (141.5 kg)    Physical Exam Vitals and nursing note reviewed.  Constitutional:      Appearance: She is well-developed.  HENT:     Head: Normocephalic and atraumatic.  Cardiovascular:     Rate and Rhythm: Normal rate and regular rhythm.     Heart sounds: Normal heart  sounds. No murmur heard.   No friction rub. No gallop.  Pulmonary:     Effort: Pulmonary effort is normal. No tachypnea or respiratory distress.     Breath  sounds: Normal breath sounds. No decreased breath sounds, wheezing, rhonchi or rales.  Chest:     Chest wall: No tenderness.  Abdominal:     General: Bowel sounds are normal.     Palpations: Abdomen is soft.  Musculoskeletal:        General: Normal range of motion.     Cervical back: Normal range of motion.  Skin:    General: Skin is warm and dry.  Neurological:     Mental Status: She is alert and oriented to person, place, and time.     Coordination: Coordination normal.  Psychiatric:        Behavior: Behavior normal. Behavior is cooperative.        Thought Content: Thought content normal.        Judgment: Judgment normal.         Patient has been counseled extensively about nutrition and exercise as well as the importance of adherence with medications and regular follow-up. The patient was given clear instructions to go to ER or return to medical center if symptoms don't improve, worsen or new problems develop. The patient verbalized understanding.   Follow-up: Return in about 3 months (around 02/27/2022).   Gildardo Pounds, FNP-BC [facility] [place], [place]   12/04/2021, 10:14 PM

## 2021-11-28 ENCOUNTER — Other Ambulatory Visit: Payer: Self-pay | Admitting: Nurse Practitioner

## 2021-11-28 ENCOUNTER — Encounter: Payer: Self-pay | Admitting: Nurse Practitioner

## 2021-11-28 DIAGNOSIS — D508 Other iron deficiency anemias: Secondary | ICD-10-CM

## 2021-11-28 DIAGNOSIS — Z794 Long term (current) use of insulin: Secondary | ICD-10-CM

## 2021-11-28 DIAGNOSIS — E1165 Type 2 diabetes mellitus with hyperglycemia: Secondary | ICD-10-CM

## 2021-11-28 LAB — CMP14+EGFR
ALT: 11 IU/L (ref 0–32)
AST: 10 IU/L (ref 0–40)
Albumin/Globulin Ratio: 1.6 (ref 1.2–2.2)
Albumin: 4.4 g/dL (ref 3.8–4.9)
Alkaline Phosphatase: 55 IU/L (ref 44–121)
BUN/Creatinine Ratio: 14 (ref 9–23)
BUN: 14 mg/dL (ref 6–24)
Bilirubin Total: 0.2 mg/dL (ref 0.0–1.2)
CO2: 25 mmol/L (ref 20–29)
Calcium: 9.7 mg/dL (ref 8.7–10.2)
Chloride: 101 mmol/L (ref 96–106)
Creatinine, Ser: 1.01 mg/dL — ABNORMAL HIGH (ref 0.57–1.00)
Globulin, Total: 2.7 g/dL (ref 1.5–4.5)
Glucose: 108 mg/dL — ABNORMAL HIGH (ref 70–99)
Potassium: 4.5 mmol/L (ref 3.5–5.2)
Sodium: 143 mmol/L (ref 134–144)
Total Protein: 7.1 g/dL (ref 6.0–8.5)
eGFR: 66 mL/min/{1.73_m2} (ref >59)

## 2021-11-28 LAB — URINALYSIS, COMPLETE
Bilirubin, UA: NEGATIVE
Glucose, UA: NEGATIVE
Ketones, UA: NEGATIVE
Nitrite, UA: POSITIVE — AB
Protein,UA: NEGATIVE
RBC, UA: NEGATIVE
Specific Gravity, UA: 1.014 (ref 1.005–1.030)
Urobilinogen, Ur: 0.2 mg/dL (ref 0.2–1.0)
pH, UA: 5.5 (ref 5.0–7.5)

## 2021-11-28 LAB — MICROSCOPIC EXAMINATION
Casts: NONE SEEN /lpf
RBC, Urine: NONE SEEN /hpf (ref 0–2)

## 2021-11-28 LAB — LIPID PANEL
Chol/HDL Ratio: 3.4 ratio (ref 0.0–4.4)
Cholesterol, Total: 166 mg/dL (ref 100–199)
HDL: 49 mg/dL (ref >39)
LDL Chol Calc (NIH): 98 mg/dL (ref 0–99)
Triglycerides: 105 mg/dL (ref 0–149)
VLDL Cholesterol Cal: 19 mg/dL (ref 5–40)

## 2021-11-28 LAB — HEMOGLOBIN A1C
Est. average glucose Bld gHb Est-mCnc: 192 mg/dL
Hgb A1c MFr Bld: 8.3 % — ABNORMAL HIGH (ref 4.8–5.6)

## 2021-11-28 LAB — THYROID PANEL WITH TSH
Free Thyroxine Index: 1.9 (ref 1.2–4.9)
T3 Uptake Ratio: 26 % (ref 24–39)
T4, Total: 7.3 ug/dL (ref 4.5–12.0)
TSH: 0.946 u[IU]/mL (ref 0.450–4.500)

## 2021-11-28 LAB — IRON,TIBC AND FERRITIN PANEL
Ferritin: 262 ng/mL — ABNORMAL HIGH (ref 15–150)
Iron Saturation: 9 % — CL (ref 15–55)
Iron: 28 ug/dL (ref 27–159)
Total Iron Binding Capacity: 324 ug/dL (ref 250–450)
UIBC: 296 ug/dL (ref 131–425)

## 2021-11-28 MED ORDER — BASAGLAR KWIKPEN 100 UNIT/ML ~~LOC~~ SOPN
PEN_INJECTOR | SUBCUTANEOUS | 3 refills | Status: DC
  Filled 2021-11-28: qty 24, 28d supply, fill #0
  Filled 2021-12-23 – 2021-12-30 (×2): qty 24, 28d supply, fill #1
  Filled 2022-01-10: qty 30, 35d supply, fill #2
  Filled 2022-02-09 – 2022-02-23 (×2): qty 30, 35d supply, fill #3
  Filled 2022-03-25: qty 30, 35d supply, fill #4
  Filled 2022-04-21: qty 24, 28d supply, fill #5
  Filled 2022-06-02: qty 30, 38d supply, fill #6
  Filled 2022-06-27 – 2022-07-26 (×3): qty 24, 28d supply, fill #7
  Filled 2022-08-19: qty 24, 28d supply, fill #8
  Filled 2022-09-21: qty 24, 28d supply, fill #9

## 2021-11-28 MED ORDER — FERROUS GLUCONATE 324 (38 FE) MG PO TABS
324.0000 mg | ORAL_TABLET | Freq: Two times a day (BID) | ORAL | 3 refills | Status: DC
  Filled 2021-11-28 – 2021-12-10 (×2): qty 180, 90d supply, fill #0

## 2021-11-30 ENCOUNTER — Other Ambulatory Visit: Payer: Self-pay | Admitting: Pharmacist

## 2021-11-30 ENCOUNTER — Encounter: Payer: Self-pay | Admitting: Hematology

## 2021-11-30 ENCOUNTER — Other Ambulatory Visit: Payer: Self-pay

## 2021-11-30 ENCOUNTER — Other Ambulatory Visit: Payer: Self-pay | Admitting: Nurse Practitioner

## 2021-11-30 DIAGNOSIS — N3000 Acute cystitis without hematuria: Secondary | ICD-10-CM

## 2021-11-30 MED ORDER — NITROFURANTOIN MONOHYD MACRO 100 MG PO CAPS
100.0000 mg | ORAL_CAPSULE | Freq: Two times a day (BID) | ORAL | 0 refills | Status: AC
  Filled 2021-11-30: qty 10, 5d supply, fill #0

## 2021-12-02 ENCOUNTER — Other Ambulatory Visit: Payer: Self-pay

## 2021-12-02 DIAGNOSIS — Z1231 Encounter for screening mammogram for malignant neoplasm of breast: Secondary | ICD-10-CM

## 2021-12-02 NOTE — Chronic Care Management (AMB) (Signed)
Patient  outreached by Leitha Schuller, PharmD Candidate on 11/30/21 to discuss hypertension.   Patient does not have an automated home blood pressure machine, though reports their grandmother has an old one. Discussed bringing by the pharmacy for accuracy testing.   Medication review was performed. They are taking medications as prescribed.   The following barriers to adherence were noted: - Denies concerns with medication access or understanding.  The following interventions were completed:  - Medications were reviewed - Patient was educated on medications, including indication and administration - Patient was educated on proper technique to check home blood pressure and reminded to bring home machine and readings to next provider appointment - Patient was educated on how to access home blood pressure machine - Patient was counseled on lifestyle modifications to improve blood pressure - praised for reducing fasting food intake and increasing meal prepping.   The patient has follow up scheduled:  PCP: 03/01/22  Contacted patient to offer to schedule sooner appointment for follow up with embedded pharmacist. She declines at this time. Reports that she had chinese a few days the week before she saw Zelda in office, and believes that related to increases in her blood pressure.   Encouraged patient to consider to have blood pressure checked at the pharmacy when next there.   Catie Hedwig Morton, PharmD, [facility] [phone]

## 2021-12-04 ENCOUNTER — Ambulatory Visit: Payer: Self-pay

## 2021-12-04 ENCOUNTER — Ambulatory Visit (HOSPITAL_COMMUNITY)
Admission: RE | Admit: 2021-12-04 | Discharge: 2021-12-04 | Disposition: A | Discharge status: Home / Self Care | Payer: Self-pay | Source: Ambulatory Visit | Attending: Gastroenterology | Admitting: Gastroenterology

## 2021-12-04 ENCOUNTER — Encounter: Payer: Self-pay | Admitting: Nurse Practitioner

## 2021-12-04 DIAGNOSIS — R109 Unspecified abdominal pain: Secondary | ICD-10-CM | POA: Insufficient documentation

## 2021-12-04 MED ORDER — SODIUM CHLORIDE (PF) 0.9 % IJ SOLN
INTRAMUSCULAR | Status: AC
  Filled 2021-12-04: qty 50

## 2021-12-04 MED ORDER — IOHEXOL 300 MG/ML  SOLN
100.0000 mL | Freq: Once | INTRAMUSCULAR | Status: AC | PRN
  Administered 2021-12-04: 100 mL via INTRAVENOUS

## 2021-12-04 MED ORDER — ROSUVASTATIN CALCIUM 5 MG PO TABS
5.0000 mg | ORAL_TABLET | Freq: Every day | ORAL | 3 refills | Status: DC
  Filled 2021-12-04: qty 90, 90d supply, fill #0
  Filled 2022-03-25: qty 30, 30d supply, fill #1
  Filled 2022-04-21: qty 30, 30d supply, fill #2
  Filled 2022-06-02: qty 30, 30d supply, fill #3
  Filled 2022-06-27: qty 30, 30d supply, fill #4
  Filled 2022-07-26: qty 30, 30d supply, fill #5
  Filled 2022-08-19: qty 30, 30d supply, fill #6
  Filled 2022-09-21: qty 30, 30d supply, fill #7

## 2021-12-04 NOTE — Telephone Encounter (Signed)
Noted  

## 2021-12-04 NOTE — Telephone Encounter (Signed)
  Chief Complaint: High blood sugar Symptoms: thirsty Frequency: Since today Pertinent Negatives: Patient denies Dizziness Disposition: '[]'$ ED /'[x]'$ Urgent Care (no appt availability in office) / '[]'$ Appointment(In office/virtual)/ '[]'$  [facility]/ '[]'$ Home Care/ '[]'$ Refused Recommended Disposition /'[]'$ [place] Mobile [facility]/ '[]'$  Follow-up with PCP Additional Notes: PT reports blood sugar readings in the high 300's to low 400's. Pt believes this is from prednisone taking earlier for testing. Pt will go to UC for care.    Summary: bs is in mid-200 upper 400   Pt stated had CT scan and had to take prednisone; stated bs is in the mid-200s to upper 400s. Pt stated her mouth has been dry for a few days now and she has been urinating a lot more than normal since yesterday.    Pt is unsure of what she should do.    Pt seeking clinical advice.      Reason for Disposition  [1] Blood glucose > 300 mg/dL (16.7 mmol/L) AND [2] two or more times in a row  Blood glucose > 400 mg/dL (22.2 mmol/L)  Answer Assessment - Initial Assessment Questions 1. BLOOD GLUCOSE: "What is your blood glucose level?"      2:37 428, 2:39 397 2. ONSET: "When did you check the blood glucose?"     2:39 3. USUAL RANGE: "What is your glucose level usually?" (e.g., usual fasting morning value, usual evening value)     138 4. KETONES: "Do you check for ketones (urine or blood test strips)?" If yes, ask: "What does the test show now?"      no 5. TYPE 1 or 2:  "Do you know what type of diabetes you have?"  (e.g., Type 1, Type 2, Gestational; doesn't know)      Type 2 6. INSULIN: "Do you take insulin?" "What type of insulin(s) do you use? What is the mode of delivery? (syringe, pen; injection or pump)?"      Insulin, glipizide 7. DIABETES PILLS: "Do you take any pills for your diabetes?" If yes, ask: "Have you missed taking any pills recently?"     yes 8. OTHER SYMPTOMS: "Do you have any symptoms?" (e.g., fever, frequent  urination, difficulty breathing, dizziness, weakness, vomiting)     Thirsty , frequent urination 9. PREGNANCY: "Is there any chance you are pregnant?" "When was your last menstrual period?"     na  Protocols used: Diabetes - High Blood Sugar-A-AH

## 2021-12-07 ENCOUNTER — Encounter: Payer: Self-pay | Admitting: Gastroenterology

## 2021-12-08 ENCOUNTER — Encounter: Payer: Self-pay | Admitting: Hematology

## 2021-12-08 ENCOUNTER — Other Ambulatory Visit: Payer: Self-pay

## 2021-12-10 ENCOUNTER — Other Ambulatory Visit: Payer: Self-pay

## 2021-12-10 ENCOUNTER — Encounter: Payer: Self-pay | Admitting: Hematology

## 2021-12-10 DIAGNOSIS — R222 Localized swelling, mass and lump, trunk: Secondary | ICD-10-CM

## 2021-12-17 ENCOUNTER — Telehealth: Payer: Self-pay

## 2021-12-17 ENCOUNTER — Other Ambulatory Visit: Payer: Self-pay

## 2021-12-17 DIAGNOSIS — R222 Localized swelling, mass and lump, trunk: Secondary | ICD-10-CM

## 2021-12-17 NOTE — Telephone Encounter (Signed)
I have entered the new order and sent to the schedulers.

## 2021-12-17 NOTE — Telephone Encounter (Signed)
Change to chest CT without contrast however this will not provide as much information as a contrasted study.

## 2021-12-17 NOTE — Telephone Encounter (Signed)
-----   Message from Riley Lam sent at 12/17/2021 11:03 AM EDT ----- Patient stated she is allergic to contrast but does not want to take any prednisone .  Please call patient to discuss  Thanks Tonya  ----- Message ----- From: Timothy Lasso, RN Sent: 12/17/2021  10:38 AM EDT To: April H Pait; Roosvelt Maser; Riley Lam  Please set up chest CT see order

## 2021-12-17 NOTE — Telephone Encounter (Signed)
Dr Fuller Plan Dr Ardis Hughs ordered a CT Chest with contrast for this pt.  She is saying the she is allergic to contrast and does not agree to pre med.  Can we order without?  Thank you

## 2021-12-23 ENCOUNTER — Other Ambulatory Visit: Payer: Self-pay

## 2021-12-23 ENCOUNTER — Encounter: Payer: Self-pay | Admitting: Hematology

## 2021-12-23 ENCOUNTER — Encounter: Payer: Self-pay | Admitting: Nurse Practitioner

## 2021-12-30 ENCOUNTER — Other Ambulatory Visit: Payer: Self-pay | Admitting: Pharmacist

## 2021-12-30 ENCOUNTER — Other Ambulatory Visit: Payer: Self-pay

## 2021-12-30 ENCOUNTER — Encounter: Payer: Self-pay | Admitting: Hematology

## 2021-12-30 NOTE — Chronic Care Management (AMB) (Signed)
Patient seen by Tanja Port, PharmD Candidate on 12/30/21 while they were picking up prescriptions at [facility].   Patient does not have an automated home blood pressure machine  Medication review was performed. They are taking medications as prescribed.   The following barriers to adherence were noted: - Needing Refills - picked up today  The following interventions were completed:  - Medications were reviewed - Patient was educated on medications, including indication and administration  The patient has follow up scheduled:  PCP: 03/01/22   Catie Hedwig Morton, PharmD, [place] [phone]

## 2022-01-10 ENCOUNTER — Other Ambulatory Visit: Payer: Self-pay | Admitting: Nurse Practitioner

## 2022-01-10 DIAGNOSIS — E1165 Type 2 diabetes mellitus with hyperglycemia: Secondary | ICD-10-CM

## 2022-01-10 DIAGNOSIS — K219 Gastro-esophageal reflux disease without esophagitis: Secondary | ICD-10-CM

## 2022-01-11 ENCOUNTER — Other Ambulatory Visit: Payer: Self-pay

## 2022-01-11 ENCOUNTER — Encounter: Payer: Self-pay | Admitting: Hematology

## 2022-01-11 MED ORDER — PANTOPRAZOLE SODIUM 40 MG PO TBEC
40.0000 mg | DELAYED_RELEASE_TABLET | Freq: Two times a day (BID) | ORAL | 2 refills | Status: DC
  Filled 2022-01-11: qty 60, 30d supply, fill #0
  Filled 2022-02-09 – 2022-02-23 (×2): qty 60, 30d supply, fill #1
  Filled 2022-03-25: qty 60, 30d supply, fill #2

## 2022-01-11 MED ORDER — TRULICITY 3 MG/0.5ML ~~LOC~~ SOAJ
3.0000 mg | SUBCUTANEOUS | 2 refills | Status: DC
  Filled 2022-01-11: qty 2, 28d supply, fill #0
  Filled 2022-02-09 – 2022-02-23 (×2): qty 2, 28d supply, fill #1
  Filled 2022-03-25: qty 2, 28d supply, fill #2

## 2022-01-13 ENCOUNTER — Ambulatory Visit (HOSPITAL_COMMUNITY): Payer: Self-pay

## 2022-01-14 ENCOUNTER — Ambulatory Visit: Payer: Self-pay

## 2022-01-14 NOTE — Telephone Encounter (Signed)
  Chief Complaint: UTI Symptoms: urinary frequency, flank pain, dysuria, fatigue, dry mouth, and increased BS Frequency: 4 days  Pertinent Negatives: NA Disposition: '[]'$ ED /'[]'$ Urgent Care (no appt availability in office) / '[]'$ Appointment(In office/virtual)/ '[]'$  [facility]/ '[]'$ Home Care/ '[x]'$ Refused Recommended Disposition /'[]'$ [place] Mobile [facility]/ '[x]'$  Follow-up with PCP Additional Notes: pt called, said she is unsure if she has UTI but wanted to see Zelda since she has to come to pharmacy and pu other prescriptions. I advised her of no appts and offered Virtual UC and UC at [facility] but pt doesn't want to do those options. She is asking can she come give a urine sample and then based on that Zelda just treat her without having an appt.I advised her I would message provider and someone could fu with her but didn't know if she would get a response today or not. Pt said she is ok if she can come in the morning as well if unable to come today.   Summary: [facility]vate blood sugar advice   Pt is calling to report that have blood sugars have been running in the 200-300 ranging. With frequency urinating. Pt believe she has a UTI. Please advise      Reason for Disposition  Urinating more frequently than usual (i.e., frequency)  Answer Assessment - Initial Assessment Questions 1. SYMPTOM: "What's the main symptom you're concerned about?" (e.g., frequency, incontinence)     frequency 2. ONSET: "When did the  sx  start?"     4 days 4. CAUSE: "What do you think is causing the symptoms?"     Possible UTI 5. OTHER SYMPTOMS: "Do you have any other symptoms?" (e.g., fever, flank pain, blood in urine, pain with urination)     Flank pain, dysuria, fatigue, dry mouth  Protocols used: Urinary Symptoms-A-AH

## 2022-01-15 ENCOUNTER — Encounter: Payer: Self-pay | Admitting: Nurse Practitioner

## 2022-01-15 ENCOUNTER — Encounter: Payer: Self-pay | Admitting: Hematology

## 2022-01-15 ENCOUNTER — Telehealth (HOSPITAL_BASED_OUTPATIENT_CLINIC_OR_DEPARTMENT_OTHER): Payer: Self-pay | Admitting: Nurse Practitioner

## 2022-01-15 ENCOUNTER — Other Ambulatory Visit: Payer: Self-pay

## 2022-01-15 DIAGNOSIS — N3000 Acute cystitis without hematuria: Secondary | ICD-10-CM

## 2022-01-15 MED ORDER — SULFAMETHOXAZOLE-TRIMETHOPRIM 800-160 MG PO TABS
1.0000 | ORAL_TABLET | Freq: Two times a day (BID) | ORAL | 0 refills | Status: AC
  Filled 2022-01-15: qty 28, 14d supply, fill #0

## 2022-01-15 NOTE — Progress Notes (Signed)
Virtual Visit Note  I discussed the limitations, risks, security and privacy concerns of performing an evaluation and management service by video and the availability of in person appointments. I also discussed with the patient that there may be a patient responsible charge related to this service. The patient expressed understanding and agreed to proceed.    I connected with Kristina Adams on 01/15/22  at   8:10 AM EDT  EDT by VIDEO and verified that I am speaking with the correct person using two identifiers.   Location of Patient: Private Residence   Location of Provider: [place] and [place] participating in VIRTUAL visit: Kristina Rankins FNP-BC [facility] RIEDE    History of Present Illness: VIRTUAL visit for: UTI SYMPTOMS  Onset 4 days ago, decreased appetite, urinary frequency, feeling of incomplete bladder emptying, dysuria, overall not feeling well and elevated blood glucose levels.  She was treated for UTI in May with Macrobid and states symptoms improved at that time but did not completely resolve.    Past Medical History:  Diagnosis Date   Anemia 12/09/2011   child birth   Anxiety    Appendicitis, acute 12/03/2011   Arthritis    Chronic back pain 12/03/2011   Degenerative disc disease, cervical    low back area   Diabetes mellitus type 2, insulin dependent ([place]) 12/03/2011   GERD (gastroesophageal reflux disease)    H/O hiatal hernia    HLD (hyperlipidemia)    Hypertension 12/03/2011   Hyperthyroidism    IBS (irritable bowel syndrome)    Morbid obesity with BMI of 45.0-49.9, adult ([place]) 12/03/2011   Sleep apnea 12/03/2011   last sleep study May 2013    Past Surgical History:  Procedure Laterality Date   CESAREAN SECTION     x 2   LAPAROSCOPIC APPENDECTOMY  12/03/2011   Procedure: APPENDECTOMY LAPAROSCOPIC;  Surgeon: [name], DO;  [facility];  Service: General;  Laterality: N/A;  drainage of intra-abdominal abscess     PARS PLANA VITRECTOMY Left 08/17/2012   Procedure: PARS PLANA VITRECTOMY WITH 25 GAUGE;  Surgeon: [name], MD;  Location: [facility];  Service: Ophthalmology;  Laterality: Left;   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   Procedure: REPAIR OF COMPLEX TRACTION RETINAL DETACHMENT;  Surgeon: [name], MD;  Location: [facility];  Service: Ophthalmology;  Laterality: Left;   TUBAL LIGATION      Family History  Problem Relation Age of Onset   Breast cancer Mother 57   Diabetes Mother    Hypertension Mother    Kidney failure Father    Heart disease Father        No details   Diabetes Sister    Hypertension Sister    Hyperlipidemia Sister    Breast cancer Maternal Grandmother    Hypertension Maternal Grandmother    Kidney disease Maternal Grandfather    Cancer Maternal Grandfather        type unknown   Diabetes Paternal Grandmother    Diabetes Paternal Grandfather    Diabetes Daughter    Diabetes Maternal Uncle    Cancer Maternal Uncle        type unknown   Diabetes Paternal Aunt    Hypertension Paternal Aunt    Breast cancer Paternal Aunt    Fibromyalgia Paternal Aunt     Social History   Socioeconomic History   Marital status: Divorced    Spouse name: Not on file  Number of children: 2   Years of education: Not on file   Highest education level: Not on file  Occupational History   Not on file  Tobacco Use   Smoking status: Every Day    Types: Cigars   Smokeless tobacco: Never  Vaping Use   Vaping Use: Never used  Substance and Sexual Activity   Alcohol use: Yes    Alcohol/week: 1.0 standard drink of alcohol    Types: 1 Glasses of wine per week    Comment: occasional   Drug use: No   Sexual activity: Yes    Birth control/protection: Pill  Other Topics Concern   Not on file  Social History Narrative   Lives alone.    Social Determinants of Health   Financial Resource Strain: Not on file  Food Insecurity: Not on file   Transportation Needs: Not on file  Physical Activity: Not on file  Stress: Not on file  Social Connections: Not on file     Observations/Objective: Awake, alert and oriented x 3   Review of Systems  Constitutional:  Negative for fever, malaise/fatigue and weight loss.  HENT: Negative.  Negative for nosebleeds.   Eyes: Negative.  Negative for blurred vision, double vision and photophobia.  Respiratory: Negative.  Negative for cough and shortness of breath.   Cardiovascular: Negative.  Negative for chest pain, palpitations and leg swelling.  Gastrointestinal: Negative.  Negative for heartburn, nausea and vomiting.  Genitourinary:  Positive for dysuria, flank pain, frequency and urgency. Negative for hematuria.  Musculoskeletal:  Negative for myalgias.  Neurological: Negative.  Negative for dizziness, focal weakness, seizures and headaches.  Psychiatric/Behavioral: Negative.  Negative for suicidal ideas.     Assessment and Plan: Diagnoses and all orders for this visit:  Acute cystitis without hematuria -     sulfamethoxazole-trimethoprim (BACTRIM DS) 800-160 MG tablet; Take 1 tablet by mouth 2 (two) times daily for 14 days.     Follow Up Instructions Return if symptoms worsen or fail to improve.     I discussed the assessment and treatment plan with the patient. The patient was provided an opportunity to ask questions and all were answered. The patient agreed with the plan and demonstrated an understanding of the instructions.   The patient was advised to call back or seek an in-person evaluation if the symptoms worsen or if the condition fails to improve as anticipated.  I provided 11 minutes of face-to-face time during this encounter including median intraservice time, reviewing previous notes, labs, imaging, medications and explaining diagnosis and management.  Gildardo Pounds, FNP-BC

## 2022-01-20 NOTE — Addendum Note (Signed)
Addended by: Demetrius Revel on: 01/20/2022 03:49 PM   Modules accepted: Orders

## 2022-01-21 ENCOUNTER — Ambulatory Visit: Payer: Self-pay

## 2022-02-04 ENCOUNTER — Ambulatory Visit: Payer: Self-pay

## 2022-02-04 ENCOUNTER — Encounter: Payer: Self-pay | Admitting: Hematology

## 2022-02-04 ENCOUNTER — Ambulatory Visit
Admission: RE | Admit: 2022-02-04 | Discharge: 2022-02-04 | Disposition: A | Discharge status: Home / Self Care | Payer: Commercial Managed Care - HMO | Source: Ambulatory Visit | Attending: Nurse Practitioner | Admitting: Nurse Practitioner

## 2022-02-04 DIAGNOSIS — Z1231 Encounter for screening mammogram for malignant neoplasm of breast: Secondary | ICD-10-CM

## 2022-02-08 ENCOUNTER — Encounter: Payer: Self-pay | Admitting: Gastroenterology

## 2022-02-09 ENCOUNTER — Encounter: Payer: Self-pay | Admitting: Hematology

## 2022-02-09 ENCOUNTER — Other Ambulatory Visit: Payer: Self-pay

## 2022-02-15 ENCOUNTER — Other Ambulatory Visit: Payer: Self-pay

## 2022-02-16 ENCOUNTER — Other Ambulatory Visit: Payer: Self-pay

## 2022-02-16 ENCOUNTER — Encounter: Payer: Self-pay | Admitting: Thoracic Surgery (Cardiothoracic Vascular Surgery)

## 2022-02-23 ENCOUNTER — Other Ambulatory Visit: Payer: Self-pay

## 2022-02-23 ENCOUNTER — Encounter: Payer: Self-pay | Admitting: Thoracic Surgery (Cardiothoracic Vascular Surgery)

## 2022-02-23 ENCOUNTER — Encounter: Payer: Self-pay | Admitting: Hematology

## 2022-03-01 ENCOUNTER — Other Ambulatory Visit: Payer: Self-pay

## 2022-03-01 ENCOUNTER — Ambulatory Visit: Payer: Commercial Managed Care - HMO | Attending: Nurse Practitioner | Admitting: Nurse Practitioner

## 2022-03-01 ENCOUNTER — Encounter: Payer: Self-pay | Admitting: Nurse Practitioner

## 2022-03-01 VITALS — BP 136/87 | HR 87 | Temp 98.0°F | Ht 70.0 in | Wt 306.0 lb

## 2022-03-01 DIAGNOSIS — Z23 Encounter for immunization: Secondary | ICD-10-CM

## 2022-03-01 DIAGNOSIS — E1165 Type 2 diabetes mellitus with hyperglycemia: Secondary | ICD-10-CM

## 2022-03-01 DIAGNOSIS — D649 Anemia, unspecified: Secondary | ICD-10-CM

## 2022-03-01 DIAGNOSIS — R109 Unspecified abdominal pain: Secondary | ICD-10-CM | POA: Diagnosis not present

## 2022-03-01 DIAGNOSIS — E785 Hyperlipidemia, unspecified: Secondary | ICD-10-CM | POA: Diagnosis not present

## 2022-03-01 DIAGNOSIS — Z794 Long term (current) use of insulin: Secondary | ICD-10-CM

## 2022-03-01 DIAGNOSIS — I10 Essential (primary) hypertension: Secondary | ICD-10-CM | POA: Diagnosis not present

## 2022-03-01 DIAGNOSIS — E0590 Thyrotoxicosis, unspecified without thyrotoxic crisis or storm: Secondary | ICD-10-CM

## 2022-03-01 LAB — POCT GLYCOSYLATED HEMOGLOBIN (HGB A1C): HbA1c POC (<> result, manual entry): 8.7 % (ref 4.0–5.6)

## 2022-03-01 LAB — POCT URINALYSIS DIP (CLINITEK)
Bilirubin, UA: NEGATIVE
Blood, UA: NEGATIVE
Glucose, UA: NEGATIVE mg/dL
Ketones, POC UA: NEGATIVE mg/dL
Leukocytes, UA: NEGATIVE
Nitrite, UA: NEGATIVE
POC PROTEIN,UA: NEGATIVE
Spec Grav, UA: 1.015 (ref 1.010–1.025)
Urobilinogen, UA: 0.2 E.U./dL
pH, UA: 6 (ref 5.0–8.0)

## 2022-03-01 MED ORDER — METHIMAZOLE 5 MG PO TABS
5.0000 mg | ORAL_TABLET | Freq: Two times a day (BID) | ORAL | 2 refills | Status: DC
  Filled 2022-03-01 – 2022-03-25 (×2): qty 60, 30d supply, fill #0
  Filled 2022-04-21: qty 60, 30d supply, fill #1
  Filled 2022-05-24: qty 60, 30d supply, fill #2

## 2022-03-01 NOTE — Progress Notes (Signed)
Assessment & Plan:  Kristina Adams was seen today for diabetes.  Diagnoses and all orders for this visit:  Type 2 diabetes mellitus with hyperglycemia, with long-term current use of insulin (HCC) -     POCT glycosylated hemoglobin (Hb A1C) -     CMP14+EGFR -     Ambulatory referral to Ophthalmology  Essential hypertension -     CMP14+EGFR Continue all antihypertensives as prescribed.  Reminded to bring in blood pressure log for follow  up appointment.  RECOMMENDATIONS: DASH/Mediterranean Diets are healthier choices for HTN.    Dyslipidemia, goal LDL below 70 -     Lipid panel  Anemia, unspecified type -     CBC with Differential  Need for Tdap vaccination -     Tdap vaccine greater than or equal to 7yo IM  Left flank pain -     POCT URINALYSIS DIP (CLINITEK)    Patient has been counseled on age-appropriate routine health concerns for screening and prevention. These are reviewed and up-to-date. Referrals have been placed accordingly. Immunizations are up-to-date or declined.    Subjective:   Chief Complaint  Patient presents with   Diabetes    Kristina Adams 56 y.o. female presents to office today for follow up to HTN, DM, HPL  She has a past medical history of Anemia (12/09/2011), Anxiety, Appendicitis, acute (12/03/2011), Arthritis, Chronic back pain (12/03/2011), Degenerative disc disease, cervical, Diabetes mellitus type 2, insulin dependent (12/03/2011), GERD, H/O hiatal hernia, HLD, Hypertension (12/03/2011), Hyperthyroidism, IBS,  Morbid obesity with BMI of 45.0-49.9, adult (12/03/2011), and Sleep apnea (12/03/2011).    Currently being followed by GI for lower abdomen pain with alternating constipation and loose stools.  CT of abdomen and pelvis showed mass in chest that was present for at least 14 years. Now increased in size. Referred to cardiothoracic surgery. CT chest currently pending.     HTN She does not have a blood pressure monitor at home. Blood pressure not  quite at goal today. She is not consistently dietary adherent (eating potato chips as snacks). Currently prescribed HCTZ 25 mg daily, lisinopril 40 mg daily BP Readings from Last 3 Encounters:  03/01/22 136/87  11/27/21 (!) 149/71  11/24/21 118/64    DM 2 Poorly controlled. She is currently prescribed Trulicity 3.5DI weekly, glipizide 5 mg daily, and Basaglar 45 units in the am and 40 units in the pm from 80 units daily.. LDL not at goal with fenofibrate 160 mg daily. She was recently started on low dose crestor 5 mg daily. She is not diet or exercise adherent.  Lab Results  Component Value Date   HGBA1C 8.7 03/01/2022    Lab Results  Component Value Date   HGBA1C 8.3 (H) 11/27/2021       Review of Systems  Constitutional:  Negative for fever, malaise/fatigue and weight loss.  HENT: Negative.  Negative for nosebleeds.   Eyes: Negative.  Negative for blurred vision, double vision and photophobia.  Respiratory: Negative.  Negative for cough and shortness of breath.   Cardiovascular: Negative.  Negative for chest pain, palpitations and leg swelling.  Gastrointestinal:  Positive for abdominal pain and constipation. Negative for blood in stool, heartburn, melena, nausea and vomiting.  Genitourinary:  Positive for flank pain. Negative for dysuria, frequency, hematuria and urgency.  Musculoskeletal:  Negative for myalgias.  Neurological: Negative.  Negative for dizziness, focal weakness, seizures and headaches.  Psychiatric/Behavioral: Negative.  Negative for suicidal ideas.     Past Medical History:  Diagnosis Date  Anemia 12/09/2011   child birth   Anxiety    Appendicitis, acute 12/03/2011   Arthritis    Chronic back pain 12/03/2011   Degenerative disc disease, cervical    low back area   Diabetes mellitus type 2, insulin dependent ([place]) 12/03/2011   GERD (gastroesophageal reflux disease)    H/O hiatal hernia    HLD (hyperlipidemia)    Hypertension 12/03/2011    Hyperthyroidism    IBS (irritable bowel syndrome)    Morbid obesity with BMI of 45.0-49.9, adult ([place]) 12/03/2011   Sleep apnea 12/03/2011   last sleep study May 2013    Past Surgical History:  Procedure Laterality Date   CESAREAN SECTION     x 2   LAPAROSCOPIC APPENDECTOMY  12/03/2011   Procedure: APPENDECTOMY LAPAROSCOPIC;  Surgeon: [name], DO;  Location: [facility];  Service: General;  Laterality: N/A;  drainage of intra-abdominal abscess    PARS PLANA VITRECTOMY Left 08/17/2012   Procedure: PARS PLANA VITRECTOMY WITH 25 GAUGE;  Surgeon: [name], MD;  Location: [place];  Service: Ophthalmology;  Laterality: Left;   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   Procedure: REPAIR OF COMPLEX TRACTION RETINAL DETACHMENT;  Surgeon: [name], MD;  Location: [facility];  Service: Ophthalmology;  Laterality: Left;   TUBAL LIGATION      Family History  Problem Relation Age of Onset   Breast cancer Mother 32   Diabetes Mother    Hypertension Mother    Kidney failure Father    Heart disease Father        No details   Diabetes Sister    Hypertension Sister    Hyperlipidemia Sister    Breast cancer Maternal Grandmother    Hypertension Maternal Grandmother    Kidney disease Maternal Grandfather    Cancer Maternal Grandfather        type unknown   Diabetes Paternal Grandmother    Diabetes Paternal Grandfather    Diabetes Daughter    Diabetes Maternal Uncle    Cancer Maternal Uncle        type unknown   Diabetes Paternal Aunt    Hypertension Paternal Aunt    Breast cancer Paternal Aunt    Fibromyalgia Paternal Aunt     Social History Reviewed with no changes to be made today.   Outpatient Medications Prior to Visit  Medication Sig Dispense Refill   Cholecalciferol (VITAMIN D-3) 125 MCG (5000 UT) TABS Take 1 tablet by mouth daily. 90 tablet 3   cyclobenzaprine (FLEXERIL) 10 MG tablet TAKE 1 TABLET (10 MG TOTAL) BY MOUTH 3 (THREE) TIMES DAILY AS  NEEDED FOR MUSCLE SPASMS. 30 tablet 0   Dulaglutide (TRULICITY) 3 WE/3.1VQ SOPN Inject 3 mg as directed once a week. 2 mL 2   fenofibrate 160 MG tablet TAKE 1 TABLET (160 MG TOTAL) BY MOUTH DAILY. 90 tablet 3   glipiZIDE (GLUCOTROL) 5 MG tablet Take 1 tablet (5 mg total) by mouth 2 (two) times daily before a meal. 180 tablet 1   hydrochlorothiazide (HYDRODIURIL) 25 MG tablet TAKE 1 TABLET (25 MG TOTAL) BY MOUTH DAILY. 90 tablet 1   Insulin Glargine (BASAGLAR KWIKPEN) 100 UNIT/ML Inject into the skin 45 units in the am and 40 units in the pm 78 mL 3   Insulin Pen Needle (B-D UF III MINI PEN NEEDLES) 31G X 5 MM MISC Use as instructed. Inject into the skin twice daily. NEEDS PASS 100 each 6   lisinopril (ZESTRIL) 40  MG tablet Take 1 tablet (40 mg total) by mouth daily. NEEDS PASS 90 tablet 1   methimazole (TAPAZOLE) 5 MG tablet Take 1 tablet (5 mg total) by mouth 2 (two) times daily. 60 tablet 2   rosuvastatin (CRESTOR) 5 MG tablet Take 1 tablet (5 mg total) by mouth daily. 90 tablet 3   vitamin C (ASCORBIC ACID) 500 MG tablet Take 1,000 mg by mouth daily.     diclofenac Sodium (VOLTAREN) 1 % GEL APPLY 4 G TOPICALLY 4 (FOUR) TIMES DAILY. (Patient taking differently: Apply 4 g topically as needed.) 350 g 6   ferrous gluconate (FERGON) 324 MG tablet Take 1 tablet (324 mg total) by mouth 2 (two) times daily with a meal. 180 tablet 3   gabapentin (NEURONTIN) 300 MG capsule TAKE 1 CAPSULE (300 MG TOTAL) BY MOUTH AT BEDTIME. 90 capsule 1   pantoprazole (PROTONIX) 40 MG tablet Take 1 tablet (40 mg total) by mouth 2 (two) times daily. (Patient not taking: Reported on 03/01/2022) 60 tablet 2   predniSONE (DELTASONE) 50 MG tablet Take 1 tablet 13 hours before procedure, 1 tablet 7 hours before procedure, and 1 tablet 1 hours before procedure. 4 tablet 0   No facility-administered medications prior to visit.    Allergies  Allergen Reactions   Latex Other (See Comments)    Infection in left middle finger from  latex gloves due to moisture.   Iohexol      Code: RASH, Desc: PATIENT CALLED 06/17/08-STATED AFTER 6 HOURS POST IV CONTRAST, HANDS,SCALP AND GROIN AREA DEVELOPED ITCHING,BURNING SENSATION, Onset Date: [id number]        Objective:    BP 136/87   Pulse 87   Temp 98 F (36.7 C) (Oral)   Ht _0  (1.778 m)   Wt (!) 306 lb (138.8 kg)   SpO2 98%   BMI 43.91 kg/m  Wt Readings from Last 3 Encounters:  03/01/22 (!) 306 lb (138.8 kg)  11/27/21 (!) 309 lb 6.4 oz (140.3 kg)  11/24/21 (!) 314 lb 4 oz (142.5 kg)    Physical Exam Vitals and nursing note reviewed.  Constitutional:      Appearance: She is well-developed.  HENT:     Head: Normocephalic and atraumatic.  Cardiovascular:     Rate and Rhythm: Normal rate and regular rhythm.     Heart sounds: Normal heart sounds. No murmur heard.    No friction rub. No gallop.  Pulmonary:     Effort: Pulmonary effort is normal. No tachypnea or respiratory distress.     Breath sounds: Normal breath sounds. No decreased breath sounds, wheezing, rhonchi or rales.  Chest:     Chest wall: No tenderness.  Abdominal:     General: Bowel sounds are normal.     Palpations: Abdomen is soft.  Musculoskeletal:        General: Normal range of motion.     Cervical back: Normal range of motion.  Skin:    General: Skin is warm and dry.  Neurological:     Mental Status: She is alert and oriented to person, place, and time.     Coordination: Coordination normal.  Psychiatric:        Behavior: Behavior normal. Behavior is cooperative.        Thought Content: Thought content normal.        Judgment: Judgment normal.          Patient has been counseled extensively about nutrition and exercise as well as the importance  of adherence with medications and regular follow-up. The patient was given clear instructions to go to ER or return to medical center if symptoms don't improve, worsen or new problems develop. The patient verbalized understanding.    Follow-up: Return in about 3 months (around 06/01/2022) for HTN/HPL/DM.   Gildardo Pounds, FNP-BC [facility] and [place], [place]   03/01/2022, 2:47 PM

## 2022-03-02 LAB — CBC WITH DIFFERENTIAL/PLATELET
Basophils Absolute: 0.1 10*3/uL (ref 0.0–0.2)
Basos: 1 %
EOS (ABSOLUTE): 0.1 10*3/uL (ref 0.0–0.4)
Eos: 1 %
Hematocrit: 34.7 % (ref 34.0–46.6)
Hemoglobin: 11.1 g/dL (ref 11.1–15.9)
Immature Grans (Abs): 0 10*3/uL (ref 0.0–0.1)
Immature Granulocytes: 0 %
Lymphocytes Absolute: 1.4 10*3/uL (ref 0.7–3.1)
Lymphs: 22 %
MCH: 27.5 pg (ref 26.6–33.0)
MCHC: 32 g/dL (ref 31.5–35.7)
MCV: 86 fL (ref 79–97)
Monocytes Absolute: 0.4 10*3/uL (ref 0.1–0.9)
Monocytes: 6 %
Neutrophils Absolute: 4.5 10*3/uL (ref 1.4–7.0)
Neutrophils: 70 %
Platelets: 333 10*3/uL (ref 150–450)
RBC: 4.03 x10E6/uL (ref 3.77–5.28)
RDW: 13.5 % (ref 11.7–15.4)
WBC: 6.5 10*3/uL (ref 3.4–10.8)

## 2022-03-02 LAB — CMP14+EGFR
ALT: 9 IU/L (ref 0–32)
AST: 13 IU/L (ref 0–40)
Albumin/Globulin Ratio: 1.5 (ref 1.2–2.2)
Albumin: 4.4 g/dL (ref 3.8–4.9)
Alkaline Phosphatase: 67 IU/L (ref 44–121)
BUN/Creatinine Ratio: 18 (ref 9–23)
BUN: 19 mg/dL (ref 6–24)
Bilirubin Total: 0.3 mg/dL (ref 0.0–1.2)
CO2: 25 mmol/L (ref 20–29)
Calcium: 9.5 mg/dL (ref 8.7–10.2)
Chloride: 101 mmol/L (ref 96–106)
Creatinine, Ser: 1.05 mg/dL — ABNORMAL HIGH (ref 0.57–1.00)
Globulin, Total: 2.9 g/dL (ref 1.5–4.5)
Glucose: 216 mg/dL — ABNORMAL HIGH (ref 70–99)
Potassium: 4.2 mmol/L (ref 3.5–5.2)
Sodium: 139 mmol/L (ref 134–144)
Total Protein: 7.3 g/dL (ref 6.0–8.5)
eGFR: 62 mL/min/{1.73_m2} (ref >59)

## 2022-03-02 LAB — LIPID PANEL
Chol/HDL Ratio: 2.7 ratio (ref 0.0–4.4)
Cholesterol, Total: 139 mg/dL (ref 100–199)
HDL: 52 mg/dL (ref >39)
LDL Chol Calc (NIH): 68 mg/dL (ref 0–99)
Triglycerides: 103 mg/dL (ref 0–149)
VLDL Cholesterol Cal: 19 mg/dL (ref 5–40)

## 2022-03-04 ENCOUNTER — Encounter (INDEPENDENT_AMBULATORY_CARE_PROVIDER_SITE_OTHER): Payer: Self-pay

## 2022-03-05 ENCOUNTER — Ambulatory Visit
Admission: RE | Admit: 2022-03-05 | Discharge: 2022-03-05 | Disposition: A | Discharge status: Home / Self Care | Payer: Commercial Managed Care - HMO | Source: Ambulatory Visit | Attending: Gastroenterology | Admitting: Gastroenterology

## 2022-03-05 DIAGNOSIS — R222 Localized swelling, mass and lump, trunk: Secondary | ICD-10-CM

## 2022-03-10 ENCOUNTER — Other Ambulatory Visit: Payer: Self-pay

## 2022-03-16 ENCOUNTER — Encounter: Payer: Self-pay | Admitting: Gastroenterology

## 2022-03-17 ENCOUNTER — Encounter: Payer: Self-pay | Admitting: Gastroenterology

## 2022-03-17 NOTE — Progress Notes (Signed)
Documentation of outside records that were obtained they will be scanned into the chart  November 2016 upper EUS A 1.5 cm subepithelial lesion was seen in the distal esophagus 2 cm proximal to the Z-line.  The overlying mucosa appeared normal.  Multiple bite on bite biopsies were obtained for histology. The mass measured 10.8 mm x 15.3 mm Shape of the mass was oval. Echotexture of the mass was hypoechoic and homogenous. The mass appears to arise from the deep mucosal layer EUS layer to. An intact muscularis was noted along the entire lesion. No adjacent lymphadenopathy was found. The scope was then completely withdrawn from the patient and the procedure terminated. Possible differentials include leiomyoma, GIST, granular cell tumor  November 2016 upper EUS pathology Distal esophagus biopsy fragments of benign squamous mucosa  Based on this report it suggested that repeat examination was to be considered in 6 months  Unfortunately the colonoscopy results were not able to be found on these records.  As I review Dr. Ardis Hughs notes as well it looks like in 2010 he noted this particular lesion around the same particular size on his EUS examination.  It has not been 7 years since that has been evaluated.  I think the major issue remains her being evaluated by thoracic surgery.  With this being said that she has had symptoms of alteration of her bowel habits and with his history of prior esophageal submucosal lesion, I do think it is reasonable for [place] to consider doing an upper endoscopy with endoscopic ultrasound as well as colonoscopy to help try to understand those symptoms for her if she wishes.  Patty, Please let the patient know that I have reviewed the records on Dr. Eugenia Pancoast behalf. If she is willing to move forward with endoscopic evaluation of the symptoms she talked with Dr. Ardis Hughs about as she is also awaiting the thoracic surgery consultation next month I am willing to.  She can be  offered an EGD/EUS plus colonoscopy slot next available with me. If she wants to wait until the clinic visit with thoracic surgery that is okay as well. Let me know what she decides.   Justice Britain, MD [facility] Advanced [facility]oscopy Office # [phone]

## 2022-03-17 NOTE — Progress Notes (Signed)
I was able to call and speak with the patient this afternoon. We have gone over her CT chest results once again as well as the further work-up that I think is reasonable and has been outlined in my previous note. I have heard from interventional radiology as well as endocrine surgery as well as thoracic surgery and we all agree that further biopsy is reasonable and we will ask our interventional colleagues to see if they can help with that before she sees Dr. Roxan Hockey in the coming weeks  Here is the plan of action that she agrees to:  1) proceed with interventional radiology referral for attempt at biopsy of the inferior aspect of the mediastinal mass/lesion/possible goiter 2) proceed with scheduling upper EUS with colonoscopy for further evaluation of her GI symptoms and previous distal esophageal subepithelial lesion that had been noted by Dr. Ardis Hughs and Dr. Delrae Alfred years ago for work-up of abdominal pain, changes in bowel habits, colon cancer screening, abnormal endoscopy, esophageal nodule-this can be pursued my next available EUS/colonoscopy slot 3) follow-up with Dr. Roxan Hockey for upcoming clinic visit in October   Justice Britain, MD [facility] Advanced [facility]oscopy Office # [phone]

## 2022-03-18 ENCOUNTER — Other Ambulatory Visit: Payer: Self-pay

## 2022-03-18 DIAGNOSIS — R194 Change in bowel habit: Secondary | ICD-10-CM

## 2022-03-18 DIAGNOSIS — R222 Localized swelling, mass and lump, trunk: Secondary | ICD-10-CM

## 2022-03-18 DIAGNOSIS — K229 Disease of esophagus, unspecified: Secondary | ICD-10-CM

## 2022-03-18 DIAGNOSIS — R109 Unspecified abdominal pain: Secondary | ICD-10-CM

## 2022-03-18 NOTE — Progress Notes (Signed)
Error no voicemail left-voicemail not set up. Sent message to My Chart

## 2022-03-18 NOTE — Progress Notes (Signed)
EGD EUS COlON scheduled for 04/15/22 at 345 pm at [facility] with GM   IR referral has been made.   Left message on machine to call back

## 2022-03-19 ENCOUNTER — Encounter: Payer: Self-pay | Admitting: *Deleted

## 2022-03-19 ENCOUNTER — Other Ambulatory Visit (HOSPITAL_COMMUNITY): Payer: Self-pay | Admitting: Interventional Radiology

## 2022-03-19 ENCOUNTER — Other Ambulatory Visit: Payer: Self-pay | Admitting: Interventional Radiology

## 2022-03-19 ENCOUNTER — Telehealth: Payer: Commercial Managed Care - HMO | Admitting: Physician Assistant

## 2022-03-19 ENCOUNTER — Ambulatory Visit: Payer: Self-pay

## 2022-03-19 ENCOUNTER — Other Ambulatory Visit: Payer: Self-pay

## 2022-03-19 DIAGNOSIS — R9389 Abnormal findings on diagnostic imaging of other specified body structures: Secondary | ICD-10-CM

## 2022-03-19 DIAGNOSIS — U071 COVID-19: Secondary | ICD-10-CM | POA: Diagnosis not present

## 2022-03-19 MED ORDER — NIRMATRELVIR/RITONAVIR (PAXLOVID)TABLET
3.0000 | ORAL_TABLET | Freq: Two times a day (BID) | ORAL | 0 refills | Status: DC
  Filled 2022-03-19: qty 30, 5d supply, fill #0

## 2022-03-19 NOTE — Telephone Encounter (Signed)
Noted patient has visit today

## 2022-03-19 NOTE — Telephone Encounter (Signed)
  Chief Complaint: COVID Symptoms: body aches, sore throat, cough, BS elevated, SOB slightly when lying down  Frequency: tested positive today  Pertinent Negatives: NA Disposition: '[]'$ ED /'[]'$ Urgent Care (no appt availability in office) / '[]'$ Appointment(In office/virtual)/ '[x]'$  [facility]/ '[]'$ Home Care/ '[]'$ Refused Recommended Disposition /'[]'$ [facility]/ '[]'$  Follow-up with PCP Additional Notes: advised pt of no appts available but suggested doing virtual UC appt. She agreed with that and scheduled appt for 1330. Pt has been treating symptoms with just natural home remedies so far.   Summary: High Blood Sugar   Patient took a home COVID test today and its positive. Patient experiencing stuffy nose, sore throat, body ache from head to toe, blood sugar is ranging 253-331, last night blood sugar was 315.      Reason for Disposition  MILD difficulty breathing (e.g., minimal/no SOB at rest, SOB with walking, pulse <100)  Answer Assessment - Initial Assessment Questions 1. COVID-19 DIAGNOSIS: "How do you know that you have COVID?" (e.g., positive lab test or self-test, diagnosed by doctor or NP/PA, symptoms after exposure).     Home test  3. ONSET: "When did the COVID-19 symptoms start?"      Today  4. WORST SYMPTOM: "What is your worst symptom?" (e.g., cough, fever, shortness of breath, muscle aches)     Body aches 5. COUGH: "Do you have a cough?" If Yes, ask: "How bad is the cough?"       Yes slightly  7. RESPIRATORY STATUS: "Describe your breathing?" (e.g., normal; shortness of breath, wheezing, unable to speak)      Slight SOB when lying down  9. OTHER SYMPTOMS: "Do you have any other symptoms?"  (e.g., chills, fatigue, headache, loss of smell or taste, muscle pain, sore throat)     Body aches, runny nose, sore throat, BS elevated, decreased appetite  10. HIGH RISK DISEASE: "Do you have any chronic medical problems?" (e.g., asthma, heart or lung disease, weak immune system,  obesity, etc.)       DM 11. VACCINE: "Have you had the COVID-19 vaccine?" If Yes, ask: "Which one, how many shots, when did you get it?"       1 vaccine 1 booster in 2021  Protocols used: Coronavirus (COVID-19) Diagnosed or Suspected-A-AH

## 2022-03-19 NOTE — Progress Notes (Signed)
Virtual Visit Consent   Kristina Adams, you are scheduled for a virtual visit with a [facility] provider today. Just as with appointments in the office, your consent must be obtained to participate. Your consent will be active for this visit and any virtual visit you may have with one of our providers in the next 365 days. If you have a MyChart account, a copy of this consent can be sent to you electronically.  As this is a virtual visit, video technology does not allow for your provider to perform a traditional examination. This may limit your provider's ability to fully assess your condition. If your provider identifies any concerns that need to be evaluated in person or the need to arrange testing (such as labs, EKG, etc.), we will make arrangements to do so. Although advances in technology are sophisticated, we cannot ensure that it will always work on either your end or our end. If the connection with a video visit is poor, the visit may have to be switched to a telephone visit. With either a video or telephone visit, we are not always able to ensure that we have a secure connection.  By engaging in this virtual visit, you consent to the provision of healthcare and authorize for your insurance to be billed (if applicable) for the services provided during this visit. Depending on your insurance coverage, you may receive a charge related to this service.  I need to obtain your verbal consent now. Are you willing to proceed with your visit today? KRISTENE LIBERATI has provided verbal consent on 03/19/2022 for a virtual visit (video or telephone). Mar Daring, PA-C  Date: 03/19/2022 1:43 PM  Virtual Visit via Video Note   I, Mar Daring, connected with  Kristina Adams  ([id number], [birth year]) on 03/19/22 at  1:30 PM EDT by a video-enabled telemedicine application and verified that I am speaking with the correct person using two identifiers.  Location: Patient: Virtual Visit Location  Patient: Home Provider: Virtual Visit Location Provider: Home Office   I discussed the limitations of evaluation and management by telemedicine and the availability of in person appointments. The patient expressed understanding and agreed to proceed.    History of Present Illness: Kristina Adams is a 56 y.o. who identifies as a female who was assigned female at birth, and is being seen today for Covid 20.  HPI: URI  This is a new problem. Episode onset: Tested positive for Covid 19 this morning on at home test; symptoms started about 2 days ago. The problem has been gradually worsening. There has been no fever. Associated symptoms include congestion, headaches, neck pain, rhinorrhea, sinus pain, sneezing and a sore throat (just today). Pertinent negatives include no coughing, diarrhea, ear pain, nausea, plugged ear sensation or vomiting. Associated symptoms comments: Elevated glucose, myalgias, fatigue, chills. She has tried sleep for the symptoms. The treatment provided no relief.      Problems:  Patient Active Problem List   Diagnosis Date Noted   Abdominal pain 10/01/2019   Anemia 10/01/2019   Uterine leiomyoma 10/01/2019   Seasonal allergies 09/24/2019   Irritable bowel syndrome with both constipation and diarrhea 09/24/2019   Vitamin D deficiency 09/24/2019   Benign essential hypertension 11/05/2015   Gastroesophageal reflux disease 11/05/2015   Uncontrolled type 2 diabetes mellitus with diabetic polyneuropathy, with long-term current use of insulin 11/05/2015   Multinodular goiter 11/06/2013   Obesity, Class III, BMI 40-49.9 (morbid obesity) ([place]) 05/23/2013   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   Diabetic neuropathy ([place]) 12/13/2011   Iron deficiency anemia 12/09/2011   Sleep apnea 12/03/2011   Morbid obesity with BMI of 45.0-49.9, adult ([place]) 12/03/2011   Chronic back pain 12/03/2011   Hypertension 12/03/2011   Depression 12/03/2011   Appendicitis, acute 12/03/2011    Diabetes mellitus type 2, insulin dependent ([place]) 12/03/2011   DM (diabetes mellitus) ([place]) 10/06/2011   HYPOGLYCEMIA 08/22/2010   ALLERGIC RHINITIS CAUSE UNSPECIFIED 08/22/2010   Thyrotoxicosis 06/27/2010   DIABETES MELLITUS, TYPE II, UNCONTROLLED 06/27/2010   Hyperlipidemia 06/27/2010   MORBID OBESITY 06/27/2010   Essential hypertension 06/27/2010   PERS HX NONCOMPLIANCE W/MED TX PRS HAZARDS HLTH 06/27/2010   NONSPECIFIC ABN FINDING RAD & OTH EXAM GI TRACT 01/05/9484   NEOPLASM UNCERTAIN BHV OTH&UNSPEC DIGESTIVE ORGN 02/26/2008    Allergies:  Allergies  Allergen Reactions   Latex Other (See Comments)    Infection in left middle finger from latex gloves due to moisture.   Iohexol      Code: RASH, Desc: PATIENT CALLED 06/17/08-STATED AFTER 6 HOURS POST IV CONTRAST, HANDS,SCALP AND GROIN AREA DEVELOPED ITCHING,BURNING SENSATION, Onset Date: [id number]    Medications:  Current Outpatient Medications:    Cholecalciferol (VITAMIN D-3) 125 MCG (5000 UT) TABS, Take 1 tablet by mouth daily., Disp: 90 tablet, Rfl: 3   cyclobenzaprine (FLEXERIL) 10 MG tablet, TAKE 1 TABLET (10 MG TOTAL) BY MOUTH 3 (THREE) TIMES DAILY AS NEEDED FOR MUSCLE SPASMS., Disp: 30 tablet, Rfl: 0   diclofenac Sodium (VOLTAREN) 1 % GEL, APPLY 4 G TOPICALLY 4 (FOUR) TIMES DAILY. (Patient taking differently: Apply 4 g topically as needed.), Disp: 350 g, Rfl: 6   Dulaglutide (TRULICITY) 3 KX/3.8HW SOPN, Inject 3 mg as directed once a week., Disp: 2 mL, Rfl: 2   fenofibrate 160 MG tablet, TAKE 1 TABLET (160 MG TOTAL) BY MOUTH DAILY., Disp: 90 tablet, Rfl: 3   gabapentin (NEURONTIN) 300 MG capsule, TAKE 1 CAPSULE (300 MG TOTAL) BY MOUTH AT BEDTIME., Disp: 90 capsule, Rfl: 1   glipiZIDE (GLUCOTROL) 5 MG tablet, Take 1 tablet (5 mg total) by mouth 2 (two) times daily before a meal., Disp: 180 tablet, Rfl: 1   hydrochlorothiazide (HYDRODIURIL) 25 MG tablet, TAKE 1 TABLET (25 MG TOTAL) BY MOUTH DAILY., Disp: 90 tablet, Rfl: 1    Insulin Glargine (BASAGLAR KWIKPEN) 100 UNIT/ML, Inject into the skin 45 units in the am and 40 units in the pm, Disp: 78 mL, Rfl: 3   Insulin Pen Needle (B-D UF III MINI PEN NEEDLES) 31G X 5 MM MISC, Use as instructed. Inject into the skin twice daily. NEEDS PASS, Disp: 100 each, Rfl: 6   lisinopril (ZESTRIL) 40 MG tablet, Take 1 tablet (40 mg total) by mouth daily. NEEDS PASS, Disp: 90 tablet, Rfl: 1   methimazole (TAPAZOLE) 5 MG tablet, Take 1 tablet (5 mg total) by mouth 2 (two) times daily., Disp: 60 tablet, Rfl: 2   pantoprazole (PROTONIX) 40 MG tablet, Take 1 tablet (40 mg total) by mouth 2 (two) times daily. (Patient not taking: Reported on 03/01/2022), Disp: 60 tablet, Rfl: 2   rosuvastatin (CRESTOR) 5 MG tablet, Take 1 tablet (5 mg total) by mouth daily., Disp: 90 tablet, Rfl: 3   vitamin C (ASCORBIC ACID) 500 MG tablet, Take 1,000 mg by mouth daily., Disp: , Rfl:   Observations/Objective: Patient is well-developed, well-nourished in no acute distress.  Resting comfortably at home.  Head is normocephalic, atraumatic.  No labored breathing.  Speech is  clear and coherent with logical content.  Patient is alert and oriented at baseline.    Assessment and Plan: 1. COVID-19  - Continue OTC symptomatic management of choice - Will send OTC vitamins and supplement information through AVS - Patient is unable to get anti-viral before Monday, which will be out of the 5 day window for treatment; continue symptomatic management - Patient enrolled in MyChart symptom monitoring - Push fluids - Rest as needed - Discussed return precautions and when to seek in-person evaluation, sent via AVS as well   Follow Up Instructions: I discussed the assessment and treatment plan with the patient. The patient was provided an opportunity to ask questions and all were answered. The patient agreed with the plan and demonstrated an understanding of the instructions.  A copy of instructions were sent to the  patient via MyChart unless otherwise noted below.    The patient was advised to call back or seek an in-person evaluation if the symptoms worsen or if the condition fails to improve as anticipated.  Time:  I spent 15 minutes with the patient via telehealth technology discussing the above problems/concerns.    Mar Daring, PA-C

## 2022-03-19 NOTE — Patient Instructions (Signed)
Geroge Baseman, thank you for joining Mar Daring, PA-C for today's virtual visit.  While this provider is not your primary care provider (PCP), if your PCP is located in our provider database this encounter information will be shared with them immediately following your visit.  Consent: (Patient) Kristina Adams provided verbal consent for this virtual visit at the beginning of the encounter.  Current Medications:  Current Outpatient Medications:    Cholecalciferol (VITAMIN D-3) 125 MCG (5000 UT) TABS, Take 1 tablet by mouth daily., Disp: 90 tablet, Rfl: 3   cyclobenzaprine (FLEXERIL) 10 MG tablet, TAKE 1 TABLET (10 MG TOTAL) BY MOUTH 3 (THREE) TIMES DAILY AS NEEDED FOR MUSCLE SPASMS., Disp: 30 tablet, Rfl: 0   diclofenac Sodium (VOLTAREN) 1 % GEL, APPLY 4 G TOPICALLY 4 (FOUR) TIMES DAILY. (Patient taking differently: Apply 4 g topically as needed.), Disp: 350 g, Rfl: 6   Dulaglutide (TRULICITY) 3 CB/7.6EG SOPN, Inject 3 mg as directed once a week., Disp: 2 mL, Rfl: 2   fenofibrate 160 MG tablet, TAKE 1 TABLET (160 MG TOTAL) BY MOUTH DAILY., Disp: 90 tablet, Rfl: 3   gabapentin (NEURONTIN) 300 MG capsule, TAKE 1 CAPSULE (300 MG TOTAL) BY MOUTH AT BEDTIME., Disp: 90 capsule, Rfl: 1   glipiZIDE (GLUCOTROL) 5 MG tablet, Take 1 tablet (5 mg total) by mouth 2 (two) times daily before a meal., Disp: 180 tablet, Rfl: 1   hydrochlorothiazide (HYDRODIURIL) 25 MG tablet, TAKE 1 TABLET (25 MG TOTAL) BY MOUTH DAILY., Disp: 90 tablet, Rfl: 1   Insulin Glargine (BASAGLAR KWIKPEN) 100 UNIT/ML, Inject into the skin 45 units in the am and 40 units in the pm, Disp: 78 mL, Rfl: 3   Insulin Pen Needle (B-D UF III MINI PEN NEEDLES) 31G X 5 MM MISC, Use as instructed. Inject into the skin twice daily. NEEDS PASS, Disp: 100 each, Rfl: 6   lisinopril (ZESTRIL) 40 MG tablet, Take 1 tablet (40 mg total) by mouth daily. NEEDS PASS, Disp: 90 tablet, Rfl: 1   methimazole (TAPAZOLE) 5 MG tablet, Take 1 tablet (5 mg  total) by mouth 2 (two) times daily., Disp: 60 tablet, Rfl: 2   pantoprazole (PROTONIX) 40 MG tablet, Take 1 tablet (40 mg total) by mouth 2 (two) times daily. (Patient not taking: Reported on 03/01/2022), Disp: 60 tablet, Rfl: 2   rosuvastatin (CRESTOR) 5 MG tablet, Take 1 tablet (5 mg total) by mouth daily., Disp: 90 tablet, Rfl: 3   vitamin C (ASCORBIC ACID) 500 MG tablet, Take 1,000 mg by mouth daily., Disp: , Rfl:    Medications ordered in this encounter:  Meds ordered this encounter  Medications   DISCONTD: nirmatrelvir/ritonavir EUA (PAXLOVID) 20 x 150 MG & 10 x '100MG'$  TABS    Sig: Take 3 tablets by mouth 2 (two) times daily for 5 days. (Take nirmatrelvir 150 mg two tablets twice daily for 5 days and ritonavir 100 mg one tablet twice daily for 5 days) Patient GFR is 62    Dispense:  30 tablet    Refill:  0    Order Specific Question:   Supervising Provider    Answer:   Noemi Chapel [[id number]]     *If you need refills on other medications prior to your next appointment, please contact your pharmacy*  Follow-Up: Call back or seek an in-person evaluation if the symptoms worsen or if the condition fails to improve as anticipated.  Other Instructions  Quarantine and Isolation Quarantine and isolation refer to local  and travel restrictions to protect the public and travelers from contagious diseases that constitute a public health threat. Contagious diseases are diseases that can spread from one person to another. Quarantine and isolation help to protect the public by preventing exposure to people who have or may have a contagious disease. Isolation separates people who are sick with a contagious disease from people who are not sick. Quarantine separates and restricts the movement of people who were exposed to a contagious disease to see if they become sick. You may be put in quarantine or isolation if you have been exposed to or diagnosed with any of the following diseases: Severe acute  respiratory syndromes, such as COVID-19. Cholera. Diphtheria. Tuberculosis. Plague. Smallpox. Yellow fever. Viral hemorrhagic fevers, such as [place], Ebola, and [place]. When to quarantine or isolate Follow these rules, whether you have been vaccinated or not: Stay home and isolate from others when you are sick with a contagious disease. Isolate when you test positive for a contagious disease, even if you do not have symptoms. Isolate if you are sick and suspect that you may have a contagious disease. If you suspect that you have a contagious disease, get tested. If your test results are negative, you can end your isolation. If your test results are positive, follow the full isolation recommendations as told by your health care provider or local health authorities. Quarantine and stay away from others when you have been in close contact with someone who has tested positive for a contagious disease. Close contact is defined as being less than 6 ft (1.8 m) away from an infected person for a total of 15 minutes or more over a 24-hour period. Do not go to places where you are unable to wear a mask, such as restaurants and some gyms. Stay home and separate from others as much as possible. Avoid being around people who may get very sick from the contagious disease that you have. Use a separate bathroom, if possible. Do not travel. For travel guidance, visit the CDC's travel webpage at SwankBlog.no Follow these instructions at home: Medicines  Take over-the-counter and prescription medicines as told by your health care provider. Finish all antibiotic medicine even when you start to feel better. Stay up to date with all your vaccines. Get scheduled vaccines and boosters as recommended by your health care provider. Lifestyle Wear a high-quality mask if you must be around others at home and in public, if recommended. Improve air flow (ventilation) at home to help prevent the  disease from spreading to other people, if possible. Do not share personal household items, like cups, towels, and utensils. Practice everyday hygiene and cleaning. General instructions Talk to your health care provider if you have a weakened body defense system (immune system). People with a weakened immune system may have a reduced immune response to vaccines. You may need to follow current prevention measures, including wearing a well-fitting mask, avoiding crowds, and avoiding poorly ventilated indoor places. Monitor symptoms and follow health care provider instructions, which may include resting, drinking fluids, and taking medicines. Follow specific isolation and quarantine recommendations if you are in places that can lead to disease outbreaks, such as correctional and detention facilities, homeless shelters, and cruise ships. Return to your normal activities as told by your health care provider. Ask your health care provider what activities are safe for you. Keep all follow-up visits. This is important. Where to find more information CDC: SodaWaters.hu Contact a health care provider if: You have a  fever. You have signs and symptoms that return or get worse after isolation. Get help right away if: You have difficulty breathing. You have chest pain. These symptoms may be an emergency. Get help right away. Call 911. Do not wait to see if the symptoms will go away. Do not drive yourself to the hospital. Summary Isolation and quarantine help protect the public by preventing exposure to people who have or may have a contagious disease. Isolate when you are sick or when you test positive, even if you do not have symptoms. Quarantine and stay away from others when you have been in close contact with someone who has tested positive for a contagious disease. This information is not intended to replace advice given to you by your health care provider. Make sure you discuss  any questions you have with your health care provider. Document Revised: 07/09/2021 Document Reviewed: 06/18/2021 Elsevier Patient Education  [place].    If you have been instructed to have an in-person evaluation today at a local Urgent Care facility, please use the link below. It will take you to a list of all of our available [facility], including address, phone number and hours of operation. Please do not delay care.  [facility]  If you or a family member do not have a primary care provider, use the link below to schedule a visit and establish care. When you choose a [facility] primary care physician or advanced practice provider, you gain a long-term partner in health. Find a Primary Care Provider  Learn more about [facility] in-office and virtual care options: [facility]

## 2022-03-19 NOTE — Progress Notes (Signed)
The pt has reviewed the information in My Chart regarding appt date, time and location.

## 2022-03-19 NOTE — Progress Notes (Unsigned)
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   Thank you.   Corrie Mckusick    Per Dr. Roxan Hockey:   I think it would be helpful to needle the inferior potion of the mass to make sure it is a goiter and not something else like a thymoma. Agree with Sherren Mocha that she will need a combined approach with cervical resection and a sternotomy to remove it

## 2022-03-23 ENCOUNTER — Ambulatory Visit: Payer: Self-pay | Admitting: *Deleted

## 2022-03-23 ENCOUNTER — Telehealth: Payer: Self-pay

## 2022-03-23 NOTE — Telephone Encounter (Signed)
  Chief Complaint: numbness tingling Symptoms: numb, tingling Frequency: constant Pertinent Negatives: Patient denies other symptoms Disposition: '[]'$ ED /'[]'$ Urgent Care (no appt availability in office) / '[]'$ Appointment(In office/virtual)/ '[]'$  [facility]/ '[x]'$ Home Care/ '[]'$ Refused Recommended Disposition /'[]'$  Mobile Bus/ '[]'$  Follow-up with PCP Additional Notes: Pt states she chose not to take Paxlovid, all symptoms gone except numbness and tingling in hands and feet and lower legs below knee. She already has Gabapentin but has not been taking it. She is going to start taking it as ordered to see if that will give relief. She has upcoming appt will call back if not better.   Reason for Disposition  [1] COVID-19 diagnosed by doctor (or NP/PA) AND [2] mild symptoms (e.g., cough, fever, others) AND [2] no complications or SOB  Answer Assessment - Initial Assessment Questions 1. COVID-19 DIAGNOSIS: "How do you know that you have COVID?" (e.g., positive lab test or self-test, diagnosed by doctor or NP/PA, symptoms after exposure).     Home test positive 2. COVID-19 EXPOSURE: "Was there any known exposure to COVID before the symptoms began?" CDC Definition of close contact: within 6 feet (2 meters) for a total of 15 minutes or more over a 24-hour period.      Calling about specific symptom of numbness and tingling 3. ONSET: "When did the COVID-19 symptoms start?"      5 days ago 4. WORST SYMPTOM: "What is your worst symptom?" (e.g., cough, fever, shortness of breath, muscle aches)     Numbness and tingling  Protocols used: Coronavirus (COVID-19) Diagnosed or Suspected-A-AH

## 2022-03-23 NOTE — Telephone Encounter (Signed)
Called spoke with patient about COVID-1 questionnaire of worse symptoms of appetite. Kristina Adams states she has experiencing additional symptoms like tingling and numbness in her hands and feet. Advise her to speak with triage nurse.

## 2022-03-23 NOTE — Telephone Encounter (Signed)
FYI

## 2022-03-25 ENCOUNTER — Telehealth: Payer: Self-pay

## 2022-03-25 ENCOUNTER — Other Ambulatory Visit: Payer: Self-pay | Admitting: Nurse Practitioner

## 2022-03-25 ENCOUNTER — Other Ambulatory Visit: Payer: Self-pay | Admitting: Family Medicine

## 2022-03-25 ENCOUNTER — Other Ambulatory Visit: Payer: Self-pay

## 2022-03-25 ENCOUNTER — Encounter: Payer: Self-pay | Admitting: Hematology

## 2022-03-25 MED ORDER — DICLOFENAC SODIUM 1 % EX GEL
4.0000 g | Freq: Four times a day (QID) | CUTANEOUS | 2 refills | Status: DC
Start: 2022-03-25 — End: 2023-12-14
  Filled 2022-03-25: qty 300, 18d supply, fill #0
  Filled 2022-09-21: qty 300, 30d supply, fill #1

## 2022-03-25 MED ORDER — CYCLOBENZAPRINE HCL 10 MG PO TABS
10.0000 mg | ORAL_TABLET | Freq: Three times a day (TID) | ORAL | 2 refills | Status: DC | PRN
  Filled 2022-03-25: qty 90, 30d supply, fill #0
  Filled 2022-06-27: qty 90, 30d supply, fill #1

## 2022-03-25 NOTE — Telephone Encounter (Signed)
Called, spoke with patient in regards to the [place] for symptoms of worse appetite. Patient states that now is is unable to taste or smell. She dose not have an appetite. Advise pt to get virtual appointment to discuss symptoms further, patient declined. Advised pt that if she continues to feel bad to seek help at the urgent care or ED. Patient verb understanding.

## 2022-03-26 ENCOUNTER — Encounter: Payer: Self-pay | Admitting: Hematology

## 2022-03-26 ENCOUNTER — Telehealth: Payer: Self-pay | Admitting: Gastroenterology

## 2022-03-26 ENCOUNTER — Other Ambulatory Visit: Payer: Self-pay | Admitting: Nurse Practitioner

## 2022-03-26 ENCOUNTER — Other Ambulatory Visit: Payer: Self-pay

## 2022-03-26 MED ORDER — GABAPENTIN 300 MG PO CAPS
300.0000 mg | ORAL_CAPSULE | Freq: Every day | ORAL | 0 refills | Status: DC
  Filled 2022-03-26: qty 30, 30d supply, fill #0
  Filled 2022-06-02: qty 30, 30d supply, fill #1

## 2022-03-26 NOTE — Telephone Encounter (Signed)
Patient called states she would like to speak with you regarding her  coming up appointment on 10/5 at [facility] with Dr. Rush Landmark. Please call to advise.

## 2022-03-26 NOTE — Telephone Encounter (Signed)
Requested Prescriptions  Pending Prescriptions Disp Refills  . gabapentin (NEURONTIN) 300 MG capsule 90 capsule 1    Sig: TAKE 1 CAPSULE (300 MG TOTAL) BY MOUTH AT BEDTIME.     Neurology: Anticonvulsants - gabapentin Failed - 03/26/2022 12:35 PM      Failed - Cr in normal range and within 360 days    Creatinine  Date Value Ref Range Status  07/08/2014 0.8 0.6 - 1.1 mg/dL Final   Creat  Date Value Ref Range Status  09/24/2019 0.81 0.50 - 1.05 mg/dL Final    Comment:    For patients >41 years of age, the reference limit for Creatinine is approximately 13% higher for people identified as African-American. .    Creatinine, Ser  Date Value Ref Range Status  03/01/2022 1.05 (H) 0.57 - 1.00 mg/dL Final         Passed - Completed PHQ-2 or PHQ-9 in the last 360 days      Passed - Valid encounter within last 12 months    Recent Outpatient Visits          3 weeks ago Type 2 diabetes mellitus with hyperglycemia, with long-term current use of insulin [facility])   [place], [place] W, NP   2 months ago Acute cystitis without hematuria   [place], Vernia Buff, NP   3 months ago Essential hypertension   [place], Vernia Buff, NP   9 months ago Encounter for Papanicolaou smear for cervical cancer screening   [place], [place] W, NP   10 months ago Type 2 diabetes mellitus with hyperglycemia, with long-term current use of insulin [facility])   [place], Vernia Buff, NP      Future Appointments            In 2 months Gildardo Pounds, NP [place]

## 2022-03-26 NOTE — Telephone Encounter (Signed)
The pt called and cancelled the upcoming case for the EUS COLON EGD.  She tells me that she has also cancelled all other appts regarding this issue.  She says that she is not interested in proceeding with any procedures, imaging or other appts at this time.  She tells me that she does not want to be contacted with ANY appts in the future.  She says she will let us know when and if she wants to proceed.  I did try and explain the importance of the procedure and surgical appts.  She says "this has been here for years".  I have cancelled the appt as asked.

## 2022-03-26 NOTE — Telephone Encounter (Signed)
Kristina Adams, I am sorry to hear about this. She certainly has the right and ability to decide how she wants to approach things/her care. When I had spoken with her previously (earlier this month) she seemed to agree with the previously outlined plan of surgical evaluation and endoscopic evaluation. But at this point, I would leave it up to her and her primary care provider to decide what/if she wants any procedures or any other work-up. Thanks for your help. GM

## 2022-03-29 ENCOUNTER — Other Ambulatory Visit: Payer: Self-pay

## 2022-03-31 ENCOUNTER — Telehealth: Payer: Self-pay

## 2022-03-31 NOTE — Telephone Encounter (Signed)
Called, spoke with patient about COVID questionnaire. She states that she is having some sinus pressure and had some dizziness at night. Advised patient that sinus drainage can cause dizziness. Advise patient on care recommendations for cough from my chart companion. Advise patient if she is still feeling bad Monday, to schedule OV with PCP. Patient verb understanding.

## 2022-04-13 ENCOUNTER — Encounter: Payer: Commercial Managed Care - HMO | Admitting: Thoracic Surgery (Cardiothoracic Vascular Surgery)

## 2022-04-15 ENCOUNTER — Ambulatory Visit (HOSPITAL_COMMUNITY): Admit: 2022-04-15 | Payer: Commercial Managed Care - HMO | Admitting: Gastroenterology

## 2022-04-15 ENCOUNTER — Encounter (HOSPITAL_COMMUNITY): Payer: Self-pay

## 2022-04-15 SURGERY — UPPER ENDOSCOPIC ULTRASOUND (EUS) RADIAL
Anesthesia: Monitor Anesthesia Care

## 2022-04-21 ENCOUNTER — Other Ambulatory Visit: Payer: Self-pay | Admitting: Family Medicine

## 2022-04-21 ENCOUNTER — Encounter: Payer: Self-pay | Admitting: Hematology

## 2022-04-21 ENCOUNTER — Other Ambulatory Visit: Payer: Self-pay

## 2022-04-21 DIAGNOSIS — E1165 Type 2 diabetes mellitus with hyperglycemia: Secondary | ICD-10-CM

## 2022-04-21 DIAGNOSIS — K219 Gastro-esophageal reflux disease without esophagitis: Secondary | ICD-10-CM

## 2022-04-21 MED ORDER — TRULICITY 3 MG/0.5ML ~~LOC~~ SOAJ
3.0000 mg | SUBCUTANEOUS | 2 refills | Status: DC
  Filled 2022-04-21: qty 2, 28d supply, fill #0
  Filled 2022-06-28: qty 4, 56d supply, fill #1

## 2022-04-21 MED ORDER — PANTOPRAZOLE SODIUM 40 MG PO TBEC
40.0000 mg | DELAYED_RELEASE_TABLET | Freq: Two times a day (BID) | ORAL | 2 refills | Status: DC
  Filled 2022-04-21: qty 60, 30d supply, fill #0
  Filled 2022-04-26: qty 30, 30d supply, fill #0
  Filled 2022-05-24: qty 30, 30d supply, fill #1
  Filled 2022-06-10: qty 30, 30d supply, fill #2
  Filled 2022-06-25 – 2022-06-27 (×2): qty 60, 30d supply, fill #3
  Filled 2022-07-26: qty 30, 15d supply, fill #4

## 2022-04-22 ENCOUNTER — Other Ambulatory Visit: Payer: Self-pay

## 2022-04-26 ENCOUNTER — Other Ambulatory Visit: Payer: Self-pay

## 2022-05-04 ENCOUNTER — Other Ambulatory Visit: Payer: Self-pay

## 2022-05-05 ENCOUNTER — Other Ambulatory Visit: Payer: Self-pay

## 2022-05-13 ENCOUNTER — Other Ambulatory Visit: Payer: Self-pay | Admitting: Radiology

## 2022-05-13 DIAGNOSIS — J9859 Other diseases of mediastinum, not elsewhere classified: Secondary | ICD-10-CM

## 2022-05-17 ENCOUNTER — Ambulatory Visit (HOSPITAL_COMMUNITY)
Admission: RE | Admit: 2022-05-17 | Discharge: 2022-05-17 | Disposition: A | Discharge status: Home / Self Care | Payer: Commercial Managed Care - HMO | Source: Ambulatory Visit | Attending: Interventional Radiology | Admitting: Interventional Radiology

## 2022-05-17 ENCOUNTER — Encounter (HOSPITAL_COMMUNITY): Payer: Self-pay

## 2022-05-17 DIAGNOSIS — J9859 Other diseases of mediastinum, not elsewhere classified: Secondary | ICD-10-CM | POA: Insufficient documentation

## 2022-05-17 DIAGNOSIS — R9389 Abnormal findings on diagnostic imaging of other specified body structures: Secondary | ICD-10-CM | POA: Diagnosis present

## 2022-05-17 MED ORDER — SODIUM CHLORIDE 0.9 % IV SOLN: INTRAVENOUS | Status: DC

## 2022-05-17 NOTE — Progress Notes (Signed)
Roselyn Reef PA with radiology stated patient is cancelled for today due to eating granola bar  and will be rescheduled.

## 2022-05-17 NOTE — Progress Notes (Signed)
Patient scheduled for substernal mass biopsy in IR today. She ate this morning and is unable to receive moderate sedation. Procedure will be rescheduled. Patient aware that a scheduler will call her with a date/time.   Kristina Adams, [place] [phone] 05/17/2022, 9:14 AM

## 2022-05-24 ENCOUNTER — Other Ambulatory Visit: Payer: Self-pay

## 2022-05-27 ENCOUNTER — Other Ambulatory Visit: Payer: Self-pay | Admitting: Radiology

## 2022-05-27 DIAGNOSIS — J9859 Other diseases of mediastinum, not elsewhere classified: Secondary | ICD-10-CM

## 2022-05-29 ENCOUNTER — Other Ambulatory Visit: Payer: Self-pay | Admitting: Student

## 2022-05-31 ENCOUNTER — Ambulatory Visit (HOSPITAL_COMMUNITY)
Admission: RE | Admit: 2022-05-31 | Discharge: 2022-05-31 | Disposition: A | Discharge status: Home / Self Care | Payer: Commercial Managed Care - HMO | Source: Ambulatory Visit | Attending: Interventional Radiology | Admitting: Interventional Radiology

## 2022-05-31 ENCOUNTER — Encounter (HOSPITAL_COMMUNITY): Payer: Self-pay

## 2022-05-31 ENCOUNTER — Other Ambulatory Visit: Payer: Self-pay

## 2022-05-31 DIAGNOSIS — E785 Hyperlipidemia, unspecified: Secondary | ICD-10-CM | POA: Diagnosis not present

## 2022-05-31 DIAGNOSIS — R222 Localized swelling, mass and lump, trunk: Secondary | ICD-10-CM | POA: Insufficient documentation

## 2022-05-31 DIAGNOSIS — F17290 Nicotine dependence, other tobacco product, uncomplicated: Secondary | ICD-10-CM | POA: Insufficient documentation

## 2022-05-31 DIAGNOSIS — K219 Gastro-esophageal reflux disease without esophagitis: Secondary | ICD-10-CM | POA: Diagnosis not present

## 2022-05-31 DIAGNOSIS — Z8249 Family history of ischemic heart disease and other diseases of the circulatory system: Secondary | ICD-10-CM | POA: Insufficient documentation

## 2022-05-31 DIAGNOSIS — J9859 Other diseases of mediastinum, not elsewhere classified: Secondary | ICD-10-CM

## 2022-05-31 DIAGNOSIS — I10 Essential (primary) hypertension: Secondary | ICD-10-CM | POA: Insufficient documentation

## 2022-05-31 LAB — CBC
HCT: 33.2 % — ABNORMAL LOW (ref 36.0–46.0)
Hemoglobin: 10.6 g/dL — ABNORMAL LOW (ref 12.0–15.0)
MCH: 28.1 pg (ref 26.0–34.0)
MCHC: 31.9 g/dL (ref 30.0–36.0)
MCV: 88.1 fL (ref 80.0–100.0)
Platelets: 297 10*3/uL (ref 150–400)
RBC: 3.77 MIL/uL — ABNORMAL LOW (ref 3.87–5.11)
RDW: 13.6 % (ref 11.5–15.5)
WBC: 5.1 10*3/uL (ref 4.0–10.5)
nRBC: 0 % (ref 0.0–0.2)

## 2022-05-31 LAB — GLUCOSE, CAPILLARY: Glucose-Capillary: 192 mg/dL — ABNORMAL HIGH (ref 70–99)

## 2022-05-31 LAB — PROTIME-INR
INR: 1.1 (ref 0.8–1.2)
Prothrombin Time: 14.3 seconds (ref 11.4–15.2)

## 2022-05-31 MED ORDER — MIDAZOLAM HCL 2 MG/2ML IJ SOLN
INTRAMUSCULAR | Status: AC
  Filled 2022-05-31: qty 2

## 2022-05-31 MED ORDER — FENTANYL CITRATE (PF) 100 MCG/2ML IJ SOLN
INTRAMUSCULAR | Status: AC
  Filled 2022-05-31: qty 2

## 2022-05-31 MED ORDER — MIDAZOLAM HCL 2 MG/2ML IJ SOLN
INTRAMUSCULAR | Status: AC | PRN
  Administered 2022-05-31: 1 mg via INTRAVENOUS

## 2022-05-31 MED ORDER — SODIUM CHLORIDE 0.9 % IV SOLN: INTRAVENOUS | Status: DC

## 2022-05-31 MED ORDER — FENTANYL CITRATE (PF) 100 MCG/2ML IJ SOLN
INTRAMUSCULAR | Status: AC | PRN
  Administered 2022-05-31 (×2): 25 ug via INTRAVENOUS

## 2022-05-31 NOTE — H&P (Signed)
Chief Complaint: Patient was seen in consultation today for mediastinal mass  Referring Physician(s): Dr. Roxan Hockey  Supervising Physician: Corrie Mckusick  Patient Status: Kristina Adams - Out-pt  History of Present Illness: Kristina Adams is a 56 y.o. female with past medical history of anxiety, back pain, GERD, HLD, HTN, hiatal hernia who presents to [facility] today for mediastinal mass biopsy.  Patient case was discussed in multidisciplinary conference 03/18/22 due to history of symptomatic goiter. Biopsy requested by [facility], Dr. Roxan Hockey to rule out thymoma prior to resection. Case reviewed and approved by Dr. Earleen Newport.   Patient assessed in [facility]. She complains of "horrific" indigestion which has prompted her pursuit of evaluation and treatment.  She has no new concerning symptoms today.  She is agreeable to proceed with biopsy.  She has been NPO.  Her son, Kristina Adams, is available for transportation and post-procedure recovery.   Past Medical History:  Diagnosis Date   Anemia 12/09/2011   child birth   Anxiety    Appendicitis, acute 12/03/2011   Arthritis    Chronic back pain 12/03/2011   Degenerative disc disease, cervical    low back area   Diabetes mellitus type 2, insulin dependent ([place]) 12/03/2011   GERD (gastroesophageal reflux disease)    H/O hiatal hernia    HLD (hyperlipidemia)    Hypertension 12/03/2011   Hyperthyroidism    IBS (irritable bowel syndrome)    Morbid obesity with BMI of 45.0-49.9, adult ([place]) 12/03/2011   Sleep apnea 12/03/2011   last sleep study May 2013    Past Surgical History:  Procedure Laterality Date   CESAREAN SECTION     x 2   LAPAROSCOPIC APPENDECTOMY  12/03/2011   Procedure: APPENDECTOMY LAPAROSCOPIC;  Surgeon: [name], DO;  Location: [facility];  Service: General;  Laterality: N/A;  drainage of intra-abdominal abscess    PARS PLANA VITRECTOMY Left 08/17/2012   Procedure: PARS PLANA VITRECTOMY WITH 25 GAUGE;  Surgeon: [name], MD;  Location: [place];  Service: Ophthalmology;  Laterality: Left;   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 Authorizing Provider  Cholecalciferol (VITAMIN D-3) 125 MCG (5000 UT) TABS Take 1 tablet by mouth daily. 09/24/19  Yes Hilts, Legrand Como, MD  cyclobenzaprine (FLEXERIL) 10 MG tablet TAKE 1 TABLET (10 MG TOTAL) BY MOUTH 3 (THREE) TIMES DAILY AS NEEDED FOR MUSCLE SPASMS. 03/25/22  Yes Newlin, Charlane Ferretti, MD  diclofenac Sodium (VOLTAREN) 1 % GEL APPLY 4 G TOPICALLY 4 (FOUR) TIMES DAILY. 03/25/22  Yes Newlin, Charlane Ferretti, MD  Dulaglutide (TRULICITY) 3 OA/4.1YS SOPN Inject 3 mg as directed once a week. 04/21/22  Yes Newlin, Enobong, MD  fenofibrate 160 MG tablet TAKE 1 TABLET (160 MG TOTAL) BY MOUTH DAILY. 11/27/21  Yes Gildardo Pounds, NP  gabapentin (NEURONTIN) 300 MG capsule TAKE 1 CAPSULE (300 MG TOTAL) BY MOUTH AT BEDTIME. 03/26/22 03/26/23 Yes Gildardo Pounds, NP  glipiZIDE (GLUCOTROL) 5 MG tablet Take 1 tablet (5 mg total) by mouth 2 (two) times daily before a meal. 11/27/21  Yes Gildardo Pounds, NP  hydrochlorothiazide (HYDRODIURIL) 25 MG tablet TAKE 1 TABLET (25 MG TOTAL) BY MOUTH DAILY. 11/27/21  Yes Gildardo Pounds, NP  Insulin Glargine (BASAGLAR KWIKPEN) 100 UNIT/ML Inject into the skin 45 units in the am and 40 units in the pm 11/28/21  Yes Gildardo Pounds, NP  lisinopril (ZESTRIL) 40 MG tablet Take 1 tablet (40 mg total) by mouth daily. NEEDS PASS 11/27/21  Yes Gildardo Pounds, NP  methimazole (TAPAZOLE) 5 MG tablet Take 1 tablet (5 mg total) by mouth 2 (two) times daily. 03/01/22  Yes Gildardo Pounds, NP  pantoprazole (PROTONIX) 40 MG tablet Take 1 tablet (40 mg total) by  mouth 2 (two) times daily. 04/21/22  Yes Charlott Rakes, MD  rosuvastatin (CRESTOR) 5 MG tablet Take 1 tablet (5 mg total) by mouth daily. 12/04/21  Yes Gildardo Pounds, NP  vitamin C (ASCORBIC ACID) 500 MG tablet Take 1,000 mg by mouth daily.   Yes [provider]  Insulin Pen Needle (B-D UF III MINI PEN NEEDLES) 31G X 5 MM MISC Use as instructed. Inject into the skin twice daily. NEEDS PASS 09/29/20   Gildardo Pounds, NP     Family History  Problem Relation Age of Onset   Breast cancer Mother 45   Diabetes Mother    Hypertension Mother    Kidney failure Father    Heart disease Father        No details   Diabetes Sister    Hypertension Sister    Hyperlipidemia Sister    Breast cancer Maternal Grandmother    Hypertension Maternal Grandmother    Kidney disease Maternal Grandfather    Cancer Maternal Grandfather        type unknown   Diabetes Paternal Grandmother    Diabetes Paternal Grandfather    Diabetes Daughter    Diabetes Maternal Uncle    Cancer Maternal Uncle        type unknown   Diabetes Paternal Aunt    Hypertension Paternal Aunt    Breast cancer Paternal Aunt    Fibromyalgia Paternal Aunt     Social History   Socioeconomic History   Marital status: Divorced    Spouse name: Not on file   Number of children: 2   Years of education: Not on file   Highest education level: Not on file  Occupational History   Not on file  Tobacco Use   Smoking status: Every Day    Types: Cigars   Smokeless tobacco: Never  Vaping Use   Vaping Use: Never used  Substance and Sexual Activity   Alcohol use: Yes    Alcohol/week: 1.0 standard drink of alcohol    Types: 1 Glasses of wine per week    Comment: occasional   Drug use: No   Sexual activity: Yes    Birth control/protection: Pill  Other Topics Concern   Not on file  Social History Narrative   Lives alone.    Social Determinants of Health   Financial Resource Strain: Not on file  Food Insecurity: Not on  file  Transportation Needs: Not on file  Physical Activity: Not on file  Stress: Not on file  Social Connections: Not on file     Review of Systems: A 12 point ROS discussed and pertinent positives are indicated in the HPI above.  All other systems are negative.  Review of Systems  Constitutional:  Negative for fatigue and fever.  Respiratory:  Negative for cough, choking, chest tightness and shortness of breath.   Cardiovascular:  Negative for chest pain.  Gastrointestinal:  Negative for abdominal pain.  Musculoskeletal:  Negative for  back pain.  Psychiatric/Behavioral:  Negative for behavioral problems and confusion.     Vital Signs: BP (!) 144/86   Pulse 76   Temp 97.8 F (36.6 C) (Temporal)   Resp 17   Ht '5\' 10"'$  (1.778 m)   Wt 300 lb (136.1 kg)   LMP 08/09/2012   SpO2 100%   BMI 43.05 kg/m   Physical Exam Vitals and nursing note reviewed.  Constitutional:      General: She is not in acute distress.    Appearance: Normal appearance. She is not ill-appearing.  HENT:     Mouth/Throat:     Mouth: Mucous membranes are moist.     Pharynx: Oropharynx is clear.  Neck:     Comments: Enlarged goiter, right greater than left Cardiovascular:     Rate and Rhythm: Normal rate and regular rhythm.  Pulmonary:     Effort: Pulmonary effort is normal.     Breath sounds: Normal breath sounds.  Abdominal:     General: Abdomen is flat. There is no distension.     Palpations: Abdomen is soft.  Musculoskeletal:     Cervical back: Normal range of motion and neck supple.  Skin:    General: Skin is warm and dry.  Neurological:     General: No focal deficit present.     Mental Status: She is alert and oriented to person, place, and time. Mental status is at baseline.  Psychiatric:        Mood and Affect: Mood normal.        Behavior: Behavior normal.        Thought Content: Thought content normal.        Judgment: Judgment normal.      MD Evaluation Airway: WNL Heart:  WNL Abdomen: WNL Chest/ Lungs: WNL ASA  Classification: 3 Mallampati/Airway Score: Two   Imaging: No results found.  Labs:  CBC: Recent Labs    11/24/21 1559 03/01/22 1500 05/31/22 0929  WBC 7.1 6.5 5.1  HGB 11.2* 11.1 10.6*  HCT 34.7* 34.7 33.2*  PLT 324.0 333 297    COAGS: Recent Labs    05/31/22 0929  INR 1.1    BMP: Recent Labs    11/24/21 1559 11/27/21 1509 03/01/22 1500  NA 139 143 139  K 4.1 4.5 4.2  CL 102 101 101  CO2 '29 25 25  '$ GLUCOSE 226* 108* 216*  BUN '20 14 19  '$ CALCIUM 9.6 9.7 9.5  CREATININE 1.01 1.01* 1.05*    LIVER FUNCTION TESTS: Recent Labs    11/24/21 1559 11/27/21 1509 03/01/22 1500  BILITOT 0.2 0.2 0.3  AST '10 10 13  '$ ALT '8 11 9  '$ ALKPHOS 50 55 67  PROT 7.2 7.1 7.3  ALBUMIN 4.3 4.4 4.4    TUMOR MARKERS: No results for input(s): "AFPTM", "CEA", "CA199", "CHROMGRNA" in the last 8760 hours.  Assessment and Plan: Patient with past medical history of goiter presents with complaint of recent worsening of symptoms including indigestion, belching, reflux.  IR consulted for mediastinal mass biopsy at the request of Dr. Roxan Hockey. Case reviewed by Dr. Earleen Newport who approves patient for procedure.  Patient presents today in their usual state of health.  She has been NPO and is not currently on blood thinners.   Risks and benefits of mediastinal mass biopsy was discussed with the patient including, but not limited to bleeding, hemoptysis, respiratory failure requiring intubation, infection, pneumothorax requiring chest tube placement, stroke from air embolism or even death.  All of  the patient's questions were answered and the patient is agreeable to proceed.  Consent signed and in chart.  Thank you for this interesting consult.  I greatly enjoyed meeting ASENETH HACK and look forward to participating in their care.  A copy of this report was sent to the requesting provider on this date.  Electronically Signed: Docia Barrier, PA 05/31/2022, 9:52 AM   I spent a total of  30 Minutes   in face to face in clinical consultation, greater than 50% of which was counseling/coordinating care for mediastinal mass.

## 2022-05-31 NOTE — Procedures (Signed)
Interventional Radiology Procedure Note  Procedure: CT guided biopsy of substernal mass Complications: None Recommendations: - Bedrest 1 hr - Follow up path - Advance diet - 1 hr dc home  Signed,  Corrie Mckusick, DO

## 2022-06-01 LAB — SURGICAL PATHOLOGY

## 2022-06-02 ENCOUNTER — Other Ambulatory Visit: Payer: Self-pay | Admitting: Nurse Practitioner

## 2022-06-02 ENCOUNTER — Ambulatory Visit: Payer: Commercial Managed Care - HMO | Attending: Nurse Practitioner | Admitting: Nurse Practitioner

## 2022-06-02 ENCOUNTER — Encounter: Payer: Self-pay | Admitting: Nurse Practitioner

## 2022-06-02 ENCOUNTER — Other Ambulatory Visit: Payer: Self-pay

## 2022-06-02 ENCOUNTER — Encounter: Payer: Self-pay | Admitting: Hematology

## 2022-06-02 VITALS — BP 155/76 | Wt 306.4 lb

## 2022-06-02 DIAGNOSIS — Z794 Long term (current) use of insulin: Secondary | ICD-10-CM | POA: Diagnosis not present

## 2022-06-02 DIAGNOSIS — R109 Unspecified abdominal pain: Secondary | ICD-10-CM

## 2022-06-02 DIAGNOSIS — E0590 Thyrotoxicosis, unspecified without thyrotoxic crisis or storm: Secondary | ICD-10-CM

## 2022-06-02 DIAGNOSIS — F321 Major depressive disorder, single episode, moderate: Secondary | ICD-10-CM | POA: Diagnosis not present

## 2022-06-02 DIAGNOSIS — E1165 Type 2 diabetes mellitus with hyperglycemia: Secondary | ICD-10-CM | POA: Diagnosis not present

## 2022-06-02 DIAGNOSIS — I10 Essential (primary) hypertension: Secondary | ICD-10-CM

## 2022-06-02 DIAGNOSIS — E785 Hyperlipidemia, unspecified: Secondary | ICD-10-CM

## 2022-06-02 DIAGNOSIS — E1142 Type 2 diabetes mellitus with diabetic polyneuropathy: Secondary | ICD-10-CM

## 2022-06-02 LAB — POCT GLYCOSYLATED HEMOGLOBIN (HGB A1C): HbA1c, POC (controlled diabetic range): 8.8 % — AB (ref 0.0–7.0)

## 2022-06-02 MED ORDER — LISINOPRIL 40 MG PO TABS
40.0000 mg | ORAL_TABLET | Freq: Every day | ORAL | 1 refills | Status: DC
  Filled 2022-06-02: qty 90, 90d supply, fill #0
  Filled 2022-06-27: qty 30, 30d supply, fill #0
  Filled 2022-07-26: qty 30, 30d supply, fill #1
  Filled 2022-08-19: qty 30, 30d supply, fill #2
  Filled 2022-09-21: qty 30, 30d supply, fill #3

## 2022-06-02 MED ORDER — GABAPENTIN 300 MG PO CAPS
300.0000 mg | ORAL_CAPSULE | Freq: Every day | ORAL | 1 refills | Status: DC
  Filled 2022-06-02: qty 90, 90d supply, fill #0
  Filled 2022-06-27: qty 30, 30d supply, fill #0
  Filled 2022-07-26: qty 30, 30d supply, fill #1
  Filled 2022-08-19: qty 30, 30d supply, fill #2
  Filled 2022-09-21: qty 30, 30d supply, fill #3

## 2022-06-02 MED ORDER — GLIPIZIDE 10 MG PO TABS
10.0000 mg | ORAL_TABLET | Freq: Two times a day (BID) | ORAL | 1 refills | Status: DC
  Filled 2022-06-02: qty 60, 30d supply, fill #0
  Filled 2022-06-27: qty 60, 30d supply, fill #1
  Filled 2022-07-26: qty 60, 30d supply, fill #2
  Filled 2022-08-19: qty 60, 30d supply, fill #3
  Filled 2022-09-21 – 2022-10-19 (×3): qty 60, 30d supply, fill #4

## 2022-06-02 MED ORDER — TRUE METRIX BLOOD GLUCOSE TEST VI STRP
ORAL_STRIP | 1 refills | Status: DC
  Filled 2022-06-02: qty 50, 30d supply, fill #0
  Filled 2022-06-11: qty 50, 25d supply, fill #0
  Filled 2022-07-19: qty 50, 25d supply, fill #1
  Filled 2022-08-16: qty 50, 25d supply, fill #2
  Filled 2022-09-21: qty 50, 25d supply, fill #3
  Filled 2023-02-06: qty 100, fill #3

## 2022-06-02 MED ORDER — HYDROCHLOROTHIAZIDE 25 MG PO TABS
25.0000 mg | ORAL_TABLET | Freq: Every day | ORAL | 1 refills | Status: DC
  Filled 2022-06-02: qty 90, 90d supply, fill #0
  Filled 2022-06-27: qty 30, 30d supply, fill #0
  Filled 2022-07-26: qty 30, 30d supply, fill #1
  Filled 2022-08-19: qty 30, 30d supply, fill #2
  Filled 2022-09-21: qty 30, 30d supply, fill #3

## 2022-06-02 MED ORDER — BLOOD GLUCOSE MONITOR KIT
PACK | 0 refills | Status: AC
  Filled 2022-06-02 – 2022-06-11 (×2): qty 1, 30d supply, fill #0

## 2022-06-02 MED ORDER — METHIMAZOLE 5 MG PO TABS
5.0000 mg | ORAL_TABLET | Freq: Two times a day (BID) | ORAL | 2 refills | Status: DC
  Filled 2022-06-02 – 2022-06-21 (×2): qty 60, 30d supply, fill #0
  Filled 2022-07-19: qty 60, 30d supply, fill #1
  Filled 2022-08-19: qty 60, 30d supply, fill #2

## 2022-06-02 MED ORDER — ONETOUCH DELICA PLUS LANCET33G MISC
1 refills | Status: AC
  Filled 2022-06-02 – 2022-06-11 (×2): qty 100, 30d supply, fill #0
  Filled 2022-07-19: qty 100, 30d supply, fill #1
  Filled 2022-09-21: qty 100, 30d supply, fill #2

## 2022-06-02 NOTE — Progress Notes (Signed)
Assessment & Plan:  [name] was seen today for diabetes and hypertension.  Diagnoses and all orders for this visit:  Type 2 diabetes mellitus with hyperglycemia, with long-term current use of insulin (HCC) -     POCT glycosylated hemoglobin (Hb A1C) -     CMP14+EGFR -     Microalbumin / creatinine urine ratio -     glipiZIDE (GLUCOTROL) 10 MG tablet; Take 1 tablet (10 mg total) by mouth 2 (two) times daily before a meal. -     Ambulatory referral to Podiatry -     blood glucose meter kit and supplies KIT; Dispense based on patient and insurance preference. Use up to 2 times daily as directed. -     glucose blood (TRUE METRIX BLOOD GLUCOSE TEST) test strip; Testing twice daily. -     Lancets (ONETOUCH DELICA PLUS [id number]) MISC; Testing twice daily  Essential hypertension -     hydrochlorothiazide (HYDRODIURIL) 25 MG tablet; TAKE 1 TABLET (25 MG TOTAL) BY MOUTH DAILY. -     lisinopril (ZESTRIL) 40 MG tablet; Take 1 tablet (40 mg total) by mouth daily. NEEDS PASS  Dyslipidemia, goal LDL below 70  Current moderate episode of major depressive disorder without prior episode ([place]) -     Ambulatory referral to [place]  Left flank pain -     Urinalysis, Complete  Diabetic polyneuropathy associated with type 2 diabetes mellitus (HCC) -     gabapentin (NEURONTIN) 300 MG capsule; Take 1 capsule (300 mg total) by mouth at bedtime.    Patient has been counseled on age-appropriate routine health concerns for screening and prevention. These are reviewed and up-to-date. Referrals have been placed accordingly. Immunizations are up-to-date or declined.    Subjective:   Chief Complaint  Patient presents with   Diabetes   Hypertension   HPI Kristina Adams 56 y.o. female presents to office today for follow up to DM and HTN  Had recent biopsy to rule out thymoma. Currently awaiting results.   She has a past medical history of Anemia (12/09/2011), Anxiety, Appendicitis,  acute (12/03/2011), Arthritis, Chronic back pain (12/03/2011), Degenerative disc disease, cervical, Diabetes mellitus type 2, insulin dependent (12/03/2011), GERD, H/O hiatal hernia, HLD, Hypertension (12/03/2011), Hyperthyroidism, IBS,  Morbid obesity with BMI of 45.0-49.9, adult (Kristina Adams) (12/03/2011), and Sleep apnea (12/03/2011).     HTN Blood pressure is not at goal today. She she does not have a blood pressure monitor at home.  Endorses adherence taking hydrochlorothiazide 25 mg daily and lisinopril 40 mg daily. BP Readings from Last 3 Encounters:  06/02/22 (!) 155/76  05/31/22 114/68  03/01/22 136/87   Endorses left flank pain over the past 2 weeks.  Denies dysuria, hematuria or vaginal discharge.  Would like to have urinalysis performed today.   She is concerned about her weight. I did recommend she purchase a pedometer with a goal of minimal 7-10,000 steps per. Diet is not optimal   DM 2 She is not taking Trulicity as she felt it was not effective.  At this time we will increase glipizide to 10 mg twice daily and she will continue on basaglar 45 units in the am and 40 units in the pm.  Had COVD in September and feels like her body has not been the same since then. Also feels blood sugars have been more elevated since then. Hyperglycemic symptoms include peripheral neuropathy for which she takes gabapentin.  Lab Results  Component Value Date   HGBA1C 8.8 (  A) 06/02/2022    Lab Results  Component Value Date   HGBA1C 8.7 03/01/2022    Lab Results  Component Value Date   LDLCALC 68 03/01/2022    Depression PHq9 is elevated. She is agreeable to referral for psychotherapy. Has health issues she is very concerned about and this is causing increased stress and depression.     11/27/2021    2:27 PM 06/24/2021    9:55 AM 05/18/2021    3:35 PM  Depression screen PHQ 2/9  Decreased Interest _0 Down, Depressed, Hopeless 3 0 2  PHQ - 2 Score _1 Altered sleeping _2 Tired,  decreased energy _3 Change in appetite _4 Feeling bad or failure about yourself  1 0 0  Trouble concentrating 2 0 1  Moving slowly or fidgety/restless 0 0 1  Suicidal thoughts 0 0 0  PHQ-9 Score _5 Difficult doing work/chores  Not difficult at all Not difficult at all      Review of Systems  Constitutional:  Negative for fever, malaise/fatigue and weight loss.  HENT: Negative.  Negative for nosebleeds.   Eyes: Negative.  Negative for blurred vision, double vision and photophobia.  Respiratory: Negative.  Negative for cough and shortness of breath.   Cardiovascular: Negative.  Negative for chest pain, palpitations and leg swelling.  Gastrointestinal: Negative.  Negative for heartburn, nausea and vomiting.  Musculoskeletal: Negative.  Negative for myalgias.  Neurological: Negative.  Negative for dizziness, focal weakness, seizures and headaches.  Psychiatric/Behavioral:  Positive for depression. Negative for suicidal ideas.     Past Medical History:  Diagnosis Date   Anemia 12/09/2011   child birth   Anxiety    Appendicitis, acute 12/03/2011   Arthritis    Chronic back pain 12/03/2011   Degenerative disc disease, cervical    low back area   Diabetes mellitus type 2, insulin dependent (Kristina Adams) 12/03/2011   GERD (gastroesophageal reflux disease)    H/O hiatal hernia    HLD (hyperlipidemia)    Hypertension 12/03/2011   Hyperthyroidism    IBS (irritable bowel syndrome)    Morbid obesity with BMI of 45.0-49.9, adult (Kristina Adams) 12/03/2011   Sleep apnea 12/03/2011   last sleep study May 2013    Past Surgical History:  Procedure Laterality Date   CESAREAN SECTION     x 2   LAPAROSCOPIC APPENDECTOMY  12/03/2011   Procedure: APPENDECTOMY LAPAROSCOPIC;  Surgeon: [name], DO;  [facility];  Service: General;  Laterality: N/A;  drainage of intra-abdominal abscess    PARS PLANA VITRECTOMY Left 08/17/2012   Procedure: PARS PLANA VITRECTOMY WITH 25 GAUGE;  Surgeon:  [name], MD;  Location: [place];  Service: Ophthalmology;  Laterality: Left;   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   Procedure: REPAIR OF COMPLEX TRACTION RETINAL DETACHMENT;  Surgeon: [name], MD;  Location: [place];  Service: Ophthalmology;  Laterality: Left;   TUBAL LIGATION      Family History  Problem Relation Age of Onset   Breast cancer Mother 75   Diabetes Mother    Hypertension Mother    Kidney failure Father    Heart disease Father        No details   Diabetes Sister    Hypertension Sister    Hyperlipidemia Sister    Breast cancer Maternal Grandmother    Hypertension Maternal Grandmother    Kidney disease  Maternal Grandfather    Cancer Maternal Grandfather        type unknown   Diabetes Paternal Grandmother    Diabetes Paternal Grandfather    Diabetes Daughter    Diabetes Maternal Uncle    Cancer Maternal Uncle        type unknown   Diabetes Paternal Aunt    Hypertension Paternal Aunt    Breast cancer Paternal Aunt    Fibromyalgia Paternal Aunt     Social History Reviewed with no changes to be made today.   Outpatient Medications Prior to Visit  Medication Sig Dispense Refill   Cholecalciferol (VITAMIN D-3) 125 MCG (5000 UT) TABS Take 1 tablet by mouth daily. 90 tablet 3   cyclobenzaprine (FLEXERIL) 10 MG tablet TAKE 1 TABLET (10 MG TOTAL) BY MOUTH 3 (THREE) TIMES DAILY AS NEEDED FOR MUSCLE SPASMS. 90 tablet 2   diclofenac Sodium (VOLTAREN) 1 % GEL APPLY 4 G TOPICALLY 4 (FOUR) TIMES DAILY. 300 g 2   fenofibrate 160 MG tablet TAKE 1 TABLET (160 MG TOTAL) BY MOUTH DAILY. 90 tablet 3   Insulin Glargine (BASAGLAR KWIKPEN) 100 UNIT/ML Inject into the skin 45 units in the am and 40 units in the pm 78 mL 3   Insulin Pen Needle (B-D UF III MINI PEN NEEDLES) 31G X 5 MM MISC Use as instructed. Inject into the skin twice daily. NEEDS PASS 100 each 6   pantoprazole (PROTONIX) 40 MG tablet Take 1 tablet (40 mg total) by mouth 2 (two)  times daily. 60 tablet 2   rosuvastatin (CRESTOR) 5 MG tablet Take 1 tablet (5 mg total) by mouth daily. 90 tablet 3   vitamin C (ASCORBIC ACID) 500 MG tablet Take 1,000 mg by mouth daily.     gabapentin (NEURONTIN) 300 MG capsule TAKE 1 CAPSULE (300 MG TOTAL) BY MOUTH AT BEDTIME. 90 capsule 0   glipiZIDE (GLUCOTROL) 5 MG tablet Take 1 tablet (5 mg total) by mouth 2 (two) times daily before a meal. 180 tablet 1   hydrochlorothiazide (HYDRODIURIL) 25 MG tablet TAKE 1 TABLET (25 MG TOTAL) BY MOUTH DAILY. 90 tablet 1   lisinopril (ZESTRIL) 40 MG tablet Take 1 tablet (40 mg total) by mouth daily. NEEDS PASS 90 tablet 1   methimazole (TAPAZOLE) 5 MG tablet Take 1 tablet (5 mg total) by mouth 2 (two) times daily. 60 tablet 2   Dulaglutide (TRULICITY) 3 BZ/1.6RC SOPN Inject 3 mg as directed once a week. (Patient not taking: Reported on 06/02/2022) 2 mL 2   No facility-administered medications prior to visit.    Allergies  Allergen Reactions   Latex Other (See Comments)    Infection in left middle finger from latex gloves due to moisture.   Iohexol      Code: RASH, Desc: PATIENT CALLED 06/17/08-STATED AFTER 6 HOURS POST IV CONTRAST, HANDS,SCALP AND GROIN AREA DEVELOPED ITCHING,BURNING SENSATION, Onset Date: [id number]        Objective:    BP (!) 155/76   Wt (!) 306 lb 6.4 oz (139 kg)   LMP 08/09/2012   SpO2 98%   BMI 43.96 kg/m  Wt Readings from Last 3 Encounters:  06/02/22 (!) 306 lb 6.4 oz (139 kg)  05/31/22 300 lb (136.1 kg)  03/01/22 (!) 306 lb (138.8 kg)    Physical Exam Vitals and nursing note reviewed.  Constitutional:      Appearance: She is well-developed. She is obese.  HENT:     Head: Normocephalic and atraumatic.  Cardiovascular:  Rate and Rhythm: Normal rate and regular rhythm.     Heart sounds: Normal heart sounds. No murmur heard.    No friction rub. No gallop.  Pulmonary:     Effort: Pulmonary effort is normal. No tachypnea or respiratory distress.     Breath  sounds: Normal breath sounds. No decreased breath sounds, wheezing, rhonchi or rales.  Chest:     Chest wall: No tenderness.  Abdominal:     General: Bowel sounds are normal.     Palpations: Abdomen is soft.  Musculoskeletal:        General: Normal range of motion.     Cervical back: Normal range of motion.  Skin:    General: Skin is warm and dry.  Neurological:     Mental Status: She is alert and oriented to person, place, and time.     Coordination: Coordination normal.  Psychiatric:        Behavior: Behavior normal. Behavior is cooperative.        Thought Content: Thought content normal.        Judgment: Judgment normal.          Patient has been counseled extensively about nutrition and exercise as well as the importance of adherence with medications and regular follow-up. The patient was given clear instructions to go to ER or return to medical center if symptoms don't improve, worsen or new problems develop. The patient verbalized understanding.   Follow-up: Return in about 3 months (around 09/02/2022).   Gildardo Pounds, FNP-BC [facility], [place]   06/02/2022, 5:06 PM

## 2022-06-02 NOTE — Telephone Encounter (Signed)
Requested medication (s) are due for refill today: Yes  Requested medication (s) are on the active medication list: Yes  Last refill:  03/01/22  Future visit scheduled: Yes  Notes to clinic:  Unable to refill per protocol, cannot delegate.      Requested Prescriptions  Pending Prescriptions Disp Refills   methimazole (TAPAZOLE) 5 MG tablet 60 tablet 2    Sig: Take 1 tablet (5 mg total) by mouth 2 (two) times daily.     Not Delegated - Endocrinology:  Hyperthyroid Agents Failed - 06/02/2022 10:17 AM      Failed - This refill cannot be delegated      Failed - TSH in normal range and within 180 days    TSH  Date Value Ref Range Status  11/27/2021 0.946 0.450 - 4.500 uIU/mL Final         Failed - T3 Total in normal range and within 180 days    No results found for: "T3TOTAL", "T3FREE"       Failed - T4 free in normal range and within 180 days    T4, Total  Date Value Ref Range Status  11/27/2021 7.3 4.5 - 12.0 ug/dL Final         Passed - Valid encounter within last 6 months    Recent Outpatient Visits           Today Type 2 diabetes mellitus with hyperglycemia, with long-term current use of insulin [facility])   [place], [place] W, NP   3 months ago Type 2 diabetes mellitus with hyperglycemia, with long-term current use of insulin [facility])   [place], [place] W, NP   4 months ago Acute cystitis without hematuria   [facility] [place], Vernia Buff, NP   6 months ago Essential hypertension   [place], Vernia Buff, NP   11 months ago Encounter for Papanicolaou smear for cervical cancer screening   [place], Vernia Buff, NP       Future Appointments             In 3 months Gildardo Pounds, NP [place]

## 2022-06-04 ENCOUNTER — Other Ambulatory Visit: Payer: Self-pay

## 2022-06-04 LAB — CMP14+EGFR
ALT: 10 IU/L (ref 0–32)
AST: 14 IU/L (ref 0–40)
Albumin/Globulin Ratio: 1.9 (ref 1.2–2.2)
Albumin: 4.9 g/dL (ref 3.8–4.9)
Alkaline Phosphatase: 70 IU/L (ref 44–121)
BUN/Creatinine Ratio: 15 (ref 9–23)
BUN: 13 mg/dL (ref 6–24)
Bilirubin Total: 0.2 mg/dL (ref 0.0–1.2)
CO2: 26 mmol/L (ref 20–29)
Calcium: 10 mg/dL (ref 8.7–10.2)
Chloride: 101 mmol/L (ref 96–106)
Creatinine, Ser: 0.88 mg/dL (ref 0.57–1.00)
Globulin, Total: 2.6 g/dL (ref 1.5–4.5)
Glucose: 219 mg/dL — ABNORMAL HIGH (ref 70–99)
Potassium: 4.6 mmol/L (ref 3.5–5.2)
Sodium: 142 mmol/L (ref 134–144)
Total Protein: 7.5 g/dL (ref 6.0–8.5)
eGFR: 77 mL/min/{1.73_m2} (ref >59)

## 2022-06-04 LAB — URINALYSIS, COMPLETE
Bilirubin, UA: NEGATIVE
Ketones, UA: NEGATIVE
Leukocytes,UA: NEGATIVE
Nitrite, UA: NEGATIVE
Protein,UA: NEGATIVE
RBC, UA: NEGATIVE
Specific Gravity, UA: 1.013 (ref 1.005–1.030)
Urobilinogen, Ur: 0.2 mg/dL (ref 0.2–1.0)
pH, UA: 6 (ref 5.0–7.5)

## 2022-06-04 LAB — MICROSCOPIC EXAMINATION
Casts: NONE SEEN /lpf
Epithelial Cells (non renal): >10 /hpf — AB (ref 0–10)
RBC, Urine: NONE SEEN /hpf (ref 0–2)

## 2022-06-04 LAB — MICROALBUMIN / CREATININE URINE RATIO
Creatinine, Urine: 82.6 mg/dL
Microalb/Creat Ratio: 9 mg/g creat (ref 0–29)
Microalbumin, Urine: 7.2 ug/mL

## 2022-06-09 ENCOUNTER — Other Ambulatory Visit: Payer: Self-pay

## 2022-06-10 ENCOUNTER — Other Ambulatory Visit: Payer: Self-pay

## 2022-06-10 ENCOUNTER — Telehealth: Payer: Self-pay | Admitting: Gastroenterology

## 2022-06-10 NOTE — Telephone Encounter (Signed)
Inbound call from patient wanting to reschedule for hospital procedure. Please advise.  Thank you

## 2022-06-11 ENCOUNTER — Other Ambulatory Visit: Payer: Self-pay

## 2022-06-11 NOTE — Telephone Encounter (Signed)
Left message on machine to call back  

## 2022-06-12 ENCOUNTER — Other Ambulatory Visit: Payer: Self-pay

## 2022-06-12 ENCOUNTER — Other Ambulatory Visit: Payer: Self-pay | Admitting: Nurse Practitioner

## 2022-06-12 DIAGNOSIS — N3000 Acute cystitis without hematuria: Secondary | ICD-10-CM

## 2022-06-12 MED ORDER — NITROFURANTOIN MONOHYD MACRO 100 MG PO CAPS
100.0000 mg | ORAL_CAPSULE | Freq: Two times a day (BID) | ORAL | 0 refills | Status: DC
  Filled 2022-06-12: qty 10, 5d supply, fill #0

## 2022-06-14 ENCOUNTER — Encounter: Payer: Self-pay | Admitting: Nurse Practitioner

## 2022-06-14 ENCOUNTER — Other Ambulatory Visit: Payer: Self-pay

## 2022-06-14 NOTE — Telephone Encounter (Signed)
Left message on machine to call back  

## 2022-06-15 NOTE — Telephone Encounter (Signed)
Patient is returning your call.  

## 2022-06-15 NOTE — Telephone Encounter (Signed)
Left message on machine to call back  

## 2022-06-16 ENCOUNTER — Other Ambulatory Visit: Payer: Self-pay

## 2022-06-18 ENCOUNTER — Other Ambulatory Visit: Payer: Self-pay

## 2022-06-18 DIAGNOSIS — R194 Change in bowel habit: Secondary | ICD-10-CM

## 2022-06-18 DIAGNOSIS — K229 Disease of esophagus, unspecified: Secondary | ICD-10-CM

## 2022-06-18 MED ORDER — NA SULFATE-K SULFATE-MG SULF 17.5-3.13-1.6 GM/177ML PO SOLN
1.0000 | Freq: Once | ORAL | 0 refills | Status: AC
  Filled 2022-06-18: qty 354, 1d supply, fill #0

## 2022-06-18 NOTE — Telephone Encounter (Signed)
Was able to speak with the pt and get her scheduled for EUS COLON EGD on 07/29/22 at 2 pm at [facility] with GM .  All information has been given to the pt and mailed to the home. Prep and referral made.

## 2022-06-21 ENCOUNTER — Other Ambulatory Visit: Payer: Self-pay

## 2022-06-21 ENCOUNTER — Ambulatory Visit: Payer: Commercial Managed Care - HMO | Admitting: Podiatry

## 2022-06-21 ENCOUNTER — Encounter: Payer: Self-pay | Admitting: Hematology

## 2022-06-21 VITALS — BP 157/72

## 2022-06-21 DIAGNOSIS — E1142 Type 2 diabetes mellitus with diabetic polyneuropathy: Secondary | ICD-10-CM

## 2022-06-21 DIAGNOSIS — E119 Type 2 diabetes mellitus without complications: Secondary | ICD-10-CM

## 2022-06-21 DIAGNOSIS — B351 Tinea unguium: Secondary | ICD-10-CM

## 2022-06-21 DIAGNOSIS — M2041 Other hammer toe(s) (acquired), right foot: Secondary | ICD-10-CM

## 2022-06-21 DIAGNOSIS — M2042 Other hammer toe(s) (acquired), left foot: Secondary | ICD-10-CM

## 2022-06-21 DIAGNOSIS — M79675 Pain in left toe(s): Secondary | ICD-10-CM

## 2022-06-21 DIAGNOSIS — M79674 Pain in right toe(s): Secondary | ICD-10-CM | POA: Diagnosis not present

## 2022-06-21 DIAGNOSIS — L84 Corns and callosities: Secondary | ICD-10-CM | POA: Diagnosis not present

## 2022-06-21 NOTE — Patient Instructions (Signed)
Recommend Skechers Loafers with stretchable uppers and memory foam insoles. They can be purchased at [employer], [employer] or [employer]. Also on [email or web address].   Corns and Calluses Corns are small areas of thickened skin that form on the top, sides, or tip of a toe. Corns have a cone-shaped core with a point that can press on a nerve below. This causes pain. Calluses are areas of thickened skin that can form anywhere on the body, including the hands, fingers, palms, soles of the feet, and heels. Calluses are usually larger than corns. What are the causes? Corns and calluses are caused by rubbing (friction) or pressure, such as from shoes that are too tight or do not fit properly. What increases the risk? Corns are more likely to develop in people who have misshapen toes (toe deformities), such as hammer toes. Calluses can form with friction to any area of the skin. They are more likely to develop in people who: Work with their hands. Wear shoes that fit poorly, are too tight, or are high-heeled. Have toe deformities. What are the signs or symptoms? Symptoms of a corn or callus include: A hard growth on the skin. Pain or tenderness under the skin. Redness and swelling. Increased discomfort while wearing tight-fitting shoes, if your feet are affected. If a corn or callus becomes infected, symptoms may include: Redness and swelling that gets worse. Pain. Fluid, blood, or pus draining from the corn or callus. How is this diagnosed? Corns and calluses may be diagnosed based on your symptoms, your medical history, and a physical exam. How is this treated? Treatment for corns and calluses may include: Removing the cause of the friction or pressure. This may involve: Changing your shoes. Wearing shoe inserts (orthotics) or other protective layers in your shoes, such as a corn pad. Wearing gloves. Applying medicine to the skin (topical medicine) to help soften skin in the hardened, thickened  areas. Removing layers of dead skin with a file to reduce the size of the corn or callus. Removing the corn or callus with a scalpel or laser. Taking antibiotic medicines, if your corn or callus is infected. Having surgery, if a toe deformity is the cause. Follow these instructions at home:  Take over-the-counter and prescription medicines only as told by your health care provider. If you were prescribed an antibiotic medicine, take it as told by your health care provider. Do not stop taking it even if your condition improves. Wear shoes that fit well. Avoid wearing high-heeled shoes and shoes that are too tight or too loose. Wear any padding, protective layers, gloves, or orthotics as told by your health care provider. Soak your hands or feet. Then use a file or pumice stone to soften your corn or callus. Do this as told by your health care provider. Check your corn or callus every day for signs of infection. Contact a health care provider if: Your symptoms do not improve with treatment. You have redness or swelling that gets worse. Your corn or callus becomes painful. You have fluid, blood, or pus coming from your corn or callus. You have new symptoms. Get help right away if: You develop severe pain with redness. Summary Corns are small areas of thickened skin that form on the top, sides, or tip of a toe. These can be painful. Calluses are areas of thickened skin that can form anywhere on the body, including the hands, fingers, palms, and soles of the feet. Calluses are usually larger than corns. Corns and calluses are caused  by rubbing (friction) or pressure, such as from shoes that are too tight or do not fit properly. Treatment may include wearing padding, protective layers, gloves, or orthotics as told by your health care provider. This information is not intended to replace advice given to you by your health care provider. Make sure you discuss any questions you have with your health  care provider. Document Revised: 10/25/2019 Document Reviewed: 10/25/2019 Elsevier Patient Education  [place].   Diabetes Mellitus and Foot Care Diabetes, also called diabetes mellitus, may cause problems with your feet and legs because of poor blood flow (circulation). Poor circulation may make your skin: Become thinner and drier. Break more easily. Heal more slowly. Peel and crack. You may also have nerve damage (neuropathy). This can cause decreased feeling in your legs and feet. This means that you may not notice minor injuries to your feet that could lead to more serious problems. Finding and treating problems early is the best way to prevent future foot problems. How to care for your feet Foot hygiene  Wash your feet daily with warm water and mild soap. Do not use hot water. Then, pat your feet and the areas between your toes until they are fully dry. Do not soak your feet. This can dry your skin. Trim your toenails straight across. Do not dig under them or around the cuticle. File the edges of your nails with an emery board or nail file. Apply a moisturizing lotion or petroleum jelly to the skin on your feet and to dry, brittle toenails. Use lotion that does not contain alcohol and is unscented. Do not apply lotion between your toes. Shoes and socks Wear clean socks or stockings every day. Make sure they are not too tight. Do not wear knee-high stockings. These may decrease blood flow to your legs. Wear shoes that fit well and have enough cushioning. Always look in your shoes before you put them on to be sure there are no objects inside. To break in new shoes, wear them for just a few hours a day. This prevents injuries on your feet. Wounds, scrapes, corns, and calluses  Check your feet daily for blisters, cuts, bruises, sores, and redness. If you cannot see the bottom of your feet, use a mirror or ask someone for help. Do not cut off corns or calluses or try to remove them  with medicine. If you find a minor scrape, cut, or break in the skin on your feet, keep it and the skin around it clean and dry. You may clean these areas with mild soap and water. Do not clean the area with peroxide, alcohol, or iodine. If you have a wound, scrape, corn, or callus on your foot, look at it several times a day to make sure it is healing and not infected. Check for: Redness, swelling, or pain. Fluid or blood. Warmth. Pus or a bad smell. General tips Do not cross your legs. This may decrease blood flow to your feet. Do not use heating pads or hot water bottles on your feet. They may burn your skin. If you have lost feeling in your feet or legs, you may not know this is happening until it is too late. Protect your feet from hot and cold by wearing shoes, such as at the beach or on hot pavement. Schedule a complete foot exam at least once a year or more often if you have foot problems. Report any cuts, sores, or bruises to your health care provider  right away. Where to find more information American Diabetes Association: diabetes.org Association of Diabetes Care & Education Specialists: diabeteseducator.org Contact a health care provider if: You have a condition that increases your risk of infection, and you have any cuts, sores, or bruises on your feet. You have an injury that is not healing. You have redness on your legs or feet. You feel burning or tingling in your legs or feet. You have pain or cramps in your legs and feet. Your legs or feet are numb. Your feet always feel cold. You have pain around any toenails. Get help right away if: You have a wound, scrape, corn, or callus on your foot and: You have signs of infection. You have a fever. You have a red line going up your leg. This information is not intended to replace advice given to you by your health care provider. Make sure you discuss any questions you have with your health care provider. Document Revised:  12/30/2021 Document Reviewed: 12/30/2021 Elsevier Patient Education  [place].

## 2022-06-21 NOTE — Progress Notes (Signed)
ANNUAL DIABETIC FOOT EXAM  Subjective: Kristina Adams presents today for diabetic foot evaluation and for annual diabetic foot examination.  Chief Complaint  Patient presents with   Nail Problem    DFC BS- "a little over 200" A1C-8.4 PCP-Zelda Fleming PCP VST-06/02/2022   Patient confirms h/o diabetes.  Patient relates 30 year h/o diabetes.  Patient denies any h/o foot wounds.  Patient has been diagnosed with neuropathy and it is managed with gabapentin.  Risk factors: uncontrolled diabetes, HTN, hyperlipidemia, current tobacco user.  Gildardo Pounds, NP is patient's PCP.   Past Medical History:  Diagnosis Date   Anemia 12/09/2011   child birth   Anxiety    Appendicitis, acute 12/03/2011   Arthritis    Chronic back pain 12/03/2011   Degenerative disc disease, cervical    low back area   Diabetes mellitus type 2, insulin dependent ([place]) 12/03/2011   GERD (gastroesophageal reflux disease)    H/O hiatal hernia    HLD (hyperlipidemia)    Hypertension 12/03/2011   Hyperthyroidism    IBS (irritable bowel syndrome)    Morbid obesity with BMI of 45.0-49.9, adult ([place]) 12/03/2011   Sleep apnea 12/03/2011   last sleep study May 2013   Patient Active Problem List   Diagnosis Date Noted   Abdominal pain 10/01/2019   Anemia 10/01/2019   Uterine leiomyoma 10/01/2019   Seasonal allergies 09/24/2019   Irritable bowel syndrome with both constipation and diarrhea 09/24/2019   Vitamin D deficiency 09/24/2019   Benign essential hypertension 11/05/2015   Gastroesophageal reflux disease 11/05/2015   Uncontrolled type 2 diabetes mellitus with diabetic polyneuropathy, with long-term current use of insulin 11/05/2015   Multinodular goiter 11/06/2013   Obesity, Class III, BMI 40-49.9 (morbid obesity) ([name]) 05/23/2013   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   Diabetic neuropathy ([place]) 12/13/2011   Iron deficiency anemia 12/09/2011   Sleep apnea 12/03/2011   Morbid obesity with  BMI of 45.0-49.9, adult ([place]) 12/03/2011   Chronic back pain 12/03/2011   Hypertension 12/03/2011   Depression 12/03/2011   Appendicitis, acute 12/03/2011   Diabetes mellitus type 2, insulin dependent ([place]) 12/03/2011   DM (diabetes mellitus) ([place]) 10/06/2011   HYPOGLYCEMIA 08/22/2010   ALLERGIC RHINITIS CAUSE UNSPECIFIED 08/22/2010   Thyrotoxicosis 06/27/2010   DIABETES MELLITUS, TYPE II, UNCONTROLLED 06/27/2010   Hyperlipidemia 06/27/2010   MORBID OBESITY 06/27/2010   Essential hypertension 06/27/2010   PERS HX NONCOMPLIANCE W/MED TX PRS HAZARDS HLTH 06/27/2010   NONSPECIFIC ABN FINDING RAD & OTH EXAM GI TRACT 19/41/7408   NEOPLASM UNCERTAIN BHV OTH&UNSPEC DIGESTIVE ORGN 02/26/2008   Past Surgical History:  Procedure Laterality Date   CESAREAN SECTION     x 2   LAPAROSCOPIC APPENDECTOMY  12/03/2011   Procedure: APPENDECTOMY LAPAROSCOPIC;  Surgeon: [name], DO;  Location: [facility];  Service: General;  Laterality: N/A;  drainage of intra-abdominal abscess    PARS PLANA VITRECTOMY Left 08/17/2012   Procedure: PARS PLANA VITRECTOMY WITH 25 GAUGE;  Surgeon: [name], MD;  Location: [place];  Service: Ophthalmology;  Laterality: Left;   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   Procedure: REPAIR OF COMPLEX TRACTION RETINAL DETACHMENT;  Surgeon: [name], MD;  Location: [place];  Service: Ophthalmology;  Laterality: Left;   TUBAL LIGATION     Current Outpatient Medications on File Prior to Visit  Medication Sig Dispense Refill   blood glucose meter kit and supplies KIT Use up to 2 times daily as directed. 1 each  0   Cholecalciferol (VITAMIN D-3) 125 MCG (5000 UT) TABS Take 1 tablet by mouth daily. 90 tablet 3   cyclobenzaprine (FLEXERIL) 10 MG tablet TAKE 1 TABLET (10 MG TOTAL) BY MOUTH 3 (THREE) TIMES DAILY AS NEEDED FOR MUSCLE SPASMS. 90 tablet 2   diclofenac Sodium (VOLTAREN) 1 % GEL APPLY 4 G TOPICALLY 4 (FOUR) TIMES DAILY. 300 g 2   Dulaglutide  (TRULICITY) 3 TD/1.7OH SOPN Inject 3 mg as directed once a week. (Patient not taking: Reported on 06/02/2022) 2 mL 2   fenofibrate 160 MG tablet TAKE 1 TABLET (160 MG TOTAL) BY MOUTH DAILY. 90 tablet 3   gabapentin (NEURONTIN) 300 MG capsule Take 1 capsule (300 mg total) by mouth at bedtime. 90 capsule 1   glipiZIDE (GLUCOTROL) 10 MG tablet Take 1 tablet (10 mg total) by mouth 2 (two) times daily before a meal. 180 tablet 1   glucose blood (TRUE METRIX BLOOD GLUCOSE TEST) test strip Testing twice daily. 200 each 1   hydrochlorothiazide (HYDRODIURIL) 25 MG tablet TAKE 1 TABLET (25 MG TOTAL) BY MOUTH DAILY. 90 tablet 1   Insulin Glargine (BASAGLAR KWIKPEN) 100 UNIT/ML Inject into the skin 45 units in the am and 40 units in the pm 78 mL 3   Insulin Pen Needle (B-D UF III MINI PEN NEEDLES) 31G X 5 MM MISC Use as instructed. Inject into the skin twice daily. NEEDS PASS 100 each 6   Lancets (ONETOUCH DELICA PLUS [id number]) MISC Testing twice daily 200 each 1   lisinopril (ZESTRIL) 40 MG tablet Take 1 tablet (40 mg total) by mouth daily. 90 tablet 1   methimazole (TAPAZOLE) 5 MG tablet Take 1 tablet (5 mg total) by mouth 2 (two) times daily. 60 tablet 2   nitrofurantoin, macrocrystal-monohydrate, (MACROBID) 100 MG capsule Take 1 capsule (100 mg total) by mouth 2 (two) times daily. 10 capsule 0   pantoprazole (PROTONIX) 40 MG tablet Take 1 tablet (40 mg total) by mouth 2 (two) times daily. 60 tablet 2   rosuvastatin (CRESTOR) 5 MG tablet Take 1 tablet (5 mg total) by mouth daily. 90 tablet 3   vitamin C (ASCORBIC ACID) 500 MG tablet Take 1,000 mg by mouth daily.     No current facility-administered medications on file prior to visit.    Allergies  Allergen Reactions   Latex Other (See Comments)    Infection in left middle finger from latex gloves due to moisture.   Iohexol      Code: RASH, Desc: PATIENT CALLED 06/17/08-STATED AFTER 6 HOURS POST IV CONTRAST, HANDS,SCALP AND GROIN AREA DEVELOPED  ITCHING,BURNING SENSATION, Onset Date: [id number]    Social History   Occupational History   Not on file  Tobacco Use   Smoking status: Every Day    Types: Cigars   Smokeless tobacco: Never  Vaping Use   Vaping Use: Never used  Substance and Sexual Activity   Alcohol use: Yes    Alcohol/week: 1.0 standard drink of alcohol    Types: 1 Glasses of wine per week    Comment: occasional   Drug use: No   Sexual activity: Yes    Birth control/protection: Pill   Family History  Problem Relation Age of Onset   Breast cancer Mother 26   Diabetes Mother    Hypertension Mother    Kidney failure Father    Heart disease Father        No details   Diabetes Sister    Hypertension Sister  Hyperlipidemia Sister    Breast cancer Maternal Grandmother    Hypertension Maternal Grandmother    Kidney disease Maternal Grandfather    Cancer Maternal Grandfather        type unknown   Diabetes Paternal Grandmother    Diabetes Paternal Grandfather    Diabetes Daughter    Diabetes Maternal Uncle    Cancer Maternal Uncle        type unknown   Diabetes Paternal Aunt    Hypertension Paternal Aunt    Breast cancer Paternal Aunt    Fibromyalgia Paternal Aunt    Immunization History  Administered Date(s) Administered   Influenza-Unspecified 05/09/2014     Review of Systems: Negative except as noted in the HPI.   Objective: Vitals:   06/21/22 1356  BP: (!) 157/72    Kristina Adams is a pleasant 56 y.o. female in NAD. AAO X 3.  Vascular Examination: CFT <3 seconds b/l LE. Palpable DP pulse(s) b/l LE. Palpable PT pulse(s) b/l LE. Pedal hair sparse. No pain with calf compression b/l. Nonpitting edema noted bilateral ankles.  Dermatological Examination: Pedal integument with normal turgor, texture and tone b/l LE. No open wounds b/l. No interdigital macerations b/l. Toenails 1-5 b/l elongated, thickened, discolored with subungual debris. +Tenderness with dorsal palpation of nailplates.  Hyperkeratotic lesion(s) noted submet head 1 right foot and submet head 5 b/l.  Neurological Examination: Pt has subjective symptoms of neuropathy. Protective sensation intact 5/5 intact bilaterally with 10g monofilament b/l. Vibratory sensation intact b/l.  Musculoskeletal Examination: Normal muscle strength 5/5 to all lower extremity muscle groups bilaterally. Hammertoe(s) noted to the bilateral 5th toes.. No pain, crepitus or joint limitation noted with ROM b/l LE.  Patient ambulates independently without assistive aids.  Footwear Assessment: Does the patient wear appropriate shoes? Yes. Does the patient need inserts/orthotics? Yes.  ADA Risk Categorization: Low Risk :  Patient has all of the following: Intact protective sensation No prior foot ulcer  No severe deformity Pedal pulses present  Assessment: 1. Pain due to onychomycosis of toenails of both feet   2. Callus   3. Acquired hammertoes of both feet   4. Diabetic peripheral neuropathy associated with type 2 diabetes mellitus ([place])   5. Encounter for diabetic foot exam ([place])     Plan: No orders of the defined types were placed in this encounter.  -Patient was evaluated and treated. All patient's and/or POA's questions/concerns answered on today's visit. -Diabetic foot examination performed today. -Stressed the importance of good glycemic control and the detriment of not  controlling glucose levels in relation to the foot. -Continue diabetic foot care principles: inspect feet daily, monitor glucose as recommended by PCP and/or Endocrinologist, and follow prescribed diet per PCP, Endocrinologist and/or dietician. -Patient to continue soft, supportive shoe gear daily. -Toenails 1-5 b/l were debrided in length and girth with sterile nail nippers and dremel without iatrogenic bleeding.  -Callus(es) submet head 1 right foot and submet head 5 b/l pared utilizing sterile scalpel blade without complication or incident. Total  number debrided =3. -Recommended Skechers shoes with stretchable uppers and memory foam insoles. They can be purchased at [employer], [employer] or [employer]. Also on [email or web address].  -Patient/POA to call should there be question/concern in the interim. Return in about 3 months (around 09/20/2022).  Marzetta Board, DPM

## 2022-06-25 ENCOUNTER — Encounter: Payer: Self-pay | Admitting: Hematology

## 2022-06-25 ENCOUNTER — Other Ambulatory Visit: Payer: Self-pay

## 2022-06-26 ENCOUNTER — Encounter: Payer: Self-pay | Admitting: Podiatry

## 2022-06-28 ENCOUNTER — Other Ambulatory Visit: Payer: Self-pay

## 2022-06-28 ENCOUNTER — Encounter: Payer: Self-pay | Admitting: Hematology

## 2022-06-30 ENCOUNTER — Other Ambulatory Visit: Payer: Self-pay

## 2022-07-01 ENCOUNTER — Telehealth: Payer: Self-pay | Admitting: Gastroenterology

## 2022-07-01 NOTE — Telephone Encounter (Signed)
Patient called, is requesting the results from her CT done by Dr.Wagner in November. Stated it's been a month now and has not heard from anyone.

## 2022-07-01 NOTE — Telephone Encounter (Signed)
The pt has been advised that she should call Dr Rickey Primus office for the results of the CT scan ordered by that office.

## 2022-07-06 ENCOUNTER — Other Ambulatory Visit: Payer: Self-pay | Admitting: Nurse Practitioner

## 2022-07-06 ENCOUNTER — Encounter: Payer: Self-pay | Admitting: Nurse Practitioner

## 2022-07-06 ENCOUNTER — Other Ambulatory Visit: Payer: Self-pay

## 2022-07-06 DIAGNOSIS — E0590 Thyrotoxicosis, unspecified without thyrotoxic crisis or storm: Secondary | ICD-10-CM

## 2022-07-06 DIAGNOSIS — E049 Nontoxic goiter, unspecified: Secondary | ICD-10-CM

## 2022-07-19 ENCOUNTER — Other Ambulatory Visit: Payer: Self-pay

## 2022-07-22 ENCOUNTER — Encounter (HOSPITAL_COMMUNITY): Payer: Self-pay | Admitting: Gastroenterology

## 2022-07-22 ENCOUNTER — Other Ambulatory Visit: Payer: Self-pay

## 2022-07-22 NOTE — Progress Notes (Signed)
Attempted to obtain medical history via telephone, unable to reach at this time. HIPAA compliant voicemail message left requesting return call to pre surgical testing department. 

## 2022-07-26 ENCOUNTER — Other Ambulatory Visit: Payer: Self-pay

## 2022-07-27 ENCOUNTER — Other Ambulatory Visit: Payer: Self-pay

## 2022-07-29 ENCOUNTER — Ambulatory Visit (HOSPITAL_BASED_OUTPATIENT_CLINIC_OR_DEPARTMENT_OTHER): Payer: Commercial Managed Care - HMO | Admitting: Anesthesiology

## 2022-07-29 ENCOUNTER — Ambulatory Visit (HOSPITAL_COMMUNITY)
Admission: RE | Admit: 2022-07-29 | Discharge: 2022-07-29 | Disposition: A | Discharge status: Home / Self Care | Payer: Commercial Managed Care - HMO | Source: Ambulatory Visit | Attending: Gastroenterology | Admitting: Gastroenterology

## 2022-07-29 ENCOUNTER — Encounter (HOSPITAL_COMMUNITY)
Admission: RE | Disposition: A | Discharge status: Home / Self Care | Payer: Self-pay | Source: Ambulatory Visit | Attending: Gastroenterology

## 2022-07-29 ENCOUNTER — Encounter (HOSPITAL_COMMUNITY): Payer: Self-pay | Admitting: Gastroenterology

## 2022-07-29 ENCOUNTER — Ambulatory Visit (HOSPITAL_COMMUNITY): Payer: Commercial Managed Care - HMO | Admitting: Anesthesiology

## 2022-07-29 ENCOUNTER — Other Ambulatory Visit: Payer: Self-pay

## 2022-07-29 DIAGNOSIS — K449 Diaphragmatic hernia without obstruction or gangrene: Secondary | ICD-10-CM | POA: Insufficient documentation

## 2022-07-29 DIAGNOSIS — K644 Residual hemorrhoidal skin tags: Secondary | ICD-10-CM | POA: Diagnosis not present

## 2022-07-29 DIAGNOSIS — K621 Rectal polyp: Secondary | ICD-10-CM | POA: Diagnosis not present

## 2022-07-29 DIAGNOSIS — Z1211 Encounter for screening for malignant neoplasm of colon: Secondary | ICD-10-CM | POA: Insufficient documentation

## 2022-07-29 DIAGNOSIS — K649 Unspecified hemorrhoids: Secondary | ICD-10-CM

## 2022-07-29 DIAGNOSIS — K641 Second degree hemorrhoids: Secondary | ICD-10-CM | POA: Insufficient documentation

## 2022-07-29 DIAGNOSIS — K2289 Other specified disease of esophagus: Secondary | ICD-10-CM

## 2022-07-29 DIAGNOSIS — I899 Noninfective disorder of lymphatic vessels and lymph nodes, unspecified: Secondary | ICD-10-CM | POA: Insufficient documentation

## 2022-07-29 DIAGNOSIS — K2970 Gastritis, unspecified, without bleeding: Secondary | ICD-10-CM | POA: Insufficient documentation

## 2022-07-29 DIAGNOSIS — G4730 Sleep apnea, unspecified: Secondary | ICD-10-CM | POA: Diagnosis not present

## 2022-07-29 DIAGNOSIS — R194 Change in bowel habit: Secondary | ICD-10-CM | POA: Diagnosis present

## 2022-07-29 DIAGNOSIS — I10 Essential (primary) hypertension: Secondary | ICD-10-CM

## 2022-07-29 DIAGNOSIS — E119 Type 2 diabetes mellitus without complications: Secondary | ICD-10-CM | POA: Diagnosis not present

## 2022-07-29 DIAGNOSIS — K3189 Other diseases of stomach and duodenum: Secondary | ICD-10-CM | POA: Insufficient documentation

## 2022-07-29 DIAGNOSIS — K219 Gastro-esophageal reflux disease without esophagitis: Secondary | ICD-10-CM | POA: Insufficient documentation

## 2022-07-29 DIAGNOSIS — F17210 Nicotine dependence, cigarettes, uncomplicated: Secondary | ICD-10-CM

## 2022-07-29 DIAGNOSIS — Z79899 Other long term (current) drug therapy: Secondary | ICD-10-CM | POA: Insufficient documentation

## 2022-07-29 DIAGNOSIS — D128 Benign neoplasm of rectum: Secondary | ICD-10-CM | POA: Insufficient documentation

## 2022-07-29 HISTORY — PX: COLONOSCOPY WITH PROPOFOL: SHX5780

## 2022-07-29 HISTORY — PX: ESOPHAGOGASTRODUODENOSCOPY (EGD) WITH PROPOFOL: SHX5813

## 2022-07-29 HISTORY — PX: FINE NEEDLE ASPIRATION: SHX5430

## 2022-07-29 HISTORY — PX: BIOPSY: SHX5522

## 2022-07-29 HISTORY — PX: POLYPECTOMY: SHX5525

## 2022-07-29 HISTORY — PX: EUS: SHX5427

## 2022-07-29 LAB — GLUCOSE, CAPILLARY
Glucose-Capillary: 131 mg/dL — ABNORMAL HIGH (ref 70–99)
Glucose-Capillary: 118 mg/dL — ABNORMAL HIGH (ref 70–99)

## 2022-07-29 SURGERY — ESOPHAGOGASTRODUODENOSCOPY (EGD) WITH PROPOFOL
Anesthesia: Monitor Anesthesia Care

## 2022-07-29 MED ORDER — LACTATED RINGERS IV SOLN: INTRAVENOUS | Status: DC

## 2022-07-29 MED ORDER — PROPOFOL 10 MG/ML IV BOLUS
INTRAVENOUS | Status: DC | PRN
  Administered 2022-07-29 (×2): 50 mg via INTRAVENOUS

## 2022-07-29 MED ORDER — PHENYLEPHRINE 80 MCG/ML (10ML) SYRINGE FOR IV PUSH (FOR BLOOD PRESSURE SUPPORT)
PREFILLED_SYRINGE | INTRAVENOUS | Status: DC | PRN
  Administered 2022-07-29 (×6): 160 ug via INTRAVENOUS

## 2022-07-29 MED ORDER — LIDOCAINE HCL (CARDIAC) PF 100 MG/5ML IV SOSY
PREFILLED_SYRINGE | INTRAVENOUS | Status: DC | PRN
  Administered 2022-07-29: 100 mg via INTRAVENOUS

## 2022-07-29 MED ORDER — PANTOPRAZOLE SODIUM 40 MG PO TBEC
40.0000 mg | DELAYED_RELEASE_TABLET | Freq: Two times a day (BID) | ORAL | 12 refills | Status: DC
  Filled 2022-07-29 – 2022-08-16 (×2): qty 60, 30d supply, fill #0
  Filled 2022-09-21 – 2022-09-28 (×2): qty 60, 30d supply, fill #1

## 2022-07-29 MED ORDER — PROPOFOL 500 MG/50ML IV EMUL
INTRAVENOUS | Status: DC | PRN
  Administered 2022-07-29: 140 ug/kg/min via INTRAVENOUS

## 2022-07-29 SURGICAL SUPPLY — 25 items

## 2022-07-29 NOTE — Op Note (Addendum)
[facility] Patient Name: Kristina Adams Procedure Date: 07/29/2022 MRN: [id number] Attending MD: Justice Britain , MD, [id number] Date of Birth: 1966-02-26 CSN: [id number] Age: 57 Admit Type: Outpatient Procedure:                Colonoscopy Indications:              Screening for colorectal malignant neoplasm,                            Incidental change in bowel habits noted, Incidental                            constipation noted Providers:                Justice Britain, MD, Fanny Skates RN, RN, Carlyn Reichert, RN, Darliss Cheney, Technician, Stephanie                            [place] CRNA Referring MD:              Medicines:                Monitored Anesthesia Care Complications:            No immediate complications. Estimated Blood Loss:     Estimated blood loss was minimal. Procedure:                Pre-Anesthesia Assessment:                           - Prior to the procedure, a History and Physical                            was performed, and patient medications and                            allergies were reviewed. The patient's tolerance of                            previous anesthesia was also reviewed. The risks                            and benefits of the procedure and the sedation                            options and risks were discussed with the patient.                            All questions were answered, and informed consent                            was obtained. Prior Anticoagulants: The patient has                            taken no anticoagulant or  antiplatelet agents. ASA                            Grade Assessment: III - A patient with severe                            systemic disease. After reviewing the risks and                            benefits, the patient was deemed in satisfactory                            condition to undergo the procedure.                           After obtaining informed  consent, the colonoscope                            was passed under direct vision. Throughout the                            procedure, the patient's blood pressure, pulse, and                            oxygen saturations were monitored continuously. The                            CF-HQ190L ([id number] Olympus colonoscope was                            introduced through the anus and advanced to the 3                            cm into the ileum. The colonoscopy was somewhat                            difficult due to significant looping. Successful                            completion of the procedure was aided by changing                            the patient's position, using manual pressure,                            straightening and shortening the scope to obtain                            bowel loop reduction and using scope torsion. The                            patient tolerated the procedure. The quality of the  bowel preparation was adequate. The terminal ileum,                            ileocecal valve, appendiceal orifice, and rectum                            were photographed. Scope In: 2:22:37 PM Scope Out: 2:39:51 PM Scope Withdrawal Time: 0 hours 12 minutes 37 seconds  Total Procedure Duration: 0 hours 17 minutes 14 seconds  Findings:      Skin tags were found on perianal exam.      The digital rectal exam findings include hemorrhoids. Pertinent       negatives include no palpable rectal lesions.      The colon (entire examined portion) revealed significantly excessive       looping.      The terminal ileum and ileocecal valve appeared normal.      A 14 mm polyp was found in the proximal rectum. The polyp was sessile.       The polyp was removed with a cold snare. Resection and retrieval were       complete.      Normal mucosa was found in the entire colon.      Non-bleeding non-thrombosed external and internal hemorrhoids were found        during retroflexion, during perianal exam and during digital exam. The       hemorrhoids were Grade II (internal hemorrhoids that prolapse but reduce       spontaneously). Impression:               - Perianal skin tags found on perianal exam.                           - Hemorrhoids found on digital rectal exam.                           - There was significant looping of the colon.                           - The examined portion of the ileum was normal.                           - One 14 mm polyp in the proximal rectum, removed                            with a cold snare. Resected and retrieved.                           - Normal mucosa in the entire examined colon                            otherwise.                           - Non-bleeding non-thrombosed external and internal                            hemorrhoids. Moderate Sedation:  Not Applicable - Patient had care per Anesthesia. Recommendation:           - The patient will be observed post-procedure,                            until all discharge criteria are met.                           - Discharge patient to home.                           - Patient has a contact number available for                            emergencies. The signs and symptoms of potential                            delayed complications were discussed with the                            patient. Return to normal activities tomorrow.                            Written discharge instructions were provided to the                            patient.                           - High fiber diet.                           - Increase MiraLAX to daily dosing and even up to 2                            times daily dosing.                           - Use FiberCon 1-2 tablets PO daily.                           - Continue present medications.                           - Pending patient's response to MiraLAX and fiber,                            consider  Linzess/Amitiza/Trulance/IBSrela for use                            in the future. Patient interested in potential                            bowel preparations periodically if needed which we  can consider.                           - Await pathology results.                           - Repeat colonoscopy in 3 years for surveillance.                           - The findings and recommendations were discussed                            with the patient.                           - The findings and recommendations were discussed                            with the patient's family. Procedure Code(s):        --- Professional ---                           [phone], Colonoscopy, flexible; with removal of                            tumor(s), polyp(s), or other lesion(s) by snare                            technique Diagnosis Code(s):        --- Professional ---                           Z12.11, Encounter for screening for malignant                            neoplasm of colon                           K64.1, Second degree hemorrhoids                           D12.8, Benign neoplasm of rectum                           K64.4, Residual hemorrhoidal skin tags CPT copyright 2022 American Medical Association. All rights reserved. The codes documented in this report are preliminary and upon coder review may  be revised to meet current compliance requirements. Justice Britain, MD 07/29/2022 3:00:15 PM Number of Addenda: 0

## 2022-07-29 NOTE — Anesthesia Preprocedure Evaluation (Addendum)
Anesthesia Evaluation  Patient identified by MRN, date of birth, ID band Patient awake    Reviewed: Allergy & Precautions, NPO status , Patient's Chart, lab work & pertinent test results  Airway Mallampati: II  TM Distance: >3 FB Neck ROM: Full    Dental  (+) Dental Advisory Given   Pulmonary sleep apnea , Current Smoker   Pulmonary exam normal breath sounds clear to auscultation       Cardiovascular hypertension, Normal cardiovascular exam Rhythm:Regular Rate:Normal     Neuro/Psych  PSYCHIATRIC DISORDERS Anxiety Depression     Neuromuscular disease    GI/Hepatic Neg liver ROS, hiatal hernia,GERD  ,,  Endo/Other  diabetes Hyperthyroidism   Renal/GU negative Renal ROS     Musculoskeletal  (+) Arthritis ,    Abdominal  (+) + obese  Peds  Hematology  (+) Blood dyscrasia, anemia   Anesthesia Other Findings   Reproductive/Obstetrics                             Anesthesia Physical Anesthesia Plan  ASA: 3  Anesthesia Plan: MAC   Post-op Pain Management: Minimal or no pain anticipated   Induction: Intravenous  PONV Risk Score and Plan: Treatment may vary due to age or medical condition, Propofol infusion and TIVA  Airway Management Planned: Natural Airway  Additional Equipment:   Intra-op Plan:   Post-operative Plan:   Informed Consent: I have reviewed the patients History and Physical, chart, labs and discussed the procedure including the risks, benefits and alternatives for the proposed anesthesia with the patient or authorized representative who has indicated his/her understanding and acceptance.     Dental advisory given  Plan Discussed with: CRNA  Anesthesia Plan Comments:        Anesthesia Quick Evaluation

## 2022-07-29 NOTE — Discharge Instructions (Signed)
YOU HAD AN ENDOSCOPIC PROCEDURE TODAY: Refer to the procedure report and other information in the discharge instructions given to you for any specific questions about what was found during the examination. If this information does not answer your questions, please call [place] office at [phone] to clarify.   YOU SHOULD EXPECT: Some feelings of bloating in the abdomen. Passage of more gas than usual. Walking can help get rid of the air that was put into your GI tract during the procedure and reduce the bloating. If you had a lower endoscopy (such as a colonoscopy or flexible sigmoidoscopy) you may notice spotting of blood in your stool or on the toilet paper. Some abdominal soreness may be present for a day or two, also.  DIET: Your first meal following the procedure should be a light meal and then it is ok to progress to your normal diet. A half-sandwich or bowl of soup is an example of a good first meal. Heavy or fried foods are harder to digest and may make you feel nauseous or bloated. Drink plenty of fluids but you should avoid alcoholic beverages for 24 hours. If you had a esophageal dilation, please see attached instructions for diet.    ACTIVITY: Your care partner should take you home directly after the procedure. You should plan to take it easy, moving slowly for the rest of the day. You can resume normal activity the day after the procedure however YOU SHOULD NOT DRIVE, use power tools, machinery or perform tasks that involve climbing or major physical exertion for 24 hours (because of the sedation medicines used during the test).   SYMPTOMS TO REPORT IMMEDIATELY: A gastroenterologist can be reached at any hour. Please call [phone]  for any of the following symptoms:  Following lower endoscopy (colonoscopy, flexible sigmoidoscopy) Excessive amounts of blood in the stool  Significant tenderness, worsening of abdominal pains  Swelling of the abdomen that is new, acute  Fever of 100 or  higher  Following upper endoscopy (EGD, EUS, ERCP, esophageal dilation) Vomiting of blood or coffee ground material  New, significant abdominal pain  New, significant chest pain or pain under the shoulder blades  Painful or persistently difficult swallowing  New shortness of breath  Black, tarry-looking or red, bloody stools  FOLLOW UP:  If any biopsies were taken you will be contacted by phone or by letter within the next 1-3 weeks. Call [phone]  if you have not heard about the biopsies in 3 weeks.  Please also call with any specific questions about appointments or follow up tests.YOU HAD AN ENDOSCOPIC PROCEDURE TODAY: Refer to the procedure report and other information in the discharge instructions given to you for any specific questions about what was found during the examination. If this information does not answer your questions, please call [place] office at [phone] to clarify.   YOU SHOULD EXPECT: Some feelings of bloating in the abdomen. Passage of more gas than usual. Walking can help get rid of the air that was put into your GI tract during the procedure and reduce the bloating. If you had a lower endoscopy (such as a colonoscopy or flexible sigmoidoscopy) you may notice spotting of blood in your stool or on the toilet paper. Some abdominal soreness may be present for a day or two, also.  DIET: Your first meal following the procedure should be a light meal and then it is ok to progress to your normal diet. A half-sandwich or bowl of soup is an example of a   good first meal. Heavy or fried foods are harder to digest and may make you feel nauseous or bloated. Drink plenty of fluids but you should avoid alcoholic beverages for 24 hours. If you had a esophageal dilation, please see attached instructions for diet.    ACTIVITY: Your care partner should take you home directly after the procedure. You should plan to take it easy, moving slowly for the rest of the day. You can resume  normal activity the day after the procedure however YOU SHOULD NOT DRIVE, use power tools, machinery or perform tasks that involve climbing or major physical exertion for 24 hours (because of the sedation medicines used during the test).   SYMPTOMS TO REPORT IMMEDIATELY: A gastroenterologist can be reached at any hour. Please call [phone]  for any of the following symptoms:  Following lower endoscopy (colonoscopy, flexible sigmoidoscopy) Excessive amounts of blood in the stool  Significant tenderness, worsening of abdominal pains  Swelling of the abdomen that is new, acute  Fever of 100 or higher  Following upper endoscopy (EGD, EUS, ERCP, esophageal dilation) Vomiting of blood or coffee ground material  New, significant abdominal pain  New, significant chest pain or pain under the shoulder blades  Painful or persistently difficult swallowing  New shortness of breath  Black, tarry-looking or red, bloody stools  FOLLOW UP:  If any biopsies were taken you will be contacted by phone or by letter within the next 1-3 weeks. Call [phone]  if you have not heard about the biopsies in 3 weeks.  Please also call with any specific questions about appointments or follow up tests. 

## 2022-07-29 NOTE — H&P (Signed)
GASTROENTEROLOGY PROCEDURE H&P NOTE   Primary Care Physician: Gildardo Pounds, NP  HPI: Kristina Adams is a 57 y.o. female who presents for EGD/EUS to evaluate esophageal SEL and colonoscopy for evaluation of changes in bowel habits.  Past Medical History:  Diagnosis Date   Anemia 12/09/2011   child birth   Anxiety    Appendicitis, acute 12/03/2011   Arthritis    Chronic back pain 12/03/2011   Degenerative disc disease, cervical    low back area   Diabetes mellitus type 2, insulin dependent ([place]) 12/03/2011   GERD (gastroesophageal reflux disease)    H/O hiatal hernia    HLD (hyperlipidemia)    Hypertension 12/03/2011   Hyperthyroidism    IBS (irritable bowel syndrome)    Morbid obesity with BMI of 45.0-49.9, adult ([place]) 12/03/2011   Sleep apnea 12/03/2011   last sleep study May 2013   Past Surgical History:  Procedure Laterality Date   CESAREAN SECTION     x 2   LAPAROSCOPIC APPENDECTOMY  12/03/2011   Procedure: APPENDECTOMY LAPAROSCOPIC;  Surgeon: [name], DO;  Location: [facility];  Service: General;  Laterality: N/A;  drainage of intra-abdominal abscess    PARS PLANA VITRECTOMY Left 08/17/2012   Procedure: PARS PLANA VITRECTOMY WITH 25 GAUGE;  Surgeon: [name], MD;  Location: [place];  Service: Ophthalmology;  Laterality: Left;   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   No current facility-administered medications for this encounter. Allergies  Allergen Reactions   Latex Other (See Comments)    Infection in left middle finger from latex gloves due to moisture.   Iohexol      Code: RASH, Desc: PATIENT CALLED 06/17/08-STATED AFTER 6 HOURS POST IV CONTRAST, HANDS,SCALP AND GROIN AREA DEVELOPED  ITCHING,BURNING SENSATION, Onset Date: [id number]    Family History  Problem Relation Age of Onset   Breast cancer Mother 77   Diabetes Mother    Hypertension Mother    Kidney failure Father    Heart disease Father        No details   Diabetes Sister    Hypertension Sister    Hyperlipidemia Sister    Breast cancer Maternal Grandmother    Hypertension Maternal Grandmother    Kidney disease Maternal Grandfather    Cancer Maternal Grandfather        type unknown   Diabetes Paternal Grandmother    Diabetes Paternal Grandfather    Diabetes Daughter    Diabetes Maternal Uncle    Cancer Maternal Uncle        type unknown   Diabetes Paternal Aunt    Hypertension Paternal Aunt    Breast cancer Paternal Aunt    Fibromyalgia Paternal Aunt    Social History   Socioeconomic History   Marital status: Divorced    Spouse name: Not on file   Number of children: 2   Years of education: Not on file   Highest education level: Not on file  Occupational History   Not on file  Tobacco Use   Smoking status: Every Day    Types: Cigars   Smokeless tobacco: Never  Vaping Use   Vaping Use: Never used  Substance and Sexual Activity   Alcohol use: Yes  Alcohol/week: 1.0 standard drink of alcohol    Types: 1 Glasses of wine per week    Comment: occasional   Drug use: No   Sexual activity: Yes    Birth control/protection: Pill  Other Topics Concern   Not on file  Social History Narrative   Lives alone.    Social Determinants of Health   Financial Resource Strain: Not on file  Food Insecurity: Not on file  Transportation Needs: Not on file  Physical Activity: Not on file  Stress: Not on file  Social Connections: Not on file  Intimate Partner Violence: Not on file    Physical Exam: Today's Vitals   07/22/22 1626  Weight: (!) 138.8 kg   Body mass index is 43.91 kg/m. GEN: NAD EYE: Sclerae anicteric ENT: MMM CV: Non-tachycardic GI: Soft, NT/ND NEURO:  Alert & Oriented x  3  Lab Results: No results for input(s): "WBC", "HGB", "HCT", "PLT" in the last 72 hours. BMET No results for input(s): "NA", "K", "CL", "CO2", "GLUCOSE", "BUN", "CREATININE", "CALCIUM" in the last 72 hours. LFT No results for input(s): "PROT", "ALBUMIN", "AST", "ALT", "ALKPHOS", "BILITOT", "BILIDIR", "IBILI" in the last 72 hours. PT/INR No results for input(s): "LABPROT", "INR" in the last 72 hours.   Impression / Plan: This is a 57 y.o.female who presents for EGD/EUS to evaluate esophageal SEL and colonoscopy for evaluation of changes in bowel habits.  The risks and benefits of endoscopic evaluation/treatment were discussed with the patient and/or family; these include but are not limited to the risk of perforation, infection, bleeding, missed lesions, lack of diagnosis, severe illness requiring hospitalization, as well as anesthesia and sedation related illnesses.  The patient's history has been reviewed, patient examined, no change in status, and deemed stable for procedure.  The patient and/or family is agreeable to proceed.    Justice Britain, MD [facility] Office # [phone]

## 2022-07-29 NOTE — Op Note (Addendum)
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 Estimated Blood Loss:     Estimated blood loss was minimal. Procedure:                Pre-Anesthesia Assessment:                           - Prior to the procedure, a History and Physical                            was performed, and patient medications and                            allergies were reviewed. The patient's tolerance of                            previous anesthesia was also reviewed. The risks                            and benefits of the procedure and the sedation                            options and risks were discussed with the patient.                            All questions were answered, and informed consent                            was obtained. Prior Anticoagulants: The patient has                            taken no anticoagulant or antiplatelet agents  except for aspirin. ASA Grade Assessment: III - A                            patient with severe systemic disease. After                            reviewing the risks and benefits, the patient was                            deemed in satisfactory condition to undergo the                            procedure.                           After obtaining informed  consent, the endoscope was                            passed under direct vision. Throughout the                            procedure, the patient's blood pressure, pulse, and                            oxygen saturations were monitored continuously. The                            [id number]-H190 ([id number]) Olympus endoscope was introduced                            through the mouth, and advanced to the second part                            of duodenum. The GF-UE190-AL5 ([id number]) Olympus                            radial ultrasound scope was introduced through the                            mouth, and advanced to the stomach for ultrasound                            examination from the esophagus and stomach. The                            GF-[id number] ([id number]) Olympus linear ultrasound scope                            was introduced through the mouth, and advanced to                            the esophagus for ultrasound examination. After  obtaining informed consent, the endoscope was                            passed under direct vision. Throughout the                            procedure, the patient's blood pressure, pulse, and                            oxygen saturations were monitored continuously.The                            upper EUS was accomplished without difficulty. The                            patient tolerated the procedure. Scope In: Scope Out: Findings:      ENDOSCOPIC FINDING: :      No gross lesions were noted in the proximal esophagus and in the mid       esophagus.      A single 15 mm subepithelial nodule was found in the distal esophagus,       40 cm from the incisors.      The Z-line was regular and was found 45 cm from the incisors.      A 2 cm hiatal hernia was present.      Patchy mildly erythematous mucosa without bleeding was found in the       entire examined stomach. Biopsies were taken with a cold forceps for       histology and  Helicobacter pylori testing.      No gross lesions were noted in the duodenal bulb, in the first portion       of the duodenum and in the second portion of the duodenum. Biopsies were       taken with a cold forceps for histology.      ENDOSONOGRAPHIC FINDING: :      An oval intramural (subepithelial) lesion was found in the lower third       of the esophagus. It was encountered at 40 cm from the incisors and       extended to 42 cm. The lesion was hypoechoic. Sonographically, the       origin appeared to be within the deep mucosa (Layer 2). The mass       measured up to 17 mm in thickness. The endosonographic borders were       well-defined. Fine needle biopsy was performed. Color Doppler imaging       was utilized prior to needle puncture to confirm a lack of significant       vascular structures within the needle path. Six passes were made with       the 22 gauge Acquire biopsy needle using a transesophageal approach. A       visible core of tissue was obtained. Final cytology results are pending.      Endosonographic imaging in the visualized portion of the liver showed no       mass.      No malignant-appearing lymph nodes were visualized in the middle       paraesophageal mediastinum (level 49M), lower paraesophageal mediastinum       (level 8L) and paracardial region (level 16).  The celiac region was visualized. Impression:               EGD impression:                           - No gross lesions in the proximal esophagus and in                            the mid esophagus. Subepithelial nodule found in                            the distal esophagus. Z-line regular, 45 cm from                            the incisors.                           - 2 cm hiatal hernia.                           - Erythematous mucosa in the stomach. Biopsied.                           - No gross lesions in the duodenal bulb, in the                            first portion of the duodenum and in  the second                            portion of the duodenum. Biopsied.                           EUS impression:                           - An intramural (subepithelial) lesion was found in                            the lower third of the esophagus. It appeared to                            originate from within the deep mucosa (Layer 2).                            Cytology results are pending. However, the                            endosonographic appearance is suspicious for a                            granular cell tumor versus leiomyoma versus                            esophageal GIST. Fine needle biopsy performed.  Compared to prior EUS from [facility] at [facility],                            looks like the size has not drastically changed in                            years.                           - No malignant-appearing lymph nodes were                            visualized in the middle paraesophageal mediastinum                            (level 51M), lower paraesophageal mediastinum (level                            8L) and paracardial region (level 16). Moderate Sedation:      Not Applicable - Patient had care per Anesthesia. Recommendation:           - Proceed to scheduled colonoscopy.                           - Await cytology results and await path results.                           - Continue present medications.                           - Follow-up to be dictated based on results of                            cytology.                           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                           - The findings and recommendations were discussed                            with the patient.                           - The findings and recommendations were discussed                            with the patient's family. Procedure Code(s):         --- Professional ---                           [phone], Esophagogastroduodenoscopy, flexible,  transoral; with transendoscopic ultrasound-guided                            intramural or transmural fine needle                            aspiration/biopsy(s), (includes endoscopic                            ultrasound examination limited to the esophagus,                            stomach or duodenum, and adjacent structures)                           43239, 59, Esophagogastroduodenoscopy, flexible,                            transoral; with biopsy, single or multiple Diagnosis Code(s):        --- Professional ---                           K22.89, Other specified disease of esophagus                           K44.9, Diaphragmatic hernia without obstruction or                            gangrene                           K31.89, Other diseases of stomach and duodenum                           I89.9, Noninfective disorder of lymphatic vessels                            and lymph nodes, unspecified CPT copyright 2022 American [employer] Association. All rights reserved. The codes documented in this report are preliminary and upon coder review may  be revised to meet current compliance requirements. Justice Britain, MD 07/29/2022 2:53:49 PM Number of Addenda: 0

## 2022-07-29 NOTE — Transfer of Care (Signed)
Immediate Anesthesia Transfer of Care Note  Patient: Kristina Adams  Procedure(s) Performed: UPPER ENDOSCOPIC ULTRASOUND (EUS) RADIAL COLONOSCOPY WITH PROPOFOL ESOPHAGOGASTRODUODENOSCOPY (EGD) WITH PROPOFOL BIOPSY FINE NEEDLE ASPIRATION (FNA) LINEAR UPPER ENDOSCOPIC ULTRASOUND (EUS) LINEAR POLYPECTOMY  Patient Location: PACU  Anesthesia Type:MAC  Level of Consciousness: sedated  Airway & Oxygen Therapy: Patient Spontanous Breathing and Patient connected to face mask oxygen  Post-op Assessment: Report given to RN and Post -op Vital signs reviewed and stable  Post vital signs: Reviewed and stable  Last Vitals:  Vitals Value Taken Time  BP    Temp    Pulse    Resp    SpO2      Last Pain: There were no vitals filed for this visit.       Complications: No notable events documented.

## 2022-07-30 NOTE — Anesthesia Postprocedure Evaluation (Signed)
Anesthesia Post Note  Patient: Kristina Adams  Procedure(s) Performed: UPPER ENDOSCOPIC ULTRASOUND (EUS) RADIAL COLONOSCOPY WITH PROPOFOL ESOPHAGOGASTRODUODENOSCOPY (EGD) WITH PROPOFOL BIOPSY FINE NEEDLE ASPIRATION (FNA) LINEAR UPPER ENDOSCOPIC ULTRASOUND (EUS) LINEAR POLYPECTOMY     Patient location during evaluation: Endoscopy Anesthesia Type: MAC Level of consciousness: awake and alert Pain management: pain level controlled Vital Signs Assessment: post-procedure vital signs reviewed and stable Respiratory status: spontaneous breathing Cardiovascular status: stable Anesthetic complications: no   No notable events documented.  Last Vitals:  Vitals:   07/29/22 1500 07/29/22 1505  BP: (!) 133/55 (!) 155/64  Pulse: 71 69  Resp: 16 18  Temp:    SpO2: 99%     Last Pain:  Vitals:   07/29/22 1505  TempSrc:   PainSc: 0-No pain                 Nolon Nations

## 2022-08-01 ENCOUNTER — Encounter (HOSPITAL_COMMUNITY): Payer: Self-pay | Admitting: Gastroenterology

## 2022-08-03 ENCOUNTER — Other Ambulatory Visit: Payer: Self-pay

## 2022-08-04 LAB — CYTOLOGY - NON PAP

## 2022-08-05 ENCOUNTER — Encounter: Payer: Self-pay | Admitting: Gastroenterology

## 2022-08-05 ENCOUNTER — Telehealth: Payer: Self-pay | Admitting: Gastroenterology

## 2022-08-05 LAB — SURGICAL PATHOLOGY

## 2022-08-05 NOTE — Telephone Encounter (Signed)
Inbound call from patient requesting call from Dr. Rush Landmark. Patient stated that she had procedure with him on 1/18 at [facility] and has some questions regarding the procedure. Please advise.

## 2022-08-05 NOTE — Telephone Encounter (Signed)
The pt has been advised that she will be contacted as soon as Dr Rush Landmark reviews the path report.  The pt has been advised of the information and verbalized understanding.

## 2022-08-09 ENCOUNTER — Other Ambulatory Visit: Payer: Self-pay

## 2022-08-09 MED ORDER — TALICIA 250-12.5-10 MG PO CPDR
4.0000 | DELAYED_RELEASE_CAPSULE | Freq: Three times a day (TID) | ORAL | 0 refills | Status: DC
  Filled 2022-08-09: qty 168, 14d supply, fill #0

## 2022-08-11 ENCOUNTER — Other Ambulatory Visit: Payer: Self-pay

## 2022-08-12 ENCOUNTER — Other Ambulatory Visit: Payer: Self-pay

## 2022-08-16 ENCOUNTER — Other Ambulatory Visit: Payer: Self-pay

## 2022-08-16 ENCOUNTER — Telehealth: Payer: Self-pay | Admitting: Gastroenterology

## 2022-08-16 MED ORDER — DOXYCYCLINE HYCLATE 100 MG PO TABS
100.0000 mg | ORAL_TABLET | Freq: Two times a day (BID) | ORAL | 0 refills | Status: AC
  Filled 2022-08-16 – 2022-08-18 (×2): qty 28, 14d supply, fill #0

## 2022-08-16 MED ORDER — METRONIDAZOLE 250 MG PO TABS
250.0000 mg | ORAL_TABLET | Freq: Four times a day (QID) | ORAL | 0 refills | Status: AC
  Filled 2022-08-16 – 2022-08-18 (×2): qty 56, 14d supply, fill #0

## 2022-08-16 MED ORDER — BISMUTH SUBSALICYLATE 262 MG PO TABS
1.0000 | ORAL_TABLET | Freq: Four times a day (QID) | ORAL | 0 refills | Status: AC
  Filled 2022-08-16: qty 112, 28d supply, fill #0
  Filled 2022-08-18: qty 120, 30d supply, fill #0

## 2022-08-16 NOTE — Telephone Encounter (Signed)
Talicia too expensive- alternative sent to the pharmacy.  Pt aware

## 2022-08-16 NOTE — Telephone Encounter (Signed)
Inbound call from patient stating she needs another proscription for Kristina Adams so her insurance will pay for it. Please advise.

## 2022-08-19 ENCOUNTER — Other Ambulatory Visit: Payer: Self-pay

## 2022-08-20 ENCOUNTER — Other Ambulatory Visit: Payer: Self-pay

## 2022-09-07 ENCOUNTER — Other Ambulatory Visit: Payer: Self-pay

## 2022-09-07 ENCOUNTER — Ambulatory Visit: Payer: Commercial Managed Care - HMO | Admitting: Nurse Practitioner

## 2022-09-13 ENCOUNTER — Encounter: Payer: Self-pay | Admitting: Hematology

## 2022-09-16 ENCOUNTER — Encounter: Payer: Self-pay | Admitting: Hematology

## 2022-09-20 ENCOUNTER — Ambulatory Visit: Payer: Self-pay | Admitting: Nurse Practitioner

## 2022-09-21 ENCOUNTER — Other Ambulatory Visit: Payer: Self-pay | Admitting: Family Medicine

## 2022-09-21 ENCOUNTER — Encounter: Payer: Self-pay | Admitting: Hematology

## 2022-09-21 ENCOUNTER — Other Ambulatory Visit: Payer: Self-pay

## 2022-09-21 DIAGNOSIS — E0590 Thyrotoxicosis, unspecified without thyrotoxic crisis or storm: Secondary | ICD-10-CM

## 2022-09-21 MED ORDER — METHIMAZOLE 5 MG PO TABS
5.0000 mg | ORAL_TABLET | Freq: Two times a day (BID) | ORAL | 0 refills | Status: DC
  Filled 2022-09-21 – 2022-10-19 (×3): qty 60, 30d supply, fill #0

## 2022-09-21 NOTE — Telephone Encounter (Signed)
Requested medication (s) are due for refill today - yes  Requested medication (s) are on the active medication list -yes  Future visit scheduled -yes  Last refill: 06/02/22 #60 2RF  Notes to clinic: non delegated Rx  Requested Prescriptions  Pending Prescriptions Disp Refills   methimazole (TAPAZOLE) 5 MG tablet 60 tablet 2    Sig: Take 1 tablet (5 mg total) by mouth 2 (two) times daily.     Not Delegated - Endocrinology:  Hyperthyroid Agents Failed - 09/21/2022 12:44 PM      Failed - This refill cannot be delegated      Failed - TSH in normal range and within 180 days    TSH  Date Value Ref Range Status  11/27/2021 0.946 0.450 - 4.500 uIU/mL Final         Failed - T3 Total in normal range and within 180 days    No results found for: "T3TOTAL", "T3FREE"       Failed - T4 free in normal range and within 180 days    T4, Total  Date Value Ref Range Status  11/27/2021 7.3 4.5 - 12.0 ug/dL Final         Passed - Valid encounter within last 6 months    Recent Outpatient Visits           3 months ago Type 2 diabetes mellitus with hyperglycemia, with long-term current use of insulin ([place])   [place], [place] W, NP   6 months ago Type 2 diabetes mellitus with hyperglycemia, with long-term current use of insulin [facility])   [place], [place] W, NP   8 months ago Acute cystitis without hematuria   [place] Gildardo Pounds, NP   9 months ago Essential hypertension   [place], Vernia Buff, NP   1 year ago Encounter for Papanicolaou smear for cervical cancer screening   [place] Gildardo Pounds, NP       Future Appointments             In 4 weeks Gildardo Pounds, NP [place]   In 1 month Gildardo Pounds, NP [place]   In 6 months Shamleffer, Melanie Crazier, MD [facility] Endocrinology               Requested Prescriptions  Pending Prescriptions Disp Refills   methimazole (TAPAZOLE) 5 MG tablet 60 tablet 2    Sig: Take 1 tablet (5 mg total) by mouth 2 (two) times daily.     Not Delegated - Endocrinology:  Hyperthyroid Agents Failed - 09/21/2022 12:44 PM      Failed - This refill cannot be delegated      Failed - TSH in normal range and within 180 days    TSH  Date Value Ref Range Status  11/27/2021 0.946 0.450 - 4.500 uIU/mL Final         Failed - T3 Total in normal range and within 180 days    No results found for: "T3TOTAL", "T3FREE"       Failed - T4 free in normal range and within 180 days    T4, Total  Date Value Ref Range Status  11/27/2021 7.3 4.5 - 12.0 ug/dL Final  Passed - Valid encounter within last 6 months    Recent Outpatient Visits           3 months ago Type 2 diabetes mellitus with hyperglycemia, with long-term current use of insulin [facility])   [place], [place] W, NP   6 months ago Type 2 diabetes mellitus with hyperglycemia, with long-term current use of insulin [facility])   [place], [place] W, NP   8 months ago Acute cystitis without hematuria   [place] Gildardo Pounds, NP   9 months ago Essential hypertension   [place] Gildardo Pounds, NP   1 year ago Encounter for Papanicolaou smear for cervical cancer screening   [place] Gildardo Pounds, NP       Future Appointments             In 4 weeks Gildardo Pounds, NP [place]   In 1 month Gildardo Pounds, NP [place]   In 6 months Shamleffer, Melanie Crazier, MD [facility] Endocrinology

## 2022-09-22 ENCOUNTER — Other Ambulatory Visit: Payer: Self-pay

## 2022-09-22 ENCOUNTER — Other Ambulatory Visit (HOSPITAL_COMMUNITY): Payer: Self-pay

## 2022-09-27 ENCOUNTER — Other Ambulatory Visit: Payer: Self-pay

## 2022-09-28 ENCOUNTER — Other Ambulatory Visit: Payer: Self-pay

## 2022-09-29 ENCOUNTER — Encounter: Payer: Self-pay | Admitting: Hematology

## 2022-10-05 ENCOUNTER — Ambulatory Visit: Payer: Commercial Managed Care - HMO | Admitting: Podiatry

## 2022-10-06 ENCOUNTER — Ambulatory Visit: Payer: Commercial Managed Care - HMO | Admitting: Nurse Practitioner

## 2022-10-11 ENCOUNTER — Other Ambulatory Visit: Payer: Self-pay

## 2022-10-11 ENCOUNTER — Encounter: Payer: Self-pay | Admitting: Hematology

## 2022-10-12 ENCOUNTER — Encounter: Payer: Self-pay | Admitting: Gastroenterology

## 2022-10-12 ENCOUNTER — Ambulatory Visit (INDEPENDENT_AMBULATORY_CARE_PROVIDER_SITE_OTHER): Payer: BLUE CROSS/BLUE SHIELD | Admitting: Gastroenterology

## 2022-10-12 ENCOUNTER — Other Ambulatory Visit: Payer: Self-pay

## 2022-10-12 ENCOUNTER — Encounter: Payer: Self-pay | Admitting: Hematology

## 2022-10-12 ENCOUNTER — Other Ambulatory Visit (INDEPENDENT_AMBULATORY_CARE_PROVIDER_SITE_OTHER): Payer: Medicaid Other

## 2022-10-12 VITALS — BP 138/86 | HR 75 | Ht 70.0 in | Wt 312.0 lb

## 2022-10-12 DIAGNOSIS — K219 Gastro-esophageal reflux disease without esophagitis: Secondary | ICD-10-CM | POA: Diagnosis not present

## 2022-10-12 DIAGNOSIS — Z8619 Personal history of other infectious and parasitic diseases: Secondary | ICD-10-CM | POA: Diagnosis not present

## 2022-10-12 DIAGNOSIS — R300 Dysuria: Secondary | ICD-10-CM

## 2022-10-12 DIAGNOSIS — K5900 Constipation, unspecified: Secondary | ICD-10-CM

## 2022-10-12 DIAGNOSIS — K229 Disease of esophagus, unspecified: Secondary | ICD-10-CM

## 2022-10-12 LAB — URINALYSIS WITH CULTURE, IF INDICATED
Bilirubin Urine: NEGATIVE
Hgb urine dipstick: NEGATIVE
Ketones, ur: NEGATIVE
Leukocytes,Ua: NEGATIVE
Nitrite: NEGATIVE
RBC / HPF: NONE SEEN (ref 0–?)
Specific Gravity, Urine: 1.02 (ref 1.000–1.030)
Total Protein, Urine: NEGATIVE
Urine Glucose: >=1000 — AB
Urobilinogen, UA: 0.2 (ref 0.0–1.0)
WBC, UA: NONE SEEN (ref 0–?)
pH: 6 (ref 5.0–8.0)

## 2022-10-12 MED ORDER — PANTOPRAZOLE SODIUM 40 MG PO TBEC
40.0000 mg | DELAYED_RELEASE_TABLET | Freq: Two times a day (BID) | ORAL | 12 refills | Status: DC
  Filled 2022-10-12 – 2022-10-19 (×2): qty 60, 30d supply, fill #0

## 2022-10-12 NOTE — Patient Instructions (Addendum)
Your provider has ordered "[employer]" stool testing for you. You have received a kit from our office today containing all necessary supplies to complete this test. Please carefully read the stool collection instructions provided in the kit before opening the accompanying materials. In addition, be sure to place the label from the top left corner of the laboratory request sheet onto the "puritan opti-swab" tube that is supplied in the kit. This label should include your full name and date of birth. After completing the test, you should secure the purtian tube into the specimen biohazard bag. The laboratory request information sheet (including date and time of specimen collection) should be placed into the outside pocket of the specimen biohazard bag and returned to the [place] lab with 2 days of collection.   Toileting tips to help with your constipation - Drink at least 64-80 ounces of water/liquid per day. - Establish a time to try to move your bowels every day.  For many people, this is after a cup of coffee or after a meal such as breakfast. - Sit all of the way back on the toilet keeping your back fairly straight and while sitting up, try to rest the tops of your forearms on your upper thighs.   - Raising your feet with a step stool/squatty potty can be helpful to improve the angle that allows your stool to pass through the rectum. - Relax the rectum feeling it bulge toward the toilet water.  If you feel your rectum raising toward your body, you are contracting rather than relaxing. - Breathe in and slowly exhale. "Belly breath" by expanding your belly towards your belly button. Keep belly expanded as you gently direct pressure down and back to the anus.  A low pitched GRRR sound can assist with increasing intra-abdominal pressure.  - Repeat 3-4 times. If unsuccessful, contract the pelvic floor to restore normal tone and get off the toilet.  Avoid excessive straining. - To reduce excessive wiping by  teaching your anus to normally contract, place hands on outer aspect of knees and resist knee movement outward.  Hold 5-10 second then place hands just inside of knees and resist inward movement of knees.  Hold 5 seconds.  Repeat a few times each way.  A high fiber diet with plenty of fluids (up to 8 glasses of water daily) is suggested to relieve these symptoms.  Fiber Con - 1 tablespoon once or twice daily can be used to keep bowels regular if needed.  We have sent the following medications to your pharmacy for you to pick up at your convenience: Pantoprazole   We have given you samples of the following medication to take: Linzess 72 mcg -1 pill once daily.   Your provider has requested that you go to the basement level for lab work before leaving today. Press "B" on the elevator. The lab is located at the first door on the left as you exit the elevator.  _______________________________________________________  If your blood pressure at your visit was 140/90 or greater, please contact your primary care physician to follow up on this.  _______________________________________________________  If you are age 57 or older, your body mass index should be between 23-30. Your Body mass index is 44.77 kg/m. If this is out of the aforementioned range listed, please consider follow up with your Primary Care Provider.  If you are age 57 or younger, your body mass index should be between 19-25. Your Body mass index is 44.77 kg/m. If this is out  of the aformentioned range listed, please consider follow up with your Primary Care Provider.   ________________________________________________________  The [place] [facility] providers would like to encourage you to use [facility] to communicate with providers for non-urgent requests or questions.  Due to long hold times on the telephone, sending your provider a message by [facility] may be a faster and more efficient way to get a response.  Please allow 48 business hours  for a response.  Please remember that this is for non-urgent requests.  _______________________________________________________  Thank you for choosing me and [facility].  Dr. Rush Landmark   We have sent the following medications to your pharmacy for you to pick up at your convenience:

## 2022-10-12 NOTE — Progress Notes (Signed)
GASTROENTEROLOGY OUTPATIENT CLINIC VISIT   Primary Care Provider Claiborne Rigg, NP [address] [place] [address] [phone]   Patient Profile: Kristina Adams is a 57 y.o. 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 pylori (pending eradication), colon polyps (TA's), chronic constipation.  The patient presents to the [facility] for an evaluation and management of problem(s) noted below:  Problem List 1. History of Helicobacter infection   2. Constipation, unspecified constipation type   3. Gastroesophageal reflux disease, unspecified whether esophagitis present   4. Nodule of esophagus   5. Dysuria     History of Present Illness Please see prior GI notes for full details of HPI.  Interval History The patient returns for follow-up.  Patient has overall been doing okay.  She completed her antibiotic therapy for H. pylori.  She has symptoms of GERD pretty quickly within a few days of not taking her PPI.  She has some concern about coming off of it completely.  She denies any dysphagia symptoms.  She did not end up following up with cardiothoracic surgery in regards to her enlarging goiter.  She has a longer standing history of constipation issues.  She felt that after she took antibiotics her bowels were a bit more regulated and moving more frequently because they were softer.  After the antibiotic stopped, she can continue to go back to her chronic constipation issues.  She has not noted any blood in her stools.  She has a sensation of incomplete evacuation at times.  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  The patient has had some mild dysuria symptoms and wonders if she has a UTI.  She has not seen her doctor for a few weeks.  GI Review of Systems Positive  as above Negative for odynophagia, pain, nausea, vomiting, melena, hematochezia  Review of Systems General: Denies fevers/chills/weight loss unintentionally Cardiovascular: Denies chest pain Pulmonary: Denies shortness of breath Gastroenterological: See HPI Genitourinary: Denies darkened urine  Hematological: Denies easy bruising/bleeding Endocrine: Denies temperature intolerance Dermatological: Denies jaundice Psychological: Mood is stable   Medications Current Outpatient Medications  Medication Sig Dispense Refill   blood glucose meter kit and supplies KIT Use up to 2 times daily as directed. 1 each 0   cyclobenzaprine (FLEXERIL) 10 MG tablet TAKE 1 TABLET (10 MG TOTAL) BY MOUTH 3 (THREE) TIMES DAILY AS NEEDED FOR MUSCLE SPASMS. (Patient taking differently: Take 10 mg by mouth every other day. At bedtime) 90 tablet 2   diclofenac Sodium (VOLTAREN) 1 % GEL APPLY 4 G TOPICALLY 4 (FOUR) TIMES DAILY. (Patient taking differently: Apply 4 g topically daily as needed (Muscle spasm).) 300 g 2   fenofibrate 160 MG tablet TAKE 1 TABLET (160 MG TOTAL) BY MOUTH DAILY. 90 tablet 3   gabapentin (NEURONTIN) 300 MG capsule Take 1 capsule (300 mg total) by mouth at bedtime. 90 capsule 1   glipiZIDE (GLUCOTROL) 10 MG tablet Take 1 tablet (10 mg total) by mouth 2 (two) times daily before a meal. 180 tablet 1   glucose blood (TRUE METRIX BLOOD GLUCOSE TEST) test strip Testing twice daily. 200 each 1   hydrochlorothiazide (HYDRODIURIL) 25 MG tablet TAKE 1 TABLET (25 MG TOTAL) BY MOUTH DAILY. 90 tablet 1   Insulin Glargine (BASAGLAR KWIKPEN) 100 UNIT/ML Inject into the skin 45 units in the am  and 40 units in the pm (Patient taking differently: Inject 40-45 Units into the skin See admin instructions. Inject into the skin 45 units in the am and 40 units in the pm) 78 mL 3   Insulin Pen Needle (B-D UF III MINI PEN NEEDLES) 31G X 5 MM MISC Use as instructed. Inject into the skin twice daily. NEEDS PASS 100 each 6    Lancets (ONETOUCH DELICA PLUS LANCET33G) MISC Testing twice daily 200 each 1   lisinopril (ZESTRIL) 40 MG tablet Take 1 tablet (40 mg total) by mouth daily. 90 tablet 1   methimazole (TAPAZOLE) 5 MG tablet Take 1 tablet (5 mg total) by mouth 2 (two) times daily. 60 tablet 0   rosuvastatin (CRESTOR) 5 MG tablet Take 1 tablet (5 mg total) by mouth daily. 90 tablet 3   vitamin C (ASCORBIC ACID) 500 MG tablet Take 1,000 mg by mouth daily.     Cholecalciferol (VITAMIN D-3) 125 MCG (5000 UT) TABS Take 1 tablet by mouth daily. (Patient not taking: Reported on 10/12/2022) 90 tablet 3   pantoprazole (PROTONIX) 40 MG tablet Take 1 tablet (40 mg total) by mouth 2 (two) times daily. 60 tablet 12   No current facility-administered medications for this visit.    Allergies Allergies  Allergen Reactions   Latex Other (See Comments)    Infection in left middle finger from latex gloves due to moisture.   Iohexol      Code: RASH, Desc: PATIENT CALLED 06/17/08-STATED AFTER 6 HOURS POST IV CONTRAST, HANDS,SCALP AND GROIN AREA DEVELOPED ITCHING,BURNING SENSATION, Onset Date: [id number]     Histories Past Medical History:  Diagnosis Date   Anemia 12/09/2011   child birth   Anxiety    Appendicitis, acute 12/03/2011   Arthritis    Chronic back pain 12/03/2011   Degenerative disc disease, cervical    low back area   Diabetes mellitus type 2, insulin dependent 12/03/2011   GERD (gastroesophageal reflux disease)    H/O hiatal hernia    HLD (hyperlipidemia)    Hypertension 12/03/2011   Hyperthyroidism    IBS (irritable bowel syndrome)    Morbid obesity with BMI of 45.0-49.9, adult 12/03/2011   Sleep apnea 12/03/2011   last sleep study May 2013   Past Surgical History:  Procedure Laterality Date   BIOPSY  07/29/2022   Procedure: BIOPSY;  Surgeon: Lemar Lofty., MD;  Location: [facility];  Service: Gastroenterology;;   CESAREAN SECTION     x 2   COLONOSCOPY WITH PROPOFOL N/A 07/29/2022    Procedure: COLONOSCOPY WITH PROPOFOL;  Surgeon: Lemar Lofty., MD;  Location: [facility];  Service: Gastroenterology;  Laterality: N/A;   ESOPHAGOGASTRODUODENOSCOPY (EGD) WITH PROPOFOL N/A 07/29/2022   Procedure: ESOPHAGOGASTRODUODENOSCOPY (EGD) WITH PROPOFOL;  Surgeon: Meridee Score Netty Starring., MD;  Location: [facility];  Service: Gastroenterology;  Laterality: N/A;   EUS N/A 07/29/2022   Procedure: UPPER ENDOSCOPIC ULTRASOUND (EUS) LINEAR;  Surgeon: Lemar Lofty., MD;  Location: [facility];  Service: Gastroenterology;  Laterality: N/A;   FINE NEEDLE ASPIRATION N/A 07/29/2022   Procedure: FINE NEEDLE ASPIRATION (FNA) LINEAR;  Surgeon: Lemar Lofty., MD;  Location: [facility];  Service: Gastroenterology;  Laterality: N/A;   LAPAROSCOPIC APPENDECTOMY  12/03/2011   Procedure: APPENDECTOMY LAPAROSCOPIC;  Surgeon: Lodema Pilot, DO;  Location: [facility];  Service: General;  Laterality: N/A;  drainage of intra-abdominal abscess    PARS PLANA VITRECTOMY Left 08/17/2012   Procedure: PARS PLANA VITRECTOMY WITH 25 GAUGE;  Surgeon: Beulah Gandy  Ashley Royalty, MD;  Location: [facility] OR;  Service: Ophthalmology;  Laterality: Left;   POLYPECTOMY  07/29/2022   Procedure: POLYPECTOMY;  Surgeon: Mansouraty, Netty Starring., MD;  Location: [facility];  Service: Gastroenterology;;   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   Procedure: REPAIR OF COMPLEX TRACTION RETINAL DETACHMENT;  Surgeon: Sherrie George, MD;  Location: [facility] OR;  Service: Ophthalmology;  Laterality: Left;   TUBAL LIGATION     Social History   Socioeconomic History   Marital status: Divorced    Spouse name: Not on file   Number of children: 2   Years of education: Not on file   Highest education level: Not on file  Occupational History   Not on file  Tobacco Use   Smoking status: Every Day    Types: Cigars   Smokeless tobacco: Never  Vaping Use   Vaping Use: Never used  Substance and Sexual Activity    Alcohol use: Yes    Alcohol/week: 1.0 standard drink of alcohol    Types: 1 Glasses of wine per week    Comment: occasional   Drug use: No   Sexual activity: Yes    Birth control/protection: Pill  Other Topics Concern   Not on file  Social History Narrative   Lives alone.    Social Determinants of Health   Financial Resource Strain: Not on file  Food Insecurity: Not on file  Transportation Needs: Not on file  Physical Activity: Not on file  Stress: Not on file  Social Connections: Not on file  Intimate Partner Violence: Not on file   Family History  Problem Relation Age of Onset   Breast cancer Mother 43   Diabetes Mother    Hypertension Mother    Kidney failure Father    Heart disease Father        No details   Diabetes Sister    Hypertension Sister    Hyperlipidemia Sister    Diabetes Maternal Uncle    Cancer Maternal Uncle        type unknown   Diabetes Paternal Aunt    Hypertension Paternal Aunt    Breast cancer Paternal Aunt    Fibromyalgia Paternal Aunt    Breast cancer Maternal Grandmother    Hypertension Maternal Grandmother    Kidney disease Maternal Grandfather    Cancer Maternal Grandfather        type unknown   Diabetes Paternal Grandmother    Diabetes Paternal Grandfather    Diabetes Daughter    Colon cancer Neg Hx    Esophageal cancer Neg Hx    Inflammatory bowel disease Neg Hx    Liver disease Neg Hx    Pancreatic cancer Neg Hx    Rectal cancer Neg Hx    Stomach cancer Neg Hx    I have reviewed her medical, social, and family history in detail and updated the electronic medical record as necessary.    PHYSICAL EXAMINATION  BP 138/86   Pulse 75   Ht 5\' 10"  (1.778 m)   Wt (!) 312 lb (141.5 kg)   LMP 08/09/2012   BMI 44.77 kg/m  Wt Readings from Last 3 Encounters:  10/12/22 (!) 312 lb (141.5 kg)  07/22/22 (!) 306 lb (138.8 kg)  06/02/22 (!) 306 lb 6.4 oz (139 kg)  GEN: NAD, appears stated age, doesn't appear chronically ill PSYCH:  Cooperative, without pressured speech EYE: Conjunctivae pink, sclerae anicteric ENT: MMM CV: Nontachycardic RESP: No audible wheezing GI: NABS, soft, rounded,  protuberant, obese, NT, no rebound MSK/EXT: Bilateral pedal edema present SKIN: No jaundice NEURO:  Alert & Oriented x 3, no focal deficits   REVIEW OF DATA  I reviewed the following data at the time of this encounter:  GI Procedures and Studies  January 2024 colonoscopy - Perianal skin tags found on perianal exam. - Hemorrhoids found on digital rectal exam. - There was significant looping of the colon. - The examined portion of the ileum was normal. - One 14 mm polyp in the proximal rectum, removed with a cold snare. Resected and retrieved. - Normal mucosa in the entire examined colon otherwise. - Non-bleeding non-thrombosed external and internal hemorrhoids.  January 2024 EUS EGD impression: - No gross lesions in the proximal esophagus and in the mid esophagus. Subepithelial nodule found in the distal esophagus. Z-line regular, 45 cm from the incisors. - 2 cm hiatal hernia. - Erythematous mucosa in the stomach. Biopsied. - No gross lesions in the duodenal bulb, in the first portion of the duodenum and in the second portion of the duodenum. Biopsied. EUS impression: - An intramural (subepithelial) lesion was found in the lower third of the esophagus. It appeared to originate from within the deep mucosa (Layer 2). Cytology results are pending. 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 Fine needle biopsy performed. Compared to prior EUS from 2016 at [facility], looks like the size has not drastically changed in years. - No malignant-appearing lymph nodes were visualized in the middle paraesophageal mediastinum (level 50M), lower paraesophageal mediastinum (level 8L) and paracardial region (level 16).  Pathology FINAL MICROSCOPIC DIAGNOSIS:  A. DUODENUM, BIOPSY:   - Duodenal mucosa without diagnostic abnormality  - Negative for celiac change  B. STOMACH, BIOPSY:  - Antral and oxyntic mucosae with chronic, focally active Helicobacter  associated gastritis with erosion/ulceration  - Oxyntic mucosa with early proton pump inhibitor type effect  - Immunohistochemical stain for Helicobacter organisms is positive  C. RECTUM, POLYPECTOMY:  - Tubular adenoma (multiple fragments)  - Negative for high-grade dysplasia or malignancy   Specimen Submitted:  A. ESOPHAGUS, SUB EPITHELIAL LESION, FINE NEEDLE  ASPIRATION:  FINAL MICROSCOPIC DIAGNOSIS:  - Atypical cells present  - See comment  SPECIMEN ADEQUACY:  Satisfactory for evaluation  DIAGNOSTIC COMMENTS:  Focal atypical spindle cells suspicious for spindle cell neoplasm are  present.  Immunohistochemical stains are performed on the cellblock.  A  fragment of focal smooth muscle is positive for SMA.  The remainder of  the immunohistochemical stains (CD117, Ki-67, CD34 and desmin) are  inconclusive due to scant to no remaining tissue present.  The  differential diagnosis includes a GIST versus a leiomyoma.  Clinical and  endoscopic correlation recommended.   Laboratory Studies  Reviewed those in epic  Imaging Studies  May 2023 CT abdomen pelvis IMPRESSION: 1. No acute abdominopelvic findings or explanation for the patient's symptoms. 2. Incompletely visualized large mass in the anterior mediastinum has enlarged from remote CTs of the neck and chest with more inferior extension. This was previously felt to reflect substernal extension of a thyroid goiter. Because of the incomplete visualization of this process, consider further evaluation with dedicated chest CT. 3. Incidental findings including cholelithiasis, small renal angiomyolipomas, an enlarging umbilical hernia containing only fat, uterine fibroids and Aortic Atherosclerosis (ICD10-I70.0).  August 2023 CT chest IMPRESSION: 1. Very  bulky, heterogeneous thyroid goiter, which grossly deflects and narrows the upper trachea. There appears to be an unusually large, bulky  substernal component which is inseparable from the thyroid, and markedly enlarged in comparison to remote prior examination dated 06/14/2008. Substernal component measures approximately 13.1 x 8.7 x 5.9 cm, previously 8.9 x 6.0 x 5.9 cm. The thyroid has been evaluated by multiple prior thyroid ultrasound studies and biopsied on multiple occasions. (Ref: J Am Coll Radiol. 2015 Feb;12(2): 143-50). 2. Coronary artery disease. Aortic Atherosclerosis (ICD10-I70.0).   ASSESSMENT  Ms. Girdler is a 57 y.o. 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 pylori (pending eradication), colon polyps (TA's), chronic constipation.  The patient is seen today for evaluation and management of:  1. History of Helicobacter infection   2. Constipation, unspecified constipation type   3. Gastroesophageal reflux disease, unspecified whether esophagitis present   4. Nodule of esophagus   5. Dysuria    The patient is hemodynamically and clinically stable.  We are going to make sure that the patient no longer has H. pylori.  She will undergo Diatherix H. pylori testing, since she has difficulty coming off PPI for too long of a period of time.  The esophageal subepithelial lesion looks to be a GIST versus leiomyoma but otherwise is stable over many years when it was initially evaluated at [facility] and decided upon previously to just monitor.  She still has a large goiter that could require other management which is deferred to her primary care doctor.  Will asked the patient to initiate higher doses of fiber and start using MiraLAX more regularly.  Will give patient samples of Linzess to  see if that may be helpful for her.  I have gone over toileting instructions as well.  We will make sure she does not have a UTI since she is having dysuria symptoms.  All patient questions were answered to the best of my ability, and the patient agrees to the aforementioned plan of action with follow-up as indicated.   PLAN  H. 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    Take 1 tablet (40 mg total) by mouth 2 (two) times daily.    Planned Follow Up Return in about 3 months (around 01/11/2023).   Total Time in Face-to-Face and in Coordination of Care for patient including independent/personal interpretation/review of prior testing, medical history, examination, medication adjustment, communicating results with the patient directly, and documentation within the EHR is 25 minutes.   Corliss Parish, MD [facility] Office # [phone]

## 2022-10-14 ENCOUNTER — Encounter: Payer: Self-pay | Admitting: Gastroenterology

## 2022-10-14 DIAGNOSIS — Z8619 Personal history of other infectious and parasitic diseases: Secondary | ICD-10-CM | POA: Insufficient documentation

## 2022-10-14 DIAGNOSIS — K229 Disease of esophagus, unspecified: Secondary | ICD-10-CM | POA: Insufficient documentation

## 2022-10-14 DIAGNOSIS — K5900 Constipation, unspecified: Secondary | ICD-10-CM | POA: Insufficient documentation

## 2022-10-14 DIAGNOSIS — R300 Dysuria: Secondary | ICD-10-CM | POA: Insufficient documentation

## 2022-10-15 ENCOUNTER — Encounter: Payer: Self-pay | Admitting: Hematology

## 2022-10-17 ENCOUNTER — Encounter: Payer: Self-pay | Admitting: Gastroenterology

## 2022-10-18 ENCOUNTER — Other Ambulatory Visit: Payer: Self-pay

## 2022-10-19 ENCOUNTER — Encounter: Payer: Self-pay | Admitting: Hematology

## 2022-10-19 ENCOUNTER — Ambulatory Visit (INDEPENDENT_AMBULATORY_CARE_PROVIDER_SITE_OTHER): Payer: Medicaid Other

## 2022-10-19 ENCOUNTER — Ambulatory Visit (INDEPENDENT_AMBULATORY_CARE_PROVIDER_SITE_OTHER): Payer: Medicaid Other | Admitting: Podiatry

## 2022-10-19 ENCOUNTER — Other Ambulatory Visit: Payer: Self-pay

## 2022-10-19 DIAGNOSIS — L97529 Non-pressure chronic ulcer of other part of left foot with unspecified severity: Secondary | ICD-10-CM

## 2022-10-19 DIAGNOSIS — B351 Tinea unguium: Secondary | ICD-10-CM

## 2022-10-19 DIAGNOSIS — E11621 Type 2 diabetes mellitus with foot ulcer: Secondary | ICD-10-CM

## 2022-10-19 DIAGNOSIS — M79674 Pain in right toe(s): Secondary | ICD-10-CM | POA: Diagnosis not present

## 2022-10-19 DIAGNOSIS — L03032 Cellulitis of left toe: Secondary | ICD-10-CM

## 2022-10-19 DIAGNOSIS — M79675 Pain in left toe(s): Secondary | ICD-10-CM

## 2022-10-19 MED ORDER — MUPIROCIN 2 % EX OINT
1.0000 | TOPICAL_OINTMENT | Freq: Every day | CUTANEOUS | 2 refills | Status: DC
  Filled 2022-10-19: qty 22, 11d supply, fill #0
  Filled 2022-11-11: qty 22, 11d supply, fill #1
  Filled 2023-01-18 – 2023-01-19 (×2): qty 22, 11d supply, fill #2

## 2022-10-19 MED ORDER — AMOXICILLIN-POT CLAVULANATE 875-125 MG PO TABS
1.0000 | ORAL_TABLET | Freq: Two times a day (BID) | ORAL | 0 refills | Status: DC
  Filled 2022-10-19: qty 28, 14d supply, fill #0

## 2022-10-20 ENCOUNTER — Encounter: Payer: Self-pay | Admitting: Nurse Practitioner

## 2022-10-20 ENCOUNTER — Other Ambulatory Visit: Payer: Self-pay

## 2022-10-20 ENCOUNTER — Other Ambulatory Visit: Payer: Self-pay | Admitting: Pharmacist

## 2022-10-20 ENCOUNTER — Encounter: Payer: Self-pay | Admitting: Hematology

## 2022-10-20 ENCOUNTER — Ambulatory Visit: Payer: Medicaid Other | Attending: Nurse Practitioner | Admitting: Nurse Practitioner

## 2022-10-20 VITALS — BP 143/77 | HR 68 | Ht 70.0 in | Wt 306.0 lb

## 2022-10-20 DIAGNOSIS — D649 Anemia, unspecified: Secondary | ICD-10-CM

## 2022-10-20 DIAGNOSIS — I10 Essential (primary) hypertension: Secondary | ICD-10-CM | POA: Diagnosis not present

## 2022-10-20 DIAGNOSIS — E1165 Type 2 diabetes mellitus with hyperglycemia: Secondary | ICD-10-CM

## 2022-10-20 DIAGNOSIS — E162 Hypoglycemia, unspecified: Secondary | ICD-10-CM

## 2022-10-20 DIAGNOSIS — G8929 Other chronic pain: Secondary | ICD-10-CM

## 2022-10-20 DIAGNOSIS — E785 Hyperlipidemia, unspecified: Secondary | ICD-10-CM

## 2022-10-20 DIAGNOSIS — M545 Low back pain, unspecified: Secondary | ICD-10-CM

## 2022-10-20 DIAGNOSIS — E119 Type 2 diabetes mellitus without complications: Secondary | ICD-10-CM

## 2022-10-20 DIAGNOSIS — Z794 Long term (current) use of insulin: Secondary | ICD-10-CM | POA: Diagnosis not present

## 2022-10-20 DIAGNOSIS — E1142 Type 2 diabetes mellitus with diabetic polyneuropathy: Secondary | ICD-10-CM | POA: Diagnosis not present

## 2022-10-20 LAB — POCT GLYCOSYLATED HEMOGLOBIN (HGB A1C): Hemoglobin A1C: 10.1 % — AB (ref 4.0–5.6)

## 2022-10-20 MED ORDER — BASAGLAR KWIKPEN 100 UNIT/ML ~~LOC~~ SOPN
PEN_INJECTOR | SUBCUTANEOUS | 3 refills | Status: DC
Start: 2022-10-20 — End: 2022-10-21
  Filled 2022-10-20: qty 75, 88d supply, fill #0

## 2022-10-20 MED ORDER — BLOOD PRESSURE MONITOR DEVI
0 refills | Status: AC
Start: 2022-10-20 — End: ?

## 2022-10-20 MED ORDER — LISINOPRIL 40 MG PO TABS
40.0000 mg | ORAL_TABLET | Freq: Every day | ORAL | 1 refills | Status: DC
Start: 2022-10-20 — End: 2023-03-01
  Filled 2022-10-20: qty 90, 90d supply, fill #0
  Filled 2023-01-10 – 2023-01-19 (×3): qty 90, 90d supply, fill #1

## 2022-10-20 MED ORDER — CYCLOBENZAPRINE HCL 10 MG PO TABS
10.0000 mg | ORAL_TABLET | Freq: Three times a day (TID) | ORAL | 2 refills | Status: DC | PRN
  Filled 2022-10-20: qty 90, 30d supply, fill #0
  Filled 2023-01-10 – 2023-01-19 (×3): qty 90, 30d supply, fill #1
  Filled 2023-07-28: qty 90, 30d supply, fill #2

## 2022-10-20 MED ORDER — GABAPENTIN 300 MG PO CAPS
300.0000 mg | ORAL_CAPSULE | Freq: Every day | ORAL | 2 refills | Status: DC
Start: 2022-10-20 — End: 2023-11-06
  Filled 2022-10-20: qty 90, 90d supply, fill #0
  Filled 2023-01-18 – 2023-01-20 (×3): qty 90, 90d supply, fill #1
  Filled 2023-04-21: qty 90, 90d supply, fill #2
  Filled 2023-07-28: qty 90, 90d supply, fill #3

## 2022-10-20 MED ORDER — DEXCOM G7 RECEIVER DEVI
0 refills | Status: DC
Start: 2022-10-20 — End: 2023-04-18
  Filled 2022-10-20: qty 1, 30d supply, fill #0

## 2022-10-20 MED ORDER — FREESTYLE LIBRE 2 SENSOR MISC
6 refills | Status: DC
Start: 2022-10-20 — End: 2022-10-20
  Filled 2022-10-20: qty 2, 28d supply, fill #0

## 2022-10-20 MED ORDER — ROSUVASTATIN CALCIUM 5 MG PO TABS
5.0000 mg | ORAL_TABLET | Freq: Every day | ORAL | 3 refills | Status: DC
Start: 2022-10-20 — End: 2023-11-03
  Filled 2022-10-20: qty 90, 90d supply, fill #0
  Filled 2023-01-18 – 2023-01-19 (×2): qty 90, 90d supply, fill #1
  Filled 2023-04-21 – 2023-04-22 (×2): qty 90, 90d supply, fill #2
  Filled 2023-07-28: qty 90, 90d supply, fill #3

## 2022-10-20 MED ORDER — FREESTYLE LIBRE 2 READER DEVI
1.0000 | Freq: Once | 0 refills | Status: DC
Start: 2022-10-20 — End: 2022-10-20
  Filled 2022-10-20: qty 1, 1d supply, fill #0

## 2022-10-20 MED ORDER — HYDROCHLOROTHIAZIDE 25 MG PO TABS
25.0000 mg | ORAL_TABLET | Freq: Every day | ORAL | 1 refills | Status: DC
Start: 2022-10-20 — End: 2023-03-01
  Filled 2022-10-20: qty 90, 90d supply, fill #0
  Filled 2023-01-18 – 2023-01-20 (×3): qty 90, 90d supply, fill #1

## 2022-10-20 MED ORDER — GLIPIZIDE 10 MG PO TABS
10.0000 mg | ORAL_TABLET | Freq: Two times a day (BID) | ORAL | 1 refills | Status: DC
Start: 2022-10-20 — End: 2023-03-01
  Filled 2022-10-20 – 2022-11-11 (×2): qty 180, 90d supply, fill #0
  Filled 2023-02-06: qty 180, 90d supply, fill #1

## 2022-10-20 MED ORDER — FENOFIBRATE 160 MG PO TABS
ORAL_TABLET | Freq: Every day | ORAL | 3 refills | Status: DC
Start: 2022-10-20 — End: 2023-03-01
  Filled 2022-10-20: qty 90, 90d supply, fill #0
  Filled 2023-01-18 – 2023-01-26 (×3): qty 30, 30d supply, fill #1

## 2022-10-20 MED ORDER — OZEMPIC (0.25 OR 0.5 MG/DOSE) 2 MG/3ML ~~LOC~~ SOPN
0.2500 mg | PEN_INJECTOR | SUBCUTANEOUS | 1 refills | Status: DC
Start: 2022-10-20 — End: 2022-12-07
  Filled 2022-10-20: qty 3, 56d supply, fill #0

## 2022-10-20 MED ORDER — DEXCOM G7 SENSOR MISC
6 refills | Status: DC
Start: 2022-10-20 — End: 2023-04-18
  Filled 2022-10-20: qty 3, 30d supply, fill #0
  Filled 2022-11-30 – 2023-02-09 (×5): qty 3, 30d supply, fill #1
  Filled 2023-03-07: qty 3, 30d supply, fill #2
  Filled 2023-04-05: qty 3, 30d supply, fill #3

## 2022-10-20 NOTE — Progress Notes (Addendum)
Assessment & Plan:  Kristina Adams was seen today for diabetes and hypertension.  Diagnoses and all orders for this visit:  Type 2 diabetes mellitus with hyperglycemia, with long-term current use of insulin Start ozempic -     POCT glycosylated hemoglobin (Hb A1C) -     Ambulatory referral to Ophthalmology -     Continuous Blood Gluc Receiver (FREESTYLE LIBRE 2 READER) DEVI; use as directed -     Continuous Blood Gluc Sensor (FREESTYLE LIBRE 2 SENSOR) MISC; Monitor blood glucose levels continously -     Amb Referral to DSME/T -     glipiZIDE (GLUCOTROL) 10 MG tablet; Take 1 tablet (10 mg total) by mouth 2 (two) times daily before a meal. -     Insulin Glargine (BASAGLAR KWIKPEN) 100 UNIT/ML; Inject into the skin 45 units in the am and 40 units in the pm -     Semaglutide,0.25 or 0.5MG /DOS, (OZEMPIC, 0.25 OR 0.5 MG/DOSE,) 2 MG/3ML SOPN; Inject 0.25 mg into the skin once a week. -     CMP14+EGFR  Essential hypertension BP monitor [facility] -     hydrochlorothiazide (HYDRODIURIL) 25 MG tablet; TAKE 1 TABLET (25 MG TOTAL) BY MOUTH DAILY. -     lisinopril (ZESTRIL) 40 MG tablet; Take 1 tablet (40 mg total) by mouth daily. -     CMP14+EGFR Continue all antihypertensives as prescribed.  Reminded to bring in blood pressure log for follow  up appointment.  RECOMMENDATIONS: DASH/Mediterranean Diets are healthier choices for HTN.    Diabetic polyneuropathy associated with type 2 diabetes mellitus -     gabapentin (NEURONTIN) 300 MG capsule; Take 1 capsule (300 mg total) by mouth at bedtime.  Dyslipidemia, goal LDL below 70 -     rosuvastatin (CRESTOR) 5 MG tablet; Take 1 tablet (5 mg total) by mouth daily. -     fenofibrate 160 MG tablet; TAKE 1 TABLET (160 MG TOTAL) BY MOUTH DAILY. INSTRUCTIONS: Work on a low fat, heart healthy diet and participate in regular aerobic exercise program by working out at least 150 minutes per week; 5 days a week-30 minutes per day. Avoid red  meat/beef/steak,  fried foods. junk foods, sodas, sugary drinks, unhealthy snacking, alcohol and smoking.  Drink at least 80 oz of water per day and monitor your carbohydrate intake daily.    Chronic bilateral low back pain without sciatica -     cyclobenzaprine (FLEXERIL) 10 MG tablet; TAKE 1 TABLET (10 MG TOTAL) BY MOUTH 3 (THREE) TIMES DAILY AS NEEDED FOR MUSCLE SPASMS. Work on losing weight to help reduce back pain. May alternate with heat and ice application for pain relief. May also alternate with acetaminophen and Ibuprofen as prescribed for back pain. Other alternatives include massage, acupuncture and water aerobics.  You must stay active and avoid a sedentary lifestyle.    Anemia, unspecified type -     CBC with Differential    Patient has been counseled on age-appropriate routine health concerns for screening and prevention. These are reviewed and up-to-date. Referrals have been placed accordingly. Immunizations are up-to-date or declined.    Subjective:   Chief Complaint  Patient presents with   Diabetes   Hypertension    Kristina Adams 57 y.o. female presents to office today for follow up to HTN and DM  States she is feeling really good today. Had recent colonoscopy. Follow up screening 10 years. Still with some digestive issues and concerned about stools and constipation. Taking linzess.  She has a past medical history of Anemia (12/09/2011), Anxiety, Appendicitis, acute (12/03/2011), Arthritis, Chronic back pain (12/03/2011), Degenerative disc disease, cervical, Diabetes mellitus type 2, insulin dependent (12/03/2011), GERD, H/O hiatal hernia, HLD, Hypertension (12/03/2011), Hyperthyroidism, IBS,  Morbid obesity with BMI of 45.0-49.9, adult (HCC) (12/03/2011), and Sleep apnea (12/03/2011).   HTN Blood pressure is not at goal today. She does not have a blood pressure monitor at home.  I have ordered one for her today. Endorses adherence taking hydrochlorothiazide 25 mg daily  and lisinopril 40 mg daily. BP Readings from Last 3 Encounters:  10/20/22 (!) 143/77  10/12/22 138/86  07/29/22 (!) 155/64      DM 2 She is not taking Trulicity as she felt it was not effective.  Will try ozempic at this time. Her diabetes is poorly controlled. Will refer to a dietician as well. She is currently taking glipizide 10 mg twice daily and she will continue on basaglar 45 units in the am and 40 units in the pm. Hyperglycemic symptoms include peripheral neuropathy for which she takes gabapentin. Currently being followed by podiatry for diabetic foot ulcer. Lab Results  Component Value Date   HGBA1C 10.1 (A) 10/20/2022   Lab Results  Component Value Date   HGBA1C 8.8 (A) 06/02/2022       Review of Systems  Constitutional:  Negative for fever, malaise/fatigue and weight loss.  HENT: Negative.  Negative for nosebleeds.   Eyes: Negative.  Negative for blurred vision, double vision and photophobia.  Respiratory: Negative.  Negative for cough and shortness of breath.   Cardiovascular: Negative.  Negative for chest pain, palpitations and leg swelling.  Gastrointestinal:  Positive for constipation (recently started linzess). Negative for heartburn, nausea and vomiting.  Genitourinary:  Positive for frequency. Negative for dysuria, flank pain, hematuria and urgency.  Musculoskeletal:  Positive for back pain. Negative for myalgias.  Neurological: Negative.  Negative for dizziness, focal weakness, seizures and headaches.  Psychiatric/Behavioral: Negative.  Negative for suicidal ideas.     Past Medical History:  Diagnosis Date   Anemia 12/09/2011   child birth   Anxiety    Appendicitis, acute 12/03/2011   Arthritis    Chronic back pain 12/03/2011   Degenerative disc disease, cervical    low back area   Diabetes mellitus type 2, insulin dependent 12/03/2011   GERD (gastroesophageal reflux disease)    H/O hiatal hernia    HLD (hyperlipidemia)    Hypertension 12/03/2011    Hyperthyroidism    IBS (irritable bowel syndrome)    Morbid obesity with BMI of 45.0-49.9, adult 12/03/2011   Sleep apnea 12/03/2011   last sleep study May 2013    Past Surgical History:  Procedure Laterality Date   BIOPSY  07/29/2022   Procedure: BIOPSY;  Surgeon: Lemar Lofty., MD;  Location: [facility];  Service: Gastroenterology;;   CESAREAN SECTION     x 2   COLONOSCOPY WITH PROPOFOL N/A 07/29/2022   Procedure: COLONOSCOPY WITH PROPOFOL;  Surgeon: Lemar Lofty., MD;  Location: [facility];  Service: Gastroenterology;  Laterality: N/A;   ESOPHAGOGASTRODUODENOSCOPY (EGD) WITH PROPOFOL N/A 07/29/2022   Procedure: ESOPHAGOGASTRODUODENOSCOPY (EGD) WITH PROPOFOL;  Surgeon: Meridee Score Netty Starring., MD;  Location: [facility];  Service: Gastroenterology;  Laterality: N/A;   EUS N/A 07/29/2022   Procedure: UPPER ENDOSCOPIC ULTRASOUND (EUS) LINEAR;  Surgeon: Lemar Lofty., MD;  Location: [facility];  Service: Gastroenterology;  Laterality: N/A;   FINE NEEDLE ASPIRATION N/A 07/29/2022   Procedure: FINE NEEDLE ASPIRATION (FNA)  LINEAR;  Surgeon: Meridee ScoreMansouraty, Netty StarringGabriel Jr., MD;  [facility];  Service: Gastroenterology;  Laterality: N/A;   LAPAROSCOPIC APPENDECTOMY  12/03/2011   Procedure: APPENDECTOMY LAPAROSCOPIC;  Surgeon: Lodema PilotBrian Layton, DO;  Location: [facility];  Service: General;  Laterality: N/A;  drainage of intra-abdominal abscess    PARS PLANA VITRECTOMY Left 08/17/2012   Procedure: PARS PLANA VITRECTOMY WITH 25 GAUGE;  Surgeon: Sherrie GeorgeJohn D Matthews, MD;  [facility];  Service: Ophthalmology;  Laterality: Left;   POLYPECTOMY  07/29/2022   Procedure: POLYPECTOMY;  Surgeon: Mansouraty, Netty StarringGabriel Jr., MD;  [facility];  Service: Gastroenterology;;   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   Procedure: REPAIR OF COMPLEX TRACTION RETINAL DETACHMENT;  Surgeon: Sherrie GeorgeJohn D Matthews, MD;  [facility];  Service: Ophthalmology;   Laterality: Left;   TUBAL LIGATION      Family History  Problem Relation Age of Onset   Breast cancer Mother [id number]   Diabetes Mother    Hypertension Mother    Kidney failure Father    Heart disease Father        No details   Diabetes Sister    Hypertension Sister    Hyperlipidemia Sister    Diabetes Maternal Uncle    Cancer Maternal Uncle        type unknown   Diabetes Paternal Aunt    Hypertension Paternal Aunt    Breast cancer Paternal Aunt    Fibromyalgia Paternal Aunt    Breast cancer Maternal Grandmother    Hypertension Maternal Grandmother    Kidney disease Maternal Grandfather    Cancer Maternal Grandfather        type unknown   Diabetes Paternal Grandmother    Diabetes Paternal Grandfather    Diabetes Daughter    Colon cancer Neg Hx    Esophageal cancer Neg Hx    Inflammatory bowel disease Neg Hx    Liver disease Neg Hx    Pancreatic cancer Neg Hx    Rectal cancer Neg Hx    Stomach cancer Neg Hx     Social History Reviewed with no changes to be made today.   Outpatient Medications Prior to Visit  Medication Sig Dispense Refill   amoxicillin-clavulanate (AUGMENTIN) 875-125 MG tablet Take 1 tablet by mouth 2 (two) times daily. 28 tablet 0   blood glucose meter kit and supplies KIT Use up to 2 times daily as directed. 1 each 0   diclofenac Sodium (VOLTAREN) 1 % GEL APPLY 4 G TOPICALLY 4 (FOUR) TIMES DAILY. (Patient taking differently: Apply 4 g topically daily as needed (Muscle spasm).) 300 g 2   glucose blood (TRUE METRIX BLOOD GLUCOSE TEST) test strip Testing twice daily. 200 each 1   Insulin Pen Needle (B-D UF III MINI PEN NEEDLES) 31G X 5 MM MISC Use as instructed. Inject into the skin twice daily. NEEDS PASS 100 each 6   Lancets (ONETOUCH DELICA PLUS LANCET33G) MISC Testing twice daily 200 each 1   methimazole (TAPAZOLE) 5 MG tablet Take 1 tablet (5 mg total) by mouth 2 (two) times daily. 60 tablet 0   Multiple Vitamin (MULTI-VITAMIN) tablet Take 1 tablet by  mouth daily.     mupirocin ointment (BACTROBAN) 2 % Apply 1 Application topically daily. 22 g 2   pantoprazole (PROTONIX) 40 MG tablet Take 1 tablet (40 mg total) by mouth 2 (two) times daily. 60 tablet 12   traZODone (DESYREL) 100 MG tablet Take by mouth.     vitamin C (ASCORBIC  ACID) 500 MG tablet Take 1,000 mg by mouth daily.     cyclobenzaprine (FLEXERIL) 10 MG tablet TAKE 1 TABLET (10 MG TOTAL) BY MOUTH 3 (THREE) TIMES DAILY AS NEEDED FOR MUSCLE SPASMS. (Patient taking differently: Take 10 mg by mouth every other day. At bedtime) 90 tablet 2   fenofibrate 160 MG tablet TAKE 1 TABLET (160 MG TOTAL) BY MOUTH DAILY. 90 tablet 3   gabapentin (NEURONTIN) 300 MG capsule Take 1 capsule (300 mg total) by mouth at bedtime. 90 capsule 1   glipiZIDE (GLUCOTROL) 10 MG tablet Take 1 tablet (10 mg total) by mouth 2 (two) times daily before a meal. 180 tablet 1   hydrochlorothiazide (HYDRODIURIL) 25 MG tablet TAKE 1 TABLET (25 MG TOTAL) BY MOUTH DAILY. 90 tablet 1   Insulin Glargine (BASAGLAR KWIKPEN) 100 UNIT/ML Inject into the skin 45 units in the am and 40 units in the pm (Patient taking differently: Inject 40-45 Units into the skin See admin instructions. Inject into the skin 45 units in the am and 40 units in the pm) 78 mL 3   lisinopril (ZESTRIL) 40 MG tablet Take 1 tablet (40 mg total) by mouth daily. 90 tablet 1   rosuvastatin (CRESTOR) 5 MG tablet Take 1 tablet (5 mg total) by mouth daily. 90 tablet 3   aspirin EC 81 MG tablet Take by mouth. (Patient not taking: Reported on 10/20/2022)     Cholecalciferol (VITAMIN D-3) 125 MCG (5000 UT) TABS Take 1 tablet by mouth daily. (Patient not taking: Reported on 10/12/2022) 90 tablet 3   No facility-administered medications prior to visit.    Allergies  Allergen Reactions   Latex Other (See Comments)    Infection in left middle finger from latex gloves due to moisture.   Iohexol      Code: RASH, Desc: PATIENT CALLED 06/17/08-STATED AFTER 6 HOURS POST IV  CONTRAST, HANDS,SCALP AND GROIN AREA DEVELOPED ITCHING,BURNING SENSATION, Onset Date: [id number]        Objective:    BP (!) 143/77   Pulse 68   Ht 5\' 10"  (1.778 m)   Wt (!) 306 lb (138.8 kg)   LMP 08/09/2012   SpO2 99%   BMI 43.91 kg/m  Wt Readings from Last 3 Encounters:  10/20/22 (!) 306 lb (138.8 kg)  10/12/22 (!) 312 lb (141.5 kg)  07/22/22 (!) 306 lb (138.8 kg)    Physical Exam Vitals and nursing note reviewed.  Constitutional:      Appearance: She is well-developed.  HENT:     Head: Normocephalic and atraumatic.  Cardiovascular:     Rate and Rhythm: Normal rate and regular rhythm.     Heart sounds: Normal heart sounds. No murmur heard.    No friction rub. No gallop.  Pulmonary:     Effort: Pulmonary effort is normal. No tachypnea or respiratory distress.     Breath sounds: Normal breath sounds. No decreased breath sounds, wheezing, rhonchi or rales.  Chest:     Chest wall: No tenderness.  Abdominal:     General: Bowel sounds are normal.     Palpations: Abdomen is soft.  Musculoskeletal:        General: Normal range of motion.     Cervical back: Normal range of motion.  Skin:    General: Skin is warm and dry.  Neurological:     Mental Status: She is alert and oriented to person, place, and time.     Coordination: Coordination normal.  Psychiatric:  Behavior: Behavior normal. Behavior is cooperative.        Thought Content: Thought content normal.        Judgment: Judgment normal.          Patient has been counseled extensively about nutrition and exercise as well as the importance of adherence with medications and regular follow-up. The patient was given clear instructions to go to ER or return to medical center if symptoms don't improve, worsen or new problems develop. The patient verbalized understanding.   Follow-up: Return for 4-5 weeks with [facility] for meter check. Claiborne Rigg, FNP-BC [facility] [place], [place] [phone]   10/20/2022, 5:49 PM

## 2022-10-21 ENCOUNTER — Other Ambulatory Visit: Payer: Self-pay

## 2022-10-21 ENCOUNTER — Other Ambulatory Visit: Payer: Self-pay | Admitting: Pharmacist

## 2022-10-21 ENCOUNTER — Encounter: Payer: Self-pay | Admitting: Hematology

## 2022-10-21 LAB — CMP14+EGFR
ALT: 9 IU/L (ref 0–32)
AST: 10 IU/L (ref 0–40)
Albumin/Globulin Ratio: 1.9 (ref 1.2–2.2)
Albumin: 4.5 g/dL (ref 3.8–4.9)
Alkaline Phosphatase: 77 IU/L (ref 44–121)
BUN/Creatinine Ratio: 22 (ref 9–23)
BUN: 21 mg/dL (ref 6–24)
Bilirubin Total: 0.2 mg/dL (ref 0.0–1.2)
CO2: 25 mmol/L (ref 20–29)
Calcium: 9.5 mg/dL (ref 8.7–10.2)
Chloride: 100 mmol/L (ref 96–106)
Creatinine, Ser: 0.96 mg/dL (ref 0.57–1.00)
Globulin, Total: 2.4 g/dL (ref 1.5–4.5)
Glucose: 255 mg/dL — ABNORMAL HIGH (ref 70–99)
Potassium: 4.6 mmol/L (ref 3.5–5.2)
Sodium: 139 mmol/L (ref 134–144)
Total Protein: 6.9 g/dL (ref 6.0–8.5)
eGFR: 69 mL/min/{1.73_m2} (ref >59)

## 2022-10-21 LAB — CBC WITH DIFFERENTIAL/PLATELET
Basophils Absolute: 0.1 10*3/uL (ref 0.0–0.2)
Basos: 1 %
EOS (ABSOLUTE): 0.1 10*3/uL (ref 0.0–0.4)
Eos: 2 %
Hematocrit: 34.3 % (ref 34.0–46.6)
Hemoglobin: 10.9 g/dL — ABNORMAL LOW (ref 11.1–15.9)
Immature Grans (Abs): 0 10*3/uL (ref 0.0–0.1)
Immature Granulocytes: 0 %
Lymphocytes Absolute: 1.6 10*3/uL (ref 0.7–3.1)
Lymphs: 29 %
MCH: 27.4 pg (ref 26.6–33.0)
MCHC: 31.8 g/dL (ref 31.5–35.7)
MCV: 86 fL (ref 79–97)
Monocytes Absolute: 0.3 10*3/uL (ref 0.1–0.9)
Monocytes: 5 %
Neutrophils Absolute: 3.5 10*3/uL (ref 1.4–7.0)
Neutrophils: 63 %
Platelets: 357 10*3/uL (ref 150–450)
RBC: 3.98 x10E6/uL (ref 3.77–5.28)
RDW: 12.8 % (ref 11.7–15.4)
WBC: 5.6 10*3/uL (ref 3.4–10.8)

## 2022-10-21 MED ORDER — INSULIN GLARGINE-YFGN 100 UNIT/ML ~~LOC~~ SOPN
PEN_INJECTOR | SUBCUTANEOUS | 3 refills | Status: DC
  Filled 2022-10-21: qty 60, 71d supply, fill #0
  Filled 2023-01-09: qty 60, 71d supply, fill #1

## 2022-10-22 ENCOUNTER — Other Ambulatory Visit: Payer: Self-pay

## 2022-10-24 NOTE — Progress Notes (Signed)
  Subjective:  Patient ID: Kristina Adams, female    DOB: 1966/02/27,  MRN: [id number]  Chief Complaint  Patient presents with   Foot Ulcer    est - left great toe - skin broke on toe - now foot is swollen, sore, slight odor - Type 2 Diabetic - Urgent Work-in - ok per Marchelle Folks    57 y.o. female presents with the above complaint. History confirmed with patient.  This developed over the last couple of days.  She had a loose piece of skin that broke open and spread.  There has been drainage.  Nails are thickened elongated causing pain and discomfort again as well.  Objective:  Physical Exam: warm, good capillary refill, normal DP and PT pulses, reduced sensation at distal tip of toes, and ulceration at left hallux measures 3.0 x 1.5 cm, exposed subcutaneous tissue with a granular wound bed, surrounding hyperkeratosis, serous drainage, erythema surrounding the wound, no ascending cellulitis lymphangitis or purulence or exposed bone tendon or joint.  A thickened elongated yellow discolored brown discolored toenails x 10     Radiographs: Multiple views x-ray of the left foot: no soft tissue emphysema, no signs of osteomyelitis Assessment:   1. Diabetic ulcer of left great toe      Plan:  Patient was evaluated and treated and all questions answered.  Patient educated on diabetes. Discussed proper diabetic foot care and discussed risks and complications of disease. Educated patient in depth on reasons to return to the office immediately should he/she discover anything concerning or new on the feet. All questions answered. Discussed proper shoes as well.   Discussed the etiology and treatment options for the condition in detail with the patient. Educated patient on the topical and oral treatment options for mycotic nails. Recommended debridement of the nails today. Sharp and mechanical debridement performed of all painful and mycotic nails today. Nails debrided in length and thickness using a nail  nipper to level of comfort. Discussed treatment options including appropriate shoe gear. Follow up as needed for painful nails.   Ulcer left hallux -We discussed the etiology and factors that are a part of the wound healing process.  We also discussed the risk of infection both soft tissue and osteomyelitis from open ulceration.  Discussed the risk of limb loss if this happens or worsens. -Debridement as below. -Dressed with Iodosorb, DSD. -Continue home dressing changes daily with 4 x 4 gauze and mupirocin ointment, Rx sent to pharmacy, we reviewed its use -Continue off-loading with surgical shoe and peg assist device. -Vascular testing palpable pulses -HgbA1c: 10.1% -Last antibiotics: Rx for Augmentin sent to pharmacy -Imaging: x-ray reviewed, shows no signs of erosions, osteolysis, osteomyelitis or emphysema.  Procedure: Excisional Debridement of Wound Rationale: Removal of non-viable soft tissue from the wound to promote healing.  Anesthesia: none Post-Debridement Wound Measurements: Noted above Type of Debridement: Sharp Excisional Tissue Removed: Non-viable soft tissue Depth of Debridement: subcutaneous tissue. Technique: Sharp excisional debridement to bleeding, viable wound base.  Dressing: Dry, sterile, compression dressing. Disposition: Patient tolerated procedure well.       Return in about 3 weeks (around 11/09/2022) for wound care.

## 2022-10-25 ENCOUNTER — Other Ambulatory Visit: Payer: Self-pay

## 2022-10-26 ENCOUNTER — Other Ambulatory Visit: Payer: Self-pay

## 2022-10-27 ENCOUNTER — Ambulatory Visit: Payer: Medicaid Other | Admitting: Nurse Practitioner

## 2022-10-31 ENCOUNTER — Encounter: Payer: Self-pay | Admitting: Gastroenterology

## 2022-10-31 NOTE — Progress Notes (Signed)
[facility] stool swab results from 10/23/2022  Negative for H. pylori infection.  Kristina Parish, MD [facility] Office # [phone]

## 2022-11-03 ENCOUNTER — Other Ambulatory Visit: Payer: Self-pay

## 2022-11-03 ENCOUNTER — Other Ambulatory Visit (INDEPENDENT_AMBULATORY_CARE_PROVIDER_SITE_OTHER): Payer: Medicaid Other | Admitting: Podiatry

## 2022-11-03 ENCOUNTER — Telehealth: Payer: Self-pay | Admitting: *Deleted

## 2022-11-03 MED ORDER — DOXYCYCLINE HYCLATE 100 MG PO TABS
100.0000 mg | ORAL_TABLET | Freq: Two times a day (BID) | ORAL | 0 refills | Status: AC
  Filled 2022-11-03: qty 28, 14d supply, fill #0

## 2022-11-03 NOTE — Telephone Encounter (Signed)
Patient updated.

## 2022-11-03 NOTE — Telephone Encounter (Signed)
Patient is calling because the Augmentin prescribed is causing burning and itching going up into  to rectal area, right side of the back pains , sugar levels to run him,has subsided since discontinued taking 17 pills remaining, is there anything else to take? Please advise. She would like for this medication to be added to her allergy list as well.

## 2022-11-03 NOTE — Progress Notes (Signed)
Rx for doxycycline sent to pharmacy 

## 2022-11-04 ENCOUNTER — Other Ambulatory Visit: Payer: Self-pay

## 2022-11-09 ENCOUNTER — Encounter: Payer: Self-pay | Admitting: Gastroenterology

## 2022-11-11 ENCOUNTER — Other Ambulatory Visit: Payer: Self-pay | Admitting: Nurse Practitioner

## 2022-11-11 ENCOUNTER — Other Ambulatory Visit: Payer: Self-pay

## 2022-11-11 ENCOUNTER — Ambulatory Visit (INDEPENDENT_AMBULATORY_CARE_PROVIDER_SITE_OTHER): Payer: BLUE CROSS/BLUE SHIELD | Admitting: Podiatry

## 2022-11-11 DIAGNOSIS — E0590 Thyrotoxicosis, unspecified without thyrotoxic crisis or storm: Secondary | ICD-10-CM

## 2022-11-11 DIAGNOSIS — L84 Corns and callosities: Secondary | ICD-10-CM

## 2022-11-11 DIAGNOSIS — E1142 Type 2 diabetes mellitus with diabetic polyneuropathy: Secondary | ICD-10-CM | POA: Diagnosis not present

## 2022-11-11 DIAGNOSIS — L97529 Non-pressure chronic ulcer of other part of left foot with unspecified severity: Secondary | ICD-10-CM | POA: Diagnosis not present

## 2022-11-11 DIAGNOSIS — E11621 Type 2 diabetes mellitus with foot ulcer: Secondary | ICD-10-CM

## 2022-11-11 MED ORDER — FLUCONAZOLE 150 MG PO TABS
300.0000 mg | ORAL_TABLET | Freq: Once | ORAL | 2 refills | Status: AC
  Filled 2022-11-11: qty 2, 1d supply, fill #0

## 2022-11-11 MED ORDER — METHIMAZOLE 5 MG PO TABS
5.0000 mg | ORAL_TABLET | Freq: Two times a day (BID) | ORAL | 0 refills | Status: DC
Start: 2022-11-11 — End: 2023-02-06
  Filled 2022-11-11: qty 180, 90d supply, fill #0

## 2022-11-11 NOTE — Progress Notes (Signed)
  Subjective:  Patient ID: Kristina Adams, female    DOB: Apr 29, 1966,  MRN: [id number]  Chief Complaint  Patient presents with   Diabetic Ulcer    3 week follow up right great toe    57 y.o. female presents with the above complaint. History confirmed with patient.  She notes is doing better she did develop a yeast infection from the Augmentin.  Noticed some blistering around the toe thinks she may be wrapping it too tight.  Calluses forming on the right foot  Objective:  Physical Exam: warm, good capillary refill, normal DP and PT pulses, reduced sensation at distal tip of toes, and ulceration at left hallux measures 1.5 x 0.8 x 0.3 cm, improved in appearance no active drainage, some blistering around this that is stable, no signs of infection, right foot first MPJ and medial hallux calluses    Radiographs: Multiple views x-ray of the left foot: no soft tissue emphysema, no signs of osteomyelitis Assessment:   1. 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 Callus   3. Type 2 diabetes mellitus with polyneuropathy (HCC)      Plan:  Patient was evaluated and treated and all questions answered.  All symptomatic hyperkeratoses were safely debrided with a sterile #15 blade to patient's level of comfort without incident. We discussed preventative and palliative care of these lesions including supportive and accommodative shoegear, padding, prefabricated and custom molded accommodative orthoses, use of a pumice stone and lotions/creams daily.    Ulcer left hallux -We discussed the etiology and factors that are a part of the wound healing process.  We also discussed the risk of infection both soft tissue and osteomyelitis from open ulceration.  Discussed the risk of limb loss if this happens or worsens. -Debridement as below. -Dressed with Iodosorb, DSD. -Continue home dressing changes daily with 4 x 4 gauze and mupirocin ointment, Rx sent to pharmacy, we reviewed its use -Continue  off-loading with surgical shoe and peg assist device. -Vascular testing palpable pulses -HgbA1c: 10.1% -Last antibiotics: Complete doxycycline, Rx fluconazole sent to pharmacy to combat yeast infection -Imaging: x-ray reviewed, shows no signs of erosions, osteolysis, osteomyelitis or emphysema.  Procedure: Excisional Debridement of Wound Rationale: Removal of non-viable soft tissue from the wound to promote healing.  Anesthesia: none Post-Debridement Wound Measurements: Noted above Type of Debridement: Sharp Excisional Tissue Removed: Non-viable soft tissue Depth of Debridement: subcutaneous tissue. Technique: Sharp excisional debridement to bleeding, viable wound base.  Dressing: Dry, sterile, compression dressing. Disposition: Patient tolerated procedure well.       Return in about 3 weeks (around 12/02/2022) for wound care.

## 2022-11-11 NOTE — Patient Instructions (Signed)
Look for urea 40% cream or ointment and apply to the thickened dry skin / calluses. This can be bought over the counter, at a pharmacy or online such as [employer].  

## 2022-11-12 ENCOUNTER — Other Ambulatory Visit: Payer: Self-pay

## 2022-11-15 ENCOUNTER — Telehealth: Payer: Self-pay | Admitting: *Deleted

## 2022-11-15 NOTE — Telephone Encounter (Signed)
Spoke with patient giving instructions per physician, verbalized understanding and said that she is taking the fluconazole already , has 3 more days of the antibiotic remaining, should she soak the toe as well? Please advise.

## 2022-11-15 NOTE — Telephone Encounter (Signed)
Patient is calling because her blister has not gone away but is still puffy, swollen,tender to touch, is there something she can do at home to help with this or does he need to schedule a sooner appointment? Please advise.

## 2022-11-16 NOTE — Telephone Encounter (Signed)
Patient has been updated per physician's note, verbalized understanding.

## 2022-11-22 ENCOUNTER — Other Ambulatory Visit: Payer: Self-pay

## 2022-11-23 ENCOUNTER — Encounter: Payer: Self-pay | Admitting: Hematology

## 2022-11-29 ENCOUNTER — Encounter: Payer: BLUE CROSS/BLUE SHIELD | Admitting: Nurse Practitioner

## 2022-11-30 ENCOUNTER — Other Ambulatory Visit: Payer: Self-pay

## 2022-12-01 ENCOUNTER — Encounter: Payer: Self-pay | Admitting: Hematology

## 2022-12-02 ENCOUNTER — Other Ambulatory Visit: Payer: Self-pay

## 2022-12-02 ENCOUNTER — Ambulatory Visit (INDEPENDENT_AMBULATORY_CARE_PROVIDER_SITE_OTHER): Payer: BLUE CROSS/BLUE SHIELD | Admitting: Podiatry

## 2022-12-02 DIAGNOSIS — E11621 Type 2 diabetes mellitus with foot ulcer: Secondary | ICD-10-CM | POA: Diagnosis not present

## 2022-12-02 DIAGNOSIS — L97529 Non-pressure chronic ulcer of other part of left foot with unspecified severity: Secondary | ICD-10-CM

## 2022-12-02 DIAGNOSIS — E1142 Type 2 diabetes mellitus with diabetic polyneuropathy: Secondary | ICD-10-CM

## 2022-12-02 MED ORDER — FLUCONAZOLE 150 MG PO TABS
150.0000 mg | ORAL_TABLET | Freq: Once | ORAL | 0 refills | Status: AC
  Filled 2022-12-02: qty 1, 1d supply, fill #0

## 2022-12-02 MED ORDER — DOXYCYCLINE HYCLATE 100 MG PO TABS
100.0000 mg | ORAL_TABLET | Freq: Two times a day (BID) | ORAL | 0 refills | Status: DC
  Filled 2022-12-02: qty 14, 7d supply, fill #0

## 2022-12-02 NOTE — Progress Notes (Signed)
  Subjective:  Patient ID: Kristina Adams, female    DOB: 1966/02/28,  MRN: [id number]  Chief Complaint  Patient presents with   Foot Ulcer    Rm 21 Follow up left great toe ulcer. Pt states her only concern is the darkness of her toe. She feels she is healing and taking care of her wound as instructed.     57 y.o. female presents with the above complaint. History confirmed with patient.  She notes it is doing better but the skin seems to be darkening  Objective:  Physical Exam: warm, good capillary refill, normal DP and PT pulses, reduced sensation at distal tip of toes, and ulceration at left hallux measures 1.0 x 0.3 x 0.3 cm, improved in appearance blistering has resolved there are partial-thickness ulcerations on the medial and lateral portions of the hallux, some hyperpigmentation, no purulence or drainage       Radiographs: Multiple views x-ray of the left foot: no soft tissue emphysema, no signs of osteomyelitis Assessment:   1. 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 Type 2 diabetes mellitus with polyneuropathy (HCC)      Plan:  Patient was evaluated and treated and all questions answered.  All symptomatic hyperkeratoses were safely debrided with a sterile #15 blade to patient's level of comfort without incident. We discussed preventative and palliative care of these lesions including supportive and accommodative shoegear, padding, prefabricated and custom molded accommodative orthoses, use of a pumice stone and lotions/creams daily.    Ulcer left hallux -We discussed the etiology and factors that are a part of the wound healing process.  We also discussed the risk of infection both soft tissue and osteomyelitis from open ulceration.  Discussed the risk of limb loss if this happens or worsens. -Debridement as below. -Dressed with Iodosorb, DSD. -Continue home dressing changes daily with 4 x 4 gauze and mupirocin ointment, Rx sent to pharmacy, we reviewed its  use -Continue off-loading with surgical shoe and peg assist device. -Vascular testing she has palpable pulses, considering her slow wound healing I did recommend noninvasive vascular test and this was ordered -HgbA1c: 10.1% -Last antibiotics: Refill doxycycline today for 1 week with fluconazole as a precaution that this discoloration is not impending infection -Imaging: x-ray reviewed, shows no signs of erosions, osteolysis, osteomyelitis or emphysema. -I think biggest issue here is her A1c as well as pressure on the wound.  I recommended offloading surgical shoe.  This was dispensed today.  She should be ambulatory in this.  Procedure: Excisional Debridement of Wound Rationale: Removal of non-viable soft tissue from the wound to promote healing.  Anesthesia: none Post-Debridement Wound Measurements: Noted above Type of Debridement: Sharp Excisional Tissue Removed: Non-viable soft tissue Depth of Debridement: subcutaneous tissue. Technique: Sharp excisional debridement to bleeding, viable wound base.  Dressing: Dry, sterile, compression dressing. Disposition: Patient tolerated procedure well.       Return in about 3 weeks (around 12/23/2022) for wound care.

## 2022-12-07 ENCOUNTER — Other Ambulatory Visit: Payer: Self-pay

## 2022-12-07 ENCOUNTER — Other Ambulatory Visit: Payer: Self-pay | Admitting: Nurse Practitioner

## 2022-12-07 ENCOUNTER — Other Ambulatory Visit: Payer: Self-pay | Admitting: Pharmacist

## 2022-12-07 ENCOUNTER — Ambulatory Visit: Payer: Self-pay | Admitting: *Deleted

## 2022-12-07 DIAGNOSIS — K219 Gastro-esophageal reflux disease without esophagitis: Secondary | ICD-10-CM

## 2022-12-07 DIAGNOSIS — E1165 Type 2 diabetes mellitus with hyperglycemia: Secondary | ICD-10-CM

## 2022-12-07 MED ORDER — OZEMPIC (0.25 OR 0.5 MG/DOSE) 2 MG/3ML ~~LOC~~ SOPN
0.5000 mg | PEN_INJECTOR | SUBCUTANEOUS | 0 refills | Status: DC
Start: 2022-12-07 — End: 2023-04-18
  Filled 2022-12-07: qty 3, 28d supply, fill #0

## 2022-12-07 MED ORDER — SEMAGLUTIDE (1 MG/DOSE) 4 MG/3ML ~~LOC~~ SOPN
1.0000 mg | PEN_INJECTOR | SUBCUTANEOUS | 1 refills | Status: DC
  Filled 2022-12-07 – 2023-01-21 (×3): qty 3, 28d supply, fill #0

## 2022-12-07 NOTE — Telephone Encounter (Signed)
Per Pharmacist  Call palced to patient. She has tolerated the 0.25 mg dose well. Denies any NV, abdominal pain. No vision changes reported. She denies any hypoglycemia.  Instructed her to increase to the 0.5 mg weekly dose. She will complete 4 doses (4 weeks) of the 0.5mg  and then increase further to 1 mg once weekly thereafter. Rxns sent to our pharmacy downstairs.

## 2022-12-07 NOTE — Telephone Encounter (Signed)
Message from Leonia Reeves sent at 12/07/2022 11:17 AM EDT  Summary: rx concern   The patient would like to be contacted by a member of staff when possible to discuss their Rx #: [id number] Semaglutide,0.25 or 0.5MG /DOS, (OZEMPIC, 0.25 OR 0.5 MG/DOSE,) 2 MG/3ML SOPN [[id number]] prescription and it's effectiveness  The patient would like to know when they're supposed to increase their medication  Please contact further when possible  ----- Message from Leonia Reeves sent at 12/07/2022 11:17 AM EDT ----- The patient would like to be contacted by a member of staff when possible to discuss their Rx #: [id number] Semaglutide,0.25 or 0.5MG /DOS, (OZEMPIC, 0.25 OR 0.5 MG/DOSE,) 2 MG/3ML SOPN [[id number]] prescription and it's effectiveness  The patient would like to know when they're supposed to increase their medication  Please contact further when possible         Reason for Disposition  [1] Caller has URGENT medicine question about med that PCP or specialist prescribed AND [2] triager unable to answer question  Answer Assessment - Initial Assessment Questions 1. NAME of MEDICINE: "What medicine(s) are you calling about?"     Ozempic 0.25 or 0.5 mg/Dose 2. QUESTION: "What is your question?" (e.g., double dose of medicine, side effect)     Pt wanting to discuss the effectiveness of it and when they are supposed to increase the dose. 3. PRESCRIBER: "Who prescribed the medicine?" Reason: if prescribed by specialist, call should be referred to that group.     Bertram Denver, NP 4. SYMPTOMS: "Do you have any symptoms?" If Yes, ask: "What symptoms are you having?"  "How bad are the symptoms (e.g., mild, moderate, severe)     N/A 5. PREGNANCY:  "Is there any chance that you are pregnant?" "When was your last menstrual period?"     N/A  Protocols used: Medication Question Call-A-AH  Chief Complaint: Pt has questions regarding the effectiveness of the Ozempic and when she should increase the  dose. Symptoms: N/A Frequency: N/A Pertinent Negatives: Patient denies N/A Disposition: [] ED /[] Urgent Care (no appt availability in office) / [] Appointment(In office/virtual)/ []  [place]/ [] Home Care/ [] Refused Recommended Disposition /[] [facility]/ [x]  Follow-up with PCP Additional Notes: See note from pt with questions about Ozempic.

## 2022-12-08 ENCOUNTER — Other Ambulatory Visit: Payer: Self-pay

## 2022-12-08 MED ORDER — PANTOPRAZOLE SODIUM 40 MG PO TBEC
40.0000 mg | DELAYED_RELEASE_TABLET | Freq: Two times a day (BID) | ORAL | 0 refills | Status: DC
Start: 2022-12-08 — End: 2023-03-01
  Filled 2022-12-08: qty 180, 90d supply, fill #0

## 2022-12-16 ENCOUNTER — Encounter: Payer: Self-pay | Admitting: Dietician

## 2022-12-16 ENCOUNTER — Encounter: Payer: Medicaid Other | Attending: Nurse Practitioner | Admitting: Dietician

## 2022-12-16 VITALS — Ht 70.0 in | Wt 303.0 lb

## 2022-12-16 DIAGNOSIS — E1142 Type 2 diabetes mellitus with diabetic polyneuropathy: Secondary | ICD-10-CM | POA: Insufficient documentation

## 2022-12-16 NOTE — Progress Notes (Signed)
Diabetes Self-Management Education  Visit Type: First/Initial  Appt. Start Time: 1100 Appt. End Time: 1215  12/21/2022  [name], identified by name and date of birth, is a 57 y.o. female with a diagnosis of Diabetes: Type 2.   ASSESSMENT 1100 Patient is here today alone.  She last saw an RD in our office in 2013. She states that she is a snacker (7 pm).  Started Ozempic which has decreased her appetite. At the beginning of the year she started a 7 day challenge ( no red meat, no pasta, no sweets, no soda, drink 8 bottles of water, exercise 30 minutes daily, eat increased greens).  Took a break then started this again in February for 2 weeks but stated that she fell off in March and was craving increased sweets.  She was counting calories and kept to 1500-1700 calories. She would like more information about diabetes.  She has had previous education in the past.  She would like to be more cautious about her health.  She would like to improve her blood glucose.  Referral:  Type 2 Diabetes (1988), HLD, HTN, OSA - no C-pap, hypothyroid, GERD, IBS History includes:  Type 2 Diabetes, toe ulcer (currently wearing a boot) Medications include:  Glipizide, Semglee YFGN 45 units q am and 40 units q HS, Semaglutide 0.5, fenofibrate, rosuvastatin, lisinopril, MVI, vitamin D, Vitamin C, methimazole Sleep:  4 hours per night Labs:  A1C 10.1% 10/20/2022 increased from 8.7% 02/2020/2023  CGM:  Dexcom G7 but hasn't started this yet. CGM sample in the office.  Patient was able to start this via her phone app and applied successfully to her arm.  70" 303 lbs (2 lbs removed for boot) 312 lb 10/22/2022  Lives alone.  No longer working.  Currently in the process of working on disability.  Emotional eater. She enjoys cooking and finds this therapeutic.  She is cooking for her son and family twice per week. Unable to walk much due to her boot for foot wound. Height 5\' 10"  (1.778 m), weight (!) 303 lb  (137.4 kg), last menstrual period 08/09/2012. Body mass index is 43.48 kg/m.   Diabetes Self-Management Education - 12/16/22 1136       Visit Information   Visit Type First/Initial      Initial Visit   Diabetes Type Type 2    Date Diagnosed 1988    Are you currently following a meal plan? No    Are you taking your medications as prescribed? Yes      Health Coping   How would you rate your overall health? Good      Psychosocial Assessment   Patient Belief/Attitude about Diabetes Motivated to manage diabetes    What is the hardest part about your diabetes right now, causing you the most concern, or is the most worrisome to you about your diabetes?   Being active    Self-care barriers None    Self-management support Doctor's office;CDE visits    Other persons present Patient    Patient Concerns Nutrition/Meal planning;Glycemic Control;Healthy Lifestyle;Problem Solving;Monitoring    Special Needs None    Preferred Learning Style No preference indicated    Learning Readiness Not Ready    How often do you need to have someone help you when you read instructions, pamphlets, or other written materials from your doctor or pharmacy? 1 - Never    What is the last grade level you completed in school? 2 years college  Pre-Education Assessment   Patient understands the diabetes disease and treatment process. Needs Review    Patient understands incorporating nutritional management into lifestyle. Needs Review    Patient undertands incorporating physical activity into lifestyle. Needs Review    Patient understands using medications safely. Needs Review    Patient understands monitoring blood glucose, interpreting and using results Needs Review    Patient understands prevention, detection, and treatment of acute complications. Needs Review    Patient understands prevention, detection, and treatment of chronic complications. Needs Review    Patient understands how to develop strategies to  address psychosocial issues. Needs Review    Patient understands how to develop strategies to promote health/change behavior. Needs Review      Complications   Last HgB A1C per patient/outside source 10.1 %   10/20/2022 increased from 8.7% 09/2021   How often do you check your blood sugar? > 4 times/day    Fasting Blood glucose range (mg/dL) 16-109    Postprandial Blood glucose range (mg/dL) 604-540;>981;191-478    Number of hypoglycemic episodes per month 1    Can you tell when your blood sugar is low? Yes    What do you do if your blood sugar is low? eats something    Number of hyperglycemic episodes ( >200mg /dL): Daily    Can you tell when your blood sugar is high? Yes    Have you had a dilated eye exam in the past 12 months? Yes    Have you had a dental exam in the past 12 months? No    Are you checking your feet? Yes    How many days per week are you checking your feet? 7      Dietary Intake   Breakfast omelet (2-3 eggs, vegetables), bacon, coffee with splenda and powdered creamer   late   Snack (morning) greek yogurt occasionally    Lunch none    Snack (afternoon) none    Dinner knorr rice, chicken legs, green beans    Snack (evening) peanuts    Beverage(s) water, coffee with splenda and powdered creamer, flavor packets, diet juice, occasional diet gingerale      Activity / Exercise   Activity / Exercise Type Light (walking / raking leaves);ADL's    How many days per week do you exercise? 0    How many minutes per day do you exercise? 0    Total minutes per week of exercise 0      Patient Education   Previous Diabetes Education Yes (please comment)   2013   Disease Pathophysiology Definition of diabetes, type 1 and 2, and the diagnosis of diabetes    Healthy Eating Role of diet in the treatment of diabetes and the relationship between the three main macronutrients and blood glucose level;Food label reading, portion sizes and measuring food.;Meal options for control of blood  glucose level and chronic complications.    Being Active Role of exercise on diabetes management, blood pressure control and cardiac health.;Helped patient identify appropriate exercises in relation to his/her diabetes, diabetes complications and other health issue.    Medications Reviewed patients medication for diabetes, action, purpose, timing of dose and side effects.    Monitoring Taught/evaluated CGM (comment);Identified appropriate SMBG and/or A1C goals.;Taught/discussed recording of test results and interpretation of SMBG.;Daily foot exams;Yearly dilated eye exam    Acute complications Taught prevention, symptoms, and  treatment of hypoglycemia - the 15 rule.;Discussed and identified patients' prevention, symptoms, and treatment of hyperglycemia.    Chronic  complications Relationship between chronic complications and blood glucose control;Identified and discussed with patient  current chronic complications    Diabetes Stress and Support Identified and addressed patients feelings and concerns about diabetes;Worked with patient to identify barriers to care and solutions;Role of stress on diabetes      Individualized Goals (developed by patient)   Nutrition General guidelines for healthy choices and portions discussed    Physical Activity Exercise 5-7 days per week;15 minutes per day    Medications take my medication as prescribed    Monitoring  Consistenly use CGM    Problem Solving Eating Pattern;Addressing barriers to behavior change    Reducing Risk examine blood glucose patterns;do foot checks daily;treat hypoglycemia with 15 grams of carbs if blood glucose less than 70mg /dL    Health Coping Discuss barriers to diabetes care with support person/system (comment specifics as needed)      Post-Education Assessment   Patient understands the diabetes disease and treatment process. Demonstrates understanding / competency    Patient understands incorporating nutritional management into  lifestyle. Needs Review    Patient undertands incorporating physical activity into lifestyle. Demonstrates understanding / competency    Patient understands using medications safely. Demonstrates understanding / competency    Patient understands monitoring blood glucose, interpreting and using results Comprehends key points    Patient understands prevention, detection, and treatment of acute complications. Demonstrates understanding / competency    Patient understands prevention, detection, and treatment of chronic complications. Demonstrates understanding / competency    Patient understands how to develop strategies to address psychosocial issues. Demonstrates understanding / competency    Patient understands how to develop strategies to promote health/change behavior. Needs Review      Outcomes   Expected Outcomes Demonstrated interest in learning. Expect positive outcomes    Future DMSE 2 months    Program Status Not Completed             Individualized Plan for Diabetes Self-Management Training:   Learning Objective:  Patient will have a greater understanding of diabetes self-management. Patient education plan is to attend individual and/or group sessions per assessed needs and concerns.   Plan:   Patient Instructions  Consider food free zones.    Aim to be active most days.  Do exercises that are appropriate  based on your foot wound. Plan:  Aim for 2-3 Carb Choices per meal (30-45 grams) +/- 1 either way  Aim for 0-2 Carbs per snack if hungry  Include protein in moderation with your meals and snacks Consider reading food labels for Total Carbohydrate of foods Consider  increasing your activity level by armchair exercises for 15-30 minutes daily as tolerated Consider checking BG at alternate times per day  Consider taking medication as directed by MD  Mindfulness:  Consistently scheduled meal - avoid skipping  Choices  Eat slowly  Away from distraction (sitting in  kitchen or dining room)  Stop eating when satisfied  Before a snack ask, "Am I hungry or eating for another reason?"   "What can I do instead if I am not hungry?"  Try to find something every day that brings you joy!   Blood glucose goals:  80-130 fasting   100-180 two hours after starting any meal  Generally a rise of 40-60 points after a meal is normal  A1C Goal:   Less than 7%.  Your last A1C was  Lab Results  Component Value Date   HGBA1C 10.1 (A) 10/20/2022     Expected Outcomes:  Demonstrated interest in learning. Expect positive outcomes  Education material provided: ADA - How to Thrive: A Guide for Your Journey with Diabetes, Meal plan card, Snack sheet, and Diabetes Resources  If problems or questions, patient to contact team via:  Phone  Future DSME appointment: 2 months

## 2022-12-16 NOTE — Patient Instructions (Addendum)
Consider food free zones.    Aim to be active most days.  Do exercises that are appropriate  based on your foot wound. Plan:  Aim for 2-3 Carb Choices per meal (30-45 grams) +/- 1 either way  Aim for 0-2 Carbs per snack if hungry  Include protein in moderation with your meals and snacks Consider reading food labels for Total Carbohydrate of foods Consider  increasing your activity level by armchair exercises for 15-30 minutes daily as tolerated Consider checking BG at alternate times per day  Consider taking medication as directed by MD  Mindfulness:  Consistently scheduled meal - avoid skipping  Choices  Eat slowly  Away from distraction (sitting in kitchen or dining room)  Stop eating when satisfied  Before a snack ask, "Am I hungry or eating for another reason?"   "What can I do instead if I am not hungry?"  Try to find something every day that brings you joy!   Blood glucose goals:  80-130 fasting   100-180 two hours after starting any meal  Generally a rise of 40-60 points after a meal is normal  A1C Goal:   Less than 7%.  Your last A1C was  Lab Results  Component Value Date   HGBA1C 10.1 (A) 10/20/2022

## 2022-12-23 DIAGNOSIS — H25813 Combined forms of age-related cataract, bilateral: Secondary | ICD-10-CM | POA: Insufficient documentation

## 2022-12-23 LAB — HM DIABETES EYE EXAM

## 2022-12-30 ENCOUNTER — Encounter: Payer: Self-pay | Admitting: Hematology

## 2022-12-30 ENCOUNTER — Ambulatory Visit (INDEPENDENT_AMBULATORY_CARE_PROVIDER_SITE_OTHER): Payer: Medicaid Other | Admitting: Podiatry

## 2022-12-30 DIAGNOSIS — E1142 Type 2 diabetes mellitus with diabetic polyneuropathy: Secondary | ICD-10-CM

## 2022-12-30 DIAGNOSIS — E11621 Type 2 diabetes mellitus with foot ulcer: Secondary | ICD-10-CM | POA: Diagnosis not present

## 2022-12-30 DIAGNOSIS — L97529 Non-pressure chronic ulcer of other part of left foot with unspecified severity: Secondary | ICD-10-CM | POA: Diagnosis not present

## 2022-12-31 NOTE — Progress Notes (Signed)
Subjective:  Patient ID: Kristina Adams, female    DOB: [birth year],  MRN: [id number]  Chief Complaint  Patient presents with   Nail Problem    Pt states she was here before for an Ulcer today she is here for a recheck    57 y.o. female presents for follow-up on left great toe ulceration.  She is still being treated by Dr. Lilian Kapur.  She says the toe is looking much better and the color is gone back to normal.  Denies any drainage.  Has been covering with a Band-Aid.  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/03/2011   GERD (gastroesophageal reflux disease)    H/O hiatal hernia    HLD (hyperlipidemia)    Hypertension 12/03/2011   Hyperthyroidism    IBS (irritable bowel syndrome)    Morbid obesity with BMI of 45.0-49.9, adult (HCC) 12/03/2011   Sleep apnea 12/03/2011   last sleep study May 2013    Allergies  Allergen Reactions   Latex Other (See Comments)    Infection in left middle finger from latex gloves due to moisture.   Augmentin [Amoxicillin-Pot Clavulanate] Itching    Burning and itching, back pains, elevated sugar levels   Iohexol      Code: RASH, Desc: PATIENT CALLED 06/17/08-STATED AFTER 6 HOURS POST IV CONTRAST, HANDS,SCALP AND GROIN AREA DEVELOPED ITCHING,BURNING SENSATION, Onset Date: [id number]     ROS: Negative except as per HPI above  Objective:  General: AAO x3, NAD  Dermatological: Prior ulceration to the medial and lateral aspect of the left hallux have now healed then with healthy tissue present no evidence of ulceration at this time.  There is some hyperkeratotic tissue at the tip of the toe as well as the side of the toe that was debrided.  No underlying ulceration.  No erythema edema or drainage  Vascular:  Dorsalis Pedis  artery and Posterior Tibial artery pedal pulses are 2/4 bilateral.  Capillary fill time < 3 sec to all digits.   Neruologic: Grossly diminished at the distal aspect of the left hallux  Musculoskeletal: No gross boney pedal deformities bilateral. No pain, crepitus, or limitation noted with foot and ankle range of motion bilateral. Muscular strength 5/5 in all groups tested bilateral.  Gait: Unassisted, Nonantalgic.   No images are attached to the encounter.  Radiographs:  Deferred Assessment:   1. 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 Type 2 diabetes mellitus with polyneuropathy (HCC)      Plan:  Patient was evaluated and treated and all questions answered.  # Ulcer at the medial lateral aspect of the left great toe -Appears improved after offloading in the postop shoe as well as local wound care -No evidence of infection at this visit -No debridement indicated as wounds are now fully healed did remove some hyperkeratotic tissue from the distal tuft of the toe -Patient cannot get back to ambulating in regular shoes -Monitor for recurrence of ulcerations -Continue with vascular noninvasive screening as previously scheduled -Patient has routine follow-up with Dr. Eloy End.  If there is any concerns with the toe at that visit she will refer the patient back to Dr. Lilian Kapur or myself for ongoing wound care as needed  No follow-ups on file.          Corinna Gab, DPM [facility] / [facility]

## 2023-01-03 ENCOUNTER — Ambulatory Visit (HOSPITAL_COMMUNITY)
Admission: RE | Admit: 2023-01-03 | Discharge: 2023-01-03 | Disposition: A | Discharge status: Home / Self Care | Payer: Medicaid Other | Source: Ambulatory Visit | Attending: Podiatry | Admitting: Podiatry

## 2023-01-03 DIAGNOSIS — E1142 Type 2 diabetes mellitus with diabetic polyneuropathy: Secondary | ICD-10-CM | POA: Diagnosis present

## 2023-01-03 DIAGNOSIS — L97529 Non-pressure chronic ulcer of other part of left foot with unspecified severity: Secondary | ICD-10-CM | POA: Diagnosis not present

## 2023-01-03 DIAGNOSIS — E11621 Type 2 diabetes mellitus with foot ulcer: Secondary | ICD-10-CM | POA: Insufficient documentation

## 2023-01-03 LAB — VAS US PAD ABI
Left ABI: 0.9
Right ABI: 0.92

## 2023-01-07 ENCOUNTER — Other Ambulatory Visit: Payer: Self-pay | Admitting: Podiatry

## 2023-01-07 DIAGNOSIS — E11621 Type 2 diabetes mellitus with foot ulcer: Secondary | ICD-10-CM

## 2023-01-10 ENCOUNTER — Other Ambulatory Visit: Payer: Self-pay | Admitting: Pharmacist

## 2023-01-10 ENCOUNTER — Telehealth: Payer: Self-pay | Admitting: Nurse Practitioner

## 2023-01-10 ENCOUNTER — Other Ambulatory Visit: Payer: Self-pay | Admitting: Family Medicine

## 2023-01-10 ENCOUNTER — Encounter: Payer: Self-pay | Admitting: Hematology

## 2023-01-10 ENCOUNTER — Other Ambulatory Visit: Payer: Self-pay

## 2023-01-10 MED ORDER — LANTUS SOLOSTAR 100 UNIT/ML ~~LOC~~ SOPN
PEN_INJECTOR | SUBCUTANEOUS | 2 refills | Status: DC
  Filled 2023-01-10: qty 30, 35d supply, fill #0

## 2023-01-10 MED ORDER — LANTUS SOLOSTAR 100 UNIT/ML ~~LOC~~ SOPN
PEN_INJECTOR | SUBCUTANEOUS | 1 refills | Status: DC
  Filled 2023-01-11: qty 75, 88d supply, fill #0
  Filled 2023-04-05: qty 75, 88d supply, fill #1

## 2023-01-10 NOTE — Telephone Encounter (Signed)
Rxn resent for 90-day supply.

## 2023-01-10 NOTE — Telephone Encounter (Signed)
Medication Refill - Medication: insulin glargine (LANTUS SOLOSTAR) 100 UNIT/ML Solostar Pen Pt states that she is use to taking Semglee and is use to getting a 90 day supply and was told by the pharmacy that the prescription that was sent it is a match to the [place]. Pt is requesting a 90 day supply for the new prescription and she is needing it by tomorrow since she will be leaving to go out of town on Wednesday 01/12/2023.  Pt is wanting to know if she need to go back to [facility] since she can get a 90 day supply?   Has the patient contacted their pharmacy? Yes.   Pharmacy advised pt to call her PCP.   Preferred Pharmacy (with phone number or street name):  [facility]  Phone: [phone] Fax: [phone]  Has the patient been seen for an appointment in the last year OR does the patient have an upcoming appointment? Yes.    Agent: Please be advised that RX refills may take up to 3 business days. We ask that you follow-up with your pharmacy.

## 2023-01-11 ENCOUNTER — Encounter: Payer: Self-pay | Admitting: Hematology

## 2023-01-11 ENCOUNTER — Other Ambulatory Visit: Payer: Self-pay

## 2023-01-14 ENCOUNTER — Other Ambulatory Visit: Payer: Self-pay

## 2023-01-17 ENCOUNTER — Other Ambulatory Visit: Payer: Self-pay

## 2023-01-18 ENCOUNTER — Other Ambulatory Visit: Payer: Self-pay | Admitting: Family Medicine

## 2023-01-18 ENCOUNTER — Ambulatory Visit: Payer: Medicaid Other | Admitting: Podiatry

## 2023-01-18 DIAGNOSIS — M79674 Pain in right toe(s): Secondary | ICD-10-CM

## 2023-01-18 DIAGNOSIS — B351 Tinea unguium: Secondary | ICD-10-CM

## 2023-01-18 DIAGNOSIS — E1165 Type 2 diabetes mellitus with hyperglycemia: Secondary | ICD-10-CM

## 2023-01-18 DIAGNOSIS — M79675 Pain in left toe(s): Secondary | ICD-10-CM

## 2023-01-18 DIAGNOSIS — E1142 Type 2 diabetes mellitus with diabetic polyneuropathy: Secondary | ICD-10-CM | POA: Diagnosis not present

## 2023-01-18 DIAGNOSIS — Z8631 Personal history of diabetic foot ulcer: Secondary | ICD-10-CM | POA: Diagnosis not present

## 2023-01-18 DIAGNOSIS — L84 Corns and callosities: Secondary | ICD-10-CM | POA: Diagnosis not present

## 2023-01-19 ENCOUNTER — Encounter: Payer: Self-pay | Admitting: Vascular Surgery

## 2023-01-19 ENCOUNTER — Ambulatory Visit (INDEPENDENT_AMBULATORY_CARE_PROVIDER_SITE_OTHER): Payer: Medicaid Other | Admitting: Vascular Surgery

## 2023-01-19 ENCOUNTER — Other Ambulatory Visit: Payer: Self-pay

## 2023-01-19 ENCOUNTER — Encounter: Payer: Self-pay | Admitting: Hematology

## 2023-01-19 ENCOUNTER — Other Ambulatory Visit (HOSPITAL_COMMUNITY): Payer: Self-pay

## 2023-01-19 VITALS — BP 145/72 | HR 73 | Temp 97.9°F | Resp 20 | Ht 70.0 in | Wt 312.0 lb

## 2023-01-19 DIAGNOSIS — L97529 Non-pressure chronic ulcer of other part of left foot with unspecified severity: Secondary | ICD-10-CM

## 2023-01-19 DIAGNOSIS — E11621 Type 2 diabetes mellitus with foot ulcer: Secondary | ICD-10-CM

## 2023-01-19 NOTE — Progress Notes (Signed)
Patient ID: Kristina Adams, female   DOB: 01/29/1966, 57 y.o.   MRN: [id number]  Reason for Consult: New Patient (Initial Visit)   Referred by Vivi Barrack, DPM  Subjective:     HPI:  Kristina Adams is a 57 y.o. female with history of does have hypertension, hyperlipidemia and type 2 diabetes.  She is a non-smoker does not take any antiplatelet or anticoagulation medications.  Recently had a left great toe ulcer which has healed with the wound care at [facility].  She does have discoloration of the toe and has recent ABI study which demonstrated decreased ABI from normal and is now sent for further evaluation.  No history of stroke, TIA or amaurosis.  She denies claudication though she is not as active as she states she should be.  She is attempting to lose weight.  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/03/2011   GERD (gastroesophageal reflux disease)    H/O hiatal hernia    HLD (hyperlipidemia)    Hypertension 12/03/2011   Hyperthyroidism    IBS (irritable bowel syndrome)    Morbid obesity with BMI of 45.0-49.9, adult (HCC) 12/03/2011   Sleep apnea 12/03/2011   last sleep study May 2013   Family History  Problem Relation Age of Onset   Breast cancer Mother 2   Diabetes Mother    Hypertension Mother    Kidney failure Father    Heart disease Father        No details   Diabetes Sister    Hypertension Sister    Hyperlipidemia Sister    Diabetes Maternal Uncle    Cancer Maternal Uncle        type unknown   Diabetes Paternal Aunt    Hypertension Paternal Aunt    Breast cancer Paternal Aunt    Fibromyalgia Paternal Aunt    Breast cancer Maternal Grandmother    Hypertension Maternal Grandmother    Kidney disease Maternal Grandfather     Cancer Maternal Grandfather        type unknown   Diabetes Paternal Grandmother    Diabetes Paternal Grandfather    Diabetes Daughter    Colon cancer Neg Hx    Esophageal cancer Neg Hx    Inflammatory bowel disease Neg Hx    Liver disease Neg Hx    Pancreatic cancer Neg Hx    Rectal cancer Neg Hx    Stomach cancer Neg Hx    Past Surgical History:  Procedure Laterality Date   BIOPSY  07/29/2022   Procedure: BIOPSY;  Surgeon: Meridee Score, Netty Starring., MD;  Location: [facility];  Service: Gastroenterology;;   CESAREAN SECTION     x 2   COLONOSCOPY WITH PROPOFOL N/A 07/29/2022   Procedure: COLONOSCOPY WITH PROPOFOL;  Surgeon: Lemar Lofty., MD;  Location: [facility];  Service: Gastroenterology;  Laterality: N/A;   ESOPHAGOGASTRODUODENOSCOPY (EGD) WITH PROPOFOL N/A 07/29/2022   Procedure: ESOPHAGOGASTRODUODENOSCOPY (EGD) WITH PROPOFOL;  Surgeon: Meridee Score Netty Starring., MD;  Location: [facility];  Service: Gastroenterology;  Laterality: N/A;   EUS N/A 07/29/2022   Procedure: UPPER ENDOSCOPIC ULTRASOUND (EUS) LINEAR;  Surgeon: Lemar Lofty., MD;  Location: [facility];  Service: Gastroenterology;  Laterality: N/A;  FINE NEEDLE ASPIRATION N/A 07/29/2022   Procedure: FINE NEEDLE ASPIRATION (FNA) LINEAR;  Surgeon: Lemar Lofty., MD;  Location: [facility];  Service: Gastroenterology;  Laterality: N/A;   LAPAROSCOPIC APPENDECTOMY  12/03/2011   Procedure: APPENDECTOMY LAPAROSCOPIC;  Surgeon: Lodema Pilot, DO;  Location: [facility];  Service: General;  Laterality: N/A;  drainage of intra-abdominal abscess    PARS PLANA VITRECTOMY Left 08/17/2012   Procedure: PARS PLANA VITRECTOMY WITH 25 GAUGE;  Surgeon: Sherrie George, MD;  Location: [facility] OR;  Service: Ophthalmology;  Laterality: Left;   POLYPECTOMY  07/29/2022   Procedure: POLYPECTOMY;  Surgeon: Mansouraty, Netty Starring., MD;  Location: [facility];  Service: Gastroenterology;;   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   Procedure: REPAIR OF COMPLEX TRACTION RETINAL DETACHMENT;  Surgeon: Sherrie George, MD;  Location: [facility] OR;  Service: Ophthalmology;  Laterality: Left;   TUBAL LIGATION      Short Social History:  Social History   Tobacco Use   Smoking status: Some Days    Types: Cigars   Smokeless tobacco: Never  Substance Use Topics   Alcohol use: Not Currently    Alcohol/week: 1.0 standard drink of alcohol    Types: 1 Glasses of wine per week    Comment: occasional    Allergies  Allergen Reactions   Latex Other (See Comments)    Infection in left middle finger from latex gloves due to moisture.   Augmentin [Amoxicillin-Pot Clavulanate] Itching    Burning and itching, back pains, elevated sugar levels   Iohexol      Code: RASH, Desc: PATIENT CALLED 06/17/08-STATED AFTER 6 HOURS POST IV CONTRAST, HANDS,SCALP AND GROIN AREA DEVELOPED ITCHING,BURNING SENSATION, Onset Date: [id number]     Current Outpatient Medications  Medication Sig Dispense Refill   blood glucose meter kit and supplies KIT Use up to 2 times daily as directed. 1 each 0   Blood Pressure Monitor DEVI Please provide patient with insurance approved blood pressure monitor 1 each 0   Cholecalciferol (VITAMIN D-3) 125 MCG (5000 UT) TABS Take 1 tablet by mouth daily. 90 tablet 3   Continuous Blood Gluc Receiver (DEXCOM G7 RECEIVER) DEVI Use to check blood sugar continuously throughout the day. E11.9 1 each 0   Continuous Glucose Sensor (DEXCOM G7 SENSOR) MISC Use to check blood sugar continuously throughout the day. E11.9 3 each 6   cyclobenzaprine (FLEXERIL) 10 MG tablet TAKE 1 TABLET (10 MG TOTAL) BY MOUTH 3 (THREE) TIMES DAILY AS NEEDED FOR MUSCLE SPASMS. 90 tablet 2   diclofenac Sodium (VOLTAREN) 1 % GEL APPLY 4 G TOPICALLY 4 (FOUR) TIMES DAILY. (Patient taking differently: Apply 4 g topically daily as needed (Muscle spasm).) 300 g 2   fenofibrate 160 MG tablet TAKE 1 TABLET (160 MG TOTAL) BY MOUTH DAILY.  90 tablet 3   gabapentin (NEURONTIN) 300 MG capsule Take 1 capsule (300 mg total) by mouth at bedtime. 270 capsule 2   glipiZIDE (GLUCOTROL) 10 MG tablet Take 1 tablet (10 mg total) by mouth 2 (two) times daily before a meal. 180 tablet 1   glucose blood (TRUE METRIX BLOOD GLUCOSE TEST) test strip Testing twice daily. 200 each 1   hydrochlorothiazide (HYDRODIURIL) 25 MG tablet TAKE 1 TABLET (25 MG TOTAL) BY MOUTH DAILY. 90 tablet 1   insulin glargine (LANTUS SOLOSTAR) 100 UNIT/ML Solostar Pen Inject 45 Units into the skin in the morning AND 40 Units every evening. 75 mL 1   Insulin Pen Needle (B-D UF  III MINI PEN NEEDLES) 31G X 5 MM MISC Use as instructed. Inject into the skin twice daily. NEEDS PASS 100 each 6   Lancets (ONETOUCH DELICA PLUS LANCET33G) MISC Testing twice daily 200 each 1   lisinopril (ZESTRIL) 40 MG tablet Take 1 tablet (40 mg total) by mouth daily. 90 tablet 1   methimazole (TAPAZOLE) 5 MG tablet Take 1 tablet (5 mg total) by mouth 2 (two) times daily. 180 tablet 0   Multiple Vitamin (MULTI-VITAMIN) tablet Take 1 tablet by mouth daily.     mupirocin ointment (BACTROBAN) 2 % Apply 1 Application topically daily. 22 g 2   pantoprazole (PROTONIX) 40 MG tablet Take 1 tablet (40 mg total) by mouth 2 (two) times daily. 180 tablet 0   rosuvastatin (CRESTOR) 5 MG tablet Take 1 tablet (5 mg total) by mouth daily. 90 tablet 3   Semaglutide, 1 MG/DOSE, 4 MG/3ML SOPN Inject 1 mg as directed once a week. Start once you complete 4 doses of the 0.5mg  pen. 3 mL 1   Semaglutide,0.25 or 0.5MG /DOS, (OZEMPIC, 0.25 OR 0.5 MG/DOSE,) 2 MG/3ML SOPN Inject 0.5 mg into the skin once a week. For 4 weeks. 3 mL 0   vitamin C (ASCORBIC ACID) 500 MG tablet Take 1,000 mg by mouth daily.     aspirin EC 81 MG tablet Take by mouth. (Patient not taking: Reported on 12/16/2022)     No current facility-administered medications for this visit.    Review of Systems  Constitutional:  Constitutional negative. HENT:  HENT negative.  Eyes: Eyes negative.  Respiratory: Respiratory negative.  Cardiovascular: Cardiovascular negative.  GI: Gastrointestinal negative.  Musculoskeletal: Musculoskeletal negative.  Skin:       Toe discoloration on the left Neurological: Neurological negative. Hematologic: Hematologic/lymphatic negative.  Psychiatric: Psychiatric negative.        Objective:  Objective   Vitals:   01/19/23 1137  BP: (!) 145/72  Pulse: 73  Resp: 20  Temp: 97.9 F (36.6 C)  SpO2: 97%  Weight: (!) 312 lb (141.5 kg)  Height: 5\' 10"  (1.778 m)   Body mass index is 44.77 kg/m.  Physical Exam HENT:     Head: Normocephalic.     Nose: Nose normal.  Eyes:     Pupils: Pupils are equal, round, and reactive to light.  Cardiovascular:     Rate and Rhythm: Normal rate.     Pulses: Normal pulses.  Pulmonary:     Effort: Pulmonary effort is normal.  Abdominal:     General: Abdomen is flat.     Palpations: Abdomen is soft.  Musculoskeletal:     Cervical back: Normal range of motion.     Comments: Dark discoloration left great toe  Skin:    Capillary Refill: Capillary refill takes less than 2 seconds.  Neurological:     General: No focal deficit present.     Mental Status: She is alert.  Psychiatric:        Mood and Affect: Mood normal.        Thought Content: Thought content normal.        Judgment: Judgment normal.     Data: ABI Findings:  +---------+------------------+-----+---------+--------+  Right   Rt Pressure (mmHg)IndexWaveform Comment   +---------+------------------+-----+---------+--------+  Brachial 167                                       +---------+------------------+-----+---------+--------+  PTA  165               0.92 triphasic          +---------+------------------+-----+---------+--------+  DP      162               0.90 triphasic          +---------+------------------+-----+---------+--------+  Great Toe141                0.78 Normal             +---------+------------------+-----+---------+--------+   +---------+------------------+-----+---------+-------+  Left    Lt Pressure (mmHg)IndexWaveform Comment  +---------+------------------+-----+---------+-------+  Brachial 180                                      +---------+------------------+-----+---------+-------+  PTA     162               0.90 triphasic         +---------+------------------+-----+---------+-------+  DP      141               0.78 triphasic         +---------+------------------+-----+---------+-------+  Great Toe102               0.57 Abnormal          +---------+------------------+-----+---------+-------+   +-------+-----------+-----------+------------+------------+  ABI/TBIToday's ABIToday's TBIPrevious ABIPrevious TBI  +-------+-----------+-----------+------------+------------+  Right 0.92       0.78                                 +-------+-----------+-----------+------------+------------+  Left  0.90       0.57                                 +-------+-----------+-----------+------------+------------+         Summary:  Right: Resting right ankle-brachial index indicates mild right lower  extremity arterial disease. The right toe-brachial index is normal.   Left: Resting left ankle-brachial index indicates mild left lower  extremity arterial disease. The left toe-brachial index is abnormal.       Assessment/Plan:    57 year old female with history of left great toe dark discoloration with an ulcer that is healed.  She does have sufficient toe pressures for wound healing.  I recommended protecting her toes wearing shoes and to continue follow-up with podiatry.  She would likely benefit from enteric-coated 81 mg aspirin daily given her risk factors for vascular disease other than that she can see me on an as-needed basis.    Maeola Harman MD [facility]

## 2023-01-20 ENCOUNTER — Other Ambulatory Visit: Payer: Self-pay

## 2023-01-21 ENCOUNTER — Other Ambulatory Visit: Payer: Self-pay

## 2023-01-21 ENCOUNTER — Other Ambulatory Visit (HOSPITAL_COMMUNITY): Payer: Self-pay

## 2023-01-23 ENCOUNTER — Encounter: Payer: Self-pay | Admitting: Podiatry

## 2023-01-23 NOTE — Progress Notes (Signed)
  Subjective:  Patient ID: Kristina Adams, female    DOB: 14-Jan-1966,  MRN: [id number]  Kristina Adams presents to clinic today for at risk foot care with history of diabetic neuropathy. Patient has h/o diabetic ulcer left hallux managed by Dr. Lilian Kapur. She has seen Vascular for evaluation as well and found to have abnormal toe pressure of left hallux. Digit has healed. Chief Complaint  Patient presents with   Nail Problem    rfc   New problem(s): None.   PCP is Claiborne Rigg, NP.  Allergies  Allergen Reactions   Latex Other (See Comments)    Infection in left middle finger from latex gloves due to moisture.   Augmentin [Amoxicillin-Pot Clavulanate] Itching    Burning and itching, back pains, elevated sugar levels   Iohexol      Code: RASH, Desc: PATIENT CALLED 06/17/08-STATED AFTER 6 HOURS POST IV CONTRAST, HANDS,SCALP AND GROIN AREA DEVELOPED ITCHING,BURNING SENSATION, Onset Date: [id number]     Review of Systems: Negative except as noted in the HPI.  Objective: No changes noted in today's physical examination. There were no vitals filed for this visit. Kristina Adams is a pleasant 57 y.o. female in NAD. AAO x 3.  Vascular Examination: CFT <3 seconds b/l LE. Palpable DP pulse(s) b/l LE. Palpable PT pulse(s) b/l LE. Pedal hair sparse. No pain with calf compression b/l. Nonpitting edema noted bilateral ankles.  Dermatological Examination: Pedal integument with normal turgor, texture and tone b/l LE. No open wounds b/l. No interdigital macerations b/l. Toenails 1-5 b/l elongated, thickened, discolored with subungual debris. +Tenderness with dorsal palpation of nailplates.   Left great toe ulcer healed with postinflammatory hyperpigmentation. Mild hyperkeratosis medial and lateral IPJ.   Preulcerative lesion(s) noted submet head 1 right foot and submet head 5 b/l.  Neurological Examination: Pt has subjective symptoms of neuropathy. Protective sensation intact 5/5 intact  bilaterally with 10g monofilament b/l. Vibratory sensation intact b/l.  Musculoskeletal Examination: Normal muscle strength 5/5 to all lower extremity muscle groups bilaterally. Hammertoe(s) noted to the bilateral 5th toes.. No pain, crepitus or joint limitation noted with ROM b/l LE.  Patient ambulates independently without assistive aids.     Assessment/Plan: 1. Pain due to onychomycosis of toenails of both feet   2. Healed diabetic foot ulcer   3. Pre-ulcerative calluses   4. Type 2 diabetes mellitus with polyneuropathy (HCC)    -Patient was evaluated and treated. All patient's and/or POA's questions/concerns answered on today's visit. -Diabetic ulcer left hallux has healed. Patient advised to avoid picking or pulling off any dry skin. Moisturize feet daily avoiding application of moisturizer between toes. She may apply Neosporin to left great toe. She needs closer monitoring due to h/o diabetic ulcer.I would like her to see Dr. Lilian Kapur in 9 weeks for diabetic foot care and see me in 18 weeks. -Patient to continue soft, supportive shoe gear daily. -Mycotic toenails 1-5 bilaterally were debrided in length and girth with sterile nail nippers and dremel without incident. -Callus(es) left great toe pared utilizing rotary bur without complication or incident. Total number pared =1. -Preulcerative lesion pared submet head 1 right foot and submet head 5 b/l utilizing sterile scalpel blade. Total number pared=3. -Patient/POA to call should there be question/concern in the interim.   Return in about 9 weeks (around 03/22/2023).  Kristina Adams, DPM

## 2023-01-24 ENCOUNTER — Encounter: Payer: Self-pay | Admitting: Hematology

## 2023-01-26 ENCOUNTER — Encounter: Payer: Self-pay | Admitting: Hematology

## 2023-01-26 ENCOUNTER — Encounter: Payer: Self-pay | Admitting: Gastroenterology

## 2023-01-26 ENCOUNTER — Ambulatory Visit (INDEPENDENT_AMBULATORY_CARE_PROVIDER_SITE_OTHER): Payer: Medicaid Other | Admitting: Gastroenterology

## 2023-01-26 ENCOUNTER — Other Ambulatory Visit: Payer: Self-pay

## 2023-01-26 VITALS — BP 130/68 | HR 76 | Ht 70.0 in | Wt 317.0 lb

## 2023-01-26 DIAGNOSIS — R1012 Left upper quadrant pain: Secondary | ICD-10-CM | POA: Diagnosis not present

## 2023-01-26 DIAGNOSIS — K219 Gastro-esophageal reflux disease without esophagitis: Secondary | ICD-10-CM

## 2023-01-26 DIAGNOSIS — K2100 Gastro-esophageal reflux disease with esophagitis, without bleeding: Secondary | ICD-10-CM

## 2023-01-26 DIAGNOSIS — K5909 Other constipation: Secondary | ICD-10-CM | POA: Diagnosis not present

## 2023-01-26 DIAGNOSIS — R140 Abdominal distension (gaseous): Secondary | ICD-10-CM | POA: Diagnosis not present

## 2023-01-26 NOTE — Patient Instructions (Addendum)
We have given you samples of the following medication to take: Linzess 145 mcg- once daily. ( Let us know if Linzess 145 mcg works and we will send prescription to pharmacy for you.   Send [place] a my chart message in 6 weeks with Gerd symptom updates. At that time if your symptoms are worse then Dr Jean Rosenthal will considering changing your acid reflux medication.   You may use Pepcid 20 mg at bedtime ,if needed to help with acid reflux symptoms.   Follow up on 05/24/23 at 11:30 am with Dr Meridee Score   _______________________________________________________  If your blood pressure at your visit was 140/90 or greater, please contact your primary care physician to follow up on this.  _______________________________________________________  If you are age 65 or older, your body mass index should be between 23-30. Your Body mass index is 45.48 kg/m. If this is out of the aforementioned range listed, please consider follow up with your Primary Care Provider.  If you are age 31 or younger, your body mass index should be between 19-25. Your Body mass index is 45.48 kg/m. If this is out of the aformentioned range listed, please consider follow up with your Primary Care Provider.   ________________________________________________________  The [facility] providers would like to encourage you to use [facility] to communicate with providers for non-urgent requests or questions.  Due to long hold times on the telephone, sending your provider a message by [facility] may be a faster and more efficient way to get a response.  Please allow 48 business hours for a response.  Please remember that this is for non-urgent requests.  _______________________________________________________  Thank you for choosing me and [facility].  Dr. Meridee Score

## 2023-01-26 NOTE — Progress Notes (Addendum)
GASTROENTEROLOGY OUTPATIENT CLINIC VISIT   Primary Care Provider Claiborne Rigg, NP [address] [place] [address] [place] [address] [phone]   Patient Profile: Kristina Adams is a 57 y.o. 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 pylori, colon polyps (TA's), chronic constipation.  The patient presents to the [facility] for an evaluation and management of problem(s) noted below:  Problem List 1. Chronic constipation   2. LUQ pain   3. Abdominal bloating   4. Gastroesophageal reflux disease without esophagitis     History of Present Illness Please see prior GI notes for full details of HPI.  Interval History The patient returns for follow-up.  She states that when she was using the Linzess samples, she noted a significant improvement in her constipation as well as left upper quadrant discomfort and abdominal bloating.  She would like to have an attempt at treatment with Linzess and thinks that she will be able to afford this.  Her H. pylori Diatherix testing returned negative earlier this year showing that she had complete eradication.  Her GERD symptoms have been increasing somewhat.  No overt dysphagia symptoms however.  GI Review of Systems Positive as above Negative for dysphagia, odynophagia, nausea, vomiting, melena, hematochezia  Review of Systems General: Denies fevers/chills/weight loss unintentionally Cardiovascular: Denies chest pain Pulmonary: Denies shortness of breath Gastroenterological: See HPI Genitourinary: Denies darkened urine  Hematological: Denies easy bruising/bleeding Endocrine: Denies temperature intolerance Dermatological: Denies jaundice Psychological: Mood is stable   Medications Current Outpatient Medications  Medication  Sig Dispense Refill   aspirin EC 81 MG tablet Take by mouth.     blood glucose meter kit and supplies KIT Use up to 2 times daily as directed. 1 each 0   Blood Pressure Monitor DEVI Please provide patient with insurance approved blood pressure monitor 1 each 0   Cholecalciferol (VITAMIN D-3) 125 MCG (5000 UT) TABS Take 1 tablet by mouth daily. 90 tablet 3   Continuous Blood Gluc Receiver (DEXCOM G7 RECEIVER) DEVI Use to check blood sugar continuously throughout the day. E11.9 1 each 0   Continuous Glucose Sensor (DEXCOM G7 SENSOR) MISC Use to check blood sugar continuously throughout the day. E11.9 3 each 6   cyclobenzaprine (FLEXERIL) 10 MG tablet TAKE 1 TABLET (10 MG TOTAL) BY MOUTH 3 (THREE) TIMES DAILY AS NEEDED FOR MUSCLE SPASMS. 90 tablet 2   diclofenac Sodium (VOLTAREN) 1 % GEL APPLY 4 G TOPICALLY 4 (FOUR) TIMES DAILY. (Patient taking differently: Apply 4 g topically daily as needed (Muscle spasm).) 300 g 2   fenofibrate 160 MG tablet TAKE 1 TABLET (160 MG TOTAL) BY MOUTH DAILY. 90 tablet 3   gabapentin (NEURONTIN) 300 MG capsule Take 1 capsule (300 mg total) by mouth at bedtime. 270 capsule 2   glipiZIDE (GLUCOTROL) 10 MG tablet Take 1 tablet (10 mg total) by mouth 2 (two) times daily before a meal. 180 tablet 1   glucose blood (TRUE METRIX BLOOD GLUCOSE TEST) test strip Testing twice daily. 200 each 1   hydrochlorothiazide (HYDRODIURIL) 25 MG tablet TAKE 1 TABLET (25 MG TOTAL) BY MOUTH DAILY. 90 tablet 1   insulin glargine (LANTUS SOLOSTAR) 100 UNIT/ML Solostar Pen Inject 45 Units into the skin in the morning AND 40 Units every evening. 75 mL 1   Insulin Pen Needle (B-D UF III MINI PEN NEEDLES)  31G X 5 MM MISC Use as instructed. Inject into the skin twice daily. NEEDS PASS 100 each 6   Lancets (ONETOUCH DELICA PLUS LANCET33G) MISC Testing twice daily 200 each 1   lisinopril (ZESTRIL) 40 MG tablet Take 1 tablet (40 mg total) by mouth daily. 90 tablet 1   methimazole (TAPAZOLE) 5 MG tablet  Take 1 tablet (5 mg total) by mouth 2 (two) times daily. 180 tablet 0   Multiple Vitamin (MULTI-VITAMIN) tablet Take 1 tablet by mouth daily.     mupirocin ointment (BACTROBAN) 2 % Apply 1 Application topically daily. 22 g 2   pantoprazole (PROTONIX) 40 MG tablet Take 1 tablet (40 mg total) by mouth 2 (two) times daily. 180 tablet 0   rosuvastatin (CRESTOR) 5 MG tablet Take 1 tablet (5 mg total) by mouth daily. 90 tablet 3   Semaglutide, 1 MG/DOSE, 4 MG/3ML SOPN Inject 1 mg as directed once a week. Start once you complete 4 doses of the 0.5mg  pen. 3 mL 1   Semaglutide,0.25 or 0.5MG /DOS, (OZEMPIC, 0.25 OR 0.5 MG/DOSE,) 2 MG/3ML SOPN Inject 0.5 mg into the skin once a week. For 4 weeks. 3 mL 0   vitamin C (ASCORBIC ACID) 500 MG tablet Take 1,000 mg by mouth daily.     No current facility-administered medications for this visit.    Allergies Allergies  Allergen Reactions   Latex Other (See Comments)    Infection in left middle finger from latex gloves due to moisture.   Augmentin [Amoxicillin-Pot Clavulanate] Itching    Burning and itching, back pains, elevated sugar levels   Iohexol      Code: RASH, Desc: PATIENT CALLED 06/17/08-STATED AFTER 6 HOURS POST IV CONTRAST, HANDS,SCALP AND GROIN AREA DEVELOPED ITCHING,BURNING SENSATION, Onset Date: [id number]     Histories 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/03/2011   GERD (gastroesophageal reflux disease)    H/O hiatal hernia    HLD (hyperlipidemia)    Hypertension 12/03/2011   Hyperthyroidism    IBS (irritable bowel syndrome)    Morbid obesity with BMI of 45.0-49.9, adult (HCC) 12/03/2011   Sleep apnea 12/03/2011   last sleep study May 2013   Past Surgical History:  Procedure Laterality Date    BIOPSY  07/29/2022   Procedure: BIOPSY;  Surgeon: Lemar Lofty., MD;  Location: [facility];  Service: Gastroenterology;;   CESAREAN SECTION     x 2   COLONOSCOPY WITH PROPOFOL N/A 07/29/2022   Procedure: COLONOSCOPY WITH PROPOFOL;  Surgeon: Lemar Lofty., MD;  Location: [facility];  Service: Gastroenterology;  Laterality: N/A;   ESOPHAGOGASTRODUODENOSCOPY (EGD) WITH PROPOFOL N/A 07/29/2022   Procedure: ESOPHAGOGASTRODUODENOSCOPY (EGD) WITH PROPOFOL;  Surgeon: Meridee Score Netty Starring., MD;  Location: [facility];  Service: Gastroenterology;  Laterality: N/A;   EUS N/A 07/29/2022   Procedure: UPPER ENDOSCOPIC ULTRASOUND (EUS) LINEAR;  Surgeon: Lemar Lofty., MD;  Location: [facility];  Service: Gastroenterology;  Laterality: N/A;   FINE NEEDLE ASPIRATION N/A 07/29/2022   Procedure: FINE NEEDLE ASPIRATION (FNA) LINEAR;  Surgeon: Lemar Lofty., MD;  Location: [facility];  Service: Gastroenterology;  Laterality: N/A;   LAPAROSCOPIC APPENDECTOMY  12/03/2011   Procedure: APPENDECTOMY LAPAROSCOPIC;  Surgeon: Lodema Pilot, DO;  Location: [facility]  [facility];  Service: General;  Laterality: N/A;  drainage of intra-abdominal abscess    PARS PLANA VITRECTOMY Left 08/17/2012   Procedure: PARS PLANA VITRECTOMY WITH 25 GAUGE;  Surgeon: Sherrie George, MD;  Location: [facility] OR;  Service: Ophthalmology;  Laterality: Left;   POLYPECTOMY  07/29/2022   Procedure: POLYPECTOMY;  Surgeon: Mansouraty, Netty Starring., MD;  Location: [facility];  Service: Gastroenterology;;   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   Procedure: REPAIR OF COMPLEX TRACTION RETINAL DETACHMENT;  Surgeon: Sherrie George, MD;  Location: [facility] OR;  Service: Ophthalmology;  Laterality: Left;   TUBAL LIGATION     Social History   Socioeconomic History   Marital status: Divorced    Spouse name: Not on file   Number of children: 2   Years of education: Not on file   Highest education level: Not  on file  Occupational History   Not on file  Tobacco Use   Smoking status: Some Days    Types: Cigars   Smokeless tobacco: Never  Vaping Use   Vaping status: Never Used  Substance and Sexual Activity   Alcohol use: Not Currently    Alcohol/week: 1.0 standard drink of alcohol    Types: 1 Glasses of wine per week    Comment: occasional   Drug use: No   Sexual activity: Yes    Birth control/protection: Pill  Other Topics Concern   Not on file  Social History Narrative   Lives alone.    Social Determinants of Health   Financial Resource Strain: Not on file  Food Insecurity: Not on file  Transportation Needs: Not on file  Physical Activity: Not on file  Stress: Not on file  Social Connections: Not on file  Intimate Partner Violence: Not on file   Family History  Problem Relation Age of Onset   Breast cancer Mother 21   Diabetes Mother    Hypertension Mother    Kidney failure Father    Heart disease Father        No details   Diabetes Sister    Hypertension Sister    Hyperlipidemia Sister    Diabetes Maternal Uncle    Cancer Maternal Uncle        type unknown   Diabetes Paternal Aunt    Hypertension Paternal Aunt    Breast cancer Paternal Aunt    Fibromyalgia Paternal Aunt    Breast cancer Maternal Grandmother    Hypertension Maternal Grandmother    Kidney disease Maternal Grandfather    Cancer Maternal Grandfather        type unknown   Diabetes Paternal Grandmother    Diabetes Paternal Grandfather    Diabetes Daughter    Colon cancer Neg Hx    Esophageal cancer Neg Hx    Inflammatory bowel disease Neg Hx    Liver disease Neg Hx    Pancreatic cancer Neg Hx    Rectal cancer Neg Hx    Stomach cancer Neg Hx    I have reviewed her medical, social, and family history in detail and updated the electronic medical record as necessary.    PHYSICAL EXAMINATION  BP 130/68   Pulse 76   Ht 5\' 10"  (1.778 m)   Wt (!) 317 lb (143.8 kg)   LMP 08/09/2012   BMI 45.48  kg/m  Wt Readings from Last 3 Encounters:  01/28/23 (!) 313 lb (142 kg)  01/26/23 (!) 317 lb (143.8 kg)  01/19/23 (!) 312 lb (141.5 kg)  GEN: NAD, appears stated age, doesn't appear chronically ill PSYCH: Cooperative, without pressured speech EYE: Conjunctivae pink, sclerae anicteric ENT: MMM CV: Nontachycardic RESP: No audible wheezing GI: NABS, soft, rounded, protuberant, obese, NT, no rebound MSK/EXT: Bilateral pedal edema present SKIN: No jaundice NEURO:  Alert & Oriented x 3, no focal deficits   REVIEW OF DATA  I reviewed the following data at the time of this encounter:  GI Procedures and Studies  No new imaging studies to review  Laboratory Studies  Reviewed those in epic  Imaging Studies  No new imaging studies to review   ASSESSMENT  Ms. Gilani is a 57 y.o. 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 pylori, colon polyps (TA's), chronic constipation.  The patient is seen today for evaluation and management of:  1. Chronic constipation   2. LUQ pain   3. Abdominal bloating   4. Gastroesophageal reflux disease without esophagitis    The patient is hemodynamically stable.  Clinically she did note an improvement after being on Linzess therapy.  Will get a try to see if Linzess 145 mcg may be the right dose for her versus the 72 mcg.  She will let us know within the next 10 days and then we can send in a prescription.  I think this will help her symptoms of abdominal discomfort and bloating even further.  Can consider SIBO breath testing in the future though treating her constipation seems to be the reason for issues.  In regards to follow-up of her subepithelial lesion we continue to plan a 1 year follow-up EUS from the time of her last EUS.  I defer any further management to her  primary care provider in regards to the mediastinal mass/goiter, she was previously referred to cardiothoracic surgery but did not follow-up with them.  When/if she is ready for follow-up with them, are primary care team can work as needed in the future.  All patient questions were answered to the best of my ability, and the patient agrees to the aforementioned plan of action with follow-up as indicated.   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   New Prescriptions   No medications on file   Modified Medications   No medications on file    Planned Follow Up Return in about 4 months (around 05/29/2023).   Total Time in Face-to-Face and in Coordination of Care for patient including independent/personal interpretation/review of prior testing, medical history, examination, medication adjustment, communicating results with the patient directly, and documentation within the EHR is 25 minutes.   Corliss Parish, MD [place]  [facility] Office # [phone]

## 2023-01-27 ENCOUNTER — Telehealth: Payer: Self-pay | Admitting: Nurse Practitioner

## 2023-01-27 ENCOUNTER — Encounter: Payer: Self-pay | Admitting: Hematology

## 2023-01-27 ENCOUNTER — Encounter: Payer: Self-pay | Admitting: Dietician

## 2023-01-27 ENCOUNTER — Other Ambulatory Visit: Payer: Self-pay

## 2023-01-27 ENCOUNTER — Encounter: Payer: Medicaid Other | Attending: Nurse Practitioner | Admitting: Dietician

## 2023-01-27 VITALS — Wt 313.0 lb

## 2023-01-27 DIAGNOSIS — E1142 Type 2 diabetes mellitus with diabetic polyneuropathy: Secondary | ICD-10-CM | POA: Diagnosis present

## 2023-01-27 NOTE — Telephone Encounter (Signed)
Pt acalled in about status of PA for Semaglutide 0.5 mg , alsos checing status of Dexco G7 sensor

## 2023-01-27 NOTE — Patient Instructions (Signed)
Find ways to be more active.  Walking  Kristina Adams video  Gym?  Great job on changes made!  Eat more Non-Starchy Vegetables These include greens, broccoli, cauliflower, cabbage, carrots, beets, eggplant, peppers, squash and others. Minimize added sugars and refined grains Rethink what you drink.  Choose beverages without added sugar.  Look for 0 carbs on the label. See the list of whole grains below.  Find alternatives to usual sweet treats. Choose whole foods over processed. Make simple meals at home more often than eating out.  Tips to increase fiber in your diet: (All plants have fiber.  Eat a variety. There are more than are on this list.) Slowly increase the amount of fiber you eat to 25-35 grams per day.  (More is fine if you tolerate it.) Fiber from whole grains, nuts and seeds Quinoa, 1/2 cup = 5 grams Bulgur, 1/2 cup = 4.1 grams Popcorn, 3 cups = 3.6 grams Whole Wheat Spaghetti, 1/2 cup = 3.2 grams Barley, 1/2 cup = 3 grams Oatmeal, 1/2 cup = 2 grams Whole Wheat English Muffin = 3 grams Corn, 1/2 cup = 2.1 grams Brown Rice, 1/2 cup = 1.8 grams Flax seeds, 1 Tablespoon = 2.8 grams Chia seeds, 1 Tablespoon = 11 grams Almonds, 1 ounce = 3.5 grams fiber Fiber from legumes Kidney beans, 1/2 cup 7.9 grams Lentils, 1/2 cup = 7.8 grams Pinto beans, 1/2 cup = 7.7 grams Black beans, 1/2 cup = 7.6 grams Lima beans, 1/2 cup 6.4 grams Chick peas, 1/2 cup = 5.3 grams Black eyed peas, 1/2 cup = 4 grams Fiber from fruits and vegetables Pear, 6 grams Apple. 3.3 grams Raspberries or Blackberries, 3/4 cup = 6 grams Strawberries or Blueberries, 1 cup = 3.4 grams Baked sweet potato 3.8 grams fiber Baked potato with skin 4.4 grams  Peas, 1/2 cup = 4.4 grams  Spinach, 1/2 cup cooked = 3.5 grams  Avocado, 1/2 = 5 grams

## 2023-01-27 NOTE — Progress Notes (Signed)
Diabetes Self-Management Education  Visit Type: Follow-up  Appt. Start Time: 1205 Appt. End Time: 1230  01/28/2023  [name], identified by name and date of birth, is a 57 y.o. female with a diagnosis of Diabetes:  .   ASSESSMENT  Patient is here today alone.  She was last seen by this RD on  Recent problems with constipation.  MD restarted Linzess. She states that she likes the [facility]. Isn't active.  Referral:  Type 2 Diabetes (1988), HLD, HTN, OSA - no C-pap, hypothyroid, GERD, IBS History includes:  Type 2 Diabetes, toe ulcer (currently wearing a boot) Medications include:  Glipizide, Semglee YFGN 45 units q am and 40 units q HS, Semaglutide (hasn't been able to get it for 2 weeks), 0.5, fenofibrate, rosuvastatin, lisinopril, MVI, vitamin D, Vitamin C, methimazole Sleep:  4 hours per night Labs:  A1C 10.1% 10/20/2022 increased from 8.7% 02/2020/2023   CGM:  Dexcom G7 but hasn't started this yet. CGM sample in the office.  Patient was able to start this via her phone app and applied successfully to her arm.  CGM Results from download:   % Time CGM active:    %   (Goal >70%)  Average glucose:   139 mg/dL for 14 days  Glucose management indicator:    %  Time in range (70-180 mg/dL):   76 %   (Goal >16%)  Time High (181-250 mg/dL):   18 %   (Goal < 10%)  Time Very High (>250 mg/dL):    2 %   (Goal < 5%)  Time Low (54-69 mg/dL):   4 %   (Goal <9%)  Time Very Low (<54 mg/dL):   0 %   (Goal <6%)  %CV (glucose variability)     %  (Goal <36%)    Weight hx 70" 313 lbs 01/27/2023 303 lbs (2 lbs removed for boot) 312 lb 10/22/2022   Lives alone.  No longer working.  Currently in the process of working on disability.  Emotional eater. She enjoys cooking and finds this therapeutic.  She is cooking for her son and family twice per week. Unable to walk much due to her boot for foot wound.  Weight (!) 313 lb (142 kg), last menstrual period 08/09/2012. Body mass index is 44.91  kg/m.   Diabetes Self-Management Education - 01/28/23 1557       Visit Information   Visit Type Follow-up      Initial Visit   Are you taking your medications as prescribed? Yes      Psychosocial Assessment   Self-care barriers None    Self-management support Doctor's office;CDE visits    Other persons present Patient    Preferred Learning Style No preference indicated    Learning Readiness Ready      Pre-Education Assessment   Patient understands the diabetes disease and treatment process. Needs Review    Patient understands incorporating nutritional management into lifestyle. Needs Review    Patient undertands incorporating physical activity into lifestyle. Needs Review    Patient understands using medications safely. Needs Review    Patient understands monitoring blood glucose, interpreting and using results Needs Review    Patient understands prevention, detection, and treatment of acute complications. Needs Review    Patient understands prevention, detection, and treatment of chronic complications. Needs Review    Patient understands how to develop strategies to address psychosocial issues. Needs Review      Complications   How often do you check your  blood sugar? > 4 times/day    Fasting Blood glucose range (mg/dL) 78-469    Postprandial Blood glucose range (mg/dL) 629-528    Number of hypoglycemic episodes per month 0      Dietary Intake   Breakfast nectarine    Lunch none    Dinner pasta salad, [place] meatballs (homemade), 2 deviled eggs    Beverage(s) water, coffee with splenda and powdered creamer, flavor packets, diet juice, occasional diet gingerale      Activity / Exercise   Activity / Exercise Type Light (walking / raking leaves)      Patient Education   Previous Diabetes Education Yes (please comment)    Healthy Eating Meal options for control of blood glucose level and chronic complications.    Being Active Role of exercise on diabetes management, blood  pressure control and cardiac health.;Helped patient identify appropriate exercises in relation to his/her diabetes, diabetes complications and other health issue.    Medications Reviewed patients medication for diabetes, action, purpose, timing of dose and side effects.    Monitoring Taught/evaluated CGM (comment)    Diabetes Stress and Support Identified and addressed patients feelings and concerns about diabetes;Worked with patient to identify barriers to care and solutions      Individualized Goals (developed by patient)   Nutrition General guidelines for healthy choices and portions discussed    Physical Activity Exercise 3-5 times per week;15 minutes per day    Medications take my medication as prescribed    Monitoring  Consistenly use CGM    Problem Solving Eating Pattern    Reducing Risk examine blood glucose patterns;do foot checks daily;treat hypoglycemia with 15 grams of carbs if blood glucose less than 70mg /dL      Patient Self-Evaluation of Goals - Patient rates self as meeting previously set goals (% of time)   Nutrition 50 - 75 % (half of the time)    Physical Activity < 25% (hardly ever/never)    Medications >75% (most of the time)    Monitoring >75% (most of the time)    Problem Solving and behavior change strategies  >75% (most of the time)    Reducing Risk (treating acute and chronic complications) 50 - 75 % (half of the time)    Health Coping 50 - 75 % (half of the time)      Post-Education Assessment   Patient understands the diabetes disease and treatment process. Demonstrates understanding / competency    Patient understands incorporating nutritional management into lifestyle. Comprehends key points    Patient undertands incorporating physical activity into lifestyle. Demonstrates understanding / competency    Patient understands using medications safely. Demonstrates understanding / competency    Patient understands monitoring blood glucose, interpreting and using  results Comprehends key points    Patient understands prevention, detection, and treatment of acute complications. Demonstrates understanding / competency    Patient understands prevention, detection, and treatment of chronic complications. Demonstrates understanding / competency    Patient understands how to develop strategies to address psychosocial issues. Demonstrates understanding / competency    Patient understands how to develop strategies to promote health/change behavior. Comprehends key points      Outcomes   Expected Outcomes Demonstrated interest in learning. Expect positive outcomes    Future DMSE PRN    Program Status Completed      Subsequent Visit   Since your last visit have you experienced any weight changes? Gain    Weight Gain (lbs) 10   constipation?  Since your last visit, are you checking your blood glucose at least once a day? Yes             Individualized Plan for Diabetes Self-Management Training:   Learning Objective:  Patient will have a greater understanding of diabetes self-management. Patient education plan is to attend individual and/or group sessions per assessed needs and concerns.   Plan:   Patient Instructions  Find ways to be more active.  Walking  [employer] video  Gym?  Great job on changes made!  Eat more Non-Starchy Vegetables These include greens, broccoli, cauliflower, cabbage, carrots, beets, eggplant, peppers, squash and others. Minimize added sugars and refined grains Rethink what you drink.  Choose beverages without added sugar.  Look for 0 carbs on the label. See the list of whole grains below.  Find alternatives to usual sweet treats. Choose whole foods over processed. Make simple meals at home more often than eating out.  Tips to increase fiber in your diet: (All plants have fiber.  Eat a variety. There are more than are on this list.) Slowly increase the amount of fiber you eat to 25-35 grams per day.  (More is fine  if you tolerate it.) Fiber from whole grains, nuts and seeds Quinoa, 1/2 cup = 5 grams Bulgur, 1/2 cup = 4.1 grams Popcorn, 3 cups = 3.6 grams Whole Wheat Spaghetti, 1/2 cup = 3.2 grams Barley, 1/2 cup = 3 grams Oatmeal, 1/2 cup = 2 grams Whole Wheat English Muffin = 3 grams Corn, 1/2 cup = 2.1 grams Brown Rice, 1/2 cup = 1.8 grams Flax seeds, 1 Tablespoon = 2.8 grams Chia seeds, 1 Tablespoon = 11 grams Almonds, 1 ounce = 3.5 grams fiber Fiber from legumes Kidney beans, 1/2 cup 7.9 grams Lentils, 1/2 cup = 7.8 grams Pinto beans, 1/2 cup = 7.7 grams Black beans, 1/2 cup = 7.6 grams Lima beans, 1/2 cup 6.4 grams Chick peas, 1/2 cup = 5.3 grams Black eyed peas, 1/2 cup = 4 grams Fiber from fruits and vegetables Pear, 6 grams Apple. 3.3 grams Raspberries or Blackberries, 3/4 cup = 6 grams Strawberries or Blueberries, 1 cup = 3.4 grams Baked sweet potato 3.8 grams fiber Baked potato with skin 4.4 grams  Peas, 1/2 cup = 4.4 grams  Spinach, 1/2 cup cooked = 3.5 grams  Avocado, 1/2 = 5 grams      Expected Outcomes:  Demonstrated interest in learning. Expect positive outcomes  Education material provided:   If problems or questions, patient to contact team via:  Phone  Future DSME appointment: PRN

## 2023-01-28 ENCOUNTER — Other Ambulatory Visit (HOSPITAL_COMMUNITY): Payer: Self-pay

## 2023-01-28 ENCOUNTER — Telehealth: Payer: Self-pay | Admitting: Nurse Practitioner

## 2023-01-28 ENCOUNTER — Encounter: Payer: Self-pay | Admitting: Hematology

## 2023-01-28 NOTE — Telephone Encounter (Signed)
Spoke with patient . Verified name & DOB    Advised patient  She will need to contact medicaid to get previous insurance removed. Patient has medicaid, however medicaid is still showing active commercial coverage.

## 2023-01-28 NOTE — Telephone Encounter (Signed)
Copied from CRM [id number]. Topic: General - Other >> Jan 28, 2023  3:34 PM Phill Myron wrote: Attn: Cassandra per our recent conversation  I did reach medicaid ... And what I found out was that the pharmacy was filing wrong, pharmacy should be filing under [employer]... and not under Medicaid.   Please inform the pharmacy thank you

## 2023-01-29 ENCOUNTER — Encounter: Payer: Self-pay | Admitting: Gastroenterology

## 2023-01-29 DIAGNOSIS — R1012 Left upper quadrant pain: Secondary | ICD-10-CM | POA: Insufficient documentation

## 2023-01-29 DIAGNOSIS — K5909 Other constipation: Secondary | ICD-10-CM | POA: Insufficient documentation

## 2023-01-29 DIAGNOSIS — R140 Abdominal distension (gaseous): Secondary | ICD-10-CM | POA: Insufficient documentation

## 2023-01-31 ENCOUNTER — Other Ambulatory Visit: Payer: Self-pay

## 2023-02-01 ENCOUNTER — Other Ambulatory Visit: Payer: Self-pay

## 2023-02-02 ENCOUNTER — Other Ambulatory Visit: Payer: Self-pay

## 2023-02-02 ENCOUNTER — Telehealth: Payer: Self-pay

## 2023-02-02 NOTE — Telephone Encounter (Signed)
A prior authorization request for Ozempic has been submitted to insurance today via CoverMyMeds Key: [id number]

## 2023-02-03 ENCOUNTER — Encounter (INDEPENDENT_AMBULATORY_CARE_PROVIDER_SITE_OTHER): Payer: Self-pay

## 2023-02-03 ENCOUNTER — Other Ambulatory Visit: Payer: Self-pay

## 2023-02-04 ENCOUNTER — Other Ambulatory Visit: Payer: Self-pay

## 2023-02-06 ENCOUNTER — Other Ambulatory Visit: Payer: Self-pay | Admitting: Family Medicine

## 2023-02-06 DIAGNOSIS — E0590 Thyrotoxicosis, unspecified without thyrotoxic crisis or storm: Secondary | ICD-10-CM

## 2023-02-07 ENCOUNTER — Other Ambulatory Visit: Payer: Self-pay

## 2023-02-07 NOTE — Telephone Encounter (Signed)
Requested medication (s) are due for refill today - yes  Requested medication (s) are on the active medication list -yes  Future visit scheduled -yes  Last refill: 11/11/22 #180  Notes to clinic: non delegated Rx  Requested Prescriptions  Pending Prescriptions Disp Refills   methimazole (TAPAZOLE) 5 MG tablet 180 tablet 0    Sig: Take 1 tablet (5 mg total) by mouth 2 (two) times daily.     Not Delegated - Endocrinology:  Hyperthyroid Agents Failed - 02/06/2023  4:31 PM      Failed - This refill cannot be delegated      Failed - TSH in normal range and within 180 days    TSH  Date Value Ref Range Status  11/27/2021 0.946 0.450 - 4.500 uIU/mL Final         Failed - T3 Total in normal range and within 180 days    No results found for: "T3TOTAL", "T3FREE"       Failed - T4 free in normal range and within 180 days    T4, Total  Date Value Ref Range Status  11/27/2021 7.3 4.5 - 12.0 ug/dL Final         Passed - Valid encounter within last 6 months    Recent Outpatient Visits           3 months ago Type 2 diabetes mellitus with hyperglycemia, with long-term current use of insulin (HCC)   [place] [facility] [place], [place] W, NP   8 months ago Type 2 diabetes mellitus with hyperglycemia, with long-term current use of insulin [facility])   [place] [facility] [place], [place] W, NP   11 months ago Type 2 diabetes mellitus with hyperglycemia, with long-term current use of insulin [facility])   [place] [facility] [place], Shea Stakes, NP   1 year ago Acute cystitis without hematuria   [place] [facility] Claiborne Rigg, NP   1 year ago Essential hypertension    [place] [facility] Claiborne Rigg, NP       Future Appointments             In 3 weeks Claiborne Rigg, NP [facility]   In 2 months Shamleffer, Konrad Dolores,  MD [facility] Endocrinology               Requested Prescriptions  Pending Prescriptions Disp Refills   methimazole (TAPAZOLE) 5 MG tablet 180 tablet 0    Sig: Take 1 tablet (5 mg total) by mouth 2 (two) times daily.     Not Delegated - Endocrinology:  Hyperthyroid Agents Failed - 02/06/2023  4:31 PM      Failed - This refill cannot be delegated      Failed - TSH in normal range and within 180 days    TSH  Date Value Ref Range Status  11/27/2021 0.946 0.450 - 4.500 uIU/mL Final         Failed - T3 Total in normal range and within 180 days    No results found for: "T3TOTAL", "T3FREE"       Failed - T4 free in normal range and within 180 days    T4, Total  Date Value Ref Range Status  11/27/2021 7.3 4.5 - 12.0 ug/dL Final         Passed - Valid encounter within last 6 months  Recent Outpatient Visits           3 months ago Type 2 diabetes mellitus with hyperglycemia, with long-term current use of insulin [facility])   [place] [facility] [place], [place] W, NP   8 months ago Type 2 diabetes mellitus with hyperglycemia, with long-term current use of insulin [facility])   [place] [facility] [place], [place], NP   11 months ago Type 2 diabetes mellitus with hyperglycemia, with long-term current use of insulin [facility])   [place] [facility] [place], Shea Stakes, NP   1 year ago Acute cystitis without hematuria   [place] [facility] [place], Shea Stakes, NP   1 year ago Essential hypertension    [facility] Claiborne Rigg, NP       Future Appointments             In 3 weeks Claiborne Rigg, NP [facility]   In 2 months Shamleffer, Konrad Dolores, MD [facility] Endocrinology

## 2023-02-08 ENCOUNTER — Other Ambulatory Visit: Payer: Self-pay

## 2023-02-08 ENCOUNTER — Telehealth: Payer: Self-pay | Admitting: Nurse Practitioner

## 2023-02-08 MED ORDER — METHIMAZOLE 5 MG PO TABS
5.0000 mg | ORAL_TABLET | Freq: Two times a day (BID) | ORAL | 0 refills | Status: DC
Start: 2023-02-08 — End: 2023-03-01
  Filled 2023-02-08: qty 180, 90d supply, fill #0

## 2023-02-08 NOTE — Telephone Encounter (Signed)
Patient called stated she needs the form that was faxed from her insurance company [employer] stating that she is capable of using the Dexcom CGM device. Please f/u with patient if more info is needed

## 2023-02-09 ENCOUNTER — Other Ambulatory Visit: Payer: Self-pay

## 2023-02-11 ENCOUNTER — Other Ambulatory Visit: Payer: Self-pay

## 2023-02-11 ENCOUNTER — Telehealth: Payer: Self-pay | Admitting: Gastroenterology

## 2023-02-11 MED ORDER — LINACLOTIDE 145 MCG PO CAPS
145.0000 ug | ORAL_CAPSULE | Freq: Every day | ORAL | 1 refills | Status: DC
  Filled 2023-02-11: qty 90, 90d supply, fill #0
  Filled 2023-05-10: qty 90, 90d supply, fill #1

## 2023-02-11 NOTE — Telephone Encounter (Signed)
90 day supply of 145 mg Linzess has been sent to [facility]. We currently do not have any samples of 145 mcg. Called and left message for patient that rx has been sent to [facility].

## 2023-02-11 NOTE — Telephone Encounter (Signed)
Inbound call from patient stating that she was given samples of Linzess and is out. Patient Is wanted a prescription. 90 days, 145 mg and is requesting it be sent to [facility]. Please advise.

## 2023-02-14 ENCOUNTER — Other Ambulatory Visit: Payer: Self-pay

## 2023-03-01 ENCOUNTER — Other Ambulatory Visit: Payer: Self-pay

## 2023-03-01 ENCOUNTER — Ambulatory Visit: Payer: Medicaid Other | Attending: Nurse Practitioner | Admitting: Nurse Practitioner

## 2023-03-01 ENCOUNTER — Encounter: Payer: Self-pay | Admitting: Nurse Practitioner

## 2023-03-01 VITALS — BP 145/76 | HR 77 | Ht 70.0 in | Wt 306.6 lb

## 2023-03-01 DIAGNOSIS — I10 Essential (primary) hypertension: Secondary | ICD-10-CM

## 2023-03-01 DIAGNOSIS — K219 Gastro-esophageal reflux disease without esophagitis: Secondary | ICD-10-CM | POA: Diagnosis not present

## 2023-03-01 DIAGNOSIS — Z0000 Encounter for general adult medical examination without abnormal findings: Secondary | ICD-10-CM | POA: Diagnosis not present

## 2023-03-01 DIAGNOSIS — E1165 Type 2 diabetes mellitus with hyperglycemia: Secondary | ICD-10-CM | POA: Diagnosis not present

## 2023-03-01 DIAGNOSIS — Z794 Long term (current) use of insulin: Secondary | ICD-10-CM

## 2023-03-01 DIAGNOSIS — R7989 Other specified abnormal findings of blood chemistry: Secondary | ICD-10-CM

## 2023-03-01 DIAGNOSIS — E0590 Thyrotoxicosis, unspecified without thyrotoxic crisis or storm: Secondary | ICD-10-CM

## 2023-03-01 DIAGNOSIS — E785 Hyperlipidemia, unspecified: Secondary | ICD-10-CM

## 2023-03-01 MED ORDER — HYDROCHLOROTHIAZIDE 25 MG PO TABS
25.0000 mg | ORAL_TABLET | Freq: Every day | ORAL | 1 refills | Status: DC
  Filled 2023-03-01 – 2023-04-21 (×2): qty 90, 90d supply, fill #0
  Filled 2023-07-28: qty 90, 90d supply, fill #1

## 2023-03-01 MED ORDER — METHIMAZOLE 5 MG PO TABS
5.0000 mg | ORAL_TABLET | Freq: Two times a day (BID) | ORAL | 0 refills | Status: DC
  Filled 2023-03-01: qty 180, 90d supply, fill #0

## 2023-03-01 MED ORDER — FENOFIBRATE 145 MG PO TABS
145.0000 mg | ORAL_TABLET | Freq: Every day | ORAL | 1 refills | Status: DC
  Filled 2023-03-01: qty 90, 90d supply, fill #0
  Filled 2023-05-30: qty 90, 90d supply, fill #1

## 2023-03-01 MED ORDER — PANTOPRAZOLE SODIUM 40 MG PO TBEC
40.0000 mg | DELAYED_RELEASE_TABLET | Freq: Two times a day (BID) | ORAL | 0 refills | Status: DC
  Filled 2023-03-01: qty 180, 90d supply, fill #0

## 2023-03-01 MED ORDER — GLIPIZIDE 10 MG PO TABS
10.0000 mg | ORAL_TABLET | Freq: Two times a day (BID) | ORAL | 1 refills | Status: DC
  Filled 2023-03-01: qty 180, 90d supply, fill #0

## 2023-03-01 MED ORDER — LISINOPRIL 40 MG PO TABS
40.0000 mg | ORAL_TABLET | Freq: Every day | ORAL | 1 refills | Status: DC
  Filled 2023-03-01 – 2023-04-21 (×2): qty 90, 90d supply, fill #0
  Filled 2023-07-28: qty 90, 90d supply, fill #1

## 2023-03-01 NOTE — Progress Notes (Signed)
Assessment & Plan:  Kristina Adams was seen today for annual exam.  Diagnoses and all orders for this visit:  Encounter for annual physical exam -     CMP14+EGFR -     CBC with Differential -     Lipid panel -     Thyroid Panel With TSH -     Hemoglobin A1c  Type 2 diabetes mellitus with hyperglycemia, with long-term current use of insulin (HCC) -     glipiZIDE (GLUCOTROL) 10 MG tablet; Take 1 tablet (10 mg total) by mouth 2 (two) times daily before a meal. -     CMP14+EGFR -     Hemoglobin A1c Lab Results  Component Value Date   HGBA1C 10.1 (A) 10/20/2022     Essential hypertension Blood pressure is well controlled -     hydrochlorothiazide (HYDRODIURIL) 25 MG tablet; TAKE 1 TABLET (25 MG TOTAL) BY MOUTH DAILY. -     lisinopril (ZESTRIL) 40 MG tablet; Take 1 tablet (40 mg total) by mouth daily. -     CMP14+EGFR  Gastroesophageal reflux disease, unspecified whether esophagitis present -     pantoprazole (PROTONIX) 40 MG tablet; Take 1 tablet (40 mg total) by mouth 2 (two) times daily. INSTRUCTIONS: Avoid GERD Triggers: acidic, spicy or fried foods, caffeine, coffee, sodas,  alcohol and chocolate.    Thyrotoxicosis without thyroid storm, unspecified thyrotoxicosis type Awaiting appt with endocrinology -     methimazole (TAPAZOLE) 5 MG tablet; Take 1 tablet (5 mg total) by mouth 2 (two) times daily. -     CMP14+EGFR -     Thyroid Panel With TSH  Dyslipidemia, goal LDL below 70 -     fenofibrate (TRICOR) 145 MG tablet; Take 1 tablet (145 mg total) by mouth daily. -     Lipid panel INSTRUCTIONS: Work on a low fat, heart healthy diet and participate in regular aerobic exercise program by working out at least 150 minutes per week; 5 days a week-30 minutes per day. Avoid red meat/beef/steak,  fried foods. junk foods, sodas, sugary drinks, unhealthy snacking, alcohol and smoking.  Drink at least 80 oz of water per day and monitor your carbohydrate intake daily.    Abnormal CBC -      CBC with Differential    Patient has been counseled on age-appropriate routine health concerns for screening and prevention. These are reviewed and up-to-date. Referrals have been placed accordingly. Immunizations are up-to-date or declined.    Subjective:   Chief Complaint  Patient presents with   Annual Exam   HPI Kristina Adams 57 y.o. female presents to office today for annual physical exam.   Still with some digestive issues and concerned about stools and constipation. Had an episode last week of increased epigastric discomfort, burping and heartburn. Prior to these symptoms her ozempic had been increased to 1mg  weekly. She stopped linzess last Thursday thinking this was causing her symptoms. States today is the first time the symptoms have lessened. I do not believe her symptoms are from the linzess and have instructed her to resume as she has been on linzess for over a month now. It is likely her symptoms of GI discomfort are from the recent increase in ozempic. She has been instructed to contact the office if symptoms resume after next dose of ozempic in 2 days. .    She has a past medical history of Anemia (12/09/2011), Anxiety, Appendicitis, acute (12/03/2011), Arthritis, Chronic back pain (12/03/2011), Degenerative disc disease, cervical, Diabetes mellitus  type 2, insulin dependent (12/03/2011), GERD, H/O hiatal hernia, HLD, Hypertension (12/03/2011), Hyperthyroidism, IBS,  Morbid obesity with BMI of 45.0-49.9, adult (HCC) (12/03/2011), and Sleep apnea (12/03/2011).     Blood pressure is elevated today.  She endorses adherence taking hydrochlorothiazide and lisinopril.  We may need to add amlodipine 5 to 10 mg if blood pressure continues elevated. BP Readings from Last 3 Encounters:  03/01/23 (!) 145/76  01/26/23 130/68  01/19/23 (!) 145/72     Review of Systems  Constitutional:  Negative for fever, malaise/fatigue and weight loss.  HENT: Negative.  Negative for nosebleeds.    Eyes: Negative.  Negative for blurred vision, double vision and photophobia.  Respiratory: Negative.  Negative for cough and shortness of breath.   Cardiovascular: Negative.  Negative for chest pain, palpitations and leg swelling.  Gastrointestinal:  Positive for heartburn. Negative for nausea and vomiting.  Genitourinary: Negative.   Musculoskeletal: Negative.  Negative for myalgias.  Skin: Negative.   Neurological: Negative.  Negative for dizziness, focal weakness, seizures and headaches.  Endo/Heme/Allergies: Negative.   Psychiatric/Behavioral: Negative.  Negative for suicidal ideas.     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/03/2011   GERD (gastroesophageal reflux disease)    H/O hiatal hernia    HLD (hyperlipidemia)    Hypertension 12/03/2011   Hyperthyroidism    IBS (irritable bowel syndrome)    Morbid obesity with BMI of 45.0-49.9, adult (HCC) 12/03/2011   Sleep apnea 12/03/2011   last sleep study May 2013    Past Surgical History:  Procedure Laterality Date   BIOPSY  07/29/2022   Procedure: BIOPSY;  Surgeon: Lemar Lofty., MD;  Location: [facility];  Service: Gastroenterology;;   CESAREAN SECTION     x 2   COLONOSCOPY WITH PROPOFOL N/A 07/29/2022   Procedure: COLONOSCOPY WITH PROPOFOL;  Surgeon: Lemar Lofty., MD;  Location: [facility];  Service: Gastroenterology;  Laterality: N/A;   ESOPHAGOGASTRODUODENOSCOPY (EGD) WITH PROPOFOL N/A 07/29/2022   Procedure: ESOPHAGOGASTRODUODENOSCOPY (EGD) WITH PROPOFOL;  Surgeon: Meridee Score Netty Starring., MD;  Location: [facility];  Service: Gastroenterology;  Laterality: N/A;   EUS N/A 07/29/2022   Procedure: UPPER ENDOSCOPIC ULTRASOUND (EUS) LINEAR;  Surgeon: Lemar Lofty., MD;  Location: [facility];  Service: Gastroenterology;  Laterality: N/A;   FINE NEEDLE ASPIRATION N/A 07/29/2022   Procedure: FINE NEEDLE ASPIRATION (FNA) LINEAR;  Surgeon: Lemar Lofty., MD;  Location: [facility];  Service: Gastroenterology;  Laterality: N/A;   LAPAROSCOPIC APPENDECTOMY  12/03/2011   Procedure: APPENDECTOMY LAPAROSCOPIC;  Surgeon: Lodema Pilot, DO;  Location: [facility];  Service: General;  Laterality: N/A;  drainage of intra-abdominal abscess    PARS PLANA VITRECTOMY Left 08/17/2012   Procedure: PARS PLANA VITRECTOMY WITH 25 GAUGE;  Surgeon: Sherrie George, MD;  Location: [facility] OR;  Service: Ophthalmology;  Laterality: Left;   POLYPECTOMY  07/29/2022   Procedure: POLYPECTOMY;  Surgeon: Mansouraty, Netty Starring., MD;  Location: [facility];  Service: Gastroenterology;;   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   Procedure: REPAIR OF COMPLEX TRACTION RETINAL DETACHMENT;  Surgeon: Sherrie George, MD;  Location: [facility] OR;  Service: Ophthalmology;  Laterality: Left;   TUBAL LIGATION      Family History  Problem Relation Age of Onset   Breast cancer Mother 77   Diabetes Mother    Hypertension Mother    Kidney failure Father    Heart disease Father        No details   Diabetes Sister    Hypertension Sister    Hyperlipidemia Sister    Diabetes Maternal Uncle    Cancer Maternal Uncle        type unknown   Diabetes Paternal Aunt    Hypertension Paternal Aunt    Breast cancer Paternal Aunt    Fibromyalgia Paternal Aunt    Breast cancer Maternal Grandmother    Hypertension Maternal Grandmother    Kidney disease Maternal Grandfather    Cancer Maternal Grandfather        type unknown   Diabetes Paternal Grandmother    Diabetes Paternal Grandfather    Diabetes Daughter    Colon cancer Neg Hx    Esophageal cancer Neg Hx    Inflammatory bowel disease Neg Hx    Liver disease Neg Hx    Pancreatic cancer Neg Hx    Rectal cancer Neg Hx     Stomach cancer Neg Hx     Social History Reviewed with no changes to be made today.   Outpatient Medications Prior to Visit  Medication Sig Dispense Refill   aspirin EC 81 MG tablet Take by mouth.     blood glucose meter kit and supplies KIT Use up to 2 times daily as directed. 1 each 0   Blood Pressure Monitor DEVI Please provide patient with insurance approved blood pressure monitor 1 each 0   Cholecalciferol (VITAMIN D-3) 125 MCG (5000 UT) TABS Take 1 tablet by mouth daily. 90 tablet 3   Continuous Blood Gluc Receiver (DEXCOM G7 RECEIVER) DEVI Use to check blood sugar continuously throughout the day. E11.9 1 each 0   Continuous Glucose Sensor (DEXCOM G7 SENSOR) MISC Use to check blood sugar continuously throughout the day. E11.9 3 each 6   cyclobenzaprine (FLEXERIL) 10 MG tablet TAKE 1 TABLET (10 MG TOTAL) BY MOUTH 3 (THREE) TIMES DAILY AS NEEDED FOR MUSCLE SPASMS. 90 tablet 2   diclofenac Sodium (VOLTAREN) 1 % GEL APPLY 4 G TOPICALLY 4 (FOUR) TIMES DAILY. (Patient taking differently: Apply 4 g topically daily as needed (Muscle spasm).) 300 g 2   gabapentin (NEURONTIN) 300 MG capsule Take 1 capsule (300 mg total) by mouth at bedtime. 270 capsule 2   glucose blood (TRUE METRIX BLOOD GLUCOSE TEST) test strip Testing twice daily. 200 each 1   insulin glargine (LANTUS SOLOSTAR) 100 UNIT/ML Solostar Pen Inject 45 Units into the skin in the morning AND 40 Units every evening. 75 mL 1   Insulin Pen Needle (B-D UF III MINI PEN NEEDLES) 31G X 5 MM MISC Use as instructed. Inject into the skin twice daily. NEEDS PASS 100 each 6   Lancets (ONETOUCH DELICA PLUS LANCET33G) MISC Testing twice daily 200 each 1   linaclotide (LINZESS) 145 MCG CAPS capsule Take 1 capsule (145 mcg total) by mouth daily before breakfast. 90 capsule 1   Multiple Vitamin (MULTI-VITAMIN) tablet Take 1 tablet by mouth daily.     mupirocin ointment (BACTROBAN) 2 % Apply 1 Application topically daily. 22 g 2   rosuvastatin (CRESTOR)  5 MG tablet Take 1 tablet (5 mg total) by mouth daily. 90 tablet 3   Semaglutide, 1 MG/DOSE, 4 MG/3ML SOPN Inject 1 mg as directed once a week. Start once you complete 4  doses of the 0.5mg  pen. 3 mL 1   Semaglutide,0.25 or 0.5MG /DOS, (OZEMPIC, 0.25 OR 0.5 MG/DOSE,) 2 MG/3ML SOPN Inject 0.5 mg into the skin once a week. For 4 weeks. 3 mL 0   vitamin C (ASCORBIC ACID) 500 MG tablet Take 1,000 mg by mouth daily.     fenofibrate 160 MG tablet TAKE 1 TABLET (160 MG TOTAL) BY MOUTH DAILY. 90 tablet 3   glipiZIDE (GLUCOTROL) 10 MG tablet Take 1 tablet (10 mg total) by mouth 2 (two) times daily before a meal. 180 tablet 1   hydrochlorothiazide (HYDRODIURIL) 25 MG tablet TAKE 1 TABLET (25 MG TOTAL) BY MOUTH DAILY. 90 tablet 1   lisinopril (ZESTRIL) 40 MG tablet Take 1 tablet (40 mg total) by mouth daily. 90 tablet 1   methimazole (TAPAZOLE) 5 MG tablet Take 1 tablet (5 mg total) by mouth 2 (two) times daily. 180 tablet 0   pantoprazole (PROTONIX) 40 MG tablet Take 1 tablet (40 mg total) by mouth 2 (two) times daily. 180 tablet 0   No facility-administered medications prior to visit.    Allergies  Allergen Reactions   Latex Other (See Comments)    Infection in left middle finger from latex gloves due to moisture.   Augmentin [Amoxicillin-Pot Clavulanate] Itching    Burning and itching, back pains, elevated sugar levels   Iohexol      Code: RASH, Desc: PATIENT CALLED 06/17/08-STATED AFTER 6 HOURS POST IV CONTRAST, HANDS,SCALP AND GROIN AREA DEVELOPED ITCHING,BURNING SENSATION, Onset Date: [id number]        Objective:    BP (!) 145/76 (BP Location: Left Arm, Patient Position: Sitting, Cuff Size: Large)   Pulse 77   Ht 5\' 10"  (1.778 m)   Wt (!) 306 lb 9.6 oz (139.1 kg)   LMP 08/09/2012   SpO2 99%   BMI 43.99 kg/m  Wt Readings from Last 3 Encounters:  03/01/23 (!) 306 lb 9.6 oz (139.1 kg)  01/28/23 (!) 313 lb (142 kg)  01/26/23 (!) 317 lb (143.8 kg)    Physical Exam Constitutional:       Appearance: She is well-developed.  HENT:     Head: Normocephalic and atraumatic.     Right Ear: Hearing, tympanic membrane, ear canal and external ear normal.     Left Ear: Hearing, tympanic membrane, ear canal and external ear normal.     Nose: Nose normal.     Right Turbinates: Not enlarged.     Left Turbinates: Not enlarged.     Mouth/Throat:     Lips: Pink.     Mouth: Mucous membranes are moist.     Dentition: No dental tenderness, gingival swelling, dental abscesses or gum lesions.     Pharynx: No oropharyngeal exudate.  Eyes:     General: No scleral icterus.       Right eye: No discharge.     Extraocular Movements: Extraocular movements intact.     Conjunctiva/sclera: Conjunctivae normal.     Pupils: Pupils are equal, round, and reactive to light.  Neck:     Thyroid: No thyromegaly.     Trachea: No tracheal deviation.  Cardiovascular:     Rate and Rhythm: Normal rate and regular rhythm.     Heart sounds: Normal heart sounds. No murmur heard.    No friction rub.  Pulmonary:     Effort: Pulmonary effort is normal. No accessory muscle usage or respiratory distress.     Breath sounds: Normal breath sounds. No decreased breath sounds, wheezing,  rhonchi or rales.  Abdominal:     General: Bowel sounds are normal. There is no distension.     Palpations: Abdomen is soft. There is no mass.     Tenderness: There is no abdominal tenderness. There is no right CVA tenderness, left CVA tenderness, guarding or rebound.     Hernia: No hernia is present.  Musculoskeletal:        General: No tenderness or deformity. Normal range of motion.     Cervical back: Normal range of motion and neck supple.  Lymphadenopathy:     Cervical: No cervical adenopathy.  Skin:    General: Skin is warm and dry.     Findings: No erythema.  Neurological:     Mental Status: She is alert and oriented to person, place, and time.     Cranial Nerves: No cranial nerve deficit.     Motor: Motor function is  intact.     Coordination: Coordination is intact. Coordination normal.     Gait: Gait is intact.     Deep Tendon Reflexes:     Reflex Scores:      Patellar reflexes are 1+ on the right side and 1+ on the left side. Psychiatric:        Attention and Perception: Attention normal.        Mood and Affect: Mood normal.        Speech: Speech normal.        Behavior: Behavior normal.        Thought Content: Thought content normal.        Judgment: Judgment normal.          Patient has been counseled extensively about nutrition and exercise as well as the importance of adherence with medications and regular follow-up. The patient was given clear instructions to go to ER or return to medical center if symptoms don't improve, worsen or new problems develop. The patient verbalized understanding.   Follow-up: Return for 6 WEEKS REPEAT LIPIDS/LABS. SEE ME IN 3 MONTHS.   Claiborne Rigg, FNP-BC [facility] [place], [place] [phone]   03/01/2023, 12:07 PM

## 2023-03-02 LAB — CBC WITH DIFFERENTIAL/PLATELET
Basophils Absolute: 0.1 10*3/uL (ref 0.0–0.2)
Basos: 1 %
EOS (ABSOLUTE): 0.1 10*3/uL (ref 0.0–0.4)
Eos: 2 %
Hematocrit: 34.6 % (ref 34.0–46.6)
Hemoglobin: 11.2 g/dL (ref 11.1–15.9)
Immature Grans (Abs): 0 10*3/uL (ref 0.0–0.1)
Immature Granulocytes: 0 %
Lymphocytes Absolute: 1.4 10*3/uL (ref 0.7–3.1)
Lymphs: 22 %
MCH: 27.1 pg (ref 26.6–33.0)
MCHC: 32.4 g/dL (ref 31.5–35.7)
MCV: 84 fL (ref 79–97)
Monocytes Absolute: 0.4 10*3/uL (ref 0.1–0.9)
Monocytes: 5 %
Neutrophils Absolute: 4.5 10*3/uL (ref 1.4–7.0)
Neutrophils: 70 %
Platelets: 358 10*3/uL (ref 150–450)
RBC: 4.13 x10E6/uL (ref 3.77–5.28)
RDW: 13.5 % (ref 11.7–15.4)
WBC: 6.4 10*3/uL (ref 3.4–10.8)

## 2023-03-02 LAB — CMP14+EGFR
ALT: 10 IU/L (ref 0–32)
AST: 15 IU/L (ref 0–40)
Albumin: 4.1 g/dL (ref 3.8–4.9)
Alkaline Phosphatase: 66 IU/L (ref 44–121)
BUN/Creatinine Ratio: 22 (ref 9–23)
BUN: 25 mg/dL — ABNORMAL HIGH (ref 6–24)
Bilirubin Total: <0.2 mg/dL (ref 0.0–1.2)
CO2: 23 mmol/L (ref 20–29)
Calcium: 9.4 mg/dL (ref 8.7–10.2)
Chloride: 98 mmol/L (ref 96–106)
Creatinine, Ser: 1.15 mg/dL — ABNORMAL HIGH (ref 0.57–1.00)
Globulin, Total: 2.7 g/dL (ref 1.5–4.5)
Glucose: 213 mg/dL — ABNORMAL HIGH (ref 70–99)
Potassium: 4.7 mmol/L (ref 3.5–5.2)
Sodium: 137 mmol/L (ref 134–144)
Total Protein: 6.8 g/dL (ref 6.0–8.5)
eGFR: 56 mL/min/{1.73_m2} — ABNORMAL LOW (ref >59)

## 2023-03-02 LAB — THYROID PANEL WITH TSH
Free Thyroxine Index: 1.8 (ref 1.2–4.9)
T3 Uptake Ratio: 25 % (ref 24–39)
T4, Total: 7.1 ug/dL (ref 4.5–12.0)
TSH: 1.56 u[IU]/mL (ref 0.450–4.500)

## 2023-03-02 LAB — HEMOGLOBIN A1C
Est. average glucose Bld gHb Est-mCnc: 223 mg/dL
Hgb A1c MFr Bld: 9.4 % — ABNORMAL HIGH (ref 4.8–5.6)

## 2023-03-02 LAB — LIPID PANEL
Chol/HDL Ratio: 2.6 ratio (ref 0.0–4.4)
Cholesterol, Total: 129 mg/dL (ref 100–199)
HDL: 50 mg/dL (ref >39)
LDL Chol Calc (NIH): 59 mg/dL (ref 0–99)
Triglycerides: 109 mg/dL (ref 0–149)
VLDL Cholesterol Cal: 20 mg/dL (ref 5–40)

## 2023-03-07 ENCOUNTER — Other Ambulatory Visit: Payer: Self-pay

## 2023-03-08 ENCOUNTER — Other Ambulatory Visit: Payer: Self-pay | Admitting: Nurse Practitioner

## 2023-03-08 DIAGNOSIS — Z1231 Encounter for screening mammogram for malignant neoplasm of breast: Secondary | ICD-10-CM

## 2023-03-09 ENCOUNTER — Ambulatory Visit
Admission: RE | Admit: 2023-03-09 | Discharge: 2023-03-09 | Disposition: A | Discharge status: Home / Self Care | Payer: Medicaid Other | Source: Ambulatory Visit

## 2023-03-09 ENCOUNTER — Other Ambulatory Visit: Payer: Self-pay

## 2023-03-09 ENCOUNTER — Encounter: Payer: Self-pay | Admitting: Hematology

## 2023-03-09 DIAGNOSIS — Z1231 Encounter for screening mammogram for malignant neoplasm of breast: Secondary | ICD-10-CM

## 2023-03-22 ENCOUNTER — Ambulatory Visit (INDEPENDENT_AMBULATORY_CARE_PROVIDER_SITE_OTHER): Payer: Medicaid Other | Admitting: Podiatry

## 2023-03-22 ENCOUNTER — Encounter: Payer: Self-pay | Admitting: Podiatry

## 2023-03-22 DIAGNOSIS — B351 Tinea unguium: Secondary | ICD-10-CM

## 2023-03-22 DIAGNOSIS — M79675 Pain in left toe(s): Secondary | ICD-10-CM

## 2023-03-22 DIAGNOSIS — L84 Corns and callosities: Secondary | ICD-10-CM

## 2023-03-22 DIAGNOSIS — M79674 Pain in right toe(s): Secondary | ICD-10-CM

## 2023-03-22 DIAGNOSIS — E1142 Type 2 diabetes mellitus with diabetic polyneuropathy: Secondary | ICD-10-CM

## 2023-03-25 ENCOUNTER — Other Ambulatory Visit: Payer: Self-pay

## 2023-03-27 NOTE — Progress Notes (Signed)
Subjective:  Patient ID: Kristina Adams, female    DOB: [birth year],  MRN: [id number]  Chief Complaint  Patient presents with   Diabetes    Patient is here for routine Diabetic footcare    57 y.o. female presents with the above complaint. History confirmed with patient.  Her nails are thickened elongated causing discomfort, calluses building up again as well.  She says he is still working her blood sugar getting under control.  Objective:  Physical Exam: warm, good capillary refill, no trophic changes or ulcerative lesions, and normal DP and PT pulses.  There are dystrophic yellowed discolored nail plates with subungual debris and multiple calluses   Assessment:   1. Pain due to onychomycosis of toenails of both feet   2. Type 2 diabetes mellitus with polyneuropathy (HCC)   3. Callus      Plan:  Patient was evaluated and treated and all questions answered.   Patient educated on diabetes. Discussed proper diabetic foot care and discussed risks and complications of disease. Educated patient in depth on reasons to return to the office immediately should he/she discover anything concerning or new on the feet. All questions answered. Discussed proper shoes as well.   Discussed the etiology and treatment options for the condition in detail with the patient.  Recommended debridement of the nails today. Sharp and mechanical debridement performed of all painful and mycotic nails today. Nails debrided in length and thickness using a nail nipper to level of comfort. Discussed treatment options including appropriate shoe gear. Follow up as needed for painful nails.   All symptomatic hyperkeratoses were safely debrided as a courtesy with a sterile #15 blade to patient's level of comfort without incident. We discussed preventative and palliative care of these lesions including supportive and accommodative shoegear, padding, prefabricated and custom molded accommodative orthoses, use of a pumice stone  and lotions/creams daily.   No follow-ups on file.

## 2023-04-05 ENCOUNTER — Other Ambulatory Visit: Payer: Self-pay

## 2023-04-07 ENCOUNTER — Other Ambulatory Visit: Payer: Self-pay

## 2023-04-13 ENCOUNTER — Other Ambulatory Visit: Payer: Self-pay | Admitting: Nurse Practitioner

## 2023-04-13 ENCOUNTER — Other Ambulatory Visit: Payer: Medicaid Other

## 2023-04-13 ENCOUNTER — Telehealth: Payer: Self-pay | Admitting: Nurse Practitioner

## 2023-04-13 DIAGNOSIS — R053 Chronic cough: Secondary | ICD-10-CM

## 2023-04-13 MED ORDER — BENZONATATE 100 MG PO CAPS
100.0000 mg | ORAL_CAPSULE | Freq: Two times a day (BID) | ORAL | 0 refills | Status: DC | PRN
Start: 2023-04-13 — End: 2023-05-27
  Filled 2023-04-13: qty 30, 8d supply, fill #0

## 2023-04-13 NOTE — Telephone Encounter (Signed)
Tessalon sent. Medicaid does not cover cough syrups

## 2023-04-13 NOTE — Telephone Encounter (Signed)
Pt is calling in requesting cough medication be sent in for her. Pt says she's getting over a cold and has a lingering cough that won't go away. Please advise.

## 2023-04-14 ENCOUNTER — Ambulatory Visit: Payer: Medicaid Other | Attending: Family Medicine

## 2023-04-14 ENCOUNTER — Encounter: Payer: Self-pay | Admitting: Hematology

## 2023-04-14 ENCOUNTER — Other Ambulatory Visit: Payer: Self-pay

## 2023-04-14 DIAGNOSIS — E785 Hyperlipidemia, unspecified: Secondary | ICD-10-CM

## 2023-04-14 NOTE — Progress Notes (Signed)
Patient was in the lab chair.  States she was told to return to office for Lipid panel repeat since she started a new dose of cholesterol medication.

## 2023-04-15 LAB — LIPID PANEL
Chol/HDL Ratio: 2.9 {ratio} (ref 0.0–4.4)
Cholesterol, Total: 149 mg/dL (ref 100–199)
HDL: 51 mg/dL (ref >39)
LDL Chol Calc (NIH): 75 mg/dL (ref 0–99)
Triglycerides: 129 mg/dL (ref 0–149)
VLDL Cholesterol Cal: 23 mg/dL (ref 5–40)

## 2023-04-18 ENCOUNTER — Ambulatory Visit (INDEPENDENT_AMBULATORY_CARE_PROVIDER_SITE_OTHER): Payer: Medicaid Other | Admitting: Internal Medicine

## 2023-04-18 ENCOUNTER — Other Ambulatory Visit: Payer: Self-pay

## 2023-04-18 ENCOUNTER — Encounter: Payer: Self-pay | Admitting: Internal Medicine

## 2023-04-18 VITALS — BP 134/80 | HR 84 | Ht 70.0 in | Wt 308.0 lb

## 2023-04-18 DIAGNOSIS — Z794 Long term (current) use of insulin: Secondary | ICD-10-CM

## 2023-04-18 DIAGNOSIS — E042 Nontoxic multinodular goiter: Secondary | ICD-10-CM | POA: Diagnosis not present

## 2023-04-18 DIAGNOSIS — E1165 Type 2 diabetes mellitus with hyperglycemia: Secondary | ICD-10-CM

## 2023-04-18 DIAGNOSIS — E0590 Thyrotoxicosis, unspecified without thyrotoxic crisis or storm: Secondary | ICD-10-CM | POA: Diagnosis not present

## 2023-04-18 DIAGNOSIS — E119 Type 2 diabetes mellitus without complications: Secondary | ICD-10-CM

## 2023-04-18 DIAGNOSIS — E1142 Type 2 diabetes mellitus with diabetic polyneuropathy: Secondary | ICD-10-CM | POA: Diagnosis not present

## 2023-04-18 LAB — POCT GLUCOSE (DEVICE FOR HOME USE): POC Glucose: 324 mg/dL — AB (ref 70–99)

## 2023-04-18 MED ORDER — GLIPIZIDE 10 MG PO TABS
20.0000 mg | ORAL_TABLET | Freq: Two times a day (BID) | ORAL | 2 refills | Status: DC
  Filled 2023-04-18: qty 360, 90d supply, fill #0
  Filled 2023-07-28: qty 360, 90d supply, fill #1
  Filled 2023-10-31: qty 360, 90d supply, fill #2

## 2023-04-18 MED ORDER — DEXCOM G7 SENSOR MISC
2 refills | Status: DC
  Filled 2023-04-18: qty 9, fill #0
  Filled 2023-05-10: qty 3, 30d supply, fill #0
  Filled 2023-06-13: qty 3, 30d supply, fill #1
  Filled 2023-07-28: qty 3, 30d supply, fill #2
  Filled 2023-09-06: qty 3, 30d supply, fill #3
  Filled 2023-11-11 – 2023-11-15 (×2): qty 3, 30d supply, fill #4
  Filled 2023-12-13: qty 3, 30d supply, fill #5
  Filled 2024-01-24: qty 3, 30d supply, fill #6
  Filled 2024-03-08: qty 3, 30d supply, fill #7
  Filled 2024-04-10: qty 3, 30d supply, fill #8

## 2023-04-18 MED ORDER — MOUNJARO 2.5 MG/0.5ML ~~LOC~~ SOAJ
2.5000 mg | SUBCUTANEOUS | 3 refills | Status: DC
  Filled 2023-04-18: qty 2, 28d supply, fill #0

## 2023-04-18 MED ORDER — BD PEN NEEDLE MINI U/F 31G X 5 MM MISC
1.0000 | Freq: Two times a day (BID) | 3 refills | Status: DC
Start: 2023-04-18 — End: 2023-12-14
  Filled 2023-04-18: qty 100, 50d supply, fill #0
  Filled 2023-06-13: qty 100, 50d supply, fill #1
  Filled 2023-07-29: qty 100, 50d supply, fill #2
  Filled 2023-10-16: qty 200, 100d supply, fill #3

## 2023-04-18 MED ORDER — METHIMAZOLE 5 MG PO TABS
5.0000 mg | ORAL_TABLET | Freq: Two times a day (BID) | ORAL | 3 refills | Status: DC
  Filled 2023-04-18: qty 180, 90d supply, fill #0
  Filled 2023-08-04 – 2023-08-21 (×2): qty 180, 90d supply, fill #1

## 2023-04-18 MED ORDER — LANTUS SOLOSTAR 100 UNIT/ML ~~LOC~~ SOPN
40.0000 [IU] | PEN_INJECTOR | Freq: Two times a day (BID) | SUBCUTANEOUS | 4 refills | Status: DC
  Filled 2023-04-18 – 2023-06-13 (×10): qty 75, 94d supply, fill #0
  Filled 2023-06-14: qty 75, 88d supply, fill #0
  Filled 2023-09-06: qty 72, 90d supply, fill #1
  Filled 2023-09-06: qty 75, 88d supply, fill #1
  Filled 2023-12-04: qty 72, 90d supply, fill #2

## 2023-04-18 NOTE — Progress Notes (Unsigned)
Name: AERITH CANAL  MRN/ DOB: [id number], 1966/05/09   Age/ Sex: 57 y.o., female    PCP: Claiborne Rigg, NP   Reason for Endocrinology Evaluation: Type 2 Diabetes Mellitus     Date of Initial Endocrinology Visit: 04/19/2023     PATIENT IDENTIFIER: Ms. HYDEIA MCATEE is a 57 y.o. female with a past medical history of DM, HTN, IBS, GERD. The patient presented for initial endocrinology clinic visit on 04/19/2023 for consultative assistance with her diabetes management.    HPI: Ms. Tafolla was    Diagnosed with DM 1988 Prior Medications tried/Intolerance: Metformin - diarrhea . Jardiance- yeast infection  Currently checking blood sugars multiple  x / day,  through Dexcom .  Hypoglycemia episodes : yes               Symptoms: yes                  Hemoglobin A1c has ranged from 7.9% in 2022, peaking at 10.1% in 2024.   In terms of diet, the patient eats 2 meals a day, snacks occasionally. Avoids sugar sweetened beverages   Denies nausea or vomiting  She is IBS-C  follows with GI  She stopped Ozempic due to constipation so she held it for ~ 6 weeks.   THYROID HISTORY: Patient was diagnosed with multinodular goiter in 2007.She is s/p benign FNA of the right inferior nodule 3.1 cm , isthmic nodule 2.4 cm 08/18/2011 She is also s/p benign FNA of the right mid 2.1 cm nodule and left superior 2 cm nodule on/05/2017  She has also been diagnosed with hyperthyroidism and has been on methimazole for years    Denies local neck swelling  Denies dysphagia  She does have insomnia and excessive sweating    HOME ENDOCRINE REGIMEN: Ozempic 1 mg weekly - not taking  Glipizide 10 mg BID Lantus 45 units QAM and 45 units QPM Methimazole 5 mg BID     Statin: yes ACE-I/ARB: yes   METER DOWNLOAD SUMMARY:   DIABETIC COMPLICATIONS: Microvascular complications:  Neuropathy  Denies: CKD, retinopathy  Last eye exam: Completed 01/2023  Macrovascular complications:   Denies: CAD, PVD,  CVA   PAST HISTORY: Past Medical History:  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/03/2011   GERD (gastroesophageal reflux disease)    H/O hiatal hernia    HLD (hyperlipidemia)    Hypertension 12/03/2011   Hyperthyroidism    IBS (irritable bowel syndrome)    Morbid obesity with BMI of 45.0-49.9, adult (HCC) 12/03/2011   Sleep apnea 12/03/2011   last sleep study May 2013   Past Surgical History:  Past Surgical History:  Procedure Laterality Date   BIOPSY  07/29/2022   Procedure: BIOPSY;  Surgeon: Lemar Lofty., MD;  Location: [facility];  Service: Gastroenterology;;   CESAREAN SECTION     x 2   COLONOSCOPY WITH PROPOFOL N/A 07/29/2022   Procedure: COLONOSCOPY WITH PROPOFOL;  Surgeon: Lemar Lofty., MD;  Location: [facility];  Service: Gastroenterology;  Laterality: N/A;   ESOPHAGOGASTRODUODENOSCOPY (EGD) WITH PROPOFOL N/A 07/29/2022   Procedure: ESOPHAGOGASTRODUODENOSCOPY (EGD) WITH PROPOFOL;  Surgeon: Meridee Score Netty Starring., MD;  Location: [facility];  Service: Gastroenterology;  Laterality: N/A;  EUS N/A 07/29/2022   Procedure: UPPER ENDOSCOPIC ULTRASOUND (EUS) LINEAR;  Surgeon: Lemar Lofty., MD;  Location: [facility];  Service: Gastroenterology;  Laterality: N/A;   FINE NEEDLE ASPIRATION N/A 07/29/2022   Procedure: FINE NEEDLE ASPIRATION (FNA) LINEAR;  Surgeon: Lemar Lofty., MD;  Location: [facility];  Service: Gastroenterology;  Laterality: N/A;   LAPAROSCOPIC APPENDECTOMY  12/03/2011   Procedure: APPENDECTOMY LAPAROSCOPIC;  Surgeon: Lodema Pilot, DO;  Location: [facility];  Service: General;  Laterality: N/A;  drainage of intra-abdominal abscess    PARS PLANA VITRECTOMY Left 08/17/2012   Procedure:  PARS PLANA VITRECTOMY WITH 25 GAUGE;  Surgeon: Sherrie George, MD;  Location[facility];  Service: Ophthalmology;  Laterality: Left;   POLYPECTOMY  07/29/2022   Procedure: POLYPECTOMY;  Surgeon: Mansouraty, Netty Starring., MD;  Location: [facility];  Service: Gastroenterology;;   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 She has never used smokeless tobacco. She reports that she does not currently use alcohol after a past usage of about 1.0 standard drink of alcohol per week. She reports that she does not use drugs. Family History:  Family History  Problem Relation Age of Onset   Breast cancer Mother 46   Diabetes Mother    Hypertension Mother    Kidney failure Father    Heart disease Father        No details   Diabetes Sister    Hypertension Sister    Hyperlipidemia Sister    Diabetes Maternal Uncle    Cancer Maternal Uncle        type unknown   Diabetes Paternal Aunt    Hypertension Paternal Aunt    Breast cancer Paternal Aunt    Fibromyalgia Paternal Aunt    Breast cancer Maternal Grandmother    Hypertension Maternal Grandmother    Kidney disease Maternal Grandfather    Cancer Maternal Grandfather        type unknown   Diabetes Paternal Grandmother    Diabetes Paternal Grandfather    Diabetes Daughter    Colon cancer Neg Hx    Esophageal cancer Neg Hx    Inflammatory bowel disease Neg Hx    Liver disease Neg Hx    Pancreatic cancer Neg Hx    Rectal cancer Neg Hx    Stomach cancer Neg Hx      HOME MEDICATIONS: Allergies as of 04/18/2023       Reactions   Latex Other (See Comments)   Infection in left middle finger from latex gloves due to moisture.   Augmentin [amoxicillin-pot Clavulanate] Itching   Burning and itching,  back pains, elevated sugar levels   Iohexol     Code: RASH, Desc: PATIENT CALLED 06/17/08-STATED AFTER 6 HOURS POST IV CONTRAST, HANDS,SCALP AND GROIN AREA DEVELOPED ITCHING,BURNING SENSATION, Onset Date: [id number]        Medication List        Accurate as of April 18, 2023 11:59 PM. If you have any questions, ask your nurse or doctor.          STOP taking these medications    Dexcom G7 Receiver Devi Stopped by: Johnney Ou Caysie Minnifield   Ozempic (0.25 or 0.5 MG/DOSE) 2 MG/3ML Sopn Generic drug: Semaglutide(0.25 or 0.5MG /DOS) Stopped  by: Johnney Ou Kedrick Mcnamee   Ozempic (1 MG/DOSE) 4 MG/3ML Sopn Generic drug: Semaglutide (1 MG/DOSE) Stopped by: Johnney Ou Elanora Quin       TAKE these medications    ascorbic acid 500 MG tablet Commonly known as: VITAMIN C Take 1,000 mg by mouth daily.   aspirin EC 81 MG tablet Take by mouth.   B-D UF III MINI PEN NEEDLES 31G X 5 MM Misc Generic drug: Insulin Pen Needle Inject 1 each as directed in the morning and at bedtime. What changed:  how much to take how to take this when to take this additional instructions Changed by: Johnney Ou Monseratt Ledin   benzonatate 100 MG capsule Commonly known as: TESSALON Take 1-2 capsules (100-200 mg total) by mouth 2 (two) times daily as needed for cough.   Blood Pressure Monitor Devi Please provide patient with insurance approved blood pressure monitor   cyclobenzaprine 10 MG tablet Commonly known as: FLEXERIL TAKE 1 TABLET (10 MG TOTAL) BY MOUTH 3 (THREE) TIMES DAILY AS NEEDED FOR MUSCLE SPASMS.   Dexcom G7 Sensor Misc Use to check blood sugar continuously throughout the day. E11.9   diclofenac Sodium 1 % Gel Commonly known as: VOLTAREN APPLY 4 G TOPICALLY 4 (FOUR) TIMES DAILY. What changed:  when to take this reasons to take this   fenofibrate 145 MG tablet Commonly known as: TRICOR Take 1 tablet (145 mg total) by mouth daily.   gabapentin 300 MG capsule Commonly known as:  NEURONTIN Take 1 capsule (300 mg total) by mouth at bedtime.   glipiZIDE 10 MG tablet Commonly known as: Glucotrol Take 2 tablets (20 mg total) by mouth 2 (two) times daily before a meal. What changed: how much to take Changed by: Johnney Ou Adriena Manfre   hydrochlorothiazide 25 MG tablet Commonly known as: HYDRODIURIL TAKE 1 TABLET (25 MG TOTAL) BY MOUTH DAILY.   Lantus SoloStar 100 UNIT/ML Solostar Pen Generic drug: insulin glargine Inject 40 Units into the skin 2 (two) times daily. What changed: See the new instructions. Changed by: Johnney Ou Mitchael Luckey   Linzess 145 MCG Caps capsule Generic drug: linaclotide Take 1 capsule (145 mcg total) by mouth daily before breakfast.   lisinopril 40 MG tablet Commonly known as: ZESTRIL Take 1 tablet (40 mg total) by mouth daily.   methimazole 5 MG tablet Commonly known as: TAPAZOLE Take 1 tablet (5 mg total) by mouth 2 (two) times daily.   Mounjaro 2.5 MG/0.5ML Pen Generic drug: tirzepatide Inject 2.5 mg into the skin once a week. Started by: Johnney Ou Aziyah Provencal   Multi-Vitamin tablet Take 1 tablet by mouth daily.   mupirocin ointment 2 % Commonly known as: BACTROBAN Apply 1 Application topically daily.   OneTouch Delica Plus Lancet33G Misc Testing twice daily   OneTouch Verio Flex System w/Device Kit Use up to 2 times daily as directed.   OneTouch Verio test strip Generic drug: glucose blood Testing twice daily.   pantoprazole 40 MG tablet Commonly known as: PROTONIX Take 1 tablet (40 mg total) by mouth 2 (two) times daily.   rosuvastatin 5 MG tablet Commonly known as: Crestor Take 1 tablet (5 mg total) by mouth daily.   Vitamin D-3 125 MCG (5000 UT) Tabs Take 1 tablet by mouth daily.         ALLERGIES: Allergies  Allergen Reactions   Latex Other (See Comments)    Infection in left middle finger from latex gloves due to moisture.   Augmentin [Amoxicillin-Pot Clavulanate] Itching  Burning and itching,  back pains, elevated sugar levels   Iohexol      Code: RASH, Desc: PATIENT CALLED 06/17/08-STATED AFTER 6 HOURS POST IV CONTRAST, HANDS,SCALP AND GROIN AREA DEVELOPED ITCHING,BURNING SENSATION, Onset Date: [id number]      REVIEW OF SYSTEMS: A comprehensive ROS was conducted with the patient and is negative except as per HPI     OBJECTIVE:   VITAL SIGNS: BP 134/80 (BP Location: Left Arm, Patient Position: Sitting, Cuff Size: Large)   Pulse 84   Ht 5\' 10"  (1.778 m)   Wt (!) 308 lb (139.7 kg)   LMP 08/09/2012   SpO2 99%   BMI 44.19 kg/m    PHYSICAL EXAM:  General: Pt appears well and is in NAD  Neck: General: Supple without adenopathy or carotid bruits. Thyroid: Thyroid size normal.  No goiter or nodules appreciated.   Lungs: Clear with good BS bilat   Heart: RRR   Abdomen:  soft, nontender  Extremities:  Lower extremities - No pretibial edema.   Neuro: MS is good with appropriate affect, pt is alert and Ox3    DM foot exam: 03/22/2023 per podiatry    DATA REVIEWED:  Lab Results  Component Value Date   HGBA1C 9.4 (H) 03/01/2023   HGBA1C 10.1 (A) 10/20/2022   HGBA1C 8.8 (A) 06/02/2022    Latest Reference Range & Units 03/01/23 11:49  Sodium 134 - 144 mmol/L 137  Potassium 3.5 - 5.2 mmol/L 4.7  Chloride 96 - 106 mmol/L 98  CO2 20 - 29 mmol/L 23  Glucose 70 - 99 mg/dL 540 (H)  BUN 6 - 24 mg/dL 25 (H)  Creatinine 9.81 - 1.00 mg/dL 1.91 (H)  Calcium 8.7 - 10.2 mg/dL 9.4  BUN/Creatinine Ratio 9 - 23  22  eGFR >59 mL/min/1.73 56 (L)  Alkaline Phosphatase 44 - 121 IU/L 66  Albumin 3.8 - 4.9 g/dL 4.1  AST 0 - 40 IU/L 15  ALT 0 - 32 IU/L 10  Total Protein 6.0 - 8.5 g/dL 6.8  Total Bilirubin 0.0 - 1.2 mg/dL <4.7     Latest Reference Range & Units 04/14/23 15:51  Total CHOL/HDL Ratio 0.0 - 4.4 ratio 2.9  Cholesterol, Total 100 - 199 mg/dL 829  HDL Cholesterol >56 mg/dL 51  Triglycerides 0 - 213 mg/dL 086  VLDL Cholesterol Cal 5 - 40 mg/dL 23  LDL Chol Calc (NIH) 0 -  99 mg/dL 75    Latest Reference Range & Units 03/01/23 11:49  TSH 0.450 - 4.500 uIU/mL 1.560  Thyroxine (T4) 4.5 - 12.0 ug/dL 7.1  Free Thyroxine Index 1.2 - 4.9  1.8  T3 Uptake Ratio 24 - 39 % 25    In office 324 mg/dL    ASSESSMENT / PLAN / RECOMMENDATIONS:   1) Type 2 Diabetes Mellitus, Poorly controlled, With Neuropathic  complications - Most recent A1c of 9.4 %. Goal A1c < 7.0 %.    Plan: GENERAL: I have discussed with the patient the pathophysiology of diabetes. We went over the natural progression of the disease. We stressed the importance of lifestyle changes. I explained the complications associated with diabetes including retinopathy, nephropathy, neuropathy as well as increased risk of cardiovascular disease. We went over the benefit seen with glycemic control.   I explained to the patient that diabetic patients are at higher than normal risk for amputations.  Intolerant to Metformin  Intolerant to [employer] to [employer]  I have recommended starting Mounjaro Will increase glipizide as below,  emphasized the importance of taking glipizide 15-20 minutes before the first and last meal of the day I will also decrease Lantus to prevent hypoglycemia  MEDICATIONS: Start Mounjaro 2.5 mg weekly Increase glipizide 10 mg, 2 tabs before breakfast and 2 tabs before supper Decrease Lantus 40 units twice daily  EDUCATION / INSTRUCTIONS: BG monitoring instructions: Patient is instructed to check her blood sugars 3 times a day, before each meal . Call [facility] if: BG persistently < 70  I reviewed the Rule of 15 for the treatment of hypoglycemia in detail with the patient. Literature supplied.   2) Diabetic complications:  Eye: Does not have known diabetic retinopathy.  Neuro/ Feet: Does have known diabetic peripheral neuropathy. Renal: Patient does not have known baseline CKD. She is  on an ACEI/ARB at present.  3) Hyperthyroidism  -Patient is  clinically and biochemically euthyroid -Tolerating methimazole -No change  Medication Continue methimazole 5 mg twice daily  4) MNG:   -No local neck symptoms -She is s/p FNA of multiple nodules in the past -Will proceed with repeat ultrasound   Signed electronically by: Lyndle Herrlich, MD  [facility]  [facility] [address] [name], [place] [address] Phone: [phone] FAX: [phone]   CC: Claiborne Rigg, NP [address] [place] [address] Phone: [phone]  Fax: [phone]    Return to Endocrinology clinic as below: Future Appointments  Date Time Provider Department Center  04/21/2023  3:00 PM [facility] [place] 2 [id number] [facility]  05/27/2023 11:10 AM Mansouraty, Netty Starring., MD [facility]  06/01/2023  3:45 PM Freddie Breech, DPM [facility]  06/03/2023 10:30 AM Claiborne Rigg, NP [facility] None  08/23/2023  1:20 PM Amel Kitch, Konrad Dolores, MD [facility] None

## 2023-04-18 NOTE — Patient Instructions (Addendum)
Start Mounjaro 2.5 mg once weekly  Increase Glipizide 10mg , 2 tablets before Breakfast and 2 tablets before Supper Decrease Lantus to 40 units twice daily    HOW TO TREAT LOW BLOOD SUGARS (Blood sugar LESS THAN 70 MG/DL) Please follow the RULE OF 15 for the treatment of hypoglycemia treatment (when your (blood sugars are less than 70 mg/dL)   STEP 1: Take 15 grams of carbohydrates when your blood sugar is low, which includes:  3-4 GLUCOSE TABS  OR 3-4 OZ OF JUICE OR REGULAR SODA OR ONE TUBE OF GLUCOSE GEL    STEP 2: RECHECK blood sugar in 15 MINUTES STEP 3: If your blood sugar is still low at the 15 minute recheck --> then, go back to STEP 1 and treat AGAIN with another 15 grams of carbohydrates.

## 2023-04-19 ENCOUNTER — Other Ambulatory Visit: Payer: Self-pay

## 2023-04-20 ENCOUNTER — Other Ambulatory Visit: Payer: Self-pay

## 2023-04-20 ENCOUNTER — Other Ambulatory Visit: Payer: Self-pay | Admitting: Nurse Practitioner

## 2023-04-20 NOTE — Telephone Encounter (Signed)
Patient has picked up Rx.

## 2023-04-21 ENCOUNTER — Ambulatory Visit
Admission: RE | Admit: 2023-04-21 | Discharge: 2023-04-21 | Disposition: A | Discharge status: Home / Self Care | Payer: Medicaid Other | Source: Ambulatory Visit | Attending: Internal Medicine | Admitting: Internal Medicine

## 2023-04-21 ENCOUNTER — Other Ambulatory Visit: Payer: Self-pay

## 2023-04-21 DIAGNOSIS — E0590 Thyrotoxicosis, unspecified without thyrotoxic crisis or storm: Secondary | ICD-10-CM

## 2023-04-22 ENCOUNTER — Other Ambulatory Visit: Payer: Self-pay

## 2023-04-27 ENCOUNTER — Telehealth: Payer: Self-pay | Admitting: Internal Medicine

## 2023-04-27 NOTE — Telephone Encounter (Signed)
Can you please contact the patient and let her know that her thyroid ultrasound continues to show multiple thyroid nodules but ONE of them meets criteria for biopsy.  The patient has had biopsies years ago, so we will be the same process   Please take permission from the patient to proceed with thyroid nodule biopsy   Thanks

## 2023-04-28 NOTE — Telephone Encounter (Signed)
Patient would like to move forward with biopsy.

## 2023-04-28 NOTE — Telephone Encounter (Signed)
LMTCB and also sent a mychart message.

## 2023-04-29 ENCOUNTER — Other Ambulatory Visit: Payer: Self-pay | Admitting: Internal Medicine

## 2023-04-29 DIAGNOSIS — E042 Nontoxic multinodular goiter: Secondary | ICD-10-CM

## 2023-04-29 NOTE — Telephone Encounter (Signed)
FNA order for nodule # 5 was placed

## 2023-05-04 ENCOUNTER — Other Ambulatory Visit: Payer: Self-pay

## 2023-05-05 ENCOUNTER — Other Ambulatory Visit (HOSPITAL_BASED_OUTPATIENT_CLINIC_OR_DEPARTMENT_OTHER): Payer: Self-pay

## 2023-05-10 ENCOUNTER — Other Ambulatory Visit: Payer: Self-pay

## 2023-05-11 ENCOUNTER — Telehealth: Payer: Self-pay

## 2023-05-11 ENCOUNTER — Other Ambulatory Visit (HOSPITAL_COMMUNITY): Payer: Self-pay

## 2023-05-11 NOTE — Telephone Encounter (Signed)
Pharmacy Patient Advocate Encounter   Received notification from CoverMyMeds that prior authorization for [facility] is required/requested.   Insurance verification completed.   The patient is insured through [facility] .   Per test claim: PA required; PA submitted to above mentioned insurance via CoverMyMeds Key/confirmation #/EOC [id number] Status is pending

## 2023-05-12 NOTE — Telephone Encounter (Signed)
Pharmacy Patient Advocate Encounter  Received notification from [facility] that Prior Authorization for Kristina Adams has been DENIED.  Full denial letter will be uploaded to the media tab. See denial reason below.

## 2023-05-13 ENCOUNTER — Other Ambulatory Visit: Payer: Self-pay

## 2023-05-13 ENCOUNTER — Telehealth: Payer: Medicaid Other | Admitting: Nurse Practitioner

## 2023-05-13 MED ORDER — TRULICITY 0.75 MG/0.5ML ~~LOC~~ SOAJ
0.7500 mg | SUBCUTANEOUS | 3 refills | Status: DC
  Filled 2023-05-13: qty 2, 28d supply, fill #0
  Filled 2023-07-28: qty 2, 28d supply, fill #1
  Filled 2023-10-16: qty 2, 28d supply, fill #2
  Filled 2023-11-06: qty 2, 28d supply, fill #3
  Filled 2023-12-07: qty 2, 28d supply, fill #4

## 2023-05-13 NOTE — Addendum Note (Signed)
Addended by: Scarlette Shorts on: 05/13/2023 03:26 PM   Modules accepted: Orders

## 2023-05-13 NOTE — Telephone Encounter (Signed)
Rhashard calling from [employer] is calling to check on the status of order for equipment supplies- automatic BP monitor Dial. CB- [phone] Fax- [phone]

## 2023-05-13 NOTE — Telephone Encounter (Signed)
Returned call and spoke to Golf and made aware that have not received any form. Per Lanora Manis she will refax form.

## 2023-05-13 NOTE — Telephone Encounter (Signed)
Patient okay with trying the low dose of Trulicity

## 2023-05-16 ENCOUNTER — Ambulatory Visit
Admission: RE | Admit: 2023-05-16 | Discharge: 2023-05-16 | Disposition: A | Discharge status: Home / Self Care | Payer: Medicaid Other | Source: Ambulatory Visit | Attending: Nurse Practitioner | Admitting: Nurse Practitioner

## 2023-05-16 ENCOUNTER — Ambulatory Visit: Payer: Medicaid Other | Attending: Nurse Practitioner | Admitting: Nurse Practitioner

## 2023-05-16 ENCOUNTER — Encounter: Payer: Self-pay | Admitting: Nurse Practitioner

## 2023-05-16 ENCOUNTER — Ambulatory Visit: Payer: Self-pay

## 2023-05-16 ENCOUNTER — Other Ambulatory Visit: Payer: Self-pay

## 2023-05-16 VITALS — BP 155/76 | HR 74 | Ht 70.0 in | Wt 310.4 lb

## 2023-05-16 DIAGNOSIS — R053 Chronic cough: Secondary | ICD-10-CM

## 2023-05-16 DIAGNOSIS — J40 Bronchitis, not specified as acute or chronic: Secondary | ICD-10-CM

## 2023-05-16 DIAGNOSIS — R109 Unspecified abdominal pain: Secondary | ICD-10-CM

## 2023-05-16 LAB — POCT URINALYSIS DIP (CLINITEK)
Bilirubin, UA: NEGATIVE
Blood, UA: NEGATIVE
Glucose, UA: 500 mg/dL — AB
Ketones, POC UA: NEGATIVE mg/dL
Leukocytes, UA: NEGATIVE
Nitrite, UA: NEGATIVE
POC PROTEIN,UA: NEGATIVE
Spec Grav, UA: 1.015 (ref 1.010–1.025)
Urobilinogen, UA: 0.2 U/dL
pH, UA: 6.5 (ref 5.0–8.0)

## 2023-05-16 MED ORDER — AZITHROMYCIN 250 MG PO TABS
ORAL_TABLET | ORAL | 0 refills | Status: AC
Start: 2023-05-16 — End: 2023-05-21
  Filled 2023-05-16: qty 6, 5d supply, fill #0

## 2023-05-16 MED ORDER — ALBUTEROL SULFATE HFA 108 (90 BASE) MCG/ACT IN AERS
2.0000 | INHALATION_SPRAY | Freq: Four times a day (QID) | RESPIRATORY_TRACT | 0 refills | Status: AC | PRN
  Filled 2023-05-16: qty 18, 25d supply, fill #0

## 2023-05-16 MED ORDER — PREDNISONE 20 MG PO TABS
20.0000 mg | ORAL_TABLET | Freq: Every day | ORAL | 0 refills | Status: AC
Start: 2023-05-16 — End: 2023-05-21
  Filled 2023-05-16: qty 5, 5d supply, fill #0

## 2023-05-16 NOTE — Progress Notes (Signed)
Assessment & Plan:  Kristina Adams was seen today for cough.  Diagnoses and all orders for this visit:  Bronchitis -     azithromycin (ZITHROMAX) 250 MG tablet; Take 2 tablets by mouth on day 1, then 1 tablet daily on days 2 through 5 -     predniSONE (DELTASONE) 20 MG tablet; Take 1 tablet (20 mg total) by mouth daily with breakfast for 5 days. -     albuterol (VENTOLIN HFA) 108 (90 Base) MCG/ACT inhaler; Inhale 2 puffs into the lungs every 6 (six) hours as needed for wheezing or shortness of breath. -     CBC with Differential -     DG Chest 2 View; Future Recommend smoking cessation.   Left flank pain -     POCT URINALYSIS DIP (CLINITEK) -     CBC with Differential  Chronic cough -     DG Chest 2 View; Future    Patient has been counseled on age-appropriate routine health concerns for screening and prevention. These are reviewed and up-to-date. Referrals have been placed accordingly. Immunizations are up-to-date or declined.    Subjective:   Chief Complaint  Patient presents with   Cough    Kristina Adams 57 y.o. female presents to office today with complaints of chronic cough   Cough Symptoms began 1 month ago.  The cough is non-productive, with shortness of breath, chest is painful during coughing, harsh, nocturnal, barking, worsening over time and is aggravated by nothing Associated symptoms include: left sided flank pain and spasms in lower left back which radiates to front of chest underneath breasts . Patient does not have new pets. Patient does not have a history of asthma. Patient does not have a history of environmental allergens. Patient did not have recent travel. Patient does have a history of smoking. Patient  does not have recent Chest X-ray.     Review of Systems  Constitutional:  Negative for fever, malaise/fatigue and weight loss.  HENT: Negative.  Negative for nosebleeds.   Eyes: Negative.  Negative for blurred vision, double vision and photophobia.   Respiratory:  Positive for cough and shortness of breath.   Cardiovascular:  Positive for chest pain. Negative for palpitations and leg swelling.  Gastrointestinal: Negative.  Negative for heartburn, nausea and vomiting.  Genitourinary:  Positive for flank pain and frequency. Negative for dysuria, hematuria and urgency.  Musculoskeletal:  Positive for back pain and myalgias.  Neurological: Negative.  Negative for dizziness, focal weakness, seizures and headaches.  Psychiatric/Behavioral: Negative.  Negative for suicidal ideas.     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/03/2011   GERD (gastroesophageal reflux disease)    H/O hiatal hernia    HLD (hyperlipidemia)    Hypertension 12/03/2011   Hyperthyroidism    IBS (irritable bowel syndrome)    Morbid obesity with BMI of 45.0-49.9, adult (HCC) 12/03/2011   Sleep apnea 12/03/2011   last sleep study May 2013    Past Surgical History:  Procedure Laterality Date   BIOPSY  07/29/2022   Procedure: BIOPSY;  Surgeon: Lemar Lofty., MD;  Location: [facility];  Service: Gastroenterology;;   CESAREAN SECTION     x 2   COLONOSCOPY WITH PROPOFOL  N/A 07/29/2022   Procedure: COLONOSCOPY WITH PROPOFOL;  Surgeon: Meridee Score Netty Starring., MD;  Location: [facility];  Service: Gastroenterology;  Laterality: N/A;   ESOPHAGOGASTRODUODENOSCOPY (EGD) WITH PROPOFOL N/A 07/29/2022   Procedure: ESOPHAGOGASTRODUODENOSCOPY (EGD) WITH PROPOFOL;  Surgeon: Meridee Score Netty Starring., MD;  Location: [facility];  Service: Gastroenterology;  Laterality: N/A;   EUS N/A 07/29/2022   Procedure: UPPER ENDOSCOPIC ULTRASOUND (EUS) LINEAR;  Surgeon: Lemar Lofty., MD;  Location: [facility];  Service: Gastroenterology;   Laterality: N/A;   FINE NEEDLE ASPIRATION N/A 07/29/2022   Procedure: FINE NEEDLE ASPIRATION (FNA) LINEAR;  Surgeon: Lemar Lofty., MD;  Location: [facility];  Service: Gastroenterology;  Laterality: N/A;   LAPAROSCOPIC APPENDECTOMY  12/03/2011   Procedure: APPENDECTOMY LAPAROSCOPIC;  Surgeon: Lodema Pilot, DO;  Location: [facility];  Service: General;  Laterality: N/A;  drainage of intra-abdominal abscess    PARS PLANA VITRECTOMY Left 08/17/2012   Procedure: PARS PLANA VITRECTOMY WITH 25 GAUGE;  Surgeon: Sherrie George, MD;  Location: [facility];  Service: Ophthalmology;  Laterality: Left;   POLYPECTOMY  07/29/2022   Procedure: POLYPECTOMY;  Surgeon: Mansouraty, Netty Starring., MD;  Location: [facility];  Service: Gastroenterology;;   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   Procedure: REPAIR OF COMPLEX TRACTION RETINAL DETACHMENT;  Surgeon: Sherrie George, MD;  Location: [facility] OR;  Service: Ophthalmology;  Laterality: Left;   TUBAL LIGATION      Family History  Problem Relation Age of Onset   Breast cancer Mother 18   Diabetes Mother    Hypertension Mother    Kidney failure Father    Heart disease Father        No details   Diabetes Sister    Hypertension Sister    Hyperlipidemia Sister    Diabetes Maternal Uncle    Cancer Maternal Uncle        type unknown   Diabetes Paternal Aunt    Hypertension Paternal Aunt    Breast cancer Paternal Aunt    Fibromyalgia Paternal Aunt    Breast cancer Maternal Grandmother    Hypertension Maternal Grandmother    Kidney disease Maternal Grandfather    Cancer Maternal Grandfather        type unknown   Diabetes Paternal Grandmother    Diabetes Paternal Grandfather    Diabetes Daughter    Colon cancer Neg Hx    Esophageal cancer Neg Hx    Inflammatory bowel disease Neg Hx    Liver disease Neg Hx    Pancreatic cancer Neg Hx    Rectal cancer Neg Hx    Stomach cancer Neg Hx     Social History Reviewed with no  changes to be made today.   Outpatient Medications Prior to Visit  Medication Sig Dispense Refill   aspirin EC 81 MG tablet Take by mouth.     blood glucose meter kit and supplies KIT Use up to 2 times daily as directed. 1 each 0   Blood Pressure Monitor DEVI Please provide patient with insurance approved blood pressure monitor 1 each 0   Cholecalciferol (VITAMIN D-3) 125 MCG (5000 UT) TABS Take 1 tablet by mouth daily. 90 tablet 3   Continuous Glucose Sensor (DEXCOM G7 SENSOR) MISC Use to check blood sugar continuously throughout the day. E11.9 9 each 2   cyclobenzaprine (FLEXERIL) 10 MG tablet TAKE 1 TABLET (10 MG TOTAL) BY MOUTH 3 (THREE) TIMES DAILY AS NEEDED FOR MUSCLE SPASMS. 90 tablet 2   diclofenac Sodium (VOLTAREN)  1 % GEL APPLY 4 G TOPICALLY 4 (FOUR) TIMES DAILY. (Patient taking differently: Apply 4 g topically daily as needed (Muscle spasm).) 300 g 2   fenofibrate (TRICOR) 145 MG tablet Take 1 tablet (145 mg total) by mouth daily. 90 tablet 1   gabapentin (NEURONTIN) 300 MG capsule Take 1 capsule (300 mg total) by mouth at bedtime. 270 capsule 2   glipiZIDE (GLUCOTROL) 10 MG tablet Take 2 tablets (20 mg total) by mouth 2 (two) times daily before a meal. 360 tablet 2   glucose blood (TRUE METRIX BLOOD GLUCOSE TEST) test strip Testing twice daily. 200 each 1   hydrochlorothiazide (HYDRODIURIL) 25 MG tablet Take 1 tablet (25 mg total) by mouth daily. 90 tablet 1   insulin glargine (LANTUS SOLOSTAR) 100 UNIT/ML Solostar Pen Inject 40 Units into the skin 2 (two) times daily. 75 mL 4   Insulin Pen Needle (B-D UF III MINI PEN NEEDLES) 31G X 5 MM MISC Inject 1 each as directed in the morning and at bedtime. 200 each 3   Lancets (ONETOUCH DELICA PLUS LANCET33G) MISC Testing twice daily 200 each 1   linaclotide (LINZESS) 145 MCG CAPS capsule Take 1 capsule (145 mcg total) by mouth daily before breakfast. 90 capsule 1   lisinopril (ZESTRIL) 40 MG tablet Take 1 tablet (40 mg total) by mouth daily.  90 tablet 1   methimazole (TAPAZOLE) 5 MG tablet Take 1 tablet (5 mg total) by mouth 2 (two) times daily. 180 tablet 3   Multiple Vitamin (MULTI-VITAMIN) tablet Take 1 tablet by mouth daily.     pantoprazole (PROTONIX) 40 MG tablet Take 1 tablet (40 mg total) by mouth 2 (two) times daily. 180 tablet 0   rosuvastatin (CRESTOR) 5 MG tablet Take 1 tablet (5 mg total) by mouth daily. 90 tablet 3   vitamin C (ASCORBIC ACID) 500 MG tablet Take 1,000 mg by mouth daily.     benzonatate (TESSALON) 100 MG capsule Take 1-2 capsules (100-200 mg total) by mouth 2 (two) times daily as needed for cough. (Patient not taking: Reported on 05/16/2023) 30 capsule 0   Dulaglutide (TRULICITY) 0.75 MG/0.5ML SOAJ Inject 0.75 mg into the skin once a week. (Patient not taking: Reported on 05/16/2023) 6 mL 3   mupirocin ointment (BACTROBAN) 2 % Apply 1 Application topically daily. (Patient not taking: Reported on 05/16/2023) 22 g 2   No facility-administered medications prior to visit.    Allergies  Allergen Reactions   Latex Other (See Comments)    Infection in left middle finger from latex gloves due to moisture.   Augmentin [Amoxicillin-Pot Clavulanate] Itching    Burning and itching, back pains, elevated sugar levels   Iohexol      Code: RASH, Desc: PATIENT CALLED 06/17/08-STATED AFTER 6 HOURS POST IV CONTRAST, HANDS,SCALP AND GROIN AREA DEVELOPED ITCHING,BURNING SENSATION, Onset Date: [id number]        Objective:    BP (!) 165/73 (BP Location: Left Arm, Patient Position: Sitting, Cuff Size: Large)   Pulse 74   Ht 5\' 10"  (1.778 m)   Wt (!) 310 lb 6.4 oz (140.8 kg)   LMP 08/09/2012   SpO2 100%   BMI 44.54 kg/m  Wt Readings from Last 3 Encounters:  05/16/23 (!) 310 lb 6.4 oz (140.8 kg)  04/18/23 (!) 308 lb (139.7 kg)  03/01/23 (!) 306 lb 9.6 oz (139.1 kg)    Physical Exam Vitals and nursing note reviewed.  Constitutional:      Appearance: She is well-developed.  HENT:     Head: Normocephalic and  atraumatic.  Cardiovascular:     Rate and Rhythm: Normal rate and regular rhythm.     Heart sounds: Normal heart sounds. No murmur heard.    No friction rub. No gallop.  Pulmonary:     Effort: Pulmonary effort is normal. No tachypnea or respiratory distress.     Breath sounds: Normal breath sounds. No decreased breath sounds, wheezing, rhonchi or rales.  Chest:     Chest wall: No tenderness.  Abdominal:     General: Bowel sounds are normal.     Palpations: Abdomen is soft.  Musculoskeletal:        General: Normal range of motion.     Cervical back: Normal range of motion.  Skin:    General: Skin is warm and dry.  Neurological:     Mental Status: She is alert and oriented to person, place, and time.     Coordination: Coordination normal.  Psychiatric:        Behavior: Behavior normal. Behavior is cooperative.        Thought Content: Thought content normal.        Judgment: Judgment normal.          Patient has been counseled extensively about nutrition and exercise as well as the importance of adherence with medications and regular follow-up. The patient was given clear instructions to go to ER or return to medical center if symptoms don't improve, worsen or new problems develop. The patient verbalized understanding.   Follow-up: Return in about 4 weeks (around 06/13/2023) for a1c.   Claiborne Rigg, FNP-BC [facility] [place], [place] [phone]   05/16/2023, 2:54 PM

## 2023-05-16 NOTE — Progress Notes (Signed)
Dry cough on going for a month.  SOB stated 4 days ago. Swelling in feet. Back spasms on left side moving towards her back.

## 2023-05-16 NOTE — Telephone Encounter (Signed)
    Chief Complaint: Cough with back pain at ribs and SOB. Symptoms: Above Frequency: Last week Pertinent Negatives: Patient denies  Disposition: [] ED /[] Urgent Care (no appt availability in office) / [x] Appointment(In office/virtual)/ []  [facility]/ [] Home Care/ [] Refused Recommended Disposition /[] [facility]/ []  Follow-up with PCP Additional Notes: Pt. Agrees with appointment. Given cough medication, but has taken all of it.  Reason for Disposition  [1] SEVERE back pain (e.g., excruciating, unable to do any normal activities) AND [2] not improved 2 hours after pain medicine  Answer Assessment - Initial Assessment Questions 1. ONSET: "When did the pain begin?"      Last week 2. LOCATION: "Where does it hurt?" (upper, mid or lower back)     Back at ribs 3. SEVERITY: "How bad is the pain?"  (e.g., Scale 1-10; mild, moderate, or severe)   - MILD (1-3): Doesn't interfere with normal activities.    - MODERATE (4-7): Interferes with normal activities or awakens from sleep.    - SEVERE (8-10): Excruciating pain, unable to do any normal activities.      Moderate - sharp pain 4. PATTERN: "Is the pain constant?" (e.g., yes, no; constant, intermittent)      Comes and goes 5. RADIATION: "Does the pain shoot into your legs or somewhere else?"     No 6. CAUSE:  "What do you think is causing the back pain?"      Coughing 7. BACK OVERUSE:  "Any recent lifting of heavy objects, strenuous work or exercise?"     No 8. MEDICINES: "What have you taken so far for the pain?" (e.g., nothing, acetaminophen, NSAIDS)     OTC 9. NEUROLOGIC SYMPTOMS: "Do you have any weakness, numbness, or problems with bowel/bladder control?"     No 10. OTHER SYMPTOMS: "Do you have any other symptoms?" (e.g., fever, abdomen pain, burning with urination, blood in urine)       Cough and SOB 11. PREGNANCY: "Is there any chance you are pregnant?" "When was your last menstrual period?"        No  Protocols used: Back Pain-A-AH

## 2023-05-17 ENCOUNTER — Other Ambulatory Visit: Payer: Self-pay

## 2023-05-17 LAB — CBC WITH DIFFERENTIAL/PLATELET
Basophils Absolute: 0.1 10*3/uL (ref 0.0–0.2)
Basos: 1 %
EOS (ABSOLUTE): 0.2 10*3/uL (ref 0.0–0.4)
Eos: 2 %
Hematocrit: 36.9 % (ref 34.0–46.6)
Hemoglobin: 11.4 g/dL (ref 11.1–15.9)
Immature Grans (Abs): 0 10*3/uL (ref 0.0–0.1)
Immature Granulocytes: 0 %
Lymphocytes Absolute: 2 10*3/uL (ref 0.7–3.1)
Lymphs: 28 %
MCH: 26.9 pg (ref 26.6–33.0)
MCHC: 30.9 g/dL — ABNORMAL LOW (ref 31.5–35.7)
MCV: 87 fL (ref 79–97)
Monocytes Absolute: 0.4 10*3/uL (ref 0.1–0.9)
Monocytes: 6 %
Neutrophils Absolute: 4.5 10*3/uL (ref 1.4–7.0)
Neutrophils: 63 %
Platelets: 357 10*3/uL (ref 150–450)
RBC: 4.24 x10E6/uL (ref 3.77–5.28)
RDW: 13.3 % (ref 11.7–15.4)
WBC: 7.1 10*3/uL (ref 3.4–10.8)

## 2023-05-18 ENCOUNTER — Other Ambulatory Visit: Payer: Self-pay

## 2023-05-20 ENCOUNTER — Encounter: Payer: Self-pay | Admitting: Nurse Practitioner

## 2023-05-23 ENCOUNTER — Other Ambulatory Visit: Payer: Self-pay

## 2023-05-23 NOTE — Telephone Encounter (Signed)
Daniel from [facility] is calling to check on the status of the order for equipment supplies--automatic BP monitor dial.  Has sent fax multiple times.  Please advise.

## 2023-05-23 NOTE — Telephone Encounter (Signed)
BP monitor sent in April to [facility].   [facility] - [name], [place] - [address] [place], [place] [address] Phone: [phone]  Fax: [phone] DEA #: [id number]

## 2023-05-24 ENCOUNTER — Other Ambulatory Visit: Payer: Self-pay

## 2023-05-24 ENCOUNTER — Ambulatory Visit: Payer: Medicaid Other | Admitting: Gastroenterology

## 2023-05-24 NOTE — Telephone Encounter (Signed)
Spoke with Alecia Lemming at [employer]. Updated insurance. They will call patient when ready.

## 2023-05-26 ENCOUNTER — Other Ambulatory Visit: Payer: Self-pay

## 2023-05-27 ENCOUNTER — Other Ambulatory Visit: Payer: Self-pay

## 2023-05-27 ENCOUNTER — Encounter: Payer: Self-pay | Admitting: Gastroenterology

## 2023-05-27 ENCOUNTER — Ambulatory Visit (INDEPENDENT_AMBULATORY_CARE_PROVIDER_SITE_OTHER): Payer: Medicaid Other | Admitting: Gastroenterology

## 2023-05-27 VITALS — BP 120/60 | Ht 70.0 in | Wt 316.4 lb

## 2023-05-27 DIAGNOSIS — R109 Unspecified abdominal pain: Secondary | ICD-10-CM

## 2023-05-27 DIAGNOSIS — K229 Disease of esophagus, unspecified: Secondary | ICD-10-CM

## 2023-05-27 DIAGNOSIS — K5909 Other constipation: Secondary | ICD-10-CM

## 2023-05-27 MED ORDER — HYOSCYAMINE SULFATE 0.125 MG PO TABS
0.1250 mg | ORAL_TABLET | Freq: Four times a day (QID) | ORAL | 0 refills | Status: DC | PRN
  Filled 2023-05-27: qty 30, 8d supply, fill #0

## 2023-05-27 MED ORDER — LINACLOTIDE 290 MCG PO CAPS
290.0000 ug | ORAL_CAPSULE | Freq: Every day | ORAL | 0 refills | Status: DC
  Filled 2023-05-27: qty 90, 90d supply, fill #0

## 2023-05-27 NOTE — Patient Instructions (Signed)
We have sent the following medications to your pharmacy for you to pick up at your convenience: Linzess , Levsin   Increase your Linzess to 290 mcg once daily.  You will be contacted to schedule your EUS for Feb or March 2025 when hospital schedule is available.  _______________________________________________________  If your blood pressure at your visit was 140/90 or greater, please contact your primary care physician to follow up on this.  _______________________________________________________  If you are age 60 or older, your body mass index should be between 23-30. Your Body mass index is 45.4 kg/m. If this is out of the aforementioned range listed, please consider follow up with your Primary Care Provider.  If you are age 65 or younger, your body mass index should be between 19-25. Your Body mass index is 45.4 kg/m. If this is out of the aformentioned range listed, please consider follow up with your Primary Care Provider.   ________________________________________________________  The [facility] providers would like to encourage you to use [facility] to communicate with providers for non-urgent requests or questions.  Due to long hold times on the telephone, sending your provider a message by [facility] may be a faster and more efficient way to get a response.  Please allow 48 business hours for a response.  Please remember that this is for non-urgent requests.  _______________________________________________________  Thank you for choosing me and [facility].  Dr. Meridee Score

## 2023-05-27 NOTE — Progress Notes (Unsigned)
GASTROENTEROLOGY OUTPATIENT CLINIC VISIT   Primary Care Provider Claiborne Rigg, NP [address] [place] [address] [place] [address] [phone]   Patient Profile: Kristina Adams is a 57 y.o. 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 pylori, colon polyps (TA's), chronic constipation.  The patient presents to the [facility] for an evaluation and management of problem(s) noted below:  Problem List No diagnosis found.   History of Present Illness Please see prior GI notes for full details of HPI.  Interval History The patient returns for follow-up.  She states that when she was using the Linzess samples, she noted a significant improvement in her constipation as well as left upper quadrant discomfort and abdominal bloating.  She would like to have an attempt at treatment with Linzess and thinks that she will be able to afford this.  Her H. pylori Diatherix testing returned negative earlier this year showing that she had complete eradication.  Her GERD symptoms have been increasing somewhat.  No overt dysphagia symptoms however.  GI Review of Systems Positive as above Negative for dysphagia, odynophagia, nausea, vomiting, melena, hematochezia  Review of Systems General: Denies fevers/chills/weight loss unintentionally Cardiovascular: Denies chest pain Pulmonary: Denies shortness of breath Gastroenterological: See HPI Genitourinary: Denies darkened urine  Hematological: Denies easy bruising/bleeding Endocrine: Denies temperature intolerance Dermatological: Denies jaundice Psychological: Mood is stable   Medications Current Outpatient Medications  Medication Sig Dispense Refill   albuterol (VENTOLIN HFA) 108 (90 Base) MCG/ACT inhaler Inhale 2 puffs into the  lungs every 6 (six) hours as needed for wheezing or shortness of breath. 18 g 0   aspirin EC 81 MG tablet Take by mouth.     benzonatate (TESSALON) 100 MG capsule Take 1-2 capsules (100-200 mg total) by mouth 2 (two) times daily as needed for cough. (Patient not taking: Reported on 05/16/2023) 30 capsule 0   blood glucose meter kit and supplies KIT Use up to 2 times daily as directed. 1 each 0   Blood Pressure Monitor DEVI Please provide patient with insurance approved blood pressure monitor 1 each 0   Cholecalciferol (VITAMIN D-3) 125 MCG (5000 UT) TABS Take 1 tablet by mouth daily. 90 tablet 3   Continuous Glucose Sensor (DEXCOM G7 SENSOR) MISC Use to check blood sugar continuously throughout the day. E11.9 9 each 2   cyclobenzaprine (FLEXERIL) 10 MG tablet TAKE 1 TABLET (10 MG TOTAL) BY MOUTH 3 (THREE) TIMES DAILY AS NEEDED FOR MUSCLE SPASMS. 90 tablet 2   diclofenac Sodium (VOLTAREN) 1 % GEL APPLY 4 G TOPICALLY 4 (FOUR) TIMES DAILY. (Patient taking differently: Apply 4 g topically daily as needed (Muscle spasm).) 300 g 2   Dulaglutide (TRULICITY) 0.75 MG/0.5ML SOAJ Inject 0.75 mg into the skin once a week. (Patient not taking: Reported on 05/16/2023) 6 mL 3   fenofibrate (TRICOR) 145 MG tablet Take 1 tablet (145 mg total) by mouth daily. 90 tablet 1   gabapentin (NEURONTIN) 300 MG capsule Take 1 capsule (300 mg total) by mouth at bedtime. 270 capsule 2   glipiZIDE (GLUCOTROL) 10 MG tablet Take 2 tablets (20 mg total) by mouth 2 (two) times daily before a meal. 360 tablet 2   glucose blood (TRUE METRIX BLOOD GLUCOSE TEST) test strip Testing twice daily. 200 each 1   hydrochlorothiazide (HYDRODIURIL) 25 MG tablet Take 1 tablet (25  mg total) by mouth daily. 90 tablet 1   insulin glargine (LANTUS SOLOSTAR) 100 UNIT/ML Solostar Pen Inject 40 Units into the skin 2 (two) times daily. 75 mL 4   Insulin Pen Needle (B-D UF III MINI PEN NEEDLES) 31G X 5 MM MISC Inject 1 each as directed in the morning and at  bedtime. 200 each 3   Lancets (ONETOUCH DELICA PLUS LANCET33G) MISC Testing twice daily 200 each 1   linaclotide (LINZESS) 145 MCG CAPS capsule Take 1 capsule (145 mcg total) by mouth daily before breakfast. 90 capsule 1   lisinopril (ZESTRIL) 40 MG tablet Take 1 tablet (40 mg total) by mouth daily. 90 tablet 1   methimazole (TAPAZOLE) 5 MG tablet Take 1 tablet (5 mg total) by mouth 2 (two) times daily. 180 tablet 3   Multiple Vitamin (MULTI-VITAMIN) tablet Take 1 tablet by mouth daily.     mupirocin ointment (BACTROBAN) 2 % Apply 1 Application topically daily. (Patient not taking: Reported on 05/16/2023) 22 g 2   pantoprazole (PROTONIX) 40 MG tablet Take 1 tablet (40 mg total) by mouth 2 (two) times daily. 180 tablet 0   rosuvastatin (CRESTOR) 5 MG tablet Take 1 tablet (5 mg total) by mouth daily. 90 tablet 3   vitamin C (ASCORBIC ACID) 500 MG tablet Take 1,000 mg by mouth daily.     No current facility-administered medications for this visit.    Allergies Allergies  Allergen Reactions   Latex Other (See Comments)    Infection in left middle finger from latex gloves due to moisture.   Augmentin [Amoxicillin-Pot Clavulanate] Itching    Burning and itching, back pains, elevated sugar levels   Iohexol      Code: RASH, Desc: PATIENT CALLED 06/17/08-STATED AFTER 6 HOURS POST IV CONTRAST, HANDS,SCALP AND GROIN AREA DEVELOPED ITCHING,BURNING SENSATION, Onset Date: [id number]     Histories 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/03/2011   GERD (gastroesophageal reflux disease)    H/O hiatal hernia    HLD (hyperlipidemia)    Hypertension 12/03/2011   Hyperthyroidism    IBS (irritable bowel syndrome)    Morbid obesity with BMI of 45.0-49.9, adult (HCC)  12/03/2011   Sleep apnea 12/03/2011   last sleep study May 2013   Past Surgical History:  Procedure Laterality Date   BIOPSY  07/29/2022   Procedure: BIOPSY;  Surgeon: Lemar Lofty., MD;  Location: [facility];  Service: Gastroenterology;;   CESAREAN SECTION     x 2   COLONOSCOPY WITH PROPOFOL N/A 07/29/2022   Procedure: COLONOSCOPY WITH PROPOFOL;  Surgeon: Lemar Lofty., MD;  Location: [facility];  Service: Gastroenterology;  Laterality: N/A;   ESOPHAGOGASTRODUODENOSCOPY (EGD) WITH PROPOFOL N/A 07/29/2022   Procedure: ESOPHAGOGASTRODUODENOSCOPY (EGD) WITH PROPOFOL;  Surgeon: Meridee Score Netty Starring., MD;  Location: [facility];  Service: Gastroenterology;  Laterality: N/A;   EUS N/A 07/29/2022   Procedure: UPPER ENDOSCOPIC ULTRASOUND (EUS) LINEAR;  Surgeon: Lemar Lofty., MD;  Location: [facility];  Service: Gastroenterology;  Laterality: N/A;   FINE NEEDLE ASPIRATION N/A 07/29/2022   Procedure: FINE NEEDLE ASPIRATION (FNA) LINEAR;  Surgeon: Lemar Lofty., MD;  Location: [facility];  Service: Gastroenterology;  Laterality: N/A;   LAPAROSCOPIC APPENDECTOMY  12/03/2011  Procedure: APPENDECTOMY LAPAROSCOPIC;  Surgeon: Lodema Pilot, DO;  Location: [facility];  Service: General;  Laterality: N/A;  drainage of intra-abdominal abscess    PARS PLANA VITRECTOMY Left 08/17/2012   Procedure: PARS PLANA VITRECTOMY WITH 25 GAUGE;  Surgeon: Sherrie George, MD;  Location: [facility] OR;  Service: Ophthalmology;  Laterality: Left;   POLYPECTOMY  07/29/2022   Procedure: POLYPECTOMY;  Surgeon: Mansouraty, Netty Starring., MD;  Location: [facility];  Service: Gastroenterology;;   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   Procedure: REPAIR OF COMPLEX TRACTION RETINAL DETACHMENT;  Surgeon: Sherrie George, MD;  Location: [facility] OR;  Service: Ophthalmology;  Laterality: Left;   TUBAL LIGATION     Social History   Socioeconomic History   Marital status:  Divorced    Spouse name: Not on file   Number of children: 2   Years of education: Not on file   Highest education level: Not on file  Occupational History   Not on file  Tobacco Use   Smoking status: Some Days    Types: Cigars   Smokeless tobacco: Never  Vaping Use   Vaping status: Never Used  Substance and Sexual Activity   Alcohol use: Not Currently    Alcohol/week: 1.0 standard drink of alcohol    Types: 1 Glasses of wine per week    Comment: occasional   Drug use: No   Sexual activity: Yes    Birth control/protection: Pill  Other Topics Concern   Not on file  Social History Narrative   Lives alone.    Social Determinants of Health   Financial Resource Strain: Patient Declined (05/16/2023)   Overall Financial Resource Strain (CARDIA)    Difficulty of Paying Living Expenses: Patient declined  Food Insecurity: No Food Insecurity (05/16/2023)   Hunger Vital Sign    Worried About Running Out of Food in the Last Year: Never true    Ran Out of Food in the Last Year: Never true  Transportation Needs: No Transportation Needs (05/16/2023)   PRAPARE - Administrator, Civil Service (Medical): No    Lack of Transportation (Non-Medical): No  Physical Activity: Patient Declined (05/16/2023)   Exercise Vital Sign    Days of Exercise per Week: Patient declined    Minutes of Exercise per Session: Patient declined  Stress: No Stress Concern Present (05/16/2023)   [employer] of Occupational Health - Occupational Stress Questionnaire    Feeling of Stress : Not at all  Social Connections: Patient Declined (05/16/2023)   Social Connection and Isolation Panel [NHANES]    Frequency of Communication with Friends and Family: Patient declined    Frequency of Social Gatherings with Friends and Family: Patient declined    Attends Religious Services: Patient declined    Database administrator or Organizations: Patient declined    Attends Banker Meetings: Patient  declined    Marital Status: Patient declined  Intimate Partner Violence: Not At Risk (05/16/2023)   Humiliation, Afraid, Rape, and Kick questionnaire    Fear of Current or Ex-Partner: No    Emotionally Abused: No    Physically Abused: No    Sexually Abused: No   Family History  Problem Relation Age of Onset   Breast cancer Mother 68   Diabetes Mother    Hypertension Mother    Kidney failure Father    Heart disease Father        No details   Diabetes Sister    Hypertension Sister  Hyperlipidemia Sister    Diabetes Maternal Uncle    Cancer Maternal Uncle        type unknown   Diabetes Paternal Aunt    Hypertension Paternal Aunt    Breast cancer Paternal Aunt    Fibromyalgia Paternal Aunt    Breast cancer Maternal Grandmother    Hypertension Maternal Grandmother    Kidney disease Maternal Grandfather    Cancer Maternal Grandfather        type unknown   Diabetes Paternal Grandmother    Diabetes Paternal Grandfather    Diabetes Daughter    Colon cancer Neg Hx    Esophageal cancer Neg Hx    Inflammatory bowel disease Neg Hx    Liver disease Neg Hx    Pancreatic cancer Neg Hx    Rectal cancer Neg Hx    Stomach cancer Neg Hx    I have reviewed her medical, social, and family history in detail and updated the electronic medical record as necessary.    PHYSICAL EXAMINATION  LMP 08/09/2012  Wt Readings from Last 3 Encounters:  05/16/23 (!) 310 lb 6.4 oz (140.8 kg)  04/18/23 (!) 308 lb (139.7 kg)  03/01/23 (!) 306 lb 9.6 oz (139.1 kg)  GEN: NAD, appears stated age, doesn't appear chronically ill PSYCH: Cooperative, without pressured speech EYE: Conjunctivae pink, sclerae anicteric ENT: MMM CV: Nontachycardic RESP: No audible wheezing GI: NABS, soft, rounded, protuberant, obese, NT, no rebound MSK/EXT: Bilateral pedal edema present SKIN: No jaundice NEURO:  Alert & Oriented x 3, no focal deficits   REVIEW OF DATA  I reviewed the following data at the time of this  encounter:  GI Procedures and Studies  No new imaging studies to review  Laboratory Studies  Reviewed those in epic  Imaging Studies  No new imaging studies to review   ASSESSMENT  Ms. Longcor is a 57 y.o. 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 pylori, colon polyps (TA's), chronic constipation.  The patient is seen today for evaluation and management of:  No diagnosis found.  The patient is hemodynamically stable.  Clinically she did note an improvement after being on Linzess therapy.  Will get a try to see if Linzess 145 mcg may be the right dose for her versus the 72 mcg.  She will let us know within the next 10 days and then we can send in a prescription.  I think this will help her symptoms of abdominal discomfort and bloating even further.  Can consider SIBO breath testing in the future though treating her constipation seems to be the reason for issues.  In regards to follow-up of her subepithelial lesion we continue to plan a 1 year follow-up EUS from the time of her last EUS.  I defer any further management to her primary care provider in regards to the mediastinal mass/goiter, she was previously referred to cardiothoracic surgery but did not follow-up with them.  When/if she is ready for follow-up with them, are primary care team can work as needed in the future.  All patient questions were answered to the best of my ability, and the patient agrees to the aforementioned plan of action with follow-up as indicated.   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   New Prescriptions   No medications on file   Modified Medications   No medications on file    Planned Follow Up No follow-ups on file.   Total Time in Face-to-Face and in Coordination of Care for patient including independent/personal interpretation/review of prior testing, medical history, examination, medication adjustment, communicating results with the patient directly, and documentation within the EHR is 25 minutes.   Corliss Parish, MD  Gastroenterology Advanced [facility]oscopy Office # [phone]

## 2023-05-30 ENCOUNTER — Encounter: Payer: Self-pay | Admitting: Gastroenterology

## 2023-05-30 ENCOUNTER — Other Ambulatory Visit: Payer: Self-pay

## 2023-05-30 ENCOUNTER — Other Ambulatory Visit: Payer: Self-pay | Admitting: Nurse Practitioner

## 2023-05-30 ENCOUNTER — Telehealth: Payer: Self-pay

## 2023-05-30 DIAGNOSIS — K219 Gastro-esophageal reflux disease without esophagitis: Secondary | ICD-10-CM

## 2023-05-30 NOTE — Telephone Encounter (Signed)
Copied from CRM [id number]. Topic: General - Other >> May 30, 2023  1:22 PM Everette C wrote: Reason for CRM: Rhonda with [facility] has called to follow up on a request for a physicians order that was originally submitted to the practice on 05/13/23 and 05/23/23   Bjorn Loser would like to confirm receipt of paperwork as well as discuss an estimated turnaround time   Please contact further when possible

## 2023-05-30 NOTE — Telephone Encounter (Signed)
Forms have bee faxed. 05/30/23

## 2023-05-31 ENCOUNTER — Telehealth: Payer: Self-pay

## 2023-05-31 ENCOUNTER — Other Ambulatory Visit: Payer: Self-pay

## 2023-05-31 ENCOUNTER — Other Ambulatory Visit (HOSPITAL_COMMUNITY): Payer: Self-pay

## 2023-05-31 MED ORDER — PANTOPRAZOLE SODIUM 40 MG PO TBEC
40.0000 mg | DELAYED_RELEASE_TABLET | Freq: Two times a day (BID) | ORAL | 0 refills | Status: DC
  Filled 2023-05-31: qty 180, 90d supply, fill #0

## 2023-05-31 NOTE — Telephone Encounter (Signed)
Requested Prescriptions  Pending Prescriptions Disp Refills   pantoprazole (PROTONIX) 40 MG tablet 180 tablet 0    Sig: Take 1 tablet (40 mg total) by mouth 2 (two) times daily.     Gastroenterology: Proton Pump Inhibitors Passed - 05/30/2023 11:35 AM      Passed - Valid encounter within last 12 months    Recent Outpatient Visits           2 weeks ago Bronchitis   [facility] [place]. [facility] Claiborne Rigg, NP   3 months ago Encounter for annual physical exam   [facility] [place] [facility] [place]. [facility] [place], [place] W, NP   7 months ago Type 2 diabetes mellitus with hyperglycemia, with long-term current use of insulin [facility])   [facility] [place]. [facility] [place], [place] W, NP   12 months ago Type 2 diabetes mellitus with hyperglycemia, with long-term current use of insulin (HCC)   [facility] - [facility] Dept Of [place]. [facility] [place], [place] W, NP   1 year ago Type 2 diabetes mellitus with hyperglycemia, with long-term current use of insulin (HCC)   [facility] - [facility] Dept Of [employer]. [facility] Claiborne Rigg, NP       Future Appointments             In 1 week Claiborne Rigg, NP [facility] - A Dept Of [place]igh[facility]. [facility]

## 2023-05-31 NOTE — Telephone Encounter (Signed)
Pharmacy Patient Advocate Encounter   Received notification from CoverMyMeds that prior authorization for Trulicity 0.75MG /0.5ML auto-injectors is required/requested.   Insurance verification completed.   The patient is insured through [facility] .   Per test claim: PA required; PA started via CoverMyMeds. KEY [id number] . Waiting for clinical questions to populate.

## 2023-06-01 ENCOUNTER — Other Ambulatory Visit: Payer: Self-pay

## 2023-06-01 ENCOUNTER — Encounter: Payer: Self-pay | Admitting: Podiatry

## 2023-06-01 ENCOUNTER — Ambulatory Visit: Payer: Medicaid Other | Admitting: Gastroenterology

## 2023-06-01 ENCOUNTER — Ambulatory Visit (INDEPENDENT_AMBULATORY_CARE_PROVIDER_SITE_OTHER): Payer: Medicaid Other | Admitting: Podiatry

## 2023-06-01 DIAGNOSIS — M79674 Pain in right toe(s): Secondary | ICD-10-CM | POA: Diagnosis not present

## 2023-06-01 DIAGNOSIS — L84 Corns and callosities: Secondary | ICD-10-CM

## 2023-06-01 DIAGNOSIS — Z8631 Personal history of diabetic foot ulcer: Secondary | ICD-10-CM

## 2023-06-01 DIAGNOSIS — M79675 Pain in left toe(s): Secondary | ICD-10-CM

## 2023-06-01 DIAGNOSIS — E1142 Type 2 diabetes mellitus with diabetic polyneuropathy: Secondary | ICD-10-CM

## 2023-06-01 DIAGNOSIS — B351 Tinea unguium: Secondary | ICD-10-CM

## 2023-06-01 NOTE — Telephone Encounter (Signed)
Pharmacy Patient Advocate Encounter  Questions generated, answered, and submitted

## 2023-06-02 ENCOUNTER — Other Ambulatory Visit: Payer: Self-pay

## 2023-06-02 NOTE — Progress Notes (Signed)
Subjective:  Patient ID: Kristina Adams, female    DOB: [birth year],  MRN: [id number]  57 y.o. female presents to clinic with  at risk foot care. Patient has h/o diabetes with history of ulceration of left great toe and preulcerative lesion(s) of both feet and painful mycotic toenails that limit ambulation. Painful toenails interfere with ambulation. Aggravating factors include wearing enclosed shoe gear. Pain is relieved with periodic professional debridement. Painful preulcerative lesion(s) is/are aggravated when weightbearing with and without shoegear. Pain is relieved with periodic professional debridement.  Chief Complaint  Patient presents with   Diabetes    DFC BS - 72 A1C - 9.6    Callouses    BILAT   New problem(s): None   PCP is Claiborne Rigg, NP.  Allergies  Allergen Reactions   Latex Other (See Comments)    Infection in left middle finger from latex gloves due to moisture.   Augmentin [Amoxicillin-Pot Clavulanate] Itching    Burning and itching, back pains, elevated sugar levels   Iohexol      Code: RASH, Desc: PATIENT CALLED 06/17/08-STATED AFTER 6 HOURS POST IV CONTRAST, HANDS,SCALP AND GROIN AREA DEVELOPED ITCHING,BURNING SENSATION, Onset Date: [id number]     Review of Systems: Negative except as noted in the HPI.   Objective:  Kristina Adams is a pleasant 57 y.o. female morbidly obese in NAD. AAO x 3.  Vascular Examination: CFT <3 seconds b/l LE. Palpable DP pulse(s) b/l LE. Palpable PT pulse(s) b/l LE. Pedal hair sparse. No pain with calf compression b/l. Nonpitting edema noted bilateral ankles.  Dermatological Examination: Pedal integument with normal turgor, texture and tone b/l LE. No open wounds b/l. No interdigital macerations b/l. Toenails 1-5 b/l elongated, thickened, discolored with subungual debris. +Tenderness with dorsal palpation of nailplates.   Left great toe ulcer healed with postinflammatory hyperpigmentation. Mild hyperkeratosis medial and  lateral IPJ.   Preulcerative lesion(s) noted submet head 1 right foot and submet head 5 left foot and bilateral great toes.  Neurological Examination: Pt has subjective symptoms of neuropathy. Protective sensation intact 5/5 intact bilaterally with 10g monofilament b/l. Vibratory sensation intact b/l.  Musculoskeletal Examination: Normal muscle strength 5/5 to all lower extremity muscle groups bilaterally. Hammertoe(s) noted to the bilateral 5th toes.. No pain, crepitus or joint limitation noted with ROM b/l LE.  Patient ambulates independently without assistive aids.  Radiographs: None  Last A1c:      Latest Ref Rng & Units 03/01/2023   11:49 AM 10/20/2022    4:01 PM  Hemoglobin A1C  Hemoglobin-A1c 4.8 - 5.6 % 9.4  10.1     Assessment:   1. Pain due to onychomycosis of toenails of both feet   2. Pre-ulcerative calluses   3. History of diabetic ulcer of foot   4. Type 2 diabetes mellitus with polyneuropathy (HCC)     Plan:  -Patient was evaluated today. All questions/concerns addressed on today's visit. -Patient doing well and compliant with shoe gear. Recommended Dearfoam Slippers for indoors. -Will submit preauthorization to insurance company for paring of preulcerative lesions as patient has h/o diabetic ulcer left great toe. -Patient to continue soft, supportive shoe gear daily. -Toenails 1-5 b/l were debrided in length and girth with sterile nail nippers and dremel without iatrogenic bleeding.  -As a courtesy, preulcerative lesion pared left great toe, right great toe, submet head 1 right foot, and submet head 5 left foot utilizing sterile scalpel blade. Total number pared=4. -Patient/POA to call should there be question/concern in the  interim.  Return in about 3 months (around 09/01/2023).  Freddie Breech, DPM      [place] LOCATION: [address] [place] [address]                   Office [phone]    [facility] LOCATION: [address] [place], [place] [address] Office [phone]

## 2023-06-03 ENCOUNTER — Ambulatory Visit: Payer: Self-pay | Admitting: Nurse Practitioner

## 2023-06-06 ENCOUNTER — Other Ambulatory Visit: Payer: Self-pay

## 2023-06-06 NOTE — Telephone Encounter (Signed)
Pharmacy Patient Advocate Encounter  Received notification from [facility] that Prior Authorization for Trulicity has been APPROVED from 06/01/23 to 05/31/2024   PA #/Case ID/Reference #: [id number]

## 2023-06-07 ENCOUNTER — Other Ambulatory Visit: Payer: Self-pay

## 2023-06-13 ENCOUNTER — Ambulatory Visit: Payer: Medicaid Other | Admitting: Nurse Practitioner

## 2023-06-13 ENCOUNTER — Other Ambulatory Visit: Payer: Self-pay

## 2023-06-14 ENCOUNTER — Other Ambulatory Visit: Payer: Self-pay

## 2023-06-17 ENCOUNTER — Other Ambulatory Visit: Payer: Self-pay

## 2023-06-17 DIAGNOSIS — Z8509 Personal history of malignant neoplasm of other digestive organs: Secondary | ICD-10-CM

## 2023-06-17 DIAGNOSIS — K229 Disease of esophagus, unspecified: Secondary | ICD-10-CM

## 2023-06-21 ENCOUNTER — Telehealth: Payer: Self-pay | Admitting: Nurse Practitioner

## 2023-06-21 NOTE — Telephone Encounter (Signed)
Tom with [facility], has called, in regards to a Physician Order for a scale that was faxed over to [phone] on 06/08/2023 for [employer]. Per Elijah Birk, they need this signed and dated and faxed back to fax number [phone]. Elijah Birk will re-fax the Physician Order again today, 06/21/2023.   [facility] callback #  [phone] (Customer Support Line)

## 2023-06-24 NOTE — Telephone Encounter (Signed)
Fax received, will fax when signed.

## 2023-06-27 ENCOUNTER — Inpatient Hospital Stay
Admission: RE | Admit: 2023-06-27 | Discharge: 2023-06-27 | Disposition: A | Discharge status: Home / Self Care | Payer: Medicaid Other | Source: Ambulatory Visit | Attending: Internal Medicine | Admitting: Internal Medicine

## 2023-06-28 NOTE — Telephone Encounter (Signed)
Tom with [facility] is calling in to check the status of a Physicians order they sent over. Please follow up with Tom.

## 2023-06-29 NOTE — Telephone Encounter (Signed)
Forms have been signed and faxed twice.

## 2023-07-25 ENCOUNTER — Encounter: Payer: Self-pay | Admitting: Hematology

## 2023-07-28 ENCOUNTER — Other Ambulatory Visit: Payer: Self-pay | Admitting: Gastroenterology

## 2023-07-29 ENCOUNTER — Other Ambulatory Visit: Payer: Self-pay

## 2023-08-01 ENCOUNTER — Other Ambulatory Visit: Payer: Self-pay

## 2023-08-01 ENCOUNTER — Encounter: Payer: Self-pay | Admitting: Hematology

## 2023-08-01 MED ORDER — HYOSCYAMINE SULFATE 0.125 MG PO TABS
0.1250 mg | ORAL_TABLET | Freq: Four times a day (QID) | ORAL | 1 refills | Status: AC | PRN
  Filled 2023-08-01: qty 60, 15d supply, fill #0

## 2023-08-03 ENCOUNTER — Other Ambulatory Visit: Payer: Self-pay

## 2023-08-03 ENCOUNTER — Ambulatory Visit: Payer: Medicare Other | Attending: Nurse Practitioner | Admitting: Nurse Practitioner

## 2023-08-03 ENCOUNTER — Encounter: Payer: Self-pay | Admitting: Hematology

## 2023-08-03 ENCOUNTER — Encounter: Payer: Self-pay | Admitting: Nurse Practitioner

## 2023-08-03 VITALS — BP 150/75 | HR 80 | Resp 20 | Ht 70.0 in | Wt 317.2 lb

## 2023-08-03 DIAGNOSIS — R1012 Left upper quadrant pain: Secondary | ICD-10-CM | POA: Insufficient documentation

## 2023-08-03 DIAGNOSIS — R0602 Shortness of breath: Secondary | ICD-10-CM | POA: Insufficient documentation

## 2023-08-03 DIAGNOSIS — M546 Pain in thoracic spine: Secondary | ICD-10-CM | POA: Insufficient documentation

## 2023-08-03 DIAGNOSIS — G8929 Other chronic pain: Secondary | ICD-10-CM | POA: Diagnosis not present

## 2023-08-03 DIAGNOSIS — M542 Cervicalgia: Secondary | ICD-10-CM | POA: Diagnosis not present

## 2023-08-03 NOTE — Progress Notes (Unsigned)
Assessment & Plan:  Kristina Adams was seen today for medical management of chronic issues and back pain.  Diagnoses and all orders for this visit:  Chronic left-sided thoracic back pain -     D-dimer, quantitative -     CBC with Differential  Abdominal pain, chronic, left upper quadrant -     DG Abd 2 Views; Future   Patient has been counseled on age-appropriate routine health concerns for screening and prevention. These are reviewed and up-to-date. Referrals have been placed accordingly. Immunizations are up-to-date or declined.    Subjective:   Chief Complaint  Patient presents with   Medical Management of Chronic Issues   Back Pain    Sharp pain on left side near rib cage which has gotten over the past 2 days. Sob    Kristina Adams 58 y.o. female presents to office today with complaints of left sided thoracic back pain    Notes "Twinge" in the left side of her back which radiates  to underneath the left breast and upper abdomen. She denies constipation and states linzess is effective. Twisting and burping gives her some relief.  Pain is described as sharp and aching. Also has soreness in her neck. When she presses on her back she feels a twinge. She also sleeps on this side constantly at night.     Review of Systems  Constitutional:  Negative for fever, malaise/fatigue and weight loss.  HENT: Negative.  Negative for nosebleeds.   Eyes: Negative.  Negative for blurred vision, double vision and photophobia.  Respiratory: Negative.  Negative for cough and shortness of breath.   Cardiovascular: Negative.  Negative for chest pain, palpitations and leg swelling.  Gastrointestinal:  Positive for abdominal pain. Negative for blood in stool, constipation, diarrhea, heartburn, melena, nausea and vomiting.  Musculoskeletal:  Positive for joint pain and myalgias.  Neurological: Negative.  Negative for dizziness, focal weakness, seizures and headaches.  Psychiatric/Behavioral: Negative.   Negative for suicidal ideas.     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/03/2011   GERD (gastroesophageal reflux disease)    H/O hiatal hernia    HLD (hyperlipidemia)    Hypertension 12/03/2011   Hyperthyroidism    IBS (irritable bowel syndrome)    Morbid obesity with BMI of 45.0-49.9, adult (HCC) 12/03/2011   Sleep apnea 12/03/2011   last sleep study May 2013    Past Surgical History:  Procedure Laterality Date   BIOPSY  07/29/2022   Procedure: BIOPSY;  Surgeon: Lemar Lofty., MD;  Location: [facility];  Service: Gastroenterology;;   CESAREAN SECTION     x 2   COLONOSCOPY WITH PROPOFOL N/A 07/29/2022   Procedure: COLONOSCOPY WITH PROPOFOL;  Surgeon: Lemar Lofty., MD;  Location: [facility];  Service: Gastroenterology;  Laterality: N/A;   ESOPHAGOGASTRODUODENOSCOPY (EGD) WITH PROPOFOL N/A 07/29/2022   Procedure: ESOPHAGOGASTRODUODENOSCOPY (EGD) WITH PROPOFOL;  Surgeon: Meridee Score Netty Starring., MD;  Location: [facility];  Service: Gastroenterology;  Laterality: N/A;   EUS N/A 07/29/2022   Procedure: UPPER ENDOSCOPIC ULTRASOUND (EUS) LINEAR;  Surgeon: Lemar Lofty., MD;  Location: [facility];  Service: Gastroenterology;  Laterality: N/A;   FINE NEEDLE ASPIRATION N/A 07/29/2022   Procedure: FINE NEEDLE ASPIRATION (FNA) LINEAR;  Surgeon: Lemar Lofty., MD;  Location: [facility];  Service: Gastroenterology;  Laterality: N/A;   LAPAROSCOPIC APPENDECTOMY  12/03/2011   Procedure: APPENDECTOMY LAPAROSCOPIC;  Surgeon: Lodema Pilot, DO;  Location: [facility];  Service: General;  Laterality: N/A;  drainage of intra-abdominal abscess    PARS PLANA VITRECTOMY Left 08/17/2012   Procedure: PARS PLANA VITRECTOMY WITH 25  GAUGE;  Surgeon: Sherrie George, MD;  Location: [facility];  Service: Ophthalmology;  Laterality: Left;   POLYPECTOMY  07/29/2022   Procedure: POLYPECTOMY;  Surgeon: Mansouraty, Netty Starring., MD;  Location: [facility];  Service: Gastroenterology;;   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   Procedure: REPAIR OF COMPLEX TRACTION RETINAL DETACHMENT;  Surgeon: Sherrie George, MD;  Location: [facility] OR;  Service: Ophthalmology;  Laterality: Left;   TUBAL LIGATION      Family History  Problem Relation Age of Onset   Breast cancer Mother 19   Diabetes Mother    Hypertension Mother    Kidney failure Father    Heart disease Father        No details   Diabetes Sister    Hypertension Sister    Hyperlipidemia Sister    Diabetes Maternal Uncle    Cancer Maternal Uncle        type unknown   Diabetes Paternal Aunt    Hypertension Paternal Aunt    Breast cancer Paternal Aunt    Fibromyalgia Paternal Aunt    Breast cancer Maternal Grandmother    Hypertension Maternal Grandmother    Kidney disease Maternal Grandfather    Cancer Maternal Grandfather        type unknown   Diabetes Paternal Grandmother    Diabetes Paternal Grandfather    Diabetes Daughter    Colon cancer Neg Hx    Esophageal cancer Neg Hx    Inflammatory bowel disease Neg Hx    Liver disease Neg Hx    Pancreatic cancer Neg Hx    Rectal cancer Neg Hx    Stomach cancer Neg Hx     Social History Reviewed with no changes to be made today.   Outpatient Medications Prior to Visit  Medication Sig Dispense Refill   albuterol (VENTOLIN HFA) 108 (90 Base) MCG/ACT inhaler Inhale 2 puffs into the lungs every 6 (six) hours as needed for wheezing or shortness of breath. 18 g 0   aspirin EC 81 MG tablet Take by mouth.     blood glucose meter kit and supplies KIT Use up to 2 times daily as directed. 1 each 0   Blood Pressure Monitor DEVI Please provide patient with insurance approved blood pressure monitor 1 each 0    Cholecalciferol (VITAMIN D-3) 125 MCG (5000 UT) TABS Take 1 tablet by mouth daily. 90 tablet 3   Continuous Glucose Sensor (DEXCOM G7 SENSOR) MISC Use to check blood sugar continuously throughout the day. E11.9 9 each 2   cyclobenzaprine (FLEXERIL) 10 MG tablet TAKE 1 TABLET (10 MG TOTAL) BY MOUTH 3 (THREE) TIMES DAILY AS NEEDED FOR MUSCLE SPASMS. 90 tablet 2   diclofenac Sodium (VOLTAREN) 1 % GEL APPLY 4 G TOPICALLY 4 (FOUR) TIMES DAILY. (Patient taking differently: Apply 4 g topically daily as needed (Muscle spasm).) 300 g 2   Dulaglutide (TRULICITY) 0.75 MG/0.5ML SOAJ Inject 0.75 mg into the skin once a week. 6 mL 3   fenofibrate (TRICOR) 145 MG tablet Take 1 tablet (145 mg total) by mouth daily. 90 tablet 1   gabapentin (NEURONTIN) 300  MG capsule Take 1 capsule (300 mg total) by mouth at bedtime. 270 capsule 2   glipiZIDE (GLUCOTROL) 10 MG tablet Take 2 tablets (20 mg total) by mouth 2 (two) times daily before a meal. 360 tablet 2   glucose blood (TRUE METRIX BLOOD GLUCOSE TEST) test strip Testing twice daily. 200 each 1   hydrochlorothiazide (HYDRODIURIL) 25 MG tablet Take 1 tablet (25 mg total) by mouth daily. 90 tablet 1   hyoscyamine (LEVSIN) 0.125 MG tablet Take 1 tablet (0.125 mg total) by mouth every 6 (six) hours as needed for cramping (diarrhea,nausea). 60 tablet 1   insulin glargine (LANTUS SOLOSTAR) 100 UNIT/ML Solostar Pen Inject 40 Units into the skin 2 (two) times daily. 75 mL 4   Insulin Pen Needle (B-D UF III MINI PEN NEEDLES) 31G X 5 MM MISC Inject as directed in the morning and at bedtime. 200 each 3   Lancets (ONETOUCH DELICA PLUS LANCET33G) MISC Testing twice daily 200 each 1   linaclotide (LINZESS) 290 MCG CAPS capsule Take 1 capsule (290 mcg total) by mouth daily before breakfast. 90 capsule 0   lisinopril (ZESTRIL) 40 MG tablet Take 1 tablet (40 mg total) by mouth daily. 90 tablet 1   methimazole (TAPAZOLE) 5 MG tablet Take 1 tablet (5 mg total) by mouth 2 (two) times  daily. 180 tablet 3   Multiple Vitamin (MULTI-VITAMIN) tablet Take 1 tablet by mouth daily.     mupirocin ointment (BACTROBAN) 2 % Apply 1 Application topically daily. 22 g 2   pantoprazole (PROTONIX) 40 MG tablet Take 1 tablet (40 mg total) by mouth 2 (two) times daily. 180 tablet 0   rosuvastatin (CRESTOR) 5 MG tablet Take 1 tablet (5 mg total) by mouth daily. 90 tablet 3   vitamin C (ASCORBIC ACID) 500 MG tablet Take 1,000 mg by mouth daily.     No facility-administered medications prior to visit.    Allergies  Allergen Reactions   Latex Other (See Comments)    Infection in left middle finger from latex gloves due to moisture.   Augmentin [Amoxicillin-Pot Clavulanate] Itching    Burning and itching, back pains, elevated sugar levels   Iohexol      Code: RASH, Desc: PATIENT CALLED 06/17/08-STATED AFTER 6 HOURS POST IV CONTRAST, HANDS,SCALP AND GROIN AREA DEVELOPED ITCHING,BURNING SENSATION, Onset Date: [id number]        Objective:    BP (!) 150/75 (BP Location: Left Arm, Patient Position: Sitting, Cuff Size: Large)   Pulse 80   Resp 20   Ht 5\' 10"  (1.778 m)   Wt (!) 317 lb 3.2 oz (143.9 kg)   LMP 08/09/2012   SpO2 100%   BMI 45.51 kg/m  Wt Readings from Last 3 Encounters:  08/03/23 (!) 317 lb 3.2 oz (143.9 kg)  05/27/23 (!) 316 lb 6.4 oz (143.5 kg)  05/16/23 (!) 310 lb 6.4 oz (140.8 kg)    Physical Exam Vitals and nursing note reviewed.  Constitutional:      Appearance: She is well-developed.  HENT:     Head: Normocephalic and atraumatic.  Cardiovascular:     Rate and Rhythm: Normal rate and regular rhythm.     Heart sounds: Normal heart sounds. No murmur heard.    No friction rub. No gallop.  Pulmonary:     Effort: Pulmonary effort is normal. No tachypnea or respiratory distress.     Breath sounds: Normal breath sounds. No decreased breath sounds, wheezing, rhonchi or rales.  Chest:  Chest wall: No tenderness.  Abdominal:     General: Bowel sounds are normal.      Palpations: Abdomen is soft.  Musculoskeletal:        General: Normal range of motion.     Cervical back: Normal range of motion.     Thoracic back: Tenderness present.       Back:  Skin:    General: Skin is warm and dry.  Neurological:     Mental Status: She is alert and oriented to person, place, and time.     Coordination: Coordination normal.  Psychiatric:        Behavior: Behavior normal. Behavior is cooperative.        Thought Content: Thought content normal.        Judgment: Judgment normal.          Patient has been counseled extensively about nutrition and exercise as well as the importance of adherence with medications and regular follow-up. The patient was given clear instructions to go to ER or return to medical center if symptoms don't improve, worsen or new problems develop. The patient verbalized understanding.   Follow-up: Return in about 3 months (around 11/01/2023).   Claiborne Rigg, FNP-BC [facility] [place], [place] [phone]   08/04/2023, 4:13 PM

## 2023-08-04 ENCOUNTER — Other Ambulatory Visit: Payer: Self-pay

## 2023-08-04 ENCOUNTER — Encounter: Payer: Self-pay | Admitting: Nurse Practitioner

## 2023-08-04 ENCOUNTER — Encounter: Payer: Self-pay | Admitting: Hematology

## 2023-08-04 LAB — CBC WITH DIFFERENTIAL/PLATELET
Basophils Absolute: 0.1 10*3/uL (ref 0.0–0.2)
Basos: 2 %
EOS (ABSOLUTE): 0.2 10*3/uL (ref 0.0–0.4)
Eos: 4 %
Hematocrit: 34 % (ref 34.0–46.6)
Hemoglobin: 10.7 g/dL — ABNORMAL LOW (ref 11.1–15.9)
Immature Grans (Abs): 0 10*3/uL (ref 0.0–0.1)
Immature Granulocytes: 0 %
Lymphocytes Absolute: 2 10*3/uL (ref 0.7–3.1)
Lymphs: 33 %
MCH: 26.8 pg (ref 26.6–33.0)
MCHC: 31.5 g/dL (ref 31.5–35.7)
MCV: 85 fL (ref 79–97)
Monocytes Absolute: 0.4 10*3/uL (ref 0.1–0.9)
Monocytes: 7 %
Neutrophils Absolute: 3.3 10*3/uL (ref 1.4–7.0)
Neutrophils: 54 %
Platelets: 325 10*3/uL (ref 150–450)
RBC: 3.99 x10E6/uL (ref 3.77–5.28)
RDW: 13.4 % (ref 11.7–15.4)
WBC: 6 10*3/uL (ref 3.4–10.8)

## 2023-08-04 LAB — D-DIMER, QUANTITATIVE: D-DIMER: 0.33 mg{FEU}/L (ref 0.00–0.49)

## 2023-08-16 ENCOUNTER — Other Ambulatory Visit: Payer: Self-pay

## 2023-08-22 ENCOUNTER — Other Ambulatory Visit: Payer: Self-pay | Admitting: Nurse Practitioner

## 2023-08-22 ENCOUNTER — Ambulatory Visit: Payer: Self-pay | Admitting: Nurse Practitioner

## 2023-08-22 ENCOUNTER — Other Ambulatory Visit: Payer: Self-pay

## 2023-08-22 DIAGNOSIS — J069 Acute upper respiratory infection, unspecified: Secondary | ICD-10-CM

## 2023-08-22 MED ORDER — PREDNISONE 20 MG PO TABS
20.0000 mg | ORAL_TABLET | Freq: Every day | ORAL | 0 refills | Status: AC
  Filled 2023-08-22: qty 5, 5d supply, fill #0

## 2023-08-22 NOTE — Telephone Encounter (Signed)
 Copied from CRM [id number]. Topic: Clinical - Red Word Triage >> Aug 22, 2023  9:36 AM Zipporah Him wrote: Red Word that prompted transfer to Nurse Triage: Extreme cough, shortness of breath, wheezing when breathing, feels like she is gasping for air when she is trying to sleep. Was seen recently and not feeling any better.   Chief Complaint: Cough Symptoms: Cough, wheezing, shortness of breath  Frequency: Frequent  Pertinent Negatives: Patient denies fever  Disposition: [] ED /[x] Urgent Care (no appt availability in office) / [] Appointment(In office/virtual)/ []  [place] [facility]/ [] Home Care/ [x] Refused Recommended Disposition /[] [facility]/ []  Follow-up with PCP Additional Notes: Patient reports that for the last week she has been experiencing flu like symptoms. She states that she is still experiencing a cough with some wheezing and shortness of breath. She denies any fevers. Patient does not want to be seen and would like a prescription for something for her cough. Patient advised to call back for new or worsening symptoms.   Patient would like a call back with a response to her medication request.    Reason for Disposition  [1] MILD difficulty breathing (e.g., minimal/no SOB at rest, SOB with walking, pulse <100) AND [2] still present when not coughing  Answer Assessment - Initial Assessment Questions 1. ONSET: "When did the cough begin?"      Approximately 1 week 2. SEVERITY: "How bad is the cough today?"      Moderate  3. SPUTUM: "Describe the color of your sputum" (none, dry cough; clear, white, yellow, green)     Clear  4. HEMOPTYSIS: "Are you coughing up any blood?" If so ask: "How much?" (flecks, streaks, tablespoons, etc.)     No 5. DIFFICULTY BREATHING: "Are you having difficulty breathing?" If Yes, ask: "How bad is it?" (e.g., mild, moderate, severe)    - MILD: No SOB at rest, mild SOB with walking, speaks normally in sentences, can lie down, no retractions,  pulse < 100.    - MODERATE: SOB at rest, SOB with minimal exertion and prefers to sit, cannot lie down flat, speaks in phrases, mild retractions, audible wheezing, pulse 100-120.    - SEVERE: Very SOB at rest, speaks in single words, struggling to breathe, sitting hunched forward, retractions, pulse > 120      Moderate  6. FEVER: "Do you have a fever?" If Yes, ask: "What is your temperature, how was it measured, and when did it start?"     No 7. CARDIAC HISTORY: "Do you have any history of heart disease?" (e.g., heart attack, congestive heart failure)      No 8. LUNG HISTORY: "Do you have any history of lung disease?"  (e.g., pulmonary embolus, asthma, emphysema)     No 9. PE RISK FACTORS: "Do you have a history of blood clots?" (or: recent major surgery, recent prolonged travel, bedridden)     No 10. OTHER SYMPTOMS: "Do you have any other symptoms?" (e.g., runny nose, wheezing, chest pain)       Wheezing  11. PREGNANCY: "Is there any chance you are pregnant?" "When was your last menstrual period?"       No 12. TRAVEL: "Have you traveled out of the country in the last month?" (e.g., travel history, exposures)       No  Protocols used: Cough - Acute Non-Productive-A-AH

## 2023-08-22 NOTE — Telephone Encounter (Signed)
 Routing to PCP

## 2023-08-22 NOTE — Telephone Encounter (Signed)
 Can take coricidin HBP cough syrup over the counter. I have sent prednisone  for wheezing.

## 2023-08-23 ENCOUNTER — Ambulatory Visit (INDEPENDENT_AMBULATORY_CARE_PROVIDER_SITE_OTHER): Payer: Medicare Other | Admitting: Internal Medicine

## 2023-08-23 ENCOUNTER — Other Ambulatory Visit: Payer: Self-pay

## 2023-08-23 ENCOUNTER — Encounter: Payer: Self-pay | Admitting: Internal Medicine

## 2023-08-23 VITALS — BP 120/70 | HR 84 | Ht 70.0 in | Wt 302.6 lb

## 2023-08-23 DIAGNOSIS — Z794 Long term (current) use of insulin: Secondary | ICD-10-CM | POA: Diagnosis not present

## 2023-08-23 DIAGNOSIS — E1165 Type 2 diabetes mellitus with hyperglycemia: Secondary | ICD-10-CM

## 2023-08-23 DIAGNOSIS — E042 Nontoxic multinodular goiter: Secondary | ICD-10-CM

## 2023-08-23 DIAGNOSIS — E0590 Thyrotoxicosis, unspecified without thyrotoxic crisis or storm: Secondary | ICD-10-CM | POA: Diagnosis not present

## 2023-08-23 LAB — POCT GLYCOSYLATED HEMOGLOBIN (HGB A1C): Hemoglobin A1C: 9.1 % — AB (ref 4.0–5.6)

## 2023-08-23 NOTE — Progress Notes (Signed)
Name: Kristina Adams  MRN/ DOB: [id number], 08/20/1965   Age/ Sex: 58 y.o., female    PCP: Claiborne Rigg, NP   Reason for Endocrinology Evaluation: Type 2 Diabetes Mellitus     Date of Initial Endocrinology Visit: 04/18/2023    PATIENT IDENTIFIER: Kristina Adams is a 58 y.o. female with a past medical history of DM, HTN, IBS, GERD. The patient presented for initial endocrinology clinic visit on 04/18/2023 for consultative assistance with her diabetes management.    HPI: Kristina Adams was    Diagnosed with DM 1988 Prior Medications tried/Intolerance: Metformin - diarrhea . Jardiance- yeast infection . Ozempic -constipation                   Hemoglobin A1c has ranged from 7.9% in 2022, peaking at 10.1% in 2024.   On her initial visit to our clinic she had an A1c of 9.4%, she was on glipizide, Lantus.  She had Ozempic on her medication list but she was not taking it due to constipation.  I started on Mounjaro, increase glipizide and decrease Lantus  Insurance did not cover Mounjaro and was switched to Trulicity    THYROID HISTORY: Patient was diagnosed with multinodular goiter in 2007.She is s/p benign FNA of the right inferior nodule 3.1 cm , isthmic nodule 2.4 cm 08/18/2011 She is also s/p benign FNA of the right mid 2.1 cm nodule and left superior 2 cm nodule on/05/2017  She has also been diagnosed with hyperthyroidism and has been on methimazole for years     SUBJECTIVE:   During the last visit (04/18/2023): A1c 9.4%  Today (08/23/23): Kristina Adams is here for follow-up on diabetes management.  She checks her blood sugars multiple times daily. The patient has not had hypoglycemic episodes since the last clinic visit.   She has not noted any changes in local neck swelling  She has not had her FNA yet  She is IBS-C  follows with GI , scheduled for colonoscopy on 2/24 . She is on Linzess  Denies nausea or vomiting  Has palpitation    HOME ENDOCRINE REGIMEN: Glipizide 10 mg  , 2 tabs BID Trulicity 0.75 mg weekly- not taking  Lantus 40 units BID- takes 45 units daily  Methimazole 5 mg BID     Statin: yes ACE-I/ARB: yes   CONTINUOUS GLUCOSE MONITORING RECORD INTERPRETATION    Dates of Recording: 1/29 - 08/23/2023  Sensor description: Dexcom  Results statistics:   CGM use % of time 56  Average and SD 182/44  Time in range     46   %  % Time Above 180 49  % Time above 250 5  % Time Below target 0   Glycemic patterns summary: BGs are optimal overnight and increased throughout the day  Hyperglycemic episodes postprandial  Hypoglycemic episodes occurred N/A  Overnight periods: Optimal   DIABETIC COMPLICATIONS: Microvascular complications:  Neuropathy  Denies: CKD, retinopathy  Last eye exam: Completed 01/2023  Macrovascular complications:   Denies: CAD, PVD, CVA   PAST HISTORY: Past Medical History:  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/03/2011   GERD (gastroesophageal reflux disease)    H/O hiatal hernia  HLD (hyperlipidemia)    Hypertension 12/03/2011   Hyperthyroidism    IBS (irritable bowel syndrome)    Morbid obesity with BMI of 45.0-49.9, adult (HCC) 12/03/2011   Sleep apnea 12/03/2011   last sleep study May 2013   Past Surgical History:  Past Surgical History:  Procedure Laterality Date   BIOPSY  07/29/2022   Procedure: BIOPSY;  Surgeon: Meridee Score Netty Starring., MD;  Location: [facility];  Service: Gastroenterology;;   CESAREAN SECTION     x 2   COLONOSCOPY WITH PROPOFOL N/A 07/29/2022   Procedure: COLONOSCOPY WITH PROPOFOL;  Surgeon: Lemar Lofty., MD;  Location: [facility];  Service: Gastroenterology;  Laterality: N/A;   ESOPHAGOGASTRODUODENOSCOPY (EGD) WITH PROPOFOL  N/A 07/29/2022   Procedure: ESOPHAGOGASTRODUODENOSCOPY (EGD) WITH PROPOFOL;  Surgeon: Meridee Score Netty Starring., MD;  Location: [facility];  Service: Gastroenterology;  Laterality: N/A;   EUS N/A 07/29/2022   Procedure: UPPER ENDOSCOPIC ULTRASOUND (EUS) LINEAR;  Surgeon: Lemar Lofty., MD;  Location: [facility];  Service: Gastroenterology;  Laterality: N/A;   FINE NEEDLE ASPIRATION N/A 07/29/2022   Procedure: FINE NEEDLE ASPIRATION (FNA) LINEAR;  Surgeon: Lemar Lofty., MD;  Location: [facility];  Service: Gastroenterology;  Laterality: N/A;   LAPAROSCOPIC APPENDECTOMY  12/03/2011   Procedure: APPENDECTOMY LAPAROSCOPIC;  Surgeon: Lodema Pilot, DO;  Location: [facility];  Service: General;  Laterality: N/A;  drainage of intra-abdominal abscess    PARS PLANA VITRECTOMY Left 08/17/2012   Procedure: PARS PLANA VITRECTOMY WITH 25 GAUGE;  Surgeon: Sherrie George, MD;  Location: [facility] OR;  Service: Ophthalmology;  Laterality: Left;   POLYPECTOMY  07/29/2022   Procedure: POLYPECTOMY;  Surgeon: Mansouraty, Netty Starring., MD;  Location: [facility];  Service: Gastroenterology;;   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 She has never used smokeless tobacco. She reports that she does not currently use alcohol after a past usage of about 1.0 standard drink of alcohol per week. She reports that she does not use drugs. Family History:  Family History  Problem Relation Age of Onset   Breast cancer Mother 1   Diabetes Mother    Hypertension Mother    Kidney failure Father    Heart disease Father        No details   Diabetes Sister    Hypertension Sister    Hyperlipidemia Sister    Diabetes Maternal Uncle    Cancer Maternal Uncle         type unknown   Diabetes Paternal Aunt    Hypertension Paternal Aunt    Breast cancer Paternal Aunt    Fibromyalgia Paternal Aunt    Breast cancer Maternal Grandmother    Hypertension Maternal Grandmother    Kidney disease Maternal Grandfather    Cancer Maternal Grandfather        type unknown   Diabetes Paternal Grandmother    Diabetes Paternal Grandfather    Diabetes Daughter    Colon cancer Neg Hx    Esophageal cancer Neg Hx    Inflammatory bowel disease Neg Hx    Liver disease Neg Hx    Pancreatic cancer Neg Hx    Rectal cancer Neg Hx    Stomach cancer Neg Hx      HOME MEDICATIONS: Allergies as  of 08/23/2023       Reactions   Latex Other (See Comments)   Infection in left middle finger from latex gloves due to moisture.   Augmentin [amoxicillin-pot Clavulanate] Itching   Burning and itching, back pains, elevated sugar levels   Iohexol     Code: RASH, Desc: PATIENT CALLED 06/17/08-STATED AFTER 6 HOURS POST IV CONTRAST, HANDS,SCALP AND GROIN AREA DEVELOPED ITCHING,BURNING SENSATION, Onset Date: [id number]        Medication List        Accurate as of August 23, 2023  1:35 PM. If you have any questions, ask your nurse or doctor.          ascorbic acid 500 MG tablet Commonly known as: VITAMIN C Take 1,000 mg by mouth daily.   aspirin EC 81 MG tablet Take by mouth.   Blood Pressure Monitor Devi Please provide patient with insurance approved blood pressure monitor   cyclobenzaprine 10 MG tablet Commonly known as: FLEXERIL TAKE 1 TABLET (10 MG TOTAL) BY MOUTH 3 (THREE) TIMES DAILY AS NEEDED FOR MUSCLE SPASMS.   Dexcom G7 Sensor Misc Use to check blood sugar continuously throughout the day. E11.9   diclofenac Sodium 1 % Gel Commonly known as: VOLTAREN APPLY 4 G TOPICALLY 4 (FOUR) TIMES DAILY. What changed:  when to take this reasons to take this   fenofibrate 145 MG tablet Commonly known as: TRICOR Take 1 tablet (145 mg total) by mouth daily.    gabapentin 300 MG capsule Commonly known as: NEURONTIN Take 1 capsule (300 mg total) by mouth at bedtime.   glipiZIDE 10 MG tablet Commonly known as: Glucotrol Take 2 tablets (20 mg total) by mouth 2 (two) times daily before a meal.   hydrochlorothiazide 25 MG tablet Commonly known as: HYDRODIURIL Take 1 tablet (25 mg total) by mouth daily.   hyoscyamine 0.125 MG tablet Commonly known as: Levsin Take 1 tablet (0.125 mg total) by mouth every 6 (six) hours as needed for cramping (diarrhea,nausea).   Insupen Pen Needles 31G X 5 MM Misc Generic drug: Insulin Pen Needle Inject as directed in the morning and at bedtime.   Lantus SoloStar 100 UNIT/ML Solostar Pen Generic drug: insulin glargine Inject 40 Units into the skin 2 (two) times daily.   Linzess 290 MCG Caps capsule Generic drug: linaclotide Take 1 capsule (290 mcg total) by mouth daily before breakfast.   lisinopril 40 MG tablet Commonly known as: ZESTRIL Take 1 tablet (40 mg total) by mouth daily.   methimazole 5 MG tablet Commonly known as: TAPAZOLE Take 1 tablet (5 mg total) by mouth 2 (two) times daily.   Multi-Vitamin tablet Take 1 tablet by mouth daily.   mupirocin ointment 2 % Commonly known as: BACTROBAN Apply 1 Application topically daily.   OneTouch Delica Plus Lancet33G Misc Testing twice daily   OneTouch Verio Flex System w/Device Kit Use up to 2 times daily as directed.   OneTouch Verio test strip Generic drug: glucose blood Testing twice daily.   pantoprazole 40 MG tablet Commonly known as: PROTONIX Take 1 tablet (40 mg total) by mouth 2 (two) times daily.   predniSONE 20 MG tablet Commonly known as: DELTASONE Take 1 tablet (20 mg total) by mouth daily with breakfast for 5 days.   rosuvastatin 5 MG tablet Commonly known as: Crestor Take 1 tablet (5 mg total) by mouth daily.   Trulicity 0.75 MG/0.5ML Soaj Generic drug: Dulaglutide Inject 0.75 mg into the skin once a week.  Ventolin HFA 108 (90 Base) MCG/ACT inhaler Generic drug: albuterol Inhale 2 puffs into the lungs every 6 (six) hours as needed for wheezing or shortness of breath.   Vitamin D-3 125 MCG (5000 UT) Tabs Take 1 tablet by mouth daily.         ALLERGIES: Allergies  Allergen Reactions   Latex Other (See Comments)    Infection in left middle finger from latex gloves due to moisture.   Augmentin [Amoxicillin-Pot Clavulanate] Itching    Burning and itching, back pains, elevated sugar levels   Iohexol      Code: RASH, Desc: PATIENT CALLED 06/17/08-STATED AFTER 6 HOURS POST IV CONTRAST, HANDS,SCALP AND GROIN AREA DEVELOPED ITCHING,BURNING SENSATION, Onset Date: [id number]      REVIEW OF SYSTEMS: A comprehensive ROS was conducted with the patient and is negative except as per HPI     OBJECTIVE:   VITAL SIGNS: BP 120/70 (BP Location: Left Arm, Patient Position: Sitting, Cuff Size: Normal)   Pulse 84   Ht 5\' 10"  (1.778 m)   Wt (!) 302 lb 9.6 oz (137.3 kg)   LMP 08/09/2012   SpO2 99%   BMI 43.42 kg/m    PHYSICAL EXAM:  General: Pt appears well and is in NAD  Neck: General: Supple without adenopathy or carotid bruits. Thyroid: Thyroid size normal.  No goiter or nodules appreciated.   Lungs: Clear with good BS bilat   Heart: RRR   Abdomen:  soft, nontender  Extremities:  Lower extremities - No pretibial edema.   Neuro: MS is good with appropriate affect, pt is alert and Ox3    DM foot exam: 03/22/2023 per podiatry    DATA REVIEWED:  Lab Results  Component Value Date   HGBA1C 9.4 (H) 03/01/2023   HGBA1C 10.1 (A) 10/20/2022   HGBA1C 8.8 (A) 06/02/2022    Latest Reference Range & Units 03/01/23 11:49  Sodium 134 - 144 mmol/L 137  Potassium 3.5 - 5.2 mmol/L 4.7  Chloride 96 - 106 mmol/L 98  CO2 20 - 29 mmol/L 23  Glucose 70 - 99 mg/dL 528 (H)  BUN 6 - 24 mg/dL 25 (H)  Creatinine 4.13 - 1.00 mg/dL 2.44 (H)  Calcium 8.7 - 10.2 mg/dL 9.4  BUN/Creatinine Ratio 9 - 23  22   eGFR >59 mL/min/1.73 56 (L)  Alkaline Phosphatase 44 - 121 IU/L 66  Albumin 3.8 - 4.9 g/dL 4.1  AST 0 - 40 IU/L 15  ALT 0 - 32 IU/L 10  Total Protein 6.0 - 8.5 g/dL 6.8  Total Bilirubin 0.0 - 1.2 mg/dL <0.1     Latest Reference Range & Units 04/14/23 15:51  Total CHOL/HDL Ratio 0.0 - 4.4 ratio 2.9  Cholesterol, Total 100 - 199 mg/dL 027  HDL Cholesterol >25 mg/dL 51  Triglycerides 0 - 366 mg/dL 440  VLDL Cholesterol Cal 5 - 40 mg/dL 23  LDL Chol Calc (NIH) 0 - 99 mg/dL 75    Latest Reference Range & Units 03/01/23 11:49  TSH 0.450 - 4.500 uIU/mL 1.560  Thyroxine (T4) 4.5 - 12.0 ug/dL 7.1  Free Thyroxine Index 1.2 - 4.9  1.8  T3 Uptake Ratio 24 - 39 % 25    Thyroid Ultrasound 10/1/02024  Estimated total number of nodules >/= 1 cm: 5   Number of spongiform nodules >/=  2 cm not described below (TR1): 0   Number of mixed cystic and solid nodules >/= 1.5 cm not described below (TR2): 0   _________________________________________________________   Nodule  labeled 1 in the mid right thyroid appears unchanged, with the variance in measurement secondary to interpretation by the technologist completing the exam. By my measurement, the current 3 dimensions are unchanged when compared to study dated 10/04/2016 with the largest approximately 21 mm. This nodule underwent prior biopsy 10/20/2016. Assuming benign result no further specific follow-up would be indicated.   Nodule labeled 2 inferior right thyroid, 1.9 cm. This appears to be in a region of heterogeneous thyroid tissue, with poorly defined borders and is favored to be a pseudo nodule.   Nodule labeled 3 in the mid right thyroid, 1.7 cm is spongiform and does not meet criteria for surveillance.   Nodule labeled 4 superior left thyroid, 2 point 5 cm. This nodule has spongiform characteristics and has undergone prior biopsy 10/20/2016. No further specific follow-up would be indicated.   Nodule labeled 5 in the mid  left thyroid, 4.7 cm x 3.3 cm x 3.1 cm. Nodule is TR 4 and meets criteria for biopsy.   No adenopathy   IMPRESSION: Heterogeneous multinodular thyroid goiter, as above.   Left mid thyroid nodule (labeled 5, TR 4, 4.7 cm) meets criteria for biopsy, as designated by the newly established ACR TI-RADS criteria, and referral for biopsy is recommended.    ASSESSMENT / PLAN / RECOMMENDATIONS:   1) Type 2 Diabetes Mellitus, Poorly controlled, With Neuropathic  complications - Most recent A1c of 9.1 %. Goal A1c < 7.0 %.      -A1c remains above goal -She has not started Trulicity due to skepticism about side effects, she has not decreased Lantus as previously prescribed -She assures me compliance with glipizide -Intolerant to Metformin  - Intolerant to [employer]  - Intolerant to [employer]  -I explained to the patient due to her history of multiple intolerance to agents, I have limited options, we discussed prandial dose of insulin as well as pioglitazone.  We discussed side effect of weight gain with pioglitazone. -Patient would like to try Trulicity, she was advised to started after colonoscopy -No changes to glipizide or insulin at this time   MEDICATIONS: Start Trulicity 0.75 mg weekly Continue glipizide 10 mg, 2 tabs before breakfast and 2 tabs before supper Continue  Lantus 45 units twice daily  EDUCATION / INSTRUCTIONS: BG monitoring instructions: Patient is instructed to check her blood sugars 3 times a day, before each meal . Call [facility] if: BG persistently < 70  I reviewed the Rule of 15 for the treatment of hypoglycemia in detail with the patient. Literature supplied.   2) Diabetic complications:  Eye: Does not have known diabetic retinopathy.  Neuro/ Feet: Does have known diabetic peripheral neuropathy. Renal: Patient does not have known baseline CKD. She is  on an ACEI/ARB at present.  3) Hyperthyroidism  -Patient is clinically  euthyroid -Tolerating methimazole -No change  Medication Continue methimazole 5 mg twice daily  4) MNG:   -No local neck symptoms -She is s/p FNA of multiple nodules in the past -Her most recent ultrasound showed a nodule meeting FNA criteria.  The patient did not have her FNA yet.  A new order has been placed  Follow-up in 4 months    Signed electronically by: Lyndle Herrlich, MD  [facility]  [facility] [address] [place] [place], [place] [address] Phone: [phone] FAX: [phone]   CC: Claiborne Rigg, NP [address] [place] [address] [place] [address] Phone: [phone]  Fax: [phone]  Return to Endocrinology clinic as below: Future Appointments  Date Time Provider Department Center  10/04/2023  3:00 PM Freddie Breech, [place] [facility]  11/01/2023  2:50 PM Claiborne Rigg, NP [facility] None

## 2023-08-23 NOTE — Patient Instructions (Signed)

## 2023-08-23 NOTE — Telephone Encounter (Signed)
Patient identified by name and date of birth.   Patient aware of response and voiced understanding.

## 2023-08-24 ENCOUNTER — Other Ambulatory Visit: Payer: Self-pay | Admitting: Internal Medicine

## 2023-08-24 ENCOUNTER — Other Ambulatory Visit: Payer: Self-pay

## 2023-08-24 ENCOUNTER — Encounter: Payer: Self-pay | Admitting: Internal Medicine

## 2023-08-24 DIAGNOSIS — E0590 Thyrotoxicosis, unspecified without thyrotoxic crisis or storm: Secondary | ICD-10-CM

## 2023-08-24 LAB — T4, FREE: Free T4: 1.2 ng/dL (ref 0.8–1.8)

## 2023-08-24 LAB — TSH: TSH: 0.75 m[IU]/L (ref 0.40–4.50)

## 2023-08-24 MED ORDER — METHIMAZOLE 5 MG PO TABS
5.0000 mg | ORAL_TABLET | Freq: Two times a day (BID) | ORAL | 3 refills | Status: DC
  Filled 2023-08-24 – 2023-11-15 (×2): qty 180, 90d supply, fill #0

## 2023-08-25 ENCOUNTER — Encounter: Payer: Self-pay | Admitting: Internal Medicine

## 2023-08-30 ENCOUNTER — Telehealth: Payer: Self-pay | Admitting: Gastroenterology

## 2023-08-30 ENCOUNTER — Encounter (HOSPITAL_COMMUNITY): Payer: Self-pay | Admitting: Gastroenterology

## 2023-08-30 NOTE — Progress Notes (Signed)
 Attempted to obtain medical history for pre op call via telephone, unable to reach at this time. HIPAA compliant voicemail message left requesting return call to pre surgical testing department.

## 2023-08-30 NOTE — Telephone Encounter (Signed)
Procedure: EUS Procedure date 09/05/23 Procedure location: [facility] Arrival Time: 9:00 am Spoke with the patient Y/N: Y Any prep concerns? No  Has the patient obtained the prep from the pharmacy ? No prep needed Do you have a care partner and transportation Y Any additional concerns?

## 2023-09-02 ENCOUNTER — Other Ambulatory Visit: Payer: Self-pay

## 2023-09-02 ENCOUNTER — Encounter: Payer: Self-pay | Admitting: Hematology

## 2023-09-02 ENCOUNTER — Other Ambulatory Visit: Payer: Self-pay | Admitting: Nurse Practitioner

## 2023-09-02 ENCOUNTER — Telehealth: Payer: Self-pay

## 2023-09-02 DIAGNOSIS — K219 Gastro-esophageal reflux disease without esophagitis: Secondary | ICD-10-CM

## 2023-09-02 DIAGNOSIS — E785 Hyperlipidemia, unspecified: Secondary | ICD-10-CM

## 2023-09-02 MED ORDER — FENOFIBRATE 145 MG PO TABS
145.0000 mg | ORAL_TABLET | Freq: Every day | ORAL | 1 refills | Status: DC
  Filled 2023-09-02: qty 30, 30d supply, fill #0
  Filled 2023-09-06: qty 90, 90d supply, fill #0
  Filled 2023-12-06: qty 90, 90d supply, fill #1

## 2023-09-02 MED ORDER — PANTOPRAZOLE SODIUM 40 MG PO TBEC
40.0000 mg | DELAYED_RELEASE_TABLET | Freq: Two times a day (BID) | ORAL | 0 refills | Status: DC
  Filled 2023-09-02: qty 60, 30d supply, fill #0
  Filled 2023-09-06: qty 180, 90d supply, fill #0
  Filled 2023-10-16: qty 60, 30d supply, fill #0
  Filled 2023-11-12: qty 60, 30d supply, fill #1
  Filled 2023-12-13: qty 60, 30d supply, fill #2

## 2023-09-02 NOTE — Telephone Encounter (Signed)
 routing

## 2023-09-02 NOTE — Telephone Encounter (Signed)
Copied from CRM [id number]. Topic: General - Other >> Sep 02, 2023 11:50 AM Kristie Cowman wrote: Reason for CRM: Patient would like to speak with Zelda Fleming's nurse herself directly.

## 2023-09-03 NOTE — Anesthesia Preprocedure Evaluation (Signed)
 Anesthesia Evaluation  Patient identified by MRN, date of birth, ID band Patient awake    Reviewed: Allergy & Precautions, NPO status , Patient's Chart, lab work & pertinent test results  Airway Mallampati: IV  TM Distance: >3 FB Neck ROM: Full    Dental  (+) Teeth Intact, Dental Advisory Given   Pulmonary sleep apnea (per pt sleep study 2013 but pt lost to follow up) , Current Smoker Black and mild cigars No inhalers   Pulmonary exam normal breath sounds clear to auscultation       Cardiovascular hypertension (197/43 preop, 120/70 per pt normally), Pt. on medications Normal cardiovascular exam Rhythm:Regular Rate:Normal     Neuro/Psych  PSYCHIATRIC DISORDERS Anxiety Depression    negative neurological ROS     GI/Hepatic Neg liver ROS, hiatal hernia,GERD  Controlled and Medicated,,  Endo/Other  diabetes, Well Controlled, Type 2, Insulin Dependent  Class 3 obesityBMI 43  Renal/GU negative Renal ROS  negative genitourinary   Musculoskeletal  (+) Arthritis , Osteoarthritis,    Abdominal  (+) + obese  Peds  Hematology negative hematology ROS (+)   Anesthesia Other Findings Trulicity- hasn't started yet  Reproductive/Obstetrics negative OB ROS                             Anesthesia Physical Anesthesia Plan  ASA: 3  Anesthesia Plan: MAC   Post-op Pain Management:    Induction:   PONV Risk Score and Plan: 2 and Propofol infusion and TIVA  Airway Management Planned: Natural Airway and Simple Face Mask  Additional Equipment: None  Intra-op Plan:   Post-operative Plan:   Informed Consent: I have reviewed the patients History and Physical, chart, labs and discussed the procedure including the risks, benefits and alternatives for the proposed anesthesia with the patient or authorized representative who has indicated his/her understanding and acceptance.       Plan Discussed with:  CRNA  Anesthesia Plan Comments:        Anesthesia Quick Evaluation

## 2023-09-05 ENCOUNTER — Other Ambulatory Visit: Payer: Self-pay

## 2023-09-05 ENCOUNTER — Encounter: Payer: Self-pay | Admitting: Hematology

## 2023-09-05 ENCOUNTER — Ambulatory Visit (HOSPITAL_COMMUNITY): Payer: Self-pay | Admitting: Anesthesiology

## 2023-09-05 ENCOUNTER — Ambulatory Visit (HOSPITAL_BASED_OUTPATIENT_CLINIC_OR_DEPARTMENT_OTHER): Payer: Self-pay | Admitting: Anesthesiology

## 2023-09-05 ENCOUNTER — Encounter (HOSPITAL_COMMUNITY)
Admission: RE | Disposition: A | Discharge status: Home / Self Care | Payer: Self-pay | Source: Home / Self Care | Attending: Gastroenterology

## 2023-09-05 ENCOUNTER — Encounter (HOSPITAL_COMMUNITY): Payer: Self-pay | Admitting: Gastroenterology

## 2023-09-05 ENCOUNTER — Ambulatory Visit (HOSPITAL_COMMUNITY)
Admission: RE | Admit: 2023-09-05 | Discharge: 2023-09-05 | Disposition: A | Discharge status: Home / Self Care | Payer: Medicare Other | Attending: Gastroenterology | Admitting: Gastroenterology

## 2023-09-05 DIAGNOSIS — K229 Disease of esophagus, unspecified: Secondary | ICD-10-CM | POA: Diagnosis not present

## 2023-09-05 DIAGNOSIS — F17210 Nicotine dependence, cigarettes, uncomplicated: Secondary | ICD-10-CM | POA: Diagnosis not present

## 2023-09-05 DIAGNOSIS — K2289 Other specified disease of esophagus: Secondary | ICD-10-CM | POA: Diagnosis present

## 2023-09-05 DIAGNOSIS — D219 Benign neoplasm of connective and other soft tissue, unspecified: Secondary | ICD-10-CM | POA: Diagnosis not present

## 2023-09-05 DIAGNOSIS — F17290 Nicotine dependence, other tobacco product, uncomplicated: Secondary | ICD-10-CM | POA: Diagnosis not present

## 2023-09-05 DIAGNOSIS — D130 Benign neoplasm of esophagus: Secondary | ICD-10-CM

## 2023-09-05 DIAGNOSIS — Z6841 Body Mass Index (BMI) 40.0 and over, adult: Secondary | ICD-10-CM | POA: Insufficient documentation

## 2023-09-05 DIAGNOSIS — I10 Essential (primary) hypertension: Secondary | ICD-10-CM | POA: Diagnosis not present

## 2023-09-05 DIAGNOSIS — Z8509 Personal history of malignant neoplasm of other digestive organs: Secondary | ICD-10-CM

## 2023-09-05 DIAGNOSIS — Z794 Long term (current) use of insulin: Secondary | ICD-10-CM | POA: Insufficient documentation

## 2023-09-05 DIAGNOSIS — K449 Diaphragmatic hernia without obstruction or gangrene: Secondary | ICD-10-CM | POA: Insufficient documentation

## 2023-09-05 DIAGNOSIS — E119 Type 2 diabetes mellitus without complications: Secondary | ICD-10-CM | POA: Diagnosis not present

## 2023-09-05 HISTORY — PX: FINE NEEDLE ASPIRATION: SHX5430

## 2023-09-05 HISTORY — PX: UPPER ESOPHAGEAL ENDOSCOPIC ULTRASOUND (EUS): SHX6562

## 2023-09-05 HISTORY — PX: ESOPHAGOGASTRODUODENOSCOPY (EGD) WITH PROPOFOL: SHX5813

## 2023-09-05 LAB — GLUCOSE, CAPILLARY: Glucose-Capillary: 76 mg/dL (ref 70–99)

## 2023-09-05 SURGERY — UPPER ESOPHAGEAL ENDOSCOPIC ULTRASOUND (EUS)
Anesthesia: Monitor Anesthesia Care

## 2023-09-05 MED ORDER — DEXMEDETOMIDINE HCL IN NACL 80 MCG/20ML IV SOLN
INTRAVENOUS | Status: AC
  Filled 2023-09-05: qty 20

## 2023-09-05 MED ORDER — PROPOFOL 500 MG/50ML IV EMUL
INTRAVENOUS | Status: AC
Start: 2023-09-05 — End: ?
  Filled 2023-09-05: qty 50

## 2023-09-05 MED ORDER — SODIUM CHLORIDE 0.9 % IV SOLN: INTRAVENOUS | Status: DC

## 2023-09-05 MED ORDER — GLYCOPYRROLATE PF 0.2 MG/ML IJ SOSY
PREFILLED_SYRINGE | INTRAMUSCULAR | Status: DC | PRN
  Administered 2023-09-05: .2 mg via INTRAVENOUS

## 2023-09-05 MED ORDER — PROPOFOL 10 MG/ML IV BOLUS
INTRAVENOUS | Status: DC | PRN
  Administered 2023-09-05: 150 ug/kg/min via INTRAVENOUS
  Administered 2023-09-05 (×3): 50 mg via INTRAVENOUS

## 2023-09-05 MED ORDER — PROPOFOL 500 MG/50ML IV EMUL
INTRAVENOUS | Status: DC | PRN
  Administered 2023-09-05: 100 mg via INTRAVENOUS
  Administered 2023-09-05: 50 mg via INTRAVENOUS
  Administered 2023-09-05: 150 ug/kg/min via INTRAVENOUS

## 2023-09-05 MED ORDER — LIDOCAINE 2% (20 MG/ML) 5 ML SYRINGE
INTRAMUSCULAR | Status: DC | PRN
  Administered 2023-09-05: 100 mg via INTRAVENOUS

## 2023-09-05 MED ORDER — SUCCINYLCHOLINE CHLORIDE 200 MG/10ML IV SOSY
PREFILLED_SYRINGE | INTRAVENOUS | Status: DC | PRN
  Administered 2023-09-05: 200 mg via INTRAVENOUS

## 2023-09-05 MED ORDER — DEXMEDETOMIDINE HCL IN NACL 80 MCG/20ML IV SOLN
INTRAVENOUS | Status: DC | PRN
Start: 2023-09-05 — End: 2023-09-05
  Administered 2023-09-05 (×2): 8 ug via INTRAVENOUS
  Administered 2023-09-05: 12 ug via INTRAVENOUS

## 2023-09-05 MED ORDER — DEXAMETHASONE SODIUM PHOSPHATE 10 MG/ML IJ SOLN
INTRAMUSCULAR | Status: DC | PRN
  Administered 2023-09-05: 8 mg via INTRAVENOUS

## 2023-09-05 MED ORDER — ONDANSETRON HCL 4 MG/2ML IJ SOLN
INTRAMUSCULAR | Status: DC | PRN
  Administered 2023-09-05: 4 mg via INTRAVENOUS

## 2023-09-05 MED ORDER — ALBUTEROL SULFATE HFA 108 (90 BASE) MCG/ACT IN AERS
INHALATION_SPRAY | RESPIRATORY_TRACT | Status: AC
  Filled 2023-09-05: qty 6.7

## 2023-09-05 MED ORDER — PROPOFOL 500 MG/50ML IV EMUL
INTRAVENOUS | Status: AC
  Filled 2023-09-05: qty 50

## 2023-09-05 MED ORDER — PROPOFOL 1000 MG/100ML IV EMUL
INTRAVENOUS | Status: AC
  Filled 2023-09-05: qty 100

## 2023-09-05 MED ORDER — ALBUTEROL SULFATE HFA 108 (90 BASE) MCG/ACT IN AERS
INHALATION_SPRAY | RESPIRATORY_TRACT | Status: DC | PRN
  Administered 2023-09-05: 2 via RESPIRATORY_TRACT

## 2023-09-05 NOTE — Op Note (Signed)
 [facility] Patient Name: Kristina Adams Procedure Date: 09/05/2023 MRN: [id number] Attending MD: Corliss Parish , MD, [id number] Date of Birth: September 30, 1965 CSN: [id number] Age: 58 Admit Type: Outpatient Procedure:                Upper EUS Indications:              Esophageal deformity on endoscopy/Subepithelial                            tumor vs. extrinsic compression Providers:                Corliss Parish, MD, Fransisca Connors, Kandice Robinsons, Technician Referring MD:              Medicines:                Monitored Anesthesia Care transitioned to General                            Anesthesia Complications:            No immediate complications. Estimated Blood Loss:     Estimated blood loss was minimal. Procedure:                Pre-Anesthesia Assessment:                           - Prior to the procedure, a History and Physical                            was performed, and patient medications and                            allergies were reviewed. The patient's tolerance of                            previous anesthesia was also reviewed. The risks                            and benefits of the procedure and the sedation                            options and risks were discussed with the patient.                            All questions were answered, and informed consent                            was obtained. Prior Anticoagulants: The patient has                            taken no anticoagulant or antiplatelet agents                            except for aspirin. ASA Grade Assessment: III -  A                            patient with severe systemic disease. After                            reviewing the risks and benefits, the patient was                            deemed in satisfactory condition to undergo the                            procedure.                           After obtaining informed consent, the endoscope was                             passed under direct vision. Throughout the                            procedure, the patient's blood pressure, pulse, and                            oxygen saturations were monitored continuously. The                            [id number]-H190 ([id number]) Olympus endoscope was introduced                            through the mouth, and advanced to the second part                            of duodenum. The [id number] ([id number]) Olympus linear                            ultrasound scope was introduced through the mouth,                            and advanced to the stomach for ultrasound                            examination from the esophagus and stomach. The                            [id number]-[id number] ([id number]) Olympus Forward View EUS was                            introduced through the mouth, and advanced to the                            stomach for ultrasound examination from the                            esophagus  and stomach. The upper EUS was                            accomplished without difficulty. The patient                            tolerated the procedure. Scope In: Scope Out: Findings:      ENDOSCOPIC FINDING: :      No gross lesions were noted in the proximal esophagus and in the mid       esophagus.      A single 15 mm subepithelial nodule was found in the distal esophagus,       42 cm from the incisors.      The Z-line was irregular and was found 45 cm from the incisors.      A 3 cm hiatal hernia was present.      No gross lesions were noted in the entire examined stomach.      No gross lesions were noted in the duodenal bulb, in the first portion       of the duodenum and in the second portion of the duodenum.      ENDOSONOGRAPHIC FINDING: :      An oval intramural (subepithelial) lesion was found in the thoracic       esophagus. It was encountered at 42 cm from the incisors. The lesion was       hypoechoic and calcified. Sonographically, the origin  appeared to be       within the deep mucosa (Layer 2). The mass measured up to 17 mm by 12 mm       in cross-sectional diameter. The endosonographic borders were       well-defined. Fine needle biopsy was performed. Color Doppler imaging       was utilized prior to needle puncture to confirm a lack of significant       vascular structures within the needle path. Six passes were made with       the 22 gauge Acquire S biopsy needle using a transesophageal approach. A       visible core of tissue was obtained. Preliminary cytologic examination       and touch preps were performed. Final cytology results are pending.      Endosonographic imaging in the visualized portion of the liver showed no       mass.      The celiac region was visualized. Impression:               EGD Impression:                           - No gross lesions in the proximal esophagus and in                            the mid esophagus.                           - Subepithelial nodule found in the esophagus noted                            distally.                           -  Z-line irregular, 45 cm from the incisors.                           - 3 cm hiatal hernia.                           - No gross lesions in the entire stomach.                           - No gross lesions in the duodenal bulb, in the                            first portion of the duodenum and in the second                            portion of the duodenum.                           EUS Impression:                           - An intramural (subepithelial) lesion was found in                            the thoracic esophagus. It appeared to originate                            from within the deep mucosa (Layer 2). Cytology                            results are pending. However, the endosonographic                            appearance is suggestive of a stromal cell (smooth                            muscle) lesion (Leiomyoma v GIST). Fine needle                             biopsy performed. Moderate Sedation:      Not Applicable - Patient had care per Anesthesia. Recommendation:           - The patient will be observed post-procedure,                            until all discharge criteria are met.                           - Discharge patient to home.                           - Patient has a contact number available for                            emergencies. The signs and  symptoms of potential                            delayed complications were discussed with the                            patient. Return to normal activities tomorrow.                            Written discharge instructions were provided to the                            patient.                           - Resume previous diet.                           - Observe patient's clinical course.                           - Await cytology results.                           - Repeat the upper endoscopic ultrasound for                            surveillance will be determined after cytology                            results. If this is a GIST, will need to consider                            referral to CTS. If this is a Leiomyoma, will need                            monitoring (though thankfully no overt change in                            almost 10 years of evaluation of this lesion, and                            no symptoms).                           - The findings and recommendations were discussed                            with the patient.                           - The findings and recommendations were discussed                            with the patient's family. Procedure Code(s):        --- Professional ---  16109, Esophagogastroduodenoscopy, flexible,                            transoral; with transendoscopic ultrasound-guided                            intramural or transmural fine needle                             aspiration/biopsy(s), (includes endoscopic                            ultrasound examination limited to the esophagus,                            stomach or duodenum, and adjacent structures) Diagnosis Code(s):        --- Professional ---                           K22.89, Other specified disease of esophagus                           K44.9, Diaphragmatic hernia without obstruction or                            gangrene CPT copyright 2022 American Medical Association. All rights reserved. The codes documented in this report are preliminary and upon coder review may  be revised to meet current compliance requirements. Corliss Parish, MD 09/05/2023 11:41:51 AM Number of Addenda: 0

## 2023-09-05 NOTE — Transfer of Care (Signed)
 Immediate Anesthesia Transfer of Care Note  Patient: Kristina Adams  Procedure(s) Performed: UPPER ESOPHAGEAL ENDOSCOPIC ULTRASOUND (EUS) ESOPHAGOGASTRODUODENOSCOPY (EGD) WITH PROPOFOL FINE NEEDLE ASPIRATION (FNA) LINEAR  Patient Location: PACU and Endoscopy Unit  Anesthesia Type:General  Level of Consciousness: drowsy  Airway & Oxygen Therapy: Patient Spontanous Breathing and Patient connected to face mask oxygen  Post-op Assessment: Report given to RN, Post -op Vital signs reviewed and stable, and Patient moving all extremities  Post vital signs: Reviewed and stable  Last Vitals:  Vitals Value Taken Time  BP 140/86 09/05/23 1140  Temp    Pulse 73 09/05/23 1140  Resp 21 09/05/23 1140  SpO2 100 % 09/05/23 1140  Vitals shown include unfiled device data.  Last Pain:  Vitals:   09/05/23 0941  TempSrc: Tympanic  PainSc: 0-No pain         Complications: No notable events documented.

## 2023-09-05 NOTE — H&P (Signed)
 GASTROENTEROLOGY PROCEDURE H&P NOTE   Primary Care Physician: Claiborne Rigg, NP  HPI: Kristina Adams is a 58 y.o. female who presents for esophageal SEL surveillance (previous samples with muscle cells unclear Leiomyoma v GIST) known lesion for >10 years.  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, MD;  Location: [facility];  Service: Gastroenterology;  Laterality: N/A;   ESOPHAGOGASTRODUODENOSCOPY (EGD) WITH PROPOFOL N/A 07/29/2022   Procedure: ESOPHAGOGASTRODUODENOSCOPY (EGD) WITH PROPOFOL;  Surgeon: Meridee Score Netty Starring., MD;  Location: [facility];  Service: Gastroenterology;  Laterality: N/A;   EUS N/A 07/29/2022   Procedure: UPPER ENDOSCOPIC ULTRASOUND (EUS) LINEAR;  Surgeon: Lemar Lofty., MD;  Location: [facility];  Service: Gastroenterology;  Laterality: N/A;   FINE NEEDLE ASPIRATION N/A 07/29/2022   Procedure: FINE NEEDLE ASPIRATION (FNA) LINEAR;  Surgeon: Lemar Lofty., MD;  Location: [facility];  Service: Gastroenterology;  Laterality: N/A;   LAPAROSCOPIC APPENDECTOMY  12/03/2011   Procedure:  APPENDECTOMY LAPAROSCOPIC;  Surgeon: Lodema Pilot, DO;  Location: [facility];  Service: General;  Laterality: N/A;  drainage of intra-abdominal abscess    PARS PLANA VITRECTOMY Left 08/17/2012   Procedure: PARS PLANA VITRECTOMY WITH 25 GAUGE;  Surgeon: Sherrie George, MD;  Location: [facility] OR;  Service: Ophthalmology;  Laterality: Left;   POLYPECTOMY  07/29/2022   Procedure: POLYPECTOMY;  Surgeon: Mansouraty, Netty Starring., MD;  Location: [facility];  Service: Gastroenterology;;   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   No current facility-administered medications for this encounter. Allergies  Allergen Reactions   Latex Other (See Comments)    Infection in left middle finger from latex gloves due to moisture.   Augmentin [Amoxicillin-Pot Clavulanate] Itching    Burning and itching, back pains, elevated sugar levels   Iohexol      Code: RASH, Desc: PATIENT CALLED 06/17/08-STATED AFTER 6 HOURS POST IV CONTRAST, HANDS,SCALP AND GROIN AREA DEVELOPED ITCHING,BURNING SENSATION, Onset Date: [id number]    Family History  Problem Relation Age of Onset   Breast cancer Mother 23   Diabetes Mother    Hypertension Mother    Kidney failure Father    Heart disease Father        No details   Diabetes Sister    Hypertension Sister    Hyperlipidemia Sister    Diabetes Maternal  Uncle    Cancer Maternal Uncle        type unknown   Diabetes Paternal Aunt    Hypertension Paternal Aunt    Breast cancer Paternal Aunt    Fibromyalgia Paternal Aunt    Breast cancer Maternal Grandmother    Hypertension Maternal Grandmother    Kidney disease Maternal Grandfather    Cancer Maternal Grandfather        type unknown   Diabetes Paternal Grandmother     Diabetes Paternal Grandfather    Diabetes Daughter    Colon cancer Neg Hx    Esophageal cancer Neg Hx    Inflammatory bowel disease Neg Hx    Liver disease Neg Hx    Pancreatic cancer Neg Hx    Rectal cancer Neg Hx    Stomach cancer Neg Hx    Social History   Socioeconomic History   Marital status: Divorced    Spouse name: Not on file   Number of children: 2   Years of education: Not on file   Highest education level: Not on file  Occupational History   Not on file  Tobacco Use   Smoking status: Some Days    Types: Cigars   Smokeless tobacco: Never  Vaping Use   Vaping status: Never Used  Substance and Sexual Activity   Alcohol use: Not Currently    Alcohol/week: 1.0 standard drink of alcohol    Types: 1 Glasses of wine per week    Comment: occasional   Drug use: No   Sexual activity: Yes    Birth control/protection: Pill  Other Topics Concern   Not on file  Social History Narrative   Lives alone.    Social Drivers of Health   Financial Resource Strain: Patient Declined (05/16/2023)   Overall Financial Resource Strain (CARDIA)    Difficulty of Paying Living Expenses: Patient declined  Food Insecurity: No Food Insecurity (05/16/2023)   Hunger Vital Sign    Worried About Running Out of Food in the Last Year: Never true    Ran Out of Food in the Last Year: Never true  Transportation Needs: No Transportation Needs (05/16/2023)   PRAPARE - Administrator, Civil Service (Medical): No    Lack of Transportation (Non-Medical): No  Physical Activity: Patient Declined (05/16/2023)   Exercise Vital Sign    Days of Exercise per Week: Patient declined    Minutes of Exercise per Session: Patient declined  Stress: No Stress Concern Present (05/16/2023)   [employer] of Occupational Health - Occupational Stress Questionnaire    Feeling of Stress : Not at all  Social Connections: Patient Declined (05/16/2023)   Social Connection and Isolation Panel [NHANES]     Frequency of Communication with Friends and Family: Patient declined    Frequency of Social Gatherings with Friends and Family: Patient declined    Attends Religious Services: Patient declined    Active Member of Clubs or Organizations: Patient declined    Attends Banker Meetings: Patient declined    Marital Status: Patient declined  Intimate Partner Violence: Not At Risk (05/16/2023)   Humiliation, Afraid, Rape, and Kick questionnaire    Fear of Current or Ex-Partner: No    Emotionally Abused: No    Physically Abused: No    Sexually Abused: No    Physical Exam: Today's Vitals   09/05/23 0941 09/05/23 0954  BP: (!) 190/79 (!) 165/61  Pulse: 87   Resp: 18   Temp: (!) 97.1 F (  36.2 C)   TempSrc: Tympanic   SpO2: 98%   Weight: (!) 138.8 kg   Height: 5\' 10"  (1.778 m)   PainSc: 0-No pain    Body mass index is 43.91 kg/m. GEN: NAD EYE: Sclerae anicteric ENT: MMM CV: Non-tachycardic GI: Soft, NT/ND NEURO:  Alert & Oriented x 3  Lab Results: No results for input(s): "WBC", "HGB", "HCT", "PLT" in the last 72 hours. BMET No results for input(s): "NA", "K", "CL", "CO2", "GLUCOSE", "BUN", "CREATININE", "CALCIUM" in the last 72 hours. LFT No results for input(s): "PROT", "ALBUMIN", "AST", "ALT", "ALKPHOS", "BILITOT", "BILIDIR", "IBILI" in the last 72 hours. PT/INR No results for input(s): "LABPROT", "INR" in the last 72 hours.   Impression / Plan: This is a 58 y.o.female who presents for esophageal SEL surveillance (previous samples with muscle cells unclear Leiomyoma v GIST) known lesion for >10 years.  The risks of an EUS including intestinal perforation, bleeding, infection, aspiration, and medication effects were discussed as was the possibility it may not give a definitive diagnosis if a biopsy is performed.  When a biopsy of the pancreas is done as part of the EUS, there is an additional risk of pancreatitis at the rate of about 1-2%.  It was explained that  procedure related pancreatitis is typically mild, although it can be severe and even life threatening, which is why we do not perform random pancreatic biopsies and only biopsy a lesion/area we feel is concerning enough to warrant the risk.  The risks and benefits of endoscopic evaluation/treatment were discussed with the patient and/or family; these include but are not limited to the risk of perforation, infection, bleeding, missed lesions, lack of diagnosis, severe illness requiring hospitalization, as well as anesthesia and sedation related illnesses.  The patient's history has been reviewed, patient examined, no change in status, and deemed stable for procedure.  The patient and/or family is agreeable to proceed.    Corliss Parish, MD [facility] Office # [phone]

## 2023-09-05 NOTE — Anesthesia Procedure Notes (Signed)
 Procedure Name: MAC Date/Time: 09/05/2023 10:20 AM  Performed by: Maurene Capes, CRNAPre-anesthesia Checklist: Patient identified, Emergency Drugs available, Suction available, Patient being monitored and Timeout performed Patient Re-evaluated:Patient Re-evaluated prior to induction Oxygen Delivery Method: Simple face mask Preoxygenation: Pre-oxygenation with 100% oxygen Induction Type: IV induction Placement Confirmation: positive ETCO2 Dental Injury: Teeth and Oropharynx as per pre-operative assessment

## 2023-09-05 NOTE — Anesthesia Procedure Notes (Signed)
 Procedure Name: Intubation Date/Time: 09/05/2023 10:56 AM  Performed by: Maurene Capes, CRNAPre-anesthesia Checklist: Patient identified, Emergency Drugs available, Suction available and Patient being monitored Patient Re-evaluated:Patient Re-evaluated prior to induction Oxygen Delivery Method: Circle System Utilized Preoxygenation: Pre-oxygenation with 100% oxygen Induction Type: IV induction Ventilation: Mask ventilation without difficulty Laryngoscope Size: Glidescope and 3 Tube type: Oral Tube size: 7.5 mm Number of attempts: 1 Airway Equipment and Method: Stylet and Oral airway Placement Confirmation: ETT inserted through vocal cords under direct vision, positive ETCO2 and breath sounds checked- equal and bilateral Secured at: 21 cm Tube secured with: Tape Dental Injury: Teeth and Oropharynx as per pre-operative assessment

## 2023-09-05 NOTE — Telephone Encounter (Signed)
 I have sent tessalon. However tessalon is not for wheezing. Also I only sent prednisone for 5 days and it would have expired on 08-28-2023. She should not still be taking. If she is still wheezing she should also be using her albuterol inhaler. If it is not working She needs a daily inhaler instead of an as needed inhaler.

## 2023-09-05 NOTE — Discharge Instructions (Signed)
 YOU HAD AN ENDOSCOPIC PROCEDURE TODAY: Refer to the procedure report and other information in the discharge instructions given to you for any specific questions about what was found during the examination. If this information does not answer your questions, please call [place] office at [phone] to clarify.   YOU SHOULD EXPECT: Some feelings of bloating in the abdomen. Passage of more gas than usual. Walking can help get rid of the air that was put into your GI tract during the procedure and reduce the bloating. If you had a lower endoscopy (such as a colonoscopy or flexible sigmoidoscopy) you may notice spotting of blood in your stool or on the toilet paper. Some abdominal soreness may be present for a day or two, also.  DIET: Your first meal following the procedure should be a light meal and then it is ok to progress to your normal diet. A half-sandwich or bowl of soup is an example of a good first meal. Heavy or fried foods are harder to digest and may make you feel nauseous or bloated. Drink plenty of fluids but you should avoid alcoholic beverages for 24 hours    ACTIVITY: Your care partner should take you home directly after the procedure. You should plan to take it easy, moving slowly for the rest of the day. You can resume normal activity the day after the procedure however YOU SHOULD NOT DRIVE, use power tools, machinery or perform tasks that involve climbing or major physical exertion for 24 hours (because of the sedation medicines used during the test).   SYMPTOMS TO REPORT IMMEDIATELY: A gastroenterologist can be reached at any hour. Please call [phone]  for any of the following symptoms:    Following upper endoscopy (EGD, EUS, ERCP, esophageal dilation) Vomiting of blood or coffee ground material  New, significant abdominal pain  New, significant chest pain or pain under the shoulder blades  Painful or persistently difficult swallowing  New shortness of breath  Black,  tarry-looking or red, bloody stools  FOLLOW UP:  If any biopsies were taken you will be contacted by phone or by letter within the next 1-3 weeks. Call [phone]  if you have not heard about the biopsies in 3 weeks.  Please also call with any specific questions about appointments or follow up tests.

## 2023-09-05 NOTE — Anesthesia Postprocedure Evaluation (Signed)
 Anesthesia Post Note  Patient: Kristina Adams  Procedure(s) Performed: UPPER ESOPHAGEAL ENDOSCOPIC ULTRASOUND (EUS) ESOPHAGOGASTRODUODENOSCOPY (EGD) WITH PROPOFOL FINE NEEDLE ASPIRATION (FNA) LINEAR     Patient location during evaluation: PACU Anesthesia Type: MAC Level of consciousness: awake and alert Pain management: pain level controlled Vital Signs Assessment: post-procedure vital signs reviewed and stable Respiratory status: spontaneous breathing, nonlabored ventilation and respiratory function stable Cardiovascular status: blood pressure returned to baseline and stable Postop Assessment: no apparent nausea or vomiting Anesthetic complications: no   No notable events documented.  Last Vitals:  Vitals:   09/05/23 1200 09/05/23 1210  BP: 115/67 (!) 119/54  Pulse: 69 64  Resp: 19 15  Temp:    SpO2: 98% 99%    Last Pain:  Vitals:   09/05/23 1210  TempSrc:   PainSc: 3                  Lannie Fields

## 2023-09-06 ENCOUNTER — Other Ambulatory Visit: Payer: Self-pay

## 2023-09-06 ENCOUNTER — Encounter (HOSPITAL_COMMUNITY): Payer: Self-pay | Admitting: Gastroenterology

## 2023-09-06 ENCOUNTER — Other Ambulatory Visit: Payer: Self-pay | Admitting: Gastroenterology

## 2023-09-06 ENCOUNTER — Encounter: Payer: Self-pay | Admitting: Hematology

## 2023-09-06 MED ORDER — LINACLOTIDE 290 MCG PO CAPS
290.0000 ug | ORAL_CAPSULE | Freq: Every day | ORAL | 0 refills | Status: DC
  Filled 2023-09-06: qty 90, 90d supply, fill #0

## 2023-09-06 NOTE — Telephone Encounter (Signed)
 Unable to reach patient by phone to relay response from provider.  Voicemail left with response per DPR.

## 2023-09-07 ENCOUNTER — Other Ambulatory Visit: Payer: Self-pay

## 2023-09-08 ENCOUNTER — Encounter: Payer: Self-pay | Admitting: Gastroenterology

## 2023-09-08 LAB — CYTOLOGY - NON PAP

## 2023-09-09 ENCOUNTER — Other Ambulatory Visit: Payer: Self-pay | Admitting: Nurse Practitioner

## 2023-09-09 ENCOUNTER — Encounter: Payer: Self-pay | Admitting: Hematology

## 2023-09-09 ENCOUNTER — Other Ambulatory Visit: Payer: Self-pay

## 2023-09-09 DIAGNOSIS — J4520 Mild intermittent asthma, uncomplicated: Secondary | ICD-10-CM

## 2023-09-09 MED ORDER — BENZONATATE 200 MG PO CAPS
200.0000 mg | ORAL_CAPSULE | Freq: Two times a day (BID) | ORAL | 0 refills | Status: DC | PRN
  Filled 2023-09-09: qty 30, 15d supply, fill #0

## 2023-09-12 ENCOUNTER — Other Ambulatory Visit: Payer: Self-pay

## 2023-09-13 ENCOUNTER — Other Ambulatory Visit: Payer: Self-pay

## 2023-09-16 ENCOUNTER — Other Ambulatory Visit: Payer: Self-pay

## 2023-09-20 ENCOUNTER — Other Ambulatory Visit: Payer: Self-pay

## 2023-09-21 ENCOUNTER — Other Ambulatory Visit: Payer: Self-pay

## 2023-09-23 ENCOUNTER — Other Ambulatory Visit (HOSPITAL_COMMUNITY)
Admission: RE | Admit: 2023-09-23 | Discharge: 2023-09-23 | Disposition: A | Discharge status: Home / Self Care | Source: Ambulatory Visit | Attending: Internal Medicine | Admitting: Internal Medicine

## 2023-09-23 ENCOUNTER — Ambulatory Visit
Admission: RE | Admit: 2023-09-23 | Discharge: 2023-09-23 | Disposition: A | Discharge status: Home / Self Care | Payer: Medicare Other | Source: Ambulatory Visit | Attending: Internal Medicine | Admitting: Internal Medicine

## 2023-09-23 DIAGNOSIS — E042 Nontoxic multinodular goiter: Secondary | ICD-10-CM | POA: Diagnosis present

## 2023-09-26 ENCOUNTER — Other Ambulatory Visit: Payer: Self-pay

## 2023-09-26 LAB — CYTOLOGY - NON PAP

## 2023-09-27 ENCOUNTER — Encounter: Payer: Self-pay | Admitting: Internal Medicine

## 2023-09-27 ENCOUNTER — Encounter: Payer: Self-pay | Admitting: Hematology

## 2023-09-28 ENCOUNTER — Other Ambulatory Visit: Payer: Self-pay

## 2023-09-30 ENCOUNTER — Other Ambulatory Visit: Payer: Self-pay

## 2023-10-03 ENCOUNTER — Other Ambulatory Visit: Payer: Self-pay

## 2023-10-04 ENCOUNTER — Ambulatory Visit (INDEPENDENT_AMBULATORY_CARE_PROVIDER_SITE_OTHER): Payer: Medicaid Other | Admitting: Podiatry

## 2023-10-04 ENCOUNTER — Encounter: Payer: Self-pay | Admitting: Internal Medicine

## 2023-10-04 ENCOUNTER — Other Ambulatory Visit: Payer: Self-pay

## 2023-10-04 DIAGNOSIS — Z91198 Patient's noncompliance with other medical treatment and regimen for other reason: Secondary | ICD-10-CM

## 2023-10-04 NOTE — Telephone Encounter (Signed)
 LMTCB to advise that biopsy was benign. Also looks like patient viewed portal message on 09/23/23

## 2023-10-04 NOTE — Progress Notes (Signed)
 1. Failure to attend appointment with reason given    Patient sick.

## 2023-10-10 ENCOUNTER — Other Ambulatory Visit: Payer: Self-pay

## 2023-10-13 ENCOUNTER — Other Ambulatory Visit: Payer: Self-pay

## 2023-10-17 ENCOUNTER — Other Ambulatory Visit: Payer: Self-pay

## 2023-10-17 ENCOUNTER — Ambulatory Visit (INDEPENDENT_AMBULATORY_CARE_PROVIDER_SITE_OTHER): Admitting: Podiatry

## 2023-10-17 ENCOUNTER — Encounter: Payer: Self-pay | Admitting: Internal Medicine

## 2023-10-17 ENCOUNTER — Encounter: Payer: Self-pay | Admitting: Podiatry

## 2023-10-17 VITALS — Ht 70.0 in | Wt 306.0 lb

## 2023-10-17 DIAGNOSIS — M79674 Pain in right toe(s): Secondary | ICD-10-CM

## 2023-10-17 DIAGNOSIS — B351 Tinea unguium: Secondary | ICD-10-CM | POA: Diagnosis not present

## 2023-10-17 DIAGNOSIS — L84 Corns and callosities: Secondary | ICD-10-CM

## 2023-10-17 DIAGNOSIS — E1142 Type 2 diabetes mellitus with diabetic polyneuropathy: Secondary | ICD-10-CM

## 2023-10-17 DIAGNOSIS — M79675 Pain in left toe(s): Secondary | ICD-10-CM | POA: Diagnosis not present

## 2023-10-17 NOTE — Patient Instructions (Signed)
 Medicare Subscriber #: [id number]

## 2023-10-18 ENCOUNTER — Other Ambulatory Visit: Payer: Self-pay

## 2023-10-18 NOTE — Progress Notes (Signed)
  Subjective:  Patient ID: Kristina Adams, female    DOB: [birth year],  MRN: [id number]  Chief Complaint  Patient presents with   Nail Problem    Pt is here for [facility].    58 y.o. female presents with the above complaint. History confirmed with patient.  Her nails are thickened elongated causing discomfort, calluses building up again as well.  Prior debridements helpful  Objective:  Physical Exam: warm, good capillary refill, no trophic changes or ulcerative lesions, and normal DP and PT pulses.  There are dystrophic yellowed discolored nail plates with subungual debris and multiple calluses (medial plantar hallux bilateral, L submetatarsal 5)   Assessment:   1. Pain due to onychomycosis of toenails of both feet   2. Callus   3. Type 2 diabetes mellitus with polyneuropathy (HCC)      Plan:  Patient was evaluated and treated and all questions answered.   Patient educated on diabetes. Discussed proper diabetic foot care and discussed risks and complications of disease. Educated patient in depth on reasons to return to the office immediately should he/she discover anything concerning or new on the feet. All questions answered. Discussed proper shoes as well.   Discussed the etiology and treatment options for the condition in detail with the patient.  Recommended debridement of the nails today. Sharp and mechanical debridement performed of all painful and mycotic nails today. Nails debrided in length and thickness using a nail nipper to level of comfort. Discussed treatment options including appropriate shoe gear. Follow up as needed for painful nails.   All symptomatic hyperkeratoses were safely debrided as a courtesy with a sterile #15 blade to patient's level of comfort without incident. We discussed preventative and palliative care of these lesions including supportive and accommodative shoegear, padding, use of a pumice stone and lotions/creams daily. ABN signed for Medicaid secondary which  will not cover this service   No follow-ups on file.

## 2023-10-26 ENCOUNTER — Other Ambulatory Visit: Payer: Self-pay

## 2023-10-31 ENCOUNTER — Other Ambulatory Visit: Payer: Self-pay

## 2023-10-31 ENCOUNTER — Other Ambulatory Visit: Payer: Self-pay | Admitting: Nurse Practitioner

## 2023-10-31 DIAGNOSIS — I10 Essential (primary) hypertension: Secondary | ICD-10-CM

## 2023-10-31 DIAGNOSIS — E1165 Type 2 diabetes mellitus with hyperglycemia: Secondary | ICD-10-CM

## 2023-10-31 DIAGNOSIS — E785 Hyperlipidemia, unspecified: Secondary | ICD-10-CM

## 2023-11-01 ENCOUNTER — Other Ambulatory Visit: Payer: Self-pay

## 2023-11-01 ENCOUNTER — Ambulatory Visit: Payer: Self-pay | Admitting: Nurse Practitioner

## 2023-11-03 ENCOUNTER — Other Ambulatory Visit: Payer: Self-pay

## 2023-11-03 ENCOUNTER — Other Ambulatory Visit: Payer: Self-pay | Admitting: Nurse Practitioner

## 2023-11-03 DIAGNOSIS — I10 Essential (primary) hypertension: Secondary | ICD-10-CM

## 2023-11-03 DIAGNOSIS — E785 Hyperlipidemia, unspecified: Secondary | ICD-10-CM

## 2023-11-03 MED ORDER — LISINOPRIL 40 MG PO TABS
40.0000 mg | ORAL_TABLET | Freq: Every day | ORAL | 0 refills | Status: DC
  Filled 2023-11-03: qty 90, 90d supply, fill #0

## 2023-11-03 MED ORDER — HYDROCHLOROTHIAZIDE 25 MG PO TABS
25.0000 mg | ORAL_TABLET | Freq: Every day | ORAL | 0 refills | Status: DC
  Filled 2023-11-03: qty 90, 90d supply, fill #0

## 2023-11-03 MED ORDER — ROSUVASTATIN CALCIUM 5 MG PO TABS
5.0000 mg | ORAL_TABLET | Freq: Every day | ORAL | 0 refills | Status: DC
  Filled 2023-11-03: qty 90, 90d supply, fill #0

## 2023-11-03 NOTE — Telephone Encounter (Signed)
 Copied from CRM [id number]. Topic: Clinical - Medication Refill >> Nov 03, 2023 11:50 AM DeAngela L wrote: Most Recent Primary Care Visit:  Provider: Winda Hastings W  Department: [facility]  Visit Type: OFFICE VISIT  Date: 08/03/2023  Medication: lisinopril  (ZESTRIL ) 40 MG tablet hydrochlorothiazide  (HYDRODIURIL ) 25 MG tablet rosuvastatin  (CRESTOR ) 5 MG tablet   Has the patient contacted their pharmacy? Yes  (Agent: If no, request that the patient contact the pharmacy for the refill. If patient does not wish to contact the pharmacy document the reason why and proceed with request.) (Agent: If yes, when and what did the pharmacy advise?)  Is this the correct pharmacy for this prescription? Yes  If no, delete pharmacy and type the correct one.  This is the patient's preferred pharmacy:  [facility] [address] [place] [address] Phone: [phone] Fax: [phone]   Has the prescription been filled recently? Yes   Is the patient out of the medication? Yes   Has the patient been seen for an appointment in the last year OR does the patient have an upcoming appointment? Yes   Can we respond through MyChart? Yes   Agent: Please be advised that Rx refills may take up to 3 business days. We ask that you follow-up with your pharmacy.

## 2023-11-06 ENCOUNTER — Other Ambulatory Visit: Payer: Self-pay | Admitting: Nurse Practitioner

## 2023-11-06 DIAGNOSIS — E1142 Type 2 diabetes mellitus with diabetic polyneuropathy: Secondary | ICD-10-CM

## 2023-11-07 ENCOUNTER — Other Ambulatory Visit: Payer: Self-pay | Admitting: Nurse Practitioner

## 2023-11-07 ENCOUNTER — Other Ambulatory Visit: Payer: Self-pay

## 2023-11-07 DIAGNOSIS — E1165 Type 2 diabetes mellitus with hyperglycemia: Secondary | ICD-10-CM

## 2023-11-07 MED ORDER — ONETOUCH VERIO VI STRP
ORAL_STRIP | 0 refills | Status: AC
  Filled 2023-11-07: qty 200, 100d supply, fill #0

## 2023-11-08 ENCOUNTER — Other Ambulatory Visit: Payer: Self-pay

## 2023-11-08 ENCOUNTER — Encounter: Payer: Self-pay | Admitting: Hematology

## 2023-11-08 MED ORDER — GABAPENTIN 300 MG PO CAPS
300.0000 mg | ORAL_CAPSULE | Freq: Every day | ORAL | 2 refills | Status: DC
  Filled 2023-11-08: qty 270, 270d supply, fill #0
  Filled 2023-11-12: qty 90, 90d supply, fill #0
  Filled 2024-04-10: qty 90, 90d supply, fill #1

## 2023-11-11 ENCOUNTER — Other Ambulatory Visit: Payer: Self-pay

## 2023-11-12 ENCOUNTER — Other Ambulatory Visit (HOSPITAL_BASED_OUTPATIENT_CLINIC_OR_DEPARTMENT_OTHER): Payer: Self-pay

## 2023-11-14 ENCOUNTER — Other Ambulatory Visit: Payer: Self-pay

## 2023-11-15 ENCOUNTER — Other Ambulatory Visit: Payer: Self-pay

## 2023-11-18 ENCOUNTER — Encounter: Payer: Self-pay | Admitting: Nurse Practitioner

## 2023-11-18 ENCOUNTER — Ambulatory Visit: Attending: Nurse Practitioner | Admitting: Nurse Practitioner

## 2023-11-18 ENCOUNTER — Other Ambulatory Visit: Payer: Self-pay

## 2023-11-18 VITALS — BP 148/74 | HR 71 | Temp 98.9°F | Resp 16 | Ht 70.0 in | Wt 305.0 lb

## 2023-11-18 DIAGNOSIS — Z6841 Body Mass Index (BMI) 40.0 and over, adult: Secondary | ICD-10-CM | POA: Insufficient documentation

## 2023-11-18 DIAGNOSIS — K219 Gastro-esophageal reflux disease without esophagitis: Secondary | ICD-10-CM | POA: Insufficient documentation

## 2023-11-18 DIAGNOSIS — Z794 Long term (current) use of insulin: Secondary | ICD-10-CM | POA: Insufficient documentation

## 2023-11-18 DIAGNOSIS — E559 Vitamin D deficiency, unspecified: Secondary | ICD-10-CM | POA: Insufficient documentation

## 2023-11-18 DIAGNOSIS — G8929 Other chronic pain: Secondary | ICD-10-CM | POA: Insufficient documentation

## 2023-11-18 DIAGNOSIS — K589 Irritable bowel syndrome without diarrhea: Secondary | ICD-10-CM | POA: Diagnosis not present

## 2023-11-18 DIAGNOSIS — R1010 Upper abdominal pain, unspecified: Secondary | ICD-10-CM | POA: Diagnosis not present

## 2023-11-18 DIAGNOSIS — E119 Type 2 diabetes mellitus without complications: Secondary | ICD-10-CM | POA: Diagnosis not present

## 2023-11-18 DIAGNOSIS — Z7984 Long term (current) use of oral hypoglycemic drugs: Secondary | ICD-10-CM | POA: Diagnosis not present

## 2023-11-18 DIAGNOSIS — D649 Anemia, unspecified: Secondary | ICD-10-CM | POA: Insufficient documentation

## 2023-11-18 DIAGNOSIS — E785 Hyperlipidemia, unspecified: Secondary | ICD-10-CM | POA: Diagnosis not present

## 2023-11-18 DIAGNOSIS — F17290 Nicotine dependence, other tobacco product, uncomplicated: Secondary | ICD-10-CM | POA: Insufficient documentation

## 2023-11-18 DIAGNOSIS — E0590 Thyrotoxicosis, unspecified without thyrotoxic crisis or storm: Secondary | ICD-10-CM | POA: Insufficient documentation

## 2023-11-18 DIAGNOSIS — R109 Unspecified abdominal pain: Secondary | ICD-10-CM | POA: Diagnosis not present

## 2023-11-18 DIAGNOSIS — G4730 Sleep apnea, unspecified: Secondary | ICD-10-CM | POA: Insufficient documentation

## 2023-11-18 DIAGNOSIS — I10 Essential (primary) hypertension: Secondary | ICD-10-CM | POA: Insufficient documentation

## 2023-11-18 DIAGNOSIS — M549 Dorsalgia, unspecified: Secondary | ICD-10-CM | POA: Diagnosis not present

## 2023-11-18 NOTE — Progress Notes (Signed)
 Assessment & Plan:  Kristina Adams was seen today for flank pain and back pain.  Diagnoses and all orders for this visit:  Essential hypertension -     CMP14+EGFR Continue all antihypertensives as prescribed.  Reminded to bring in blood pressure log for follow  up appointment.  RECOMMENDATIONS: DASH/Mediterranean Diets are healthier choices for HTN.    Vitamin D  deficiency disease -     VITAMIN D  25 Hydroxy (Vit-D Deficiency, Fractures)  Low hemoglobin -     CBC with Differential  Pain of upper abdomen Abdominal xray ordered.    Patient has been counseled on age-appropriate routine health concerns for screening and prevention. These are reviewed and up-to-date. Referrals have been placed accordingly. Immunizations are up-to-date or declined.    Subjective:   Chief Complaint  Patient presents with   Flank Pain    Taking Ibuprofen   Back Pain    Kristina Adams 58 y.o. female presents to office today with concers  She has a past medical history of Anemia (12/09/2011), Anxiety, Appendicitis, acute (12/03/2011), Arthritis, Chronic back pain (12/03/2011), Degenerative disc disease, cervical, Diabetes mellitus type 2 (12/03/2011), GERD, H/O hiatal hernia, HLD, Hypertension (12/03/2011), Hyperthyroidism, IBS, Morbid obesity with BMI of 45.0-49.9, adult (12/03/2011), and Sleep apnea (12/03/2011).     She is followed by ENDO for her DM. She states they are aware she is taking glipizide  30 mg BID instead of 20 mg BID.   She notes pain in the right side of her abdomen today that radiates to her back. At her last visit with me she had concerns of left sided abdominal pain that radiated to underneath her left breast and upper abdomen. I ordered an abdominal xray at that time however this was not completed. She states the pain resolves with belching, having a bowel movement or positioning her body. She is also endorsing small pellets when she has a bowel movement. She is currently taking align and  activia. Now currently on trulicity  and not ozempic    HTN She is still smoking cigars. Trying to quit. Blood pressure is elevated today.  BP Readings from Last 3 Encounters:  11/18/23 (!) 148/74  09/05/23 (!) 119/54  08/23/23 120/70         Review of Systems  Constitutional:  Negative for fever, malaise/fatigue and weight loss.  HENT: Negative.  Negative for nosebleeds.   Eyes: Negative.  Negative for blurred vision, double vision and photophobia.  Respiratory: Negative.  Negative for cough and shortness of breath.   Cardiovascular: Negative.  Negative for chest pain, palpitations and leg swelling.  Gastrointestinal:  Positive for abdominal pain and constipation. Negative for blood in stool, diarrhea, heartburn, melena, nausea and vomiting.  Musculoskeletal:  Positive for back pain and myalgias.  Neurological: Negative.  Negative for dizziness, focal weakness, seizures and headaches.  Psychiatric/Behavioral: Negative.  Negative for suicidal ideas.     Past Medical History:  Diagnosis Date   Anemia 12/09/2011   child birth   Anxiety    Appendicitis, acute 12/03/2011   Arthritis    Chronic back pain 12/03/2011   Degenerative disc disease, cervical    low back area   Diabetes mellitus type 2, insulin  dependent (HCC) 12/03/2011   GERD (gastroesophageal reflux disease)    H/O hiatal hernia    HLD (hyperlipidemia)    Hypertension 12/03/2011   Hyperthyroidism    IBS (irritable bowel syndrome)    Morbid obesity with BMI of 45.0-49.9, adult (HCC) 12/03/2011   Sleep apnea 12/03/2011  last sleep study May 2013    Past Surgical History:  Procedure Laterality Date   BIOPSY  07/29/2022   Procedure: BIOPSY;  Surgeon: Brice Campi Albino Alu., MD;  Location: [facility];  Service: Gastroenterology;;   CESAREAN SECTION     x 2   COLONOSCOPY WITH PROPOFOL  N/A 07/29/2022   Procedure: COLONOSCOPY WITH PROPOFOL ;  Surgeon: Brice Campi Albino Alu., MD;  Location: [facility];   Service: Gastroenterology;  Laterality: N/A;   ESOPHAGOGASTRODUODENOSCOPY (EGD) WITH PROPOFOL  N/A 07/29/2022   Procedure: ESOPHAGOGASTRODUODENOSCOPY (EGD) WITH PROPOFOL ;  Surgeon: Brice Campi Albino Alu., MD;  Location: [facility];  Service: Gastroenterology;  Laterality: N/A;   ESOPHAGOGASTRODUODENOSCOPY (EGD) WITH PROPOFOL  N/A 09/05/2023   Procedure: ESOPHAGOGASTRODUODENOSCOPY (EGD) WITH PROPOFOL ;  Surgeon: Brice Campi Albino Alu., MD;  Location: [facility];  Service: Gastroenterology;  Laterality: N/A;   EUS N/A 07/29/2022   Procedure: UPPER ENDOSCOPIC ULTRASOUND (EUS) LINEAR;  Surgeon: Normie Becton., MD;  Location: [facility];  Service: Gastroenterology;  Laterality: N/A;   FINE NEEDLE ASPIRATION N/A 07/29/2022   Procedure: FINE NEEDLE ASPIRATION (FNA) LINEAR;  Surgeon: Normie Becton., MD;  Location: [facility];  Service: Gastroenterology;  Laterality: N/A;   FINE NEEDLE ASPIRATION N/A 09/05/2023   Procedure: FINE NEEDLE ASPIRATION (FNA) LINEAR;  Surgeon: Normie Becton., MD;  Location: [facility];  Service: Gastroenterology;  Laterality: N/A;   LAPAROSCOPIC APPENDECTOMY  12/03/2011   Procedure: APPENDECTOMY LAPAROSCOPIC;  Surgeon: Evander Hills, DO;  Location: [facility];  Service: General;  Laterality: N/A;  drainage of intra-abdominal abscess    PARS PLANA VITRECTOMY Left 08/17/2012   Procedure: PARS PLANA VITRECTOMY WITH 25 GAUGE;  Surgeon: Rexene Catching, MD;  Location: [facility];  Service: Ophthalmology;  Laterality: Left;   POLYPECTOMY  07/29/2022   Procedure: POLYPECTOMY;  Surgeon: Mansouraty, Albino Alu., MD;  Location: [facility];  Service: Gastroenterology;;   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   Procedure: REPAIR OF COMPLEX TRACTION RETINAL DETACHMENT;  Surgeon: Rexene Catching, MD;  Location: [facility];  Service: Ophthalmology;  Laterality: Left;   TUBAL LIGATION     UPPER ESOPHAGEAL ENDOSCOPIC ULTRASOUND (EUS) N/A 09/05/2023    Procedure: UPPER ESOPHAGEAL ENDOSCOPIC ULTRASOUND (EUS);  Surgeon: Normie Becton., MD;  Location: [facility];  Service: Gastroenterology;  Laterality: N/A;    Family History  Problem Relation Age of Onset   Breast cancer Mother 55   Diabetes Mother    Hypertension Mother    Kidney failure Father    Heart disease Father        No details   Diabetes Sister    Hypertension Sister    Hyperlipidemia Sister    Diabetes Maternal Uncle    Cancer Maternal Uncle        type unknown   Diabetes Paternal Aunt    Hypertension Paternal Aunt    Breast cancer Paternal Aunt    Fibromyalgia Paternal Aunt    Breast cancer Maternal Grandmother    Hypertension Maternal Grandmother    Kidney disease Maternal Grandfather    Cancer Maternal Grandfather        type unknown   Diabetes Paternal Grandmother    Diabetes Paternal Grandfather    Diabetes Daughter    Colon cancer Neg Hx    Esophageal cancer Neg Hx    Inflammatory bowel disease Neg Hx    Liver disease Neg Hx    Pancreatic cancer Neg Hx    Rectal cancer Neg Hx    Stomach cancer Neg Hx  Social History Reviewed with no changes to be made today.   Outpatient Medications Prior to Visit  Medication Sig Dispense Refill   albuterol  (VENTOLIN  HFA) 108 (90 Base) MCG/ACT inhaler Inhale 2 puffs into the lungs every 6 (six) hours as needed for wheezing or shortness of breath. 18 g 0   blood glucose meter kit and supplies KIT Use up to 2 times daily as directed. 1 each 0   Blood Pressure Monitor DEVI Please provide patient with insurance approved blood pressure monitor 1 each 0   Cholecalciferol  (VITAMIN D -3) 125 MCG (5000 UT) TABS Take 1 tablet by mouth daily. 90 tablet 3   Continuous Glucose Sensor (DEXCOM G7 SENSOR) MISC Use to check blood sugar continuously throughout the day. E11.9 9 each 2   cyclobenzaprine  (FLEXERIL ) 10 MG tablet TAKE 1 TABLET (10 MG TOTAL) BY MOUTH 3 (THREE) TIMES DAILY AS NEEDED FOR MUSCLE SPASMS. 90 tablet 2    diclofenac  Sodium (VOLTAREN ) 1 % GEL APPLY 4 G TOPICALLY 4 (FOUR) TIMES DAILY. (Patient taking differently: Apply 4 g topically daily as needed (Muscle spasm).) 300 g 2   Dulaglutide  (TRULICITY ) 0.75 MG/0.5ML SOAJ Inject 0.75 mg into the skin once a week. 6 mL 3   fenofibrate  (TRICOR ) 145 MG tablet Take 1 tablet (145 mg total) by mouth daily. 90 tablet 1   gabapentin  (NEURONTIN ) 300 MG capsule Take 1 capsule (300 mg total) by mouth at bedtime. 270 capsule 2   glipiZIDE  (GLUCOTROL ) 10 MG tablet Take 2 tablets (20 mg total) by mouth 2 (two) times daily before a meal. 360 tablet 2   glucose blood (ONETOUCH VERIO) test strip Testing twice daily. 200 each 0   hydrochlorothiazide  (HYDRODIURIL ) 25 MG tablet Take 1 tablet (25 mg total) by mouth daily. 90 tablet 0   hyoscyamine  (LEVSIN) 0.125 MG tablet Take 1 tablet (0.125 mg total) by mouth every 6 (six) hours as needed for cramping (diarrhea,nausea). 60 tablet 1   insulin  glargine (LANTUS  SOLOSTAR) 100 UNIT/ML Solostar Pen Inject 40 Units into the skin 2 (two) times daily. 75 mL 4   Insulin  Pen Needle (B-D UF III MINI PEN NEEDLES) 31G X 5 MM MISC Inject as directed in the morning and at bedtime. 200 each 3   Lancets (ONETOUCH DELICA PLUS LANCET33G) MISC Testing twice daily 200 each 1   lisinopril  (ZESTRIL ) 40 MG tablet Take 1 tablet (40 mg total) by mouth daily. 90 tablet 0   methimazole  (TAPAZOLE ) 5 MG tablet Take 1 tablet (5 mg total) by mouth 2 (two) times daily. 180 tablet 3   Multiple Vitamin (MULTI-VITAMIN) tablet Take 1 tablet by mouth daily.     mupirocin  ointment (BACTROBAN ) 2 % Apply 1 Application topically daily. 22 g 2   pantoprazole  (PROTONIX ) 40 MG tablet Take 1 tablet (40 mg total) by mouth 2 (two) times daily. 180 tablet 0   rosuvastatin  (CRESTOR ) 5 MG tablet Take 1 tablet (5 mg total) by mouth daily. 90 tablet 0   vitamin C  (ASCORBIC ACID ) 500 MG tablet Take 1,000 mg by mouth daily.     aspirin EC 81 MG tablet Take by mouth.      benzonatate  (TESSALON ) 200 MG capsule Take 1 capsule (200 mg total) by mouth 2 (two) times daily as needed for cough. 30 capsule 0   linaclotide  (LINZESS ) 290 MCG CAPS capsule Take 1 capsule (290 mcg total) by mouth daily before breakfast. 90 capsule 0   No facility-administered medications prior to visit.    Allergies  Allergen Reactions   Latex Other (See Comments)    Infection in left middle finger from latex gloves due to moisture.   Augmentin  [Amoxicillin -Pot Clavulanate] Itching    Burning and itching, back pains, elevated sugar levels   Iohexol       Code: RASH, Desc: PATIENT CALLED 06/17/08-STATED AFTER 6 HOURS POST IV CONTRAST, HANDS,SCALP AND GROIN AREA DEVELOPED ITCHING,BURNING SENSATION, Onset Date: [id number]        Objective:    BP (!) 148/74 (BP Location: Left Arm, Patient Position: Sitting, Cuff Size: Large)   Pulse 71   Temp 98.9 F (37.2 C) (Oral)   Resp 16   Ht 5\' 10"  (1.778 m)   Wt (!) 305 lb (138.3 kg)   LMP 08/09/2012   SpO2 100%   BMI 43.76 kg/m  Wt Readings from Last 3 Encounters:  11/18/23 (!) 305 lb (138.3 kg)  10/17/23 (!) 306 lb (138.8 kg)  09/05/23 (!) 306 lb (138.8 kg)    Physical Exam Vitals and nursing note reviewed.  Constitutional:      Appearance: She is well-developed.  HENT:     Head: Normocephalic and atraumatic.  Cardiovascular:     Rate and Rhythm: Normal rate and regular rhythm.     Heart sounds: Normal heart sounds. No murmur heard.    No friction rub. No gallop.  Pulmonary:     Effort: Pulmonary effort is normal. No tachypnea or respiratory distress.     Breath sounds: Normal breath sounds. No decreased breath sounds, wheezing, rhonchi or rales.  Chest:     Chest wall: No tenderness.  Abdominal:     General: Bowel sounds are normal.     Palpations: Abdomen is soft.  Musculoskeletal:        General: Normal range of motion.     Cervical back: Normal range of motion.  Skin:    General: Skin is warm and dry.  Neurological:      Mental Status: She is alert and oriented to person, place, and time.     Coordination: Coordination normal.  Psychiatric:        Behavior: Behavior normal. Behavior is cooperative.        Thought Content: Thought content normal.        Judgment: Judgment normal.          Patient has been counseled extensively about nutrition and exercise as well as the importance of adherence with medications and regular follow-up. The patient was given clear instructions to go to ER or return to medical center if symptoms don't improve, worsen or new problems develop. The patient verbalized understanding.   Follow-up: Return in about 3 months (around 02/18/2024).   Collins Dean, FNP-BC [facility] [place], [place] [phone]   12/05/2023, 7:41 PM

## 2023-11-19 LAB — CBC WITH DIFFERENTIAL/PLATELET
Basophils Absolute: 0.1 10*3/uL (ref 0.0–0.2)
Basos: 1 %
EOS (ABSOLUTE): 0.2 10*3/uL (ref 0.0–0.4)
Eos: 3 %
Hematocrit: 33.5 % — ABNORMAL LOW (ref 34.0–46.6)
Hemoglobin: 10.6 g/dL — ABNORMAL LOW (ref 11.1–15.9)
Immature Grans (Abs): 0 10*3/uL (ref 0.0–0.1)
Immature Granulocytes: 0 %
Lymphocytes Absolute: 1.7 10*3/uL (ref 0.7–3.1)
Lymphs: 27 %
MCH: 27.2 pg (ref 26.6–33.0)
MCHC: 31.6 g/dL (ref 31.5–35.7)
MCV: 86 fL (ref 79–97)
Monocytes Absolute: 0.4 10*3/uL (ref 0.1–0.9)
Monocytes: 7 %
Neutrophils Absolute: 3.8 10*3/uL (ref 1.4–7.0)
Neutrophils: 62 %
Platelets: 348 10*3/uL (ref 150–450)
RBC: 3.9 x10E6/uL (ref 3.77–5.28)
RDW: 14.2 % (ref 11.7–15.4)
WBC: 6.2 10*3/uL (ref 3.4–10.8)

## 2023-11-19 LAB — CMP14+EGFR
ALT: 9 IU/L (ref 0–32)
AST: 15 IU/L (ref 0–40)
Albumin: 4.4 g/dL (ref 3.8–4.9)
Alkaline Phosphatase: 61 IU/L (ref 44–121)
BUN/Creatinine Ratio: 20 (ref 9–23)
BUN: 19 mg/dL (ref 6–24)
Bilirubin Total: 0.2 mg/dL (ref 0.0–1.2)
CO2: 24 mmol/L (ref 20–29)
Calcium: 9.3 mg/dL (ref 8.7–10.2)
Chloride: 99 mmol/L (ref 96–106)
Creatinine, Ser: 0.94 mg/dL (ref 0.57–1.00)
Globulin, Total: 2.4 g/dL (ref 1.5–4.5)
Glucose: 180 mg/dL — ABNORMAL HIGH (ref 70–99)
Potassium: 4.6 mmol/L (ref 3.5–5.2)
Sodium: 139 mmol/L (ref 134–144)
Total Protein: 6.8 g/dL (ref 6.0–8.5)
eGFR: 71 mL/min/{1.73_m2} (ref >59)

## 2023-11-19 LAB — VITAMIN D 25 HYDROXY (VIT D DEFICIENCY, FRACTURES): Vit D, 25-Hydroxy: 32 ng/mL (ref 30.0–100.0)

## 2023-11-20 ENCOUNTER — Encounter: Payer: Self-pay | Admitting: Nurse Practitioner

## 2023-11-29 ENCOUNTER — Telehealth: Payer: Self-pay | Admitting: Nurse Practitioner

## 2023-11-29 ENCOUNTER — Other Ambulatory Visit: Payer: Self-pay

## 2023-11-29 ENCOUNTER — Telehealth: Payer: Self-pay | Admitting: Gastroenterology

## 2023-11-29 ENCOUNTER — Other Ambulatory Visit (HOSPITAL_COMMUNITY): Payer: Self-pay

## 2023-11-29 MED ORDER — LINACLOTIDE 290 MCG PO CAPS
290.0000 ug | ORAL_CAPSULE | Freq: Every day | ORAL | 2 refills | Status: DC
  Filled 2023-11-30: qty 90, 90d supply, fill #0
  Filled 2024-03-08: qty 90, 90d supply, fill #1

## 2023-11-29 MED ORDER — LINACLOTIDE 290 MCG PO CAPS
290.0000 ug | ORAL_CAPSULE | Freq: Every day | ORAL | 0 refills | Status: DC
  Filled 2023-11-29: qty 90, 90d supply, fill #0

## 2023-11-29 NOTE — Telephone Encounter (Signed)
 Patient requesting new script written for linzess . States she is taking it twice a day instead of once a day. Please advise.   Thank you

## 2023-11-29 NOTE — Telephone Encounter (Signed)
 Copied from CRM [id number]. Topic: Clinical - Request for Lab/Test Order >> Nov 29, 2023  9:16 AM Everette C wrote:  Reason for CRM: The patient has called to share with their PCP that they would like to proceed with the orders for testing their stomach to find out the source of their previously discussed stomach discomfort  Please contact the patient if/when possible

## 2023-11-29 NOTE — Addendum Note (Signed)
 Addended by: Patricio Boop on: 11/29/2023 10:09 AM   Modules accepted: Orders

## 2023-11-29 NOTE — Telephone Encounter (Signed)
 Patient returned call, states that she is only taking Linzess  290 mcg once daily. Informed patient that I sent rx for 290 mcg with refills to [facility]. Patient voiced understanding that she can use Miralax 1 capful at bedtime prn.

## 2023-11-29 NOTE — Telephone Encounter (Signed)
 Called patient and advised that she should only be taking Linzess  290 mcg -once daily. Rx for once daily dosing sent. Patient can alternate with Miralax or use Miralax 1 capful daily at bedtime in addition to taking Linzess  290mcg- once daily. Left detailed message for patient.

## 2023-11-30 ENCOUNTER — Other Ambulatory Visit: Payer: Self-pay

## 2023-11-30 NOTE — Telephone Encounter (Signed)
 Patient identified by name and date of birth.  Patient made aware of an x-ray that was ordered for her. That will not require an appointment. Patient aware of information and voiced understanding.

## 2023-12-12 ENCOUNTER — Other Ambulatory Visit: Payer: Self-pay

## 2023-12-14 ENCOUNTER — Encounter: Payer: Self-pay | Admitting: Internal Medicine

## 2023-12-14 ENCOUNTER — Other Ambulatory Visit (HOSPITAL_COMMUNITY): Payer: Self-pay

## 2023-12-14 ENCOUNTER — Telehealth: Payer: Self-pay

## 2023-12-14 ENCOUNTER — Other Ambulatory Visit: Payer: Self-pay

## 2023-12-14 ENCOUNTER — Encounter: Payer: Self-pay | Admitting: Hematology

## 2023-12-14 ENCOUNTER — Other Ambulatory Visit: Payer: Self-pay | Admitting: Family Medicine

## 2023-12-14 ENCOUNTER — Ambulatory Visit (INDEPENDENT_AMBULATORY_CARE_PROVIDER_SITE_OTHER): Admitting: Internal Medicine

## 2023-12-14 ENCOUNTER — Ambulatory Visit
Admission: RE | Admit: 2023-12-14 | Discharge: 2023-12-14 | Disposition: A | Discharge status: Home / Self Care | Source: Ambulatory Visit | Attending: Nurse Practitioner | Admitting: Nurse Practitioner

## 2023-12-14 ENCOUNTER — Telehealth: Payer: Self-pay | Admitting: Pharmacy Technician

## 2023-12-14 VITALS — BP 134/72 | HR 74 | Ht 70.0 in | Wt 307.0 lb

## 2023-12-14 DIAGNOSIS — Z794 Long term (current) use of insulin: Secondary | ICD-10-CM | POA: Diagnosis not present

## 2023-12-14 DIAGNOSIS — E0590 Thyrotoxicosis, unspecified without thyrotoxic crisis or storm: Secondary | ICD-10-CM | POA: Diagnosis not present

## 2023-12-14 DIAGNOSIS — E042 Nontoxic multinodular goiter: Secondary | ICD-10-CM

## 2023-12-14 DIAGNOSIS — G8929 Other chronic pain: Secondary | ICD-10-CM

## 2023-12-14 DIAGNOSIS — E1142 Type 2 diabetes mellitus with diabetic polyneuropathy: Secondary | ICD-10-CM | POA: Diagnosis not present

## 2023-12-14 DIAGNOSIS — E1165 Type 2 diabetes mellitus with hyperglycemia: Secondary | ICD-10-CM

## 2023-12-14 LAB — POCT GLYCOSYLATED HEMOGLOBIN (HGB A1C): Hemoglobin A1C: 7.7 % — AB (ref 4.0–5.6)

## 2023-12-14 MED ORDER — GLIPIZIDE 10 MG PO TABS
20.0000 mg | ORAL_TABLET | Freq: Two times a day (BID) | ORAL | 2 refills | Status: AC
  Filled 2023-12-14 – 2024-01-31 (×2): qty 360, 90d supply, fill #0
  Filled 2024-05-03: qty 360, 90d supply, fill #1
  Filled 2024-07-31: qty 360, 90d supply, fill #2

## 2023-12-14 MED ORDER — METHIMAZOLE 5 MG PO TABS
5.0000 mg | ORAL_TABLET | Freq: Two times a day (BID) | ORAL | 3 refills | Status: AC
  Filled 2023-12-14 – 2024-02-14 (×2): qty 180, 90d supply, fill #0
  Filled 2024-05-03 – 2024-05-16 (×2): qty 180, 90d supply, fill #1
  Filled 2024-05-22 – 2024-05-27 (×2): qty 60, 30d supply, fill #1
  Filled 2024-07-02 – 2024-07-31 (×2): qty 60, 30d supply, fill #2

## 2023-12-14 MED ORDER — TRULICITY 1.5 MG/0.5ML ~~LOC~~ SOAJ
1.5000 mg | SUBCUTANEOUS | 3 refills | Status: DC
  Filled 2023-12-14: qty 2, 28d supply, fill #0
  Filled 2024-01-24: qty 2, 28d supply, fill #1
  Filled 2024-02-17: qty 2, 28d supply, fill #2
  Filled 2024-03-16: qty 2, 28d supply, fill #3
  Filled 2024-04-13 – 2024-05-03 (×2): qty 2, 28d supply, fill #4
  Filled 2024-06-08: qty 2, 28d supply, fill #5

## 2023-12-14 MED ORDER — BD PEN NEEDLE MINI U/F 31G X 5 MM MISC
1.0000 | Freq: Two times a day (BID) | 3 refills | Status: AC
  Filled 2023-12-14 – 2024-01-29 (×2): qty 200, 100d supply, fill #0

## 2023-12-14 MED ORDER — LANTUS SOLOSTAR 100 UNIT/ML ~~LOC~~ SOPN
36.0000 [IU] | PEN_INJECTOR | Freq: Two times a day (BID) | SUBCUTANEOUS | 4 refills | Status: DC
  Filled 2023-12-14: qty 60, 83d supply, fill #0
  Filled 2023-12-14: qty 75, 104d supply, fill #0
  Filled 2024-03-08: qty 60, 83d supply, fill #1

## 2023-12-14 NOTE — Telephone Encounter (Signed)
 Pharmacy Patient Advocate Encounter   Received notification from CoverMyMeds that prior authorization for Trulicity  1.5MG /0.5ML auto-injectors is required/requested.   Insurance verification completed.   The patient is insured through [facility] MEDICAID .   Per test claim: PA required; PA submitted to above mentioned insurance via CoverMyMeds Key/confirmation #/EOC [id number] W Status is pending

## 2023-12-14 NOTE — Telephone Encounter (Signed)
 Trulicity  needs PA . Patient due for next injection Sunday.

## 2023-12-14 NOTE — Patient Instructions (Addendum)
 Take glipizide  10 mg, 2 tablets before breakfast and 2 tablets before supper Increase Trulicity  1.5 mg once weekly Decrease Lantus  36 units twice daily   HOW TO TREAT LOW BLOOD SUGARS (Blood sugar LESS THAN 70 MG/DL) Please follow the RULE OF 15 for the treatment of hypoglycemia treatment (when your (blood sugars are less than 70 mg/dL)   STEP 1: Take 15 grams of carbohydrates when your blood sugar is low, which includes:  3-4 GLUCOSE TABS  OR 3-4 OZ OF JUICE OR REGULAR SODA OR ONE TUBE OF GLUCOSE GEL    STEP 2: RECHECK blood sugar in 15 MINUTES STEP 3: If your blood sugar is still low at the 15 minute recheck --> then, go back to STEP 1 and treat AGAIN with another 15 grams of carbohydrates.

## 2023-12-14 NOTE — Progress Notes (Signed)
 Name: Kristina Adams  MRN/ DOB: [id number], 02/19/1966   Age/ Sex: 58 y.o., female    PCP: Collins Dean, NP   Reason for Endocrinology Evaluation: Type 2 Diabetes Mellitus     Date of Initial Endocrinology Visit: 04/18/2023    PATIENT IDENTIFIER: Kristina Adams is a 58 y.o. female with a past medical history of DM, HTN, IBS, GERD. The patient presented for initial endocrinology clinic visit on 04/18/2023 for consultative assistance with her diabetes management.    HPI: Kristina Adams was    Diagnosed with DM 1988 Prior Medications tried/Intolerance: Metformin  - diarrhea . Jardiance- yeast infection . Ozempic  -constipation                   Hemoglobin A1c has ranged from 7.9% in 2022, peaking at 10.1% in 2024.   On her initial visit to our clinic she had an A1c of 9.4%, she was on glipizide , Lantus .  She had Ozempic  on her medication list but she was not taking it due to constipation.  I started on Mounjaro , increase glipizide  and decrease Lantus   Insurance did not cover Mounjaro  and was switched to Trulicity     THYROID  HISTORY: Patient was diagnosed with multinodular goiter in 2007.She is s/p benign FNA of the right inferior nodule 3.1 cm , isthmic nodule 2.4 cm 08/18/2011 She is also s/p benign FNA of the right mid 2.1 cm nodule and left superior 2 cm nodule on/05/2017  She has also been diagnosed with hyperthyroidism and has been on methimazole  for years   She is s/p benign FNA of the left 4.7 cm thyroid  nodule 09/2023  SUBJECTIVE:   During the last visit (08/23/2023)): A1c 9.1%  Today (12/14/23): Kristina Adams is here for follow-up on diabetes management.  She checks her blood sugars multiple times daily. The patient has not had hypoglycemic episodes since the last clinic visit.    She has been following up with dentistry for periodontal disease She has  IBS-C  follows with GI .She is on Linzess   Denies vomiting  but has nausea and indigestion, doesn't believe trulicity  has  an impact on this   Continue with palpitations at night  Has LE swelling and tingling at night    HOME ENDOCRINE REGIMEN: Glipizide  10 mg , 2 tabs BID- has been taking 3 tabs BID  Trulicity  0.75 mg weekly Lantus  40 units BID Methimazole  5 mg BID     Statin: yes ACE-I/ARB: yes   CONTINUOUS GLUCOSE MONITORING RECORD INTERPRETATION    Dates of Recording: 5/22-12/14/2023  Sensor description: Dexcom  Results statistics:   CGM use % of time 52  Average and SD 109/36  Time in range  85  %  % Time Above 180 1  % Time above 250 0  % Time Below target 14   Glycemic patterns summary: BGs are optimal overnight and increased throughout the day  Hyperglycemic episodes postprandial  Hypoglycemic episodes occurred N/A  Overnight periods: Optimal   DIABETIC COMPLICATIONS: Microvascular complications:  Neuropathy  Denies: CKD, retinopathy  Last eye exam: Completed 01/2023  Macrovascular complications:   Denies: CAD, PVD, CVA   PAST HISTORY: Past Medical History:  Past Medical History:  Diagnosis Date   Anemia 12/09/2011   child birth   Anxiety    Appendicitis, acute 12/03/2011   Arthritis    Chronic back pain 12/03/2011   Degenerative disc disease, cervical    low back area   Diabetes mellitus type 2, insulin  dependent (HCC)  12/03/2011   GERD (gastroesophageal reflux disease)    H/O hiatal hernia    HLD (hyperlipidemia)    Hypertension 12/03/2011   Hyperthyroidism    IBS (irritable bowel syndrome)    Morbid obesity with BMI of 45.0-49.9, adult (HCC) 12/03/2011   Sleep apnea 12/03/2011   last sleep study May 2013   Past Surgical History:  Past Surgical History:  Procedure Laterality Date   BIOPSY  07/29/2022   Procedure: BIOPSY;  Surgeon: Brice Campi Albino Alu., MD;  Location: [facility];  Service: Gastroenterology;;   CESAREAN SECTION     x 2   COLONOSCOPY WITH PROPOFOL  N/A 07/29/2022   Procedure: COLONOSCOPY WITH PROPOFOL ;  Surgeon: Normie Becton., MD;  Location: [facility];  Service: Gastroenterology;  Laterality: N/A;   ESOPHAGOGASTRODUODENOSCOPY (EGD) WITH PROPOFOL  N/A 07/29/2022   Procedure: ESOPHAGOGASTRODUODENOSCOPY (EGD) WITH PROPOFOL ;  Surgeon: Brice Campi Albino Alu., MD;  Location: [facility];  Service: Gastroenterology;  Laterality: N/A;   ESOPHAGOGASTRODUODENOSCOPY (EGD) WITH PROPOFOL  N/A 09/05/2023   Procedure: ESOPHAGOGASTRODUODENOSCOPY (EGD) WITH PROPOFOL ;  Surgeon: Brice Campi Albino Alu., MD;  Location: [facility];  Service: Gastroenterology;  Laterality: N/A;   EUS N/A 07/29/2022   Procedure: UPPER ENDOSCOPIC ULTRASOUND (EUS) LINEAR;  Surgeon: Normie Becton., MD;  Location: [facility];  Service: Gastroenterology;  Laterality: N/A;   FINE NEEDLE ASPIRATION N/A 07/29/2022   Procedure: FINE NEEDLE ASPIRATION (FNA) LINEAR;  Surgeon: Normie Becton., MD;  Location: [facility];  Service: Gastroenterology;  Laterality: N/A;   FINE NEEDLE ASPIRATION N/A 09/05/2023   Procedure: FINE NEEDLE ASPIRATION (FNA) LINEAR;  Surgeon: Normie Becton., MD;  Location: [facility];  Service: Gastroenterology;  Laterality: N/A;   LAPAROSCOPIC APPENDECTOMY  12/03/2011   Procedure: APPENDECTOMY LAPAROSCOPIC;  Surgeon: Evander Hills, DO;  Location: [facility];  Service: General;  Laterality: N/A;  drainage of intra-abdominal abscess    PARS PLANA VITRECTOMY Left 08/17/2012   Procedure: PARS PLANA VITRECTOMY WITH 25 GAUGE;  Surgeon: Rexene Catching, MD;  Location[facility];  Service: Ophthalmology;  Laterality: Left;   POLYPECTOMY  07/29/2022   Procedure: POLYPECTOMY;  Surgeon: Mansouraty, Albino Alu., MD;  Location: [facility];  Service: Gastroenterology;;   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 She has never used smokeless tobacco. She reports that she does not currently use alcohol after a past usage of about 1.0 standard drink of alcohol per week. She reports that she does not use drugs. Family History:  Family History  Problem Relation Age of Onset   Breast cancer Mother 8   Diabetes Mother    Hypertension Mother    Kidney failure Father    Heart disease Father        No details   Diabetes Sister    Hypertension Sister    Hyperlipidemia Sister    Diabetes Maternal Uncle    Cancer Maternal Uncle        type unknown   Diabetes Paternal Aunt    Hypertension Paternal Aunt    Breast cancer Paternal Aunt    Fibromyalgia Paternal  Aunt    Breast cancer Maternal Grandmother    Hypertension Maternal Grandmother    Kidney disease Maternal Grandfather    Cancer Maternal Grandfather        type unknown   Diabetes Paternal Grandmother    Diabetes Paternal Grandfather    Diabetes Daughter    Colon cancer Neg Hx    Esophageal cancer Neg Hx    Inflammatory bowel disease Neg Hx    Liver disease Neg Hx    Pancreatic cancer Neg Hx    Rectal cancer Neg Hx    Stomach cancer Neg Hx      HOME MEDICATIONS: Allergies as of 12/14/2023       Reactions   Latex Other (See Comments)   Infection in left middle finger from latex gloves due to moisture.   Augmentin  [amoxicillin -pot Clavulanate] Itching   Burning and itching, back pains, elevated sugar levels   Iohexol      Code: RASH, Desc: PATIENT CALLED 06/17/08-STATED AFTER 6 HOURS POST IV CONTRAST, HANDS,SCALP AND GROIN AREA DEVELOPED ITCHING,BURNING SENSATION, Onset Date: [id number]        Medication List        Accurate as of December 14, 2023  9:57 AM. If you have any  questions, ask your nurse or doctor.          Accu-Chek Guide Test test strip Generic drug: glucose blood Testing twice daily.   ascorbic acid  500 MG tablet Commonly known as: VITAMIN C  Take 1,000 mg by mouth daily.   Blood Pressure Monitor Devi Please provide patient with insurance approved blood pressure monitor   cyclobenzaprine  10 MG tablet Commonly known as: FLEXERIL  TAKE 1 TABLET (10 MG TOTAL) BY MOUTH 3 (THREE) TIMES DAILY AS NEEDED FOR MUSCLE SPASMS.   Dexcom G7 Sensor Misc Use to check blood sugar continuously throughout the day. E11.9   diclofenac  Sodium 1 % Gel Commonly known as: VOLTAREN  APPLY 4 G TOPICALLY 4 (FOUR) TIMES DAILY. What changed:  when to take this reasons to take this   fenofibrate  145 MG tablet Commonly known as: TRICOR  Take 1 tablet (145 mg total) by mouth daily.   gabapentin  300 MG capsule Commonly known as: NEURONTIN  Take 1 capsule (300 mg total) by mouth at bedtime.   glipiZIDE  10 MG tablet Commonly known as: Glucotrol  Take 2 tablets (20 mg total) by mouth 2 (two) times daily before a meal. What changed: how much to take   hydrochlorothiazide  25 MG tablet Commonly known as: HYDRODIURIL  Take 1 tablet (25 mg total) by mouth daily.   hyoscyamine  0.125 MG tablet Commonly known as: Levsin Take 1 tablet (0.125 mg total) by mouth every 6 (six) hours as needed for cramping (diarrhea,nausea).   Lantus  SoloStar 100 UNIT/ML Solostar Pen Generic drug: insulin  glargine Inject 40 Units into the skin 2 (two) times daily.   linaclotide  290 MCG Caps capsule Commonly known as: Linzess  Take 1 capsule (290 mcg total) by mouth daily before breakfast.   lisinopril  40 MG tablet Commonly known as: ZESTRIL  Take 1 tablet (40 mg total) by mouth daily.   methimazole  5 MG tablet Commonly known as: TAPAZOLE  Take 1 tablet (5 mg total) by mouth 2 (two) times daily.   Multi-Vitamin tablet Take 1 tablet by mouth daily.   mupirocin  ointment 2  % Commonly known as: BACTROBAN  Apply 1 Application topically daily.   OneTouch Delica Plus Lancet33G Misc Testing twice daily   OneTouch Verio Flex System w/Device Kit Use up to 2 times daily as directed.  pantoprazole  40 MG tablet Commonly known as: PROTONIX  Take 1 tablet (40 mg total) by mouth 2 (two) times daily.   rosuvastatin  5 MG tablet Commonly known as: Crestor  Take 1 tablet (5 mg total) by mouth daily.   TRUEplus 5-Bevel Pen Needles 31G X 5 MM Misc Generic drug: Insulin  Pen Needle Inject as directed in the morning and at bedtime.   Trulicity  0.75 MG/0.5ML Soaj Generic drug: Dulaglutide  Inject 0.75 mg into the skin once a week.   Ventolin  HFA 108 (90 Base) MCG/ACT inhaler Generic drug: albuterol  Inhale 2 puffs into the lungs every 6 (six) hours as needed for wheezing or shortness of breath.   Vitamin D -3 125 MCG (5000 UT) Tabs Take 1 tablet by mouth daily.         ALLERGIES: Allergies  Allergen Reactions   Latex Other (See Comments)    Infection in left middle finger from latex gloves due to moisture.   Augmentin  [Amoxicillin -Pot Clavulanate] Itching    Burning and itching, back pains, elevated sugar levels   Iohexol       Code: RASH, Desc: PATIENT CALLED 06/17/08-STATED AFTER 6 HOURS POST IV CONTRAST, HANDS,SCALP AND GROIN AREA DEVELOPED ITCHING,BURNING SENSATION, Onset Date: [id number]      REVIEW OF SYSTEMS: A comprehensive ROS was conducted with the patient and is negative except as per HPI     OBJECTIVE:   VITAL SIGNS: BP 134/72 (BP Location: Left Arm, Patient Position: Sitting, Cuff Size: Normal)   Pulse 74   Ht 5\' 10"  (1.778 m)   Wt (!) 307 lb (139.3 kg)   LMP 08/09/2012   SpO2 98%   BMI 44.05 kg/m    PHYSICAL EXAM:  General: Pt appears well and is in NAD  Neck: General: Supple without adenopathy or carotid bruits. Thyroid : Thyroid  size normal.  No goiter or nodules appreciated.   Lungs: Clear with good BS bilat   Heart: RRR    Abdomen:  soft, nontender  Extremities:  Lower extremities - No pretibial edema.   Neuro: MS is good with appropriate affect, pt is alert and Ox3    DM foot exam: 03/22/2023 per podiatry    DATA REVIEWED:  Lab Results  Component Value Date   HGBA1C 9.1 (A) 08/23/2023   HGBA1C 9.4 (H) 03/01/2023   HGBA1C 10.1 (A) 10/20/2022    Latest Reference Range & Units 03/01/23 11:49  Sodium 134 - 144 mmol/L 137  Potassium 3.5 - 5.2 mmol/L 4.7  Chloride 96 - 106 mmol/L 98  CO2 20 - 29 mmol/L 23  Glucose 70 - 99 mg/dL 865 (H)  BUN 6 - 24 mg/dL 25 (H)  Creatinine 7.84 - 1.00 mg/dL 6.96 (H)  Calcium  8.7 - 10.2 mg/dL 9.4  BUN/Creatinine Ratio 9 - 23  22  eGFR >59 mL/min/1.73 56 (L)  Alkaline Phosphatase 44 - 121 IU/L 66  Albumin 3.8 - 4.9 g/dL 4.1  AST 0 - 40 IU/L 15  ALT 0 - 32 IU/L 10  Total Protein 6.0 - 8.5 g/dL 6.8  Total Bilirubin 0.0 - 1.2 mg/dL <2.9     Latest Reference Range & Units 04/14/23 15:51  Total CHOL/HDL Ratio 0.0 - 4.4 ratio 2.9  Cholesterol, Total 100 - 199 mg/dL 528  HDL Cholesterol >41 mg/dL 51  Triglycerides 0 - 324 mg/dL 401  VLDL Cholesterol Cal 5 - 40 mg/dL 23  LDL Chol Calc (NIH) 0 - 99 mg/dL 75    Latest Reference Range & Units 03/01/23 11:49  TSH 0.450 - 4.500  uIU/mL 1.560  Thyroxine (T4) 4.5 - 12.0 ug/dL 7.1  Free Thyroxine Index 1.2 - 4.9  1.8  T3 Uptake Ratio 24 - 39 % 25    Thyroid  Ultrasound 10/1/02024  Estimated total number of nodules >/= 1 cm: 5   Number of spongiform nodules >/=  2 cm not described below (TR1): 0   Number of mixed cystic and solid nodules >/= 1.5 cm not described below (TR2): 0   _________________________________________________________   Nodule labeled 1 in the mid right thyroid  appears unchanged, with the variance in measurement secondary to interpretation by the technologist completing the exam. By my measurement, the current 3 dimensions are unchanged when compared to study dated 10/04/2016 with the largest  approximately 21 mm. This nodule underwent prior biopsy 10/20/2016. Assuming benign result no further specific follow-up would be indicated.   Nodule labeled 2 inferior right thyroid , 1.9 cm. This appears to be in a region of heterogeneous thyroid  tissue, with poorly defined borders and is favored to be a pseudo nodule.   Nodule labeled 3 in the mid right thyroid , 1.7 cm is spongiform and does not meet criteria for surveillance.   Nodule labeled 4 superior left thyroid , 2 point 5 cm. This nodule has spongiform characteristics and has undergone prior biopsy 10/20/2016. No further specific follow-up would be indicated.   Nodule labeled 5 in the mid left thyroid , 4.7 cm x 3.3 cm x 3.1 cm. Nodule is TR 4 and meets criteria for biopsy.   No adenopathy   IMPRESSION: Heterogeneous multinodular thyroid  goiter, as above.   Left mid thyroid  nodule (labeled 5, TR 4, 4.7 cm) meets criteria for biopsy, as designated by the newly established ACR TI-RADS criteria, and referral for biopsy is recommended.    FNA left mid nodule 09/23/2023   Clinical History: Nodule labeled 5 in the mid left thyroid , 4.7 cm x 3.3  cm x 3.1 cm. Nodule is TR 4 and meets criteria for biopsy  Specimen Submitted:  A. THYROID , LEFT MID, FINE NEEDLE ASPIRATION:    FINAL MICROSCOPIC DIAGNOSIS:  - Benign follicular nodule (Bethesda category II)   ASSESSMENT / PLAN / RECOMMENDATIONS:   1) Type 2 Diabetes Mellitus, Poorly controlled, With Neuropathic  complications - Most recent A1c of 7.7 %. Goal A1c < 7.0 %.      -A1c is trending down -Intolerant to Metformin   - Intolerant to [employer]  - Intolerant to Ozempic   - She has been taking more glipizide  than previously prescribed, patient has been noted with hypoglycemia, will decrease glipizide  and Lantus  as below - Tolerating Trulicity , will increase the dose as below, patient to monitor for GI symptoms, as she does have underlying issues but does not feel that  Trulicity  has caused any worsening    MEDICATIONS: Increase Trulicity  1.5 mg weekly Take glipizide  10 mg, 2 tabs before breakfast and 2 tabs before supper Decrease Lantus  36 units twice daily  EDUCATION / INSTRUCTIONS: BG monitoring instructions: Patient is instructed to check her blood sugars 3 times a day, before each meal . Call [facility] if: BG persistently < 70  I reviewed the Rule of 15 for the treatment of hypoglycemia in detail with the patient. Literature supplied.   2) Diabetic complications:  Eye: Does not have known diabetic retinopathy.  Neuro/ Feet: Does have known diabetic peripheral neuropathy. Renal: Patient does not have known baseline CKD. She is  on an ACEI/ARB at present.  3) Hyperthyroidism  -Patient is clinically euthyroid -Tolerating methimazole  -No change  Medication Continue methimazole  5 mg twice daily  4) MNG:   -No local neck symptoms -She is s/p FNA of multiple nodules in the past - She is s/p benign FNA of the left mid nodule 09/2023  Follow-up in 4 months    Signed electronically by: Natale Bail, MD  [facility]  [facility] [address] [place] [place], [place] [address] Phone: [phone] FAX: [phone]   CC: Collins Dean, NP [address] [place] [address] Phone: [phone]  Fax: [phone]    Return to Endocrinology clinic as below: Future Appointments  Date Time Provider Department Center  01/25/2024  3:00 PM Luella Sager, DPM [facility]  02/17/2024  3:10 PM Collins Dean, NP [facility] None

## 2023-12-15 ENCOUNTER — Other Ambulatory Visit: Payer: Self-pay

## 2023-12-15 ENCOUNTER — Encounter: Payer: Self-pay | Admitting: Hematology

## 2023-12-15 LAB — MICROALBUMIN / CREATININE URINE RATIO
Creatinine, Urine: 80 mg/dL (ref 20–275)
Microalb Creat Ratio: 4 mg/g{creat} (ref <30)
Microalb, Ur: 0.3 mg/dL

## 2023-12-15 MED ORDER — DICLOFENAC SODIUM 1 % EX GEL
4.0000 g | Freq: Four times a day (QID) | CUTANEOUS | 2 refills | Status: DC
  Filled 2023-12-15 – 2024-01-29 (×2): qty 300, 18d supply, fill #0

## 2023-12-15 NOTE — Telephone Encounter (Signed)
 Pharmacy Patient Advocate Encounter  Received notification from  MEDICAID that Prior Authorization for Trulicity  1.5MG /0.5ML auto-injectors  has been APPROVED from 12/14/2023 to 12/13/2024. Ran test claim, Copay is $4.00. This test claim was processed through [facility]- copay amounts may vary at other pharmacies due to pharmacy/plan contracts, or as the patient moves through the different stages of their insurance plan.   PA #/Case ID/Reference #: [id number]

## 2023-12-16 ENCOUNTER — Ambulatory Visit: Payer: Self-pay | Admitting: Internal Medicine

## 2023-12-19 ENCOUNTER — Other Ambulatory Visit: Payer: Self-pay

## 2023-12-20 ENCOUNTER — Other Ambulatory Visit: Payer: Self-pay | Admitting: Nurse Practitioner

## 2023-12-20 ENCOUNTER — Ambulatory Visit: Payer: Self-pay | Admitting: Nurse Practitioner

## 2023-12-20 DIAGNOSIS — R143 Flatulence: Secondary | ICD-10-CM

## 2023-12-20 MED ORDER — SIMETHICONE 80 MG PO CHEW
80.0000 mg | CHEWABLE_TABLET | Freq: Four times a day (QID) | ORAL | 1 refills | Status: AC | PRN
Start: 2023-12-20 — End: ?
  Filled 2023-12-20: qty 30, 8d supply, fill #0

## 2023-12-21 ENCOUNTER — Ambulatory Visit: Payer: Medicare Other | Admitting: Internal Medicine

## 2023-12-21 ENCOUNTER — Other Ambulatory Visit: Payer: Self-pay

## 2023-12-26 ENCOUNTER — Other Ambulatory Visit: Payer: Self-pay

## 2024-01-23 ENCOUNTER — Other Ambulatory Visit: Payer: Self-pay

## 2024-01-24 ENCOUNTER — Other Ambulatory Visit: Payer: Self-pay

## 2024-01-24 ENCOUNTER — Other Ambulatory Visit: Payer: Self-pay | Admitting: Nurse Practitioner

## 2024-01-24 DIAGNOSIS — K219 Gastro-esophageal reflux disease without esophagitis: Secondary | ICD-10-CM

## 2024-01-24 MED ORDER — PANTOPRAZOLE SODIUM 40 MG PO TBEC
40.0000 mg | DELAYED_RELEASE_TABLET | Freq: Two times a day (BID) | ORAL | 0 refills | Status: DC
  Filled 2024-01-24: qty 60, 30d supply, fill #0
  Filled 2024-03-08: qty 60, 30d supply, fill #1
  Filled 2024-04-10: qty 60, 30d supply, fill #2

## 2024-01-25 ENCOUNTER — Ambulatory Visit (INDEPENDENT_AMBULATORY_CARE_PROVIDER_SITE_OTHER): Admitting: Podiatry

## 2024-01-25 ENCOUNTER — Other Ambulatory Visit: Payer: Self-pay

## 2024-01-25 ENCOUNTER — Encounter: Payer: Self-pay | Admitting: Podiatry

## 2024-01-25 DIAGNOSIS — M79675 Pain in left toe(s): Secondary | ICD-10-CM

## 2024-01-25 DIAGNOSIS — M79674 Pain in right toe(s): Secondary | ICD-10-CM

## 2024-01-25 DIAGNOSIS — L84 Corns and callosities: Secondary | ICD-10-CM | POA: Diagnosis not present

## 2024-01-25 DIAGNOSIS — B351 Tinea unguium: Secondary | ICD-10-CM | POA: Diagnosis not present

## 2024-01-25 DIAGNOSIS — E1142 Type 2 diabetes mellitus with diabetic polyneuropathy: Secondary | ICD-10-CM

## 2024-01-26 ENCOUNTER — Telehealth: Payer: Self-pay | Admitting: Gastroenterology

## 2024-01-26 NOTE — Telephone Encounter (Signed)
 PT wishes to speak with a nurse regarding her symptoms. She would not leave any details. She stated she needs to be called today because time is of the essence.

## 2024-01-26 NOTE — Telephone Encounter (Signed)
 The pt states that she has chronic constipation with abd bloating. Would like to be seen to discuss.  Appt made for 7/18- due to cancellation.    She agrees and accepts appt.

## 2024-01-26 NOTE — Telephone Encounter (Signed)
 The line rings busy will attempt later

## 2024-01-27 ENCOUNTER — Ambulatory Visit (INDEPENDENT_AMBULATORY_CARE_PROVIDER_SITE_OTHER): Admitting: Gastroenterology

## 2024-01-27 ENCOUNTER — Encounter: Payer: Self-pay | Admitting: Gastroenterology

## 2024-01-27 VITALS — BP 130/60 | HR 88 | Ht 68.0 in | Wt 301.2 lb

## 2024-01-27 DIAGNOSIS — R140 Abdominal distension (gaseous): Secondary | ICD-10-CM

## 2024-01-27 DIAGNOSIS — K5909 Other constipation: Secondary | ICD-10-CM

## 2024-01-27 DIAGNOSIS — R109 Unspecified abdominal pain: Secondary | ICD-10-CM

## 2024-01-27 DIAGNOSIS — Z6841 Body Mass Index (BMI) 40.0 and over, adult: Secondary | ICD-10-CM

## 2024-01-27 DIAGNOSIS — K581 Irritable bowel syndrome with constipation: Secondary | ICD-10-CM

## 2024-01-27 DIAGNOSIS — K229 Disease of esophagus, unspecified: Secondary | ICD-10-CM

## 2024-01-27 NOTE — Progress Notes (Signed)
 GASTROENTEROLOGY OUTPATIENT CLINIC VISIT   Primary Care Provider Theotis Haze ORN, NP [address] [place] [address] [place] [address] [phone]  Patient Profile: Kristina Adams is a 58 y.o. 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 pylori, colon polyps (TA's), chronic constipation.  The patient presents to the [facility] for an evaluation and management of problem(s) noted below:  Problem List 1. Chronic constipation   2. Irritable bowel syndrome with constipation   3. Abdominal cramping   4. Abdominal bloating   5. Nodule of esophagus   6. Morbid obesity (HCC)    Discussed the use of AI scribe software for clinical note transcription with the patient, who gave verbal consent to proceed.  History of Present Illness Please see prior GI notes for full details of HPI.  Interval History Kristina Adams is a 58 year old female who presents for follow-up of Constipation and bloating and abdominal pain.  Linzess , initially effective for her constipation, is no longer providing the same relief.  She is needing to take OTC laxative ([employer] brand) 4 pills a day to help move her bowels while still taking Linzess  290.  She will have a bowel movement every two to three days with pellet-like stools.  She experiences a sensation of having to push during bowel movements, which she wants to avoid preventing hemorrhoids.  She has a history of a hiatal hernia, which was previously identified during an endoscopy. She experiences discomfort and a sensation of something being stuck, especially when needing to belch. Relief is found by drinking carbonated diet soda and adjusting her position laterally, such as turning slightly in her chair.  There has been a recent increase in  her Trulicity  dosage which could be contributing to her symptoms as well.  It does decrease appetite and is helping her in her weight loss journey.   GI Review of Systems Positive as above Negative for dysphagia, odynophagia, nausea, vomiting, melena, hematochezia  Review of Systems General: Denies fevers/chills/weight loss unintentionally Cardiovascular: Denies chest pain Pulmonary: Denies shortness of breath Gastroenterological: See HPI Genitourinary: Denies darkened urine  Hematological: Denies easy bruising/bleeding Dermatological: Denies jaundice Psychological: Mood is stable   Medications Current Outpatient Medications  Medication Sig Dispense Refill   albuterol  (VENTOLIN  HFA) 108 (90 Base) MCG/ACT inhaler Inhale 2 puffs into the lungs every 6 (six) hours as needed for wheezing or shortness of breath. (Patient taking differently: Inhale 2 puffs into the lungs every 6 (six) hours as needed for wheezing or shortness of breath. During allergy season) 18 g 0   blood glucose meter kit and supplies KIT Use up to 2 times daily as directed. 1 each 0   Blood Pressure Monitor DEVI Please provide patient with insurance approved blood pressure monitor 1 each 0   Cholecalciferol  (VITAMIN D -3) 125 MCG (5000 UT) TABS Take 1 tablet by mouth daily. 90 tablet 3   Continuous Glucose Sensor (DEXCOM G7 SENSOR) MISC Use to check blood sugar continuously throughout the day. E11.9 9 each 2   cyclobenzaprine  (FLEXERIL ) 10 MG tablet TAKE 1 TABLET (10 MG TOTAL) BY MOUTH 3 (THREE) TIMES DAILY AS NEEDED FOR MUSCLE SPASMS. 90 tablet 2   diclofenac  Sodium (VOLTAREN ) 1 % GEL APPLY 4 G TOPICALLY TO AFFECTED AREA 4 (FOUR) TIMES DAILY. 300 g 2   Dulaglutide  (TRULICITY ) 1.5 MG/0.5ML SOAJ Inject  1.5 mg into the skin once a week. 6 mL 3   fenofibrate  (TRICOR ) 145 MG tablet Take 1 tablet (145 mg total) by mouth daily. 90 tablet 1   gabapentin  (NEURONTIN ) 300 MG capsule Take 1 capsule (300 mg total) by mouth at bedtime.  270 capsule 2   glipiZIDE  (GLUCOTROL ) 10 MG tablet Take 2 tablets (20 mg total) by mouth 2 (two) times daily before a meal. 360 tablet 2   glucose blood (ONETOUCH VERIO) test strip Testing twice daily. 200 each 0   hydrochlorothiazide  (HYDRODIURIL ) 25 MG tablet Take 1 tablet (25 mg total) by mouth daily. 90 tablet 0   hyoscyamine  (LEVSIN ) 0.125 MG tablet Take 1 tablet (0.125 mg total) by mouth every 6 (six) hours as needed for cramping (diarrhea,nausea). 60 tablet 1   insulin  glargine (LANTUS  SOLOSTAR) 100 UNIT/ML Solostar Pen Inject 36 Units into the skin 2 (two) times daily. 75 mL 4   Insulin  Pen Needle (B-D UF III MINI PEN NEEDLES) 31G X 5 MM MISC Inject as directed in the morning and at bedtime. 200 each 3   Lancets (ONETOUCH DELICA PLUS LANCET33G) MISC Testing twice daily 200 each 1   linaclotide  (LINZESS ) 290 MCG CAPS capsule Take 1 capsule (290 mcg total) by mouth daily before breakfast. 90 capsule 2   lisinopril  (ZESTRIL ) 40 MG tablet Take 1 tablet (40 mg total) by mouth daily. 90 tablet 0   methimazole  (TAPAZOLE ) 5 MG tablet Take 1 tablet (5 mg total) by mouth 2 (two) times daily. 180 tablet 3   Multiple Vitamin (MULTI-VITAMIN) tablet Take 1 tablet by mouth daily.     pantoprazole  (PROTONIX ) 40 MG tablet Take 1 tablet (40 mg total) by mouth 2 (two) times daily. 180 tablet 0   rosuvastatin  (CRESTOR ) 5 MG tablet Take 1 tablet (5 mg total) by mouth daily. 90 tablet 0   simethicone  (MYLICON) 80 MG chewable tablet Chew 1 tablet (80 mg total) by mouth every 6 (six) hours as needed for flatulence. 30 tablet 1   vitamin C  (ASCORBIC ACID ) 500 MG tablet Take 1,000 mg by mouth daily.     No current facility-administered medications for this visit.    Allergies Allergies  Allergen Reactions   Latex Other (See Comments)    Infection in left middle finger from latex gloves due to moisture.   Augmentin  [Amoxicillin -Pot Clavulanate] Itching    Burning and itching, back pains, elevated sugar levels    Iohexol       Code: RASH, Desc: PATIENT CALLED 06/17/08-STATED AFTER 6 HOURS POST IV CONTRAST, HANDS,SCALP AND GROIN AREA DEVELOPED ITCHING,BURNING SENSATION, Onset Date: [id number]     Histories Past Medical History:  Diagnosis Date   Anemia 12/09/2011   child birth   Anxiety    Appendicitis, acute 12/03/2011   Arthritis    Chronic back pain 12/03/2011   Degenerative disc disease, cervical    low back area   Diabetes mellitus type 2, insulin  dependent (HCC) 12/03/2011   GERD (gastroesophageal reflux disease)    H/O hiatal hernia    HLD (hyperlipidemia)    Hypertension 12/03/2011   Hyperthyroidism    IBS (irritable bowel syndrome)    Morbid obesity with BMI of 45.0-49.9, adult (HCC) 12/03/2011   Sleep apnea 12/03/2011   last sleep study May 2013   Past Surgical History:  Procedure Laterality Date   BIOPSY  07/29/2022   Procedure: BIOPSY;  Surgeon: Wilhelmenia Aloha Raddle., MD;  Location: [facility];  Service: Gastroenterology;;   CESAREAN SECTION  x 2   COLONOSCOPY WITH PROPOFOL  N/A 07/29/2022   Procedure: COLONOSCOPY WITH PROPOFOL ;  Surgeon: Wilhelmenia Aloha Raddle., MD;  Location: [facility];  Service: Gastroenterology;  Laterality: N/A;   ESOPHAGOGASTRODUODENOSCOPY (EGD) WITH PROPOFOL  N/A 07/29/2022   Procedure: ESOPHAGOGASTRODUODENOSCOPY (EGD) WITH PROPOFOL ;  Surgeon: Wilhelmenia Aloha Raddle., MD;  Location: [facility];  Service: Gastroenterology;  Laterality: N/A;   ESOPHAGOGASTRODUODENOSCOPY (EGD) WITH PROPOFOL  N/A 09/05/2023   Procedure: ESOPHAGOGASTRODUODENOSCOPY (EGD) WITH PROPOFOL ;  Surgeon: Wilhelmenia Aloha Raddle., MD;  Location: [facility];  Service: Gastroenterology;  Laterality: N/A;   EUS N/A 07/29/2022   Procedure: UPPER ENDOSCOPIC ULTRASOUND (EUS) LINEAR;  Surgeon: Wilhelmenia Aloha Raddle., MD;  Location: [facility];  Service: Gastroenterology;  Laterality: N/A;   FINE NEEDLE ASPIRATION N/A 07/29/2022   Procedure: FINE NEEDLE ASPIRATION (FNA) LINEAR;   Surgeon: Wilhelmenia Aloha Raddle., MD;  Location: [facility];  Service: Gastroenterology;  Laterality: N/A;   FINE NEEDLE ASPIRATION N/A 09/05/2023   Procedure: FINE NEEDLE ASPIRATION (FNA) LINEAR;  Surgeon: Wilhelmenia Aloha Raddle., MD;  Location: [facility];  Service: Gastroenterology;  Laterality: N/A;   LAPAROSCOPIC APPENDECTOMY  12/03/2011   Procedure: APPENDECTOMY LAPAROSCOPIC;  Surgeon: Redell Faith, DO;  Location: [facility];  Service: General;  Laterality: N/A;  drainage of intra-abdominal abscess    PARS PLANA VITRECTOMY Left 08/17/2012   Procedure: PARS PLANA VITRECTOMY WITH 25 GAUGE;  Surgeon: Norleen JONETTA Ku, MD;  Location: [facility];  Service: Ophthalmology;  Laterality: Left;   POLYPECTOMY  07/29/2022   Procedure: POLYPECTOMY;  Surgeon: Mansouraty, Aloha Raddle., MD;  Location: [facility];  Service: Gastroenterology;;   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   Procedure: REPAIR OF COMPLEX TRACTION RETINAL DETACHMENT;  Surgeon: Norleen JONETTA Ku, MD;  Location: [facility];  Service: Ophthalmology;  Laterality: Left;   TUBAL LIGATION     UPPER ESOPHAGEAL ENDOSCOPIC ULTRASOUND (EUS) N/A 09/05/2023   Procedure: UPPER ESOPHAGEAL ENDOSCOPIC ULTRASOUND (EUS);  Surgeon: Wilhelmenia Aloha Raddle., MD;  Location: [facility];  Service: Gastroenterology;  Laterality: N/A;   Social History   Socioeconomic History   Marital status: Divorced    Spouse name: Not on file   Number of children: 2   Years of education: Not on file   Highest education level: Not on file  Occupational History   Not on file  Tobacco Use   Smoking status: Some Days    Types: Cigars   Smokeless tobacco: Never  Vaping Use   Vaping status: Never Used  Substance and Sexual Activity   Alcohol use: Not Currently    Alcohol/week: 1.0 standard drink of alcohol    Types: 1 Glasses of wine per week    Comment: occasional   Drug use: No   Sexual activity: Yes    Birth control/protection: Pill  Other Topics  Concern   Not on file  Social History Narrative   Lives alone.    Social Drivers of Health   Financial Resource Strain: Patient Declined (05/16/2023)   Overall Financial Resource Strain (CARDIA)    Difficulty of Paying Living Expenses: Patient declined  Food Insecurity: No Food Insecurity (05/16/2023)   Hunger Vital Sign    Worried About Running Out of Food in the Last Year: Never true    Ran Out of Food in the Last Year: Never true  Transportation Needs: No Transportation Needs (05/16/2023)   PRAPARE - Administrator, Civil Service (Medical): No    Lack of Transportation (Non-Medical): No  Physical Activity: Patient Declined (05/16/2023)  Exercise Vital Sign    Days of Exercise per Week: Patient declined    Minutes of Exercise per Session: Patient declined  Stress: No Stress Concern Present (05/16/2023)   [employer] of Occupational Health - Occupational Stress Questionnaire    Feeling of Stress : Not at all  Social Connections: Patient Declined (05/16/2023)   Social Connection and Isolation Panel    Frequency of Communication with Friends and Family: Patient declined    Frequency of Social Gatherings with Friends and Family: Patient declined    Attends Religious Services: Patient declined    Database administrator or Organizations: Patient declined    Attends Banker Meetings: Patient declined    Marital Status: Patient declined  Intimate Partner Violence: Not At Risk (05/16/2023)   Humiliation, Afraid, Rape, and Kick questionnaire    Fear of Current or Ex-Partner: No    Emotionally Abused: No    Physically Abused: No    Sexually Abused: No   Family History  Problem Relation Age of Onset   Breast cancer Mother 63   Diabetes Mother    Hypertension Mother    Kidney failure Father    Heart disease Father        No details   Diabetes Sister    Hypertension Sister    Hyperlipidemia Sister    Diabetes Maternal Uncle    Cancer Maternal Uncle         type unknown   Diabetes Paternal Aunt    Hypertension Paternal Aunt    Breast cancer Paternal Aunt    Fibromyalgia Paternal Aunt    Breast cancer Maternal Grandmother    Hypertension Maternal Grandmother    Kidney disease Maternal Grandfather    Cancer Maternal Grandfather        type unknown   Diabetes Paternal Grandmother    Diabetes Paternal Grandfather    Diabetes Daughter    Colon cancer Neg Hx    Esophageal cancer Neg Hx    Inflammatory bowel disease Neg Hx    Liver disease Neg Hx    Pancreatic cancer Neg Hx    Rectal cancer Neg Hx    Stomach cancer Neg Hx    I have reviewed her medical, social, and family history in detail and updated the electronic medical record as necessary.    PHYSICAL EXAMINATION  BP 130/60 (BP Location: Left Arm, Patient Position: Sitting, Cuff Size: Large)   Pulse 88   Ht 5' 8 (1.727 m) Comment: height measured without shoes  Wt (!) 301 lb 4 oz (136.6 kg)   LMP 08/09/2012   BMI 45.80 kg/m  Wt Readings from Last 3 Encounters:  01/27/24 (!) 301 lb 4 oz (136.6 kg)  12/14/23 (!) 307 lb (139.3 kg)  11/18/23 (!) 305 lb (138.3 kg)  GEN: NAD, appears stated age, doesn't appear chronically ill PSYCH: Cooperative, without pressured speech EYE: Conjunctivae pink, sclerae anicteric ENT: MMM CV: Nontachycardic RESP: No audible wheezing GI: NABS, soft, rounded, protuberant, obese, NT, no rebound MSK/EXT: Bilateral pedal edema present SKIN: No jaundice NEURO:  Alert & Oriented x 3, no focal deficits   REVIEW OF DATA  I reviewed the following data at the time of this encounter:  GI Procedures and Studies  No new imaging studies to review  Laboratory Studies  Reviewed those in epic  Imaging Studies  No new imaging studies to review   ASSESSMENT  Ms. Lienhard is a 58 y.o. 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 pylori, colon polyps (TA's), chronic constipation.  The patient is seen today for evaluation and management of:  1. Chronic constipation   2. Irritable bowel syndrome with constipation   3. Abdominal cramping   4. Abdominal bloating   5. Nodule of esophagus   6. Morbid obesity (HCC)    The patient is hemodynamically stable.  Still experiencing issues with chronic constipation which have worsened when she had been doing better with Linzess  290 mcg.  Will plan to give patient samples of Trulance and Ibsrela to see if there may be a difference or improvement in her symptomatology.  She will let us  know.  Will plan EUS upper follow-up for esophageal leiomyoma in 2 years and likely discontinue surveillance from there.  All patient questions were answered to the best of my ability, and the patient agrees to the aforementioned plan of action with follow-up as indicated.   PLAN  Continue PPI twice daily -Take 30 minutes before meals Moding as needed Repeat upper EUS in 2025 for hopeful final surveillance Continue Fiber daily to twice daily Stop Linzess  290 mcg daily  Trial Trulance versus Ibsrela - Hopefully this will help Toileting instructions discussed Consider SIBO breath testing in future Will consider anorectal manometry in future   No orders of the defined types were placed in this encounter.   New Prescriptions   No medications on file   Modified Medications   No medications on file    Planned Follow Up No follow-ups on file.   Total Time in Face-to-Face and in Coordination of Care for patient including independent/personal interpretation/review of prior testing, medical history, examination, medication adjustment, communicating results with the patient directly, and documentation within the EHR is 25 minutes.   Aloha Finner, MD [facility] Office # [phone]

## 2024-01-27 NOTE — Patient Instructions (Addendum)
 You have been given samples of Trulance and Ibrela.   Trulance 3 mg - 1 tablet by mouth once daily  OR   Ibsrela 50 mg- Take 1 tablet by mouth twice daily.   Please let us  know which medication works for you.   You may also continue Miralax 1 capful once daily as needed.   Follow-up in 2 month with : Camie Furbish, PA-C at 3:00 pm.   _______________________________________________________  If your blood pressure at your visit was 140/90 or greater, please contact your primary care physician to follow up on this.  _______________________________________________________  If you are age 58 or older, your body mass index should be between 23-30. Your Body mass index is 45.8 kg/m. If this is out of the aforementioned range listed, please consider follow up with your Primary Care Provider.  If you are age 63 or younger, your body mass index should be between 19-25. Your Body mass index is 45.8 kg/m. If this is out of the aformentioned range listed, please consider follow up with your Primary Care Provider.   ________________________________________________________  The [facility] providers would like to encourage you to use MYCHART to communicate with providers for non-urgent requests or questions.  Due to long hold times on the telephone, sending your provider a message by [facility] may be a faster and more efficient way to get a response.  Please allow 48 business hours for a response.  Please remember that this is for non-urgent requests.  _______________________________________________________  Thank you for choosing me and [facility].  Dr. Wilhelmenia

## 2024-01-29 ENCOUNTER — Encounter: Payer: Self-pay | Admitting: Gastroenterology

## 2024-01-29 ENCOUNTER — Other Ambulatory Visit: Payer: Self-pay | Admitting: Family Medicine

## 2024-01-29 DIAGNOSIS — E785 Hyperlipidemia, unspecified: Secondary | ICD-10-CM

## 2024-01-29 DIAGNOSIS — R109 Unspecified abdominal pain: Secondary | ICD-10-CM | POA: Insufficient documentation

## 2024-01-29 DIAGNOSIS — K581 Irritable bowel syndrome with constipation: Secondary | ICD-10-CM | POA: Insufficient documentation

## 2024-01-29 DIAGNOSIS — I10 Essential (primary) hypertension: Secondary | ICD-10-CM

## 2024-01-30 ENCOUNTER — Other Ambulatory Visit: Payer: Self-pay

## 2024-01-30 ENCOUNTER — Encounter: Payer: Self-pay | Admitting: Hematology

## 2024-01-31 ENCOUNTER — Other Ambulatory Visit: Payer: Self-pay

## 2024-01-31 MED ORDER — LISINOPRIL 40 MG PO TABS
40.0000 mg | ORAL_TABLET | Freq: Every day | ORAL | 0 refills | Status: DC
  Filled 2024-01-31: qty 90, 90d supply, fill #0

## 2024-01-31 MED ORDER — ROSUVASTATIN CALCIUM 5 MG PO TABS
5.0000 mg | ORAL_TABLET | Freq: Every day | ORAL | 0 refills | Status: DC
  Filled 2024-01-31: qty 90, 90d supply, fill #0

## 2024-01-31 MED ORDER — HYDROCHLOROTHIAZIDE 25 MG PO TABS
25.0000 mg | ORAL_TABLET | Freq: Every day | ORAL | 0 refills | Status: DC
  Filled 2024-01-31: qty 90, 90d supply, fill #0

## 2024-01-31 NOTE — Progress Notes (Signed)
 Subjective:  Patient ID: Kristina Adams, female    DOB: 10-05-1965,  MRN: [id number]  57 y.o. female presents at risk foot care with history of diabetic neuropathy and callus(es) of both feet and painful mycotic toenails that are difficult to trim. Painful toenails interfere with ambulation. Aggravating factors include wearing enclosed shoe gear. Pain is relieved with periodic professional debridement. Painful calluses are aggravated when weightbearing with and without shoegear. Pain is relieved with periodic professional debridement.  Chief Complaint  Patient presents with   [facility]    Rm15 Diabetic/Dr.Fleming last visit May 2025   New problem(s): None   PCP is Theotis Haze ORN, NP. Allergies  Allergen Reactions   Latex Other (See Comments)    Infection in left middle finger from latex gloves due to moisture.   Augmentin  [Amoxicillin -Pot Clavulanate] Itching    Burning and itching, back pains, elevated sugar levels   Iohexol       Code: RASH, Desc: PATIENT CALLED 06/17/08-STATED AFTER 6 HOURS POST IV CONTRAST, HANDS,SCALP AND GROIN AREA DEVELOPED ITCHING,BURNING SENSATION, Onset Date: 87957990     Review of Systems: Negative except as noted in the HPI.   Objective:  Kristina Adams is a pleasant 58 y.o. female morbidly obese in NAD. AAO x 3.  Vascular Examination: Vascular status intact b/l with palpable pedal pulses. CFT immediate b/l. Pedal hair present. No edema. No pain with calf compression b/l. Skin temperature gradient WNL b/l. No varicosities noted. No cyanosis or clubbing noted.  Neurological Examination: Sensation grossly intact b/l with 10 gram monofilament. Pt has subjective symptoms of neuropathy.  Dermatological Examination: Pedal skin with normal turgor, texture and tone b/l. No open wounds nor interdigital macerations noted. Toenails 1-5 b/l thick, discolored, elongated with subungual debris and pain on dorsal palpation.   Hyperkeratotic lesion(s) medial IPJ of right  great toe and submet head 5 left foot.  No erythema, no edema, no drainage, no fluctuance.   Musculoskeletal Examination: Muscle strength 5/5 to b/l LE.  No pain, crepitus noted b/l. No gross pedal deformities. Patient ambulates independently without assistive aids.   Radiographs: None  Last A1c:      Latest Ref Rng & Units 12/14/2023    9:59 AM 08/23/2023    1:38 PM 03/01/2023   11:49 AM  Hemoglobin A1C  Hemoglobin-A1c 4.0 - 5.6 % 7.7  9.1  9.4      Assessment:   1. Pain due to onychomycosis of toenails of both feet   2. Callus   3. Type 2 diabetes mellitus with polyneuropathy (HCC)    Plan:  Consent given for treatment. Patient examined. All patient's and/or POA's questions/concerns addressed on today's visit. Mycotic toenails 1-5 debrided in length and girth without incident. Callus(es) medial IPJ of right great toe and submet head 5 left foot pared with sharp debridement without incident. Continue foot and shoe inspections daily. Monitor blood glucose per PCP/Endocrinologist's recommendations.Continue soft, supportive shoe gear daily. Report any pedal injuries to medical professional. Call office if there are any quesitons/concerns. Return in about 3 months (around 04/26/2024).  Delon LITTIE Merlin, DPM      [facility] LOCATION: [address] [address]  Office [phone]   [facility] LOCATION: [address] [place], [place] [address] Office [phone]

## 2024-02-06 ENCOUNTER — Other Ambulatory Visit: Payer: Self-pay

## 2024-02-07 ENCOUNTER — Other Ambulatory Visit: Payer: Self-pay

## 2024-02-07 ENCOUNTER — Encounter: Payer: Self-pay | Admitting: Gastroenterology

## 2024-02-07 ENCOUNTER — Encounter: Payer: Self-pay | Admitting: Hematology

## 2024-02-07 ENCOUNTER — Other Ambulatory Visit (HOSPITAL_COMMUNITY): Payer: Self-pay

## 2024-02-10 ENCOUNTER — Encounter: Payer: Self-pay | Admitting: Hematology

## 2024-02-14 ENCOUNTER — Other Ambulatory Visit: Payer: Self-pay

## 2024-02-14 NOTE — Telephone Encounter (Signed)
 I think it is worth considering Amitiza . I would do the dose of 24 mcg twice daily. We do not have samples of this. If she would like to trial this, we can give her a prescription/sending a prescription for 2 weeks worth of therapy in case she wants to try this. Amitiza  24 mcg twice daily (28/0).  Start initially with 1 pill daily for 2 days then may increase to twice daily. Diagnosis chronic idiopathic constipation, IBS constipation. Thanks. GM

## 2024-02-15 ENCOUNTER — Telehealth: Payer: Self-pay | Admitting: Nurse Practitioner

## 2024-02-15 ENCOUNTER — Other Ambulatory Visit: Payer: Self-pay

## 2024-02-15 MED ORDER — LUBIPROSTONE 24 MCG PO CAPS
24.0000 ug | ORAL_CAPSULE | Freq: Two times a day (BID) | ORAL | 0 refills | Status: DC
  Filled 2024-02-15: qty 28, 14d supply, fill #0

## 2024-02-15 NOTE — Telephone Encounter (Signed)
 Pt confirmed appt 8/6

## 2024-02-15 NOTE — Addendum Note (Signed)
 Addended by: ANITRA ODETTA CROME on: 02/15/2024 10:13 AM   Modules accepted: Orders

## 2024-02-17 ENCOUNTER — Other Ambulatory Visit: Payer: Self-pay

## 2024-02-17 ENCOUNTER — Ambulatory Visit: Attending: Nurse Practitioner | Admitting: Nurse Practitioner

## 2024-02-17 ENCOUNTER — Encounter: Payer: Self-pay | Admitting: Nurse Practitioner

## 2024-02-17 VITALS — BP 126/69 | HR 76 | Resp 19 | Ht 68.0 in | Wt 305.6 lb

## 2024-02-17 DIAGNOSIS — I10 Essential (primary) hypertension: Secondary | ICD-10-CM

## 2024-02-17 DIAGNOSIS — Z1231 Encounter for screening mammogram for malignant neoplasm of breast: Secondary | ICD-10-CM | POA: Diagnosis not present

## 2024-02-17 DIAGNOSIS — M25562 Pain in left knee: Secondary | ICD-10-CM

## 2024-02-17 NOTE — Progress Notes (Signed)
 Assessment & Plan:  Kristina Adams was seen today for hypertension and knee injury.  Diagnoses and all orders for this visit:  Primary hypertension Blood pressure is well controlled today.  Continue all antihypertensives as prescribed.  Reminded to bring in blood pressure log for follow  up appointment.  RECOMMENDATIONS: DASH/Mediterranean Diets are healthier choices for HTN.    Acute pain of left knee -     DG Knee Complete 4 Views Left; Future Left knee pain after fall Pain likely due to soft tissue injury. Differential includes fracture or fluid accumulation, though less likely given ability to bear weight. - Order x-ray of left knee to rule out fracture or fluid accumulation. - Advise use of over-the-counter Voltaren  gel for pain management. - Continue icing and elevating the knee.  Breast cancer screening by mammogram -     MM 3D SCREENING MAMMOGRAM BILATERAL BREAST; Future   General Health Maintenance Routine health maintenance discussed, including mammogram screening. - Order mammogram screening.  Patient has been counseled on age-appropriate routine health concerns for screening and prevention. These are reviewed and up-to-date. Referrals have been placed accordingly. Immunizations are up-to-date or declined.    Subjective:   Chief Complaint  Patient presents with   Hypertension   Knee Injury    Soreness with bending and or certain movements.     History of Present Illness Kristina Adams is a 58 year old female who presents with left knee pain following a fall.  She has a past medical history of Anemia (12/09/2011), Anxiety, Appendicitis, acute (12/03/2011), Arthritis, Chronic back pain (12/03/2011), Degenerative disc disease, cervical, DM2 (12/03/2011), GERD, H/O hiatal hernia, HLD, Hypertension (12/03/2011), Hyperthyroidism, IBS, Morbid obesity with BMI of 45.0-49.9, adult (HCC) (12/03/2011), and Sleep apnea (12/03/2011).   She is followed by Endocrinology for her DM    She recently fell 2 weeks ago after stepping down from a curb. Since then she has been experiencing pain in her left knee, the shin area and behind the knee. Aggravating factors: Bending the knee, sitting or standing for long periods    She has been using diclofenac  cream for pain relief, but her insurance no longer covers it as it is available over the counter. She has been icing and elevating the knee and continues to walk to prevent stiffness, although she is avoiding outdoor exercises. The diclofenac  cream was helpful in the past.  She has diabetes and monitors her blood sugar levels closely using a Dexcom device. She has noticed fluctuations, particularly when exposed to heat or sunlight, but her blood sugar levels have been stable with occasional lows, such as a reading of 45 mg/dL during the night.  She has been experiencing gastrointestinal issues and has tried various medications, including Linzess  and Trulance, but has had adverse effects such as cramping. She is currently trying a new medication, IBSRELA.  Her uncle recently passed away in a car accident in [place] , which has been emotionally challenging for her and her family.    Review of Systems  Constitutional:  Negative for fever, malaise/fatigue and weight loss.  HENT: Negative.  Negative for nosebleeds.   Eyes: Negative.  Negative for blurred vision, double vision and photophobia.  Respiratory: Negative.  Negative for cough and shortness of breath.   Cardiovascular: Negative.  Negative for chest pain, palpitations and leg swelling.  Gastrointestinal:  Positive for constipation. Negative for heartburn, nausea and vomiting.  Musculoskeletal:  Positive for joint pain. Negative for myalgias.  Neurological: Negative.  Negative for dizziness, focal  weakness, seizures and headaches.  Psychiatric/Behavioral: Negative.  Negative for suicidal ideas.     Past Medical History:  Diagnosis Date   Anemia 12/09/2011   child birth    Anxiety    Appendicitis, acute 12/03/2011   Arthritis    Chronic back pain 12/03/2011   Degenerative disc disease, cervical    low back area   Diabetes mellitus type 2, insulin  dependent (HCC) 12/03/2011   GERD (gastroesophageal reflux disease)    H/O hiatal hernia    HLD (hyperlipidemia)    Hypertension 12/03/2011   Hyperthyroidism    IBS (irritable bowel syndrome)    Morbid obesity with BMI of 45.0-49.9, adult (HCC) 12/03/2011   Sleep apnea 12/03/2011   last sleep study May 2013    Past Surgical History:  Procedure Laterality Date   BIOPSY  07/29/2022   Procedure: BIOPSY;  Surgeon: Wilhelmenia Aloha Raddle., MD;  Location: [facility];  Service: Gastroenterology;;   CESAREAN SECTION     x 2   COLONOSCOPY WITH PROPOFOL  N/A 07/29/2022   Procedure: COLONOSCOPY WITH PROPOFOL ;  Surgeon: Wilhelmenia Aloha Raddle., MD;  Location: [facility];  Service: Gastroenterology;  Laterality: N/A;   ESOPHAGOGASTRODUODENOSCOPY (EGD) WITH PROPOFOL  N/A 07/29/2022   Procedure: ESOPHAGOGASTRODUODENOSCOPY (EGD) WITH PROPOFOL ;  Surgeon: Wilhelmenia Aloha Raddle., MD;  Location: [facility];  Service: Gastroenterology;  Laterality: N/A;   ESOPHAGOGASTRODUODENOSCOPY (EGD) WITH PROPOFOL  N/A 09/05/2023   Procedure: ESOPHAGOGASTRODUODENOSCOPY (EGD) WITH PROPOFOL ;  Surgeon: Wilhelmenia Aloha Raddle., MD;  Location: [facility];  Service: Gastroenterology;  Laterality: N/A;   EUS N/A 07/29/2022   Procedure: UPPER ENDOSCOPIC ULTRASOUND (EUS) LINEAR;  Surgeon: Wilhelmenia Aloha Raddle., MD;  Location: [facility];  Service: Gastroenterology;  Laterality: N/A;   FINE NEEDLE ASPIRATION N/A 07/29/2022   Procedure: FINE NEEDLE ASPIRATION (FNA) LINEAR;  Surgeon: Wilhelmenia Aloha Raddle., MD;  Location: [facility];  Service: Gastroenterology;  Laterality: N/A;   FINE NEEDLE ASPIRATION N/A 09/05/2023   Procedure: FINE NEEDLE ASPIRATION (FNA) LINEAR;  Surgeon: Wilhelmenia Aloha Raddle., MD;  Location: [facility];  Service:  Gastroenterology;  Laterality: N/A;   LAPAROSCOPIC APPENDECTOMY  12/03/2011   Procedure: APPENDECTOMY LAPAROSCOPIC;  Surgeon: Redell Faith, DO;  Location: [facility];  Service: General;  Laterality: N/A;  drainage of intra-abdominal abscess    PARS PLANA VITRECTOMY Left 08/17/2012   Procedure: PARS PLANA VITRECTOMY WITH 25 GAUGE;  Surgeon: Norleen JONETTA Ku, MD;  [facility];  Service: Ophthalmology;  Laterality: Left;   POLYPECTOMY  07/29/2022   Procedure: POLYPECTOMY;  Surgeon: Mansouraty, Aloha Raddle., MD;  Location: [facility];  Service: Gastroenterology;;   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   Procedure: REPAIR OF COMPLEX TRACTION RETINAL DETACHMENT;  Surgeon: Norleen JONETTA Ku, MD;  Location: [facility] OR;  Service: Ophthalmology;  Laterality: Left;   TUBAL LIGATION     UPPER ESOPHAGEAL ENDOSCOPIC ULTRASOUND (EUS) N/A 09/05/2023   Procedure: UPPER ESOPHAGEAL ENDOSCOPIC ULTRASOUND (EUS);  Surgeon: Wilhelmenia Aloha Raddle., MD;  Location: [facility];  Service: Gastroenterology;  Laterality: N/A;    Family History  Problem Relation Age of Onset   Breast cancer Mother 19   Diabetes Mother    Hypertension Mother    Kidney failure Father    Heart disease Father        No details   Diabetes Sister    Hypertension Sister    Hyperlipidemia Sister    Diabetes Maternal Uncle    Cancer Maternal Uncle        type unknown   Diabetes Paternal Aunt  Hypertension Paternal Aunt    Breast cancer Paternal Aunt    Fibromyalgia Paternal Aunt    Breast cancer Maternal Grandmother    Hypertension Maternal Grandmother    Kidney disease Maternal Grandfather    Cancer Maternal Grandfather        type unknown   Diabetes Paternal Grandmother    Diabetes Paternal Grandfather    Diabetes Daughter    Colon cancer Neg Hx    Esophageal cancer Neg Hx    Inflammatory bowel disease Neg Hx    Liver disease Neg Hx    Pancreatic cancer Neg Hx    Rectal cancer Neg Hx    Stomach cancer  Neg Hx     Social History Reviewed with no changes to be made today.   Outpatient Medications Prior to Visit  Medication Sig Dispense Refill   albuterol  (VENTOLIN  HFA) 108 (90 Base) MCG/ACT inhaler Inhale 2 puffs into the lungs every 6 (six) hours as needed for wheezing or shortness of breath. (Patient taking differently: Inhale 2 puffs into the lungs every 6 (six) hours as needed for wheezing or shortness of breath. During allergy season) 18 g 0   blood glucose meter kit and supplies KIT Use up to 2 times daily as directed. 1 each 0   Blood Pressure Monitor DEVI Please provide patient with insurance approved blood pressure monitor 1 each 0   Cholecalciferol  (VITAMIN D -3) 125 MCG (5000 UT) TABS Take 1 tablet by mouth daily. 90 tablet 3   Continuous Glucose Sensor (DEXCOM G7 SENSOR) MISC Use to check blood sugar continuously throughout the day. E11.9 9 each 2   cyclobenzaprine  (FLEXERIL ) 10 MG tablet TAKE 1 TABLET (10 MG TOTAL) BY MOUTH 3 (THREE) TIMES DAILY AS NEEDED FOR MUSCLE SPASMS. 90 tablet 2   Dulaglutide  (TRULICITY ) 1.5 MG/0.5ML SOAJ Inject 1.5 mg into the skin once a week. 6 mL 3   fenofibrate  (TRICOR ) 145 MG tablet Take 1 tablet (145 mg total) by mouth daily. 90 tablet 1   gabapentin  (NEURONTIN ) 300 MG capsule Take 1 capsule (300 mg total) by mouth at bedtime. 270 capsule 2   glipiZIDE  (GLUCOTROL ) 10 MG tablet Take 2 tablets (20 mg total) by mouth 2 (two) times daily before a meal. 360 tablet 2   glucose blood (ONETOUCH VERIO) test strip Testing twice daily. 200 each 0   hydrochlorothiazide  (HYDRODIURIL ) 25 MG tablet Take 1 tablet (25 mg total) by mouth daily. 90 tablet 0   hyoscyamine  (LEVSIN ) 0.125 MG tablet Take 1 tablet (0.125 mg total) by mouth every 6 (six) hours as needed for cramping (diarrhea,nausea). 60 tablet 1   insulin  glargine (LANTUS  SOLOSTAR) 100 UNIT/ML Solostar Pen Inject 36 Units into the skin 2 (two) times daily. 75 mL 4   Insulin  Pen Needle (B-D UF III MINI PEN  NEEDLES) 31G X 5 MM MISC Inject as directed in the morning and at bedtime. 200 each 3   Lancets (ONETOUCH DELICA PLUS LANCET33G) MISC Testing twice daily 200 each 1   lisinopril  (ZESTRIL ) 40 MG tablet Take 1 tablet (40 mg total) by mouth daily. 90 tablet 0   lubiprostone  (AMITIZA ) 24 MCG capsule Take 1 capsule (24 mcg total) by mouth 2 (two) times daily with a meal. Start with 1 daily for 2 days then increase to 2 daily 28 capsule 0   methimazole  (TAPAZOLE ) 5 MG tablet Take 1 tablet (5 mg total) by mouth 2 (two) times daily. 180 tablet 3   Multiple Vitamin (MULTI-VITAMIN) tablet Take 1  tablet by mouth daily.     pantoprazole  (PROTONIX ) 40 MG tablet Take 1 tablet (40 mg total) by mouth 2 (two) times daily. 180 tablet 0   rosuvastatin  (CRESTOR ) 5 MG tablet Take 1 tablet (5 mg total) by mouth daily. 90 tablet 0   simethicone  (MYLICON) 80 MG chewable tablet Chew 1 tablet (80 mg total) by mouth every 6 (six) hours as needed for flatulence. 30 tablet 1   vitamin C  (ASCORBIC ACID ) 500 MG tablet Take 1,000 mg by mouth daily.     diclofenac  Sodium (VOLTAREN ) 1 % GEL APPLY 4 Grams TOPICALLY TO AFFECTED AREA 4 (FOUR) TIMES DAILY. (Patient not taking: Reported on 02/17/2024) 300 g 2   linaclotide  (LINZESS ) 290 MCG CAPS capsule Take 1 capsule (290 mcg total) by mouth daily before breakfast. (Patient not taking: Reported on 02/17/2024) 90 capsule 2   No facility-administered medications prior to visit.    Allergies  Allergen Reactions   Latex Other (See Comments)    Infection in left middle finger from latex gloves due to moisture.   Augmentin  [Amoxicillin -Pot Clavulanate] Itching    Burning and itching, back pains, elevated sugar levels   Iohexol       Code: RASH, Desc: PATIENT CALLED 06/17/08-STATED AFTER 6 HOURS POST IV CONTRAST, HANDS,SCALP AND GROIN AREA DEVELOPED ITCHING,BURNING SENSATION, Onset Date: [id number]        Objective:    BP 126/69 (BP Location: Right Arm, Patient Position: Sitting, Cuff  Size: Large)   Pulse 76   Resp 19   Ht 5' 8 (1.727 m)   Wt (!) 305 lb 9.6 oz (138.6 kg)   LMP 08/09/2012   SpO2 97%   BMI 46.47 kg/m  Wt Readings from Last 3 Encounters:  02/17/24 (!) 305 lb 9.6 oz (138.6 kg)  01/27/24 (!) 301 lb 4 oz (136.6 kg)  12/14/23 (!) 307 lb (139.3 kg)    Physical Exam Vitals and nursing note reviewed.  Constitutional:      Appearance: She is well-developed.  HENT:     Head: Normocephalic and atraumatic.  Cardiovascular:     Rate and Rhythm: Normal rate and regular rhythm.     Heart sounds: Normal heart sounds. No murmur heard.    No friction rub. No gallop.  Pulmonary:     Effort: Pulmonary effort is normal. No tachypnea or respiratory distress.     Breath sounds: Normal breath sounds. No decreased breath sounds, wheezing, rhonchi or rales.  Chest:     Chest wall: No tenderness.  Abdominal:     General: Bowel sounds are normal.     Palpations: Abdomen is soft.  Musculoskeletal:        General: Normal range of motion.     Cervical back: Normal range of motion.     Left knee: Tenderness present over the medial joint line.  Skin:    General: Skin is warm and dry.  Neurological:     Mental Status: She is alert and oriented to person, place, and time.     Coordination: Coordination normal.  Psychiatric:        Behavior: Behavior normal. Behavior is cooperative.        Thought Content: Thought content normal.        Judgment: Judgment normal.          Patient has been counseled extensively about nutrition and exercise as well as the importance of adherence with medications and regular follow-up. The patient was given clear instructions to go to ER  or return to medical center if symptoms don't improve, worsen or new problems develop. The patient verbalized understanding.   Follow-up: Return in about 3 months (around 05/19/2024).   Haze LELON Servant, FNP-BC [facility] [place], [place] [phone]    02/17/2024, 10:31 PM

## 2024-02-17 NOTE — Patient Instructions (Addendum)
 [employer] S[employer]NA TEA   [facility] Located in: [facility] Address: [address] [place], [place], [place] [address] Phone: [phone]

## 2024-02-23 ENCOUNTER — Encounter: Payer: Self-pay | Admitting: Hematology

## 2024-02-28 ENCOUNTER — Ambulatory Visit
Admission: RE | Admit: 2024-02-28 | Discharge: 2024-02-28 | Disposition: A | Discharge status: Home / Self Care | Source: Ambulatory Visit | Attending: Nurse Practitioner

## 2024-02-28 ENCOUNTER — Other Ambulatory Visit: Payer: Self-pay

## 2024-02-28 ENCOUNTER — Telehealth: Payer: Self-pay

## 2024-02-28 DIAGNOSIS — M25562 Pain in left knee: Secondary | ICD-10-CM | POA: Diagnosis not present

## 2024-02-28 DIAGNOSIS — M1712 Unilateral primary osteoarthritis, left knee: Secondary | ICD-10-CM | POA: Diagnosis not present

## 2024-02-28 NOTE — Telephone Encounter (Signed)
 Copied from CRM [id number]. Topic: Clinical - Medication Question >> Feb 28, 2024 11:40 AM Myrick T wrote: Reason for CRM: patient is requesting a pharmacy change. She need a 90day supply of:  lisinopril  (ZESTRIL ) 40 MG tablet glipiZIDE  (GLUCOTROL ) 10 MG tablet hydrochlorothiazide  (HYDRODIURIL ) 25 MG tablet methimazole  (TAPAZOLE ) 5 MG tablet rosuvastatin  (CRESTOR ) 5 MG tablet  pantoprazole  (PROTONIX ) 40 MG tablet to go to [facility]. The request can be faxed to [phone].

## 2024-03-06 ENCOUNTER — Encounter: Payer: Self-pay | Admitting: Nurse Practitioner

## 2024-03-07 ENCOUNTER — Ambulatory Visit: Payer: Self-pay | Admitting: Nurse Practitioner

## 2024-03-08 ENCOUNTER — Other Ambulatory Visit: Payer: Self-pay | Admitting: Nurse Practitioner

## 2024-03-08 ENCOUNTER — Other Ambulatory Visit: Payer: Self-pay

## 2024-03-08 DIAGNOSIS — E785 Hyperlipidemia, unspecified: Secondary | ICD-10-CM

## 2024-03-09 ENCOUNTER — Other Ambulatory Visit: Payer: Self-pay

## 2024-03-09 MED ORDER — FENOFIBRATE 145 MG PO TABS
145.0000 mg | ORAL_TABLET | Freq: Every day | ORAL | 1 refills | Status: AC
  Filled 2024-03-09: qty 90, 90d supply, fill #0
  Filled 2024-06-08 – 2024-07-02 (×2): qty 90, 90d supply, fill #1
  Filled 2024-07-18: qty 30, 30d supply, fill #1
  Filled 2024-08-15: qty 30, 30d supply, fill #2

## 2024-03-13 ENCOUNTER — Other Ambulatory Visit: Payer: Self-pay | Admitting: Nurse Practitioner

## 2024-03-13 DIAGNOSIS — M25561 Pain in right knee: Secondary | ICD-10-CM

## 2024-03-16 ENCOUNTER — Other Ambulatory Visit: Payer: Self-pay

## 2024-03-23 ENCOUNTER — Encounter: Payer: Self-pay | Admitting: Hematology

## 2024-03-23 ENCOUNTER — Ambulatory Visit
Admission: RE | Admit: 2024-03-23 | Discharge: 2024-03-23 | Disposition: A | Discharge status: Home / Self Care | Source: Ambulatory Visit | Attending: Nurse Practitioner | Admitting: Nurse Practitioner

## 2024-03-23 DIAGNOSIS — Z1231 Encounter for screening mammogram for malignant neoplasm of breast: Secondary | ICD-10-CM

## 2024-03-27 NOTE — Therapy (Signed)
 OUTPATIENT PHYSICAL THERAPY LOWER EXTREMITY EVALUATION   Patient Name: Kristina Adams MRN: [id number] DOB:[birth year], 58 y.o., female Today's Date: 03/28/2024  END OF SESSION:  PT End of Session - 03/28/24 1156     Visit Number 1    Number of Visits 17    Date for PT Re-Evaluation 05/23/24    Authorization Type [employer] MCR    PT Start Time 1017    PT Stop Time 1057    PT Time Calculation (min) 40 min    Activity Tolerance Patient tolerated treatment well    Behavior During Therapy WFL for tasks assessed/performed          Past Medical History:  Diagnosis Date   Anemia 12/09/2011   child birth   Anxiety    Appendicitis, acute 12/03/2011   Arthritis    Chronic back pain 12/03/2011   Degenerative disc disease, cervical    low back area   Diabetes mellitus type 2, insulin  dependent (HCC) 12/03/2011   GERD (gastroesophageal reflux disease)    H/O hiatal hernia    HLD (hyperlipidemia)    Hypertension 12/03/2011   Hyperthyroidism    IBS (irritable bowel syndrome)    Morbid obesity with BMI of 45.0-49.9, adult (HCC) 12/03/2011   Sleep apnea 12/03/2011   last sleep study May 2013   Past Surgical History:  Procedure Laterality Date   BIOPSY  07/29/2022   Procedure: BIOPSY;  Surgeon: Wilhelmenia Aloha Raddle., MD;  Location: [facility];  Service: Gastroenterology;;   CESAREAN SECTION     x 2   COLONOSCOPY WITH PROPOFOL  N/A 07/29/2022   Procedure: COLONOSCOPY WITH PROPOFOL ;  Surgeon: Wilhelmenia Aloha Raddle., MD;  Location: [facility];  Service: Gastroenterology;  Laterality: N/A;   ESOPHAGOGASTRODUODENOSCOPY (EGD) WITH PROPOFOL  N/A 07/29/2022   Procedure: ESOPHAGOGASTRODUODENOSCOPY (EGD) WITH PROPOFOL ;  Surgeon: Wilhelmenia Aloha Raddle., MD;  Location: [facility];  Service: Gastroenterology;  Laterality: N/A;   ESOPHAGOGASTRODUODENOSCOPY (EGD) WITH PROPOFOL  N/A 09/05/2023   Procedure: ESOPHAGOGASTRODUODENOSCOPY (EGD) WITH PROPOFOL ;  Surgeon: Wilhelmenia Aloha Raddle., MD;   Location: [facility];  Service: Gastroenterology;  Laterality: N/A;   EUS N/A 07/29/2022   Procedure: UPPER ENDOSCOPIC ULTRASOUND (EUS) LINEAR;  Surgeon: Wilhelmenia Aloha Raddle., MD;  Location: [facility];  Service: Gastroenterology;  Laterality: N/A;   FINE NEEDLE ASPIRATION N/A 07/29/2022   Procedure: FINE NEEDLE ASPIRATION (FNA) LINEAR;  Surgeon: Wilhelmenia Aloha Raddle., MD;  Location: [facility];  Service: Gastroenterology;  Laterality: N/A;   FINE NEEDLE ASPIRATION N/A 09/05/2023   Procedure: FINE NEEDLE ASPIRATION (FNA) LINEAR;  Surgeon: Wilhelmenia Aloha Raddle., MD;  Location: [facility];  Service: Gastroenterology;  Laterality: N/A;   LAPAROSCOPIC APPENDECTOMY  12/03/2011   Procedure: APPENDECTOMY LAPAROSCOPIC;  Surgeon: Redell Faith, DO;  Location: [facility];  Service: General;  Laterality: N/A;  drainage of intra-abdominal abscess    PARS PLANA VITRECTOMY Left 08/17/2012   Procedure: PARS PLANA VITRECTOMY WITH 25 GAUGE;  Surgeon: Norleen JONETTA Ku, MD;  Location: [facility];  Service: Ophthalmology;  Laterality: Left;   POLYPECTOMY  07/29/2022   Procedure: POLYPECTOMY;  Surgeon: Mansouraty, Aloha Raddle., MD;  Location: [facility];  Service: Gastroenterology;;   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   Procedure: REPAIR OF COMPLEX TRACTION RETINAL DETACHMENT;  Surgeon: Norleen JONETTA Ku, MD;  Location: [facility];  Service: Ophthalmology;  Laterality: Left;   TUBAL LIGATION     UPPER ESOPHAGEAL ENDOSCOPIC ULTRASOUND (EUS) N/A 09/05/2023   Procedure: UPPER ESOPHAGEAL ENDOSCOPIC ULTRASOUND (EUS);  Surgeon: Wilhelmenia Aloha Raddle., MD;  Location: [facility];  Service: Gastroenterology;  Laterality: N/A;   Patient Active Problem List   Diagnosis Date Noted   Abdominal cramping 01/29/2024   Irritable bowel syndrome with constipation 01/29/2024   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   Abdominal bloating 01/29/2023    LUQ pain 01/29/2023   Chronic constipation 01/29/2023   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 12/23/2022   Nodule of esophagus 10/14/2022   Dysuria 10/14/2022   History of Helicobacter infection 10/14/2022   Constipation 10/14/2022   Abdominal pain 10/01/2019   Anemia 10/01/2019   Uterine leiomyoma 10/01/2019   Seasonal allergies 09/24/2019   Irritable bowel syndrome with both constipation and diarrhea 09/24/2019   Vitamin D  deficiency 09/24/2019   Benign essential hypertension 11/05/2015   Gastroesophageal reflux disease 11/05/2015   Type 2 diabetes mellitus with polyneuropathy (HCC) 11/05/2015   Hyperthyroidism 11/05/2015   Multinodular goiter 11/06/2013   Obesity, Class III, BMI 40-49.9 (morbid obesity) 05/23/2013   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   Diabetic neuropathy (HCC) 12/13/2011   Iron  deficiency anemia 12/09/2011   Sleep apnea 12/03/2011   Morbid obesity with BMI of 45.0-49.9, adult (HCC) 12/03/2011   Chronic back pain 12/03/2011   Hypertension 12/03/2011   Depression 12/03/2011   Appendicitis, acute 12/03/2011   Diabetes mellitus type 2, insulin  dependent (HCC) 12/03/2011   DM (diabetes mellitus) (HCC) 10/06/2011   HYPOGLYCEMIA 08/22/2010   Allergic rhinitis 08/22/2010   Thyrotoxicosis 06/27/2010   DIABETES MELLITUS, TYPE II, UNCONTROLLED 06/27/2010   Hyperlipidemia 06/27/2010   MORBID OBESITY 06/27/2010   Essential hypertension 06/27/2010   PERS HX NONCOMPLIANCE W/MED TX PRS HAZARDS HLTH 06/27/2010   NONSPECIFIC ABN FINDING RAD & OTH EXAM GI TRACT 02/26/2009   NEOPLASM UNCERTAIN BHV OTH&UNSPEC DIGESTIVE ORGN 02/26/2008    PCP: Theotis Haze ORN, NP  REFERRING PROVIDER: Theotis Haze ORN, NP  REFERRING DIAG: [phone] (ICD-10-CM) - Acute pain of right knee   THERAPY DIAG:  Acute pain of left knee  Muscle weakness (generalized)  Other abnormalities of gait and mobility  Rationale for Evaluation and Treatment: Rehabilitation  ONSET DATE:  July 2025  SUBJECTIVE:   SUBJECTIVE STATEMENT: Pt presents to PT with reports of 2 month hx of L knee pain after fall while stepping off of curb and hitting L knee on ground. Notes pain is most significant with transfers and stairs. Has trouble getting comfortable with sleep. Wants to get back to walking recreationally, feels very limited. Has to off weight L LE in order to move sit>stand.   PERTINENT HISTORY: DM II, HTN  PAIN:  Are you having pain?  Yes: NPRS scale: 5/10 Worst: 7/10 Pain location: anterior knee, L quad Pain description: sharp, sore, tight Aggravating factors: transfers, stairs Relieving factors: Voltaren    PRECAUTIONS: None  RED FLAGS: None   WEIGHT BEARING RESTRICTIONS: No  FALLS:  Has patient fallen in last 6 months? Yes. Number of falls one fall stepping of a curb landing on L knee  LIVING ENVIRONMENT: Lives with: lives with their family Lives in: House/apartment Stairs: No  OCCUPATION: Not currently working  PLOF: Independent  PATIENT GOALS: pt wants to decrease knee pain in order to get back to walking recreationally   NEXT MD VISIT: 05/21/2024  OBJECTIVE:  Note: Objective measures were completed at Evaluation unless otherwise noted.  DIAGNOSTIC FINDINGS:   See imaging   PATIENT SURVEYS:  LEFS  Extreme difficulty/unable (0), Quite a bit of difficulty (1), Moderate difficulty (2),  Little difficulty (3), No difficulty (4) Survey date:  03/28/2024  Any of your usual work, housework or school activities 2  2. Usual hobbies, recreational or sporting activities 1  3. Getting into/out of the bath 2  4. Walking between rooms 2  5. Putting on socks/shoes 2  6. Squatting  1  7. Lifting an object, like a bag of groceries from the floor 2  8. Performing light activities around your home 2  9. Performing heavy activities around your home 1  10. Getting into/out of a car 1  11. Walking 2 blocks 1  12. Walking 1 mile 1  13. Going up/down 10  stairs (1 flight) 1  14. Standing for 1 hour 1  15. Sitting for 1 hour 2  16. Running on even ground 0  17. Running on uneven ground 0  18. Making sharp turns while running fast 0  19. Hopping  1  20. Rolling over in bed 2  Score total:  25/80     COGNITION: Overall cognitive status: Within functional limits for tasks assessed     SENSATION: WFL  POSTURE: rounded shoulders, forward head, and large body habitus  PALPATION: TTP to L patellar tendon, L distal quad  LOWER EXTREMITY ROM:  Active ROM Right eval Left eval  Hip flexion    Hip extension    Hip abduction    Hip adduction    Hip internal rotation    Hip external rotation    Knee flexion 121 67  Knee extension 0   Ankle dorsiflexion    Ankle plantarflexion    Ankle inversion    Ankle eversion     (Blank rows = not tested)  LOWER EXTREMITY MMT:  MMT Right eval Left eval  Hip flexion    Hip extension    Hip abduction    Hip adduction    Hip internal rotation    Hip external rotation    Knee flexion    Knee extension    Ankle dorsiflexion    Ankle plantarflexion    Ankle inversion    Ankle eversion     (Blank rows = not tested)  LOWER EXTREMITY SPECIAL TESTS:  Knee special tests: Anterior drawer test: negative, Posterior drawer test: negative, and Lachman Test: negative  FUNCTIONAL TESTS:  30 Second Sit to Stand: 5 reps - decreased WB L LE  GAIT: Distance walked: 93ft Assistive device utilized: None Level of assistance: Complete Independence Comments: antalgic gait L                                                                                              TREATMENT: [facility] Adult PT Treatment:                                                DATE: 03/28/2024 Therapeutic Exercise: Supine QS x 5 - 5 hold SLR x 5 L Seated heel slide x 5 L Seated clamshell x 5 GTB  PATIENT EDUCATION:  Education details: eval findings, LEFS, HEP, POC Person educated: Patient Education method: Explanation,  Demonstration, and Handouts Education comprehension: verbalized understanding and returned demonstration  HOME EXERCISE PROGRAM: Access Code: [email or web address] URL: [email or web address] Date: 03/28/2024 Prepared by: Alm Kingdom  Exercises - Supine Quadricep Sets  - 2 x daily - 7 x weekly - 2 sets - 10 reps - 5 sec hold - Small Range Straight Leg Raise  - 2 x daily - 7 x weekly - 2 sets - 10 reps - Seated Heel Slide (Mirrored)  - 2 x daily - 7 x weekly - 2 sets - 10 reps - 5 sec hold - Seated Hip Abduction with Resistance  - 2 x daily - 7 x weekly - 3 sets - 15 reps - green band hold  ASSESSMENT:  CLINICAL IMPRESSION: Patient is a 58 y.o. F who was seen today for physical therapy evaluation and treatment for fairly acute L knee pain in setting of fall while stepping off curb. Physical findings are consistent with referring provider impression as pt demonstrates decrease in L knee ROM and L LE strength. LFES score shows she is operating well below PLOF for home ADLs and higher level activities. Pt would benefit from skilled PT services working on improving L knee ROM, strength, and functional mobility in order to decrease pain and improve function.    OBJECTIVE IMPAIRMENTS: Abnormal gait, decreased activity tolerance, decreased balance, decreased endurance, decreased mobility, difficulty walking, decreased ROM, decreased strength, and pain  ACTIVITY LIMITATIONS: carrying, lifting, standing, squatting, stairs, transfers, and locomotion level  PARTICIPATION LIMITATIONS: driving, shopping, community activity, occupation, and yard work  PERSONAL FACTORS: Time since onset of injury/illness/exacerbation and 3+ comorbidities: DM II, HTN are also affecting patient's functional outcome.   REHAB POTENTIAL: Good  CLINICAL DECISION MAKING: Stable/uncomplicated  EVALUATION COMPLEXITY: Low   GOALS: Goals reviewed with patient? No  SHORT TERM GOALS: Target date: 04/18/2024   Pt will be  compliant and knowledgeable with initial HEP for improved comfort and carryover Baseline: initial HEP given  Goal status: INITIAL  2.  Pt will self report left knee pain no greater than 5/10 for improved comfort and functional ability Baseline: 7/10 at worst Goal status: INITIAL   LONG TERM GOALS: Target date: 05/23/2024   Pt will improve LEFS to no less than 40/80 as proxy for functional improvement with home ADLs and higher level community activity Baseline: 25/80 Goal status: INITIAL   2.  Pt will self report left knee pain no greater than 2-3/10 for improved comfort and functional ability Baseline: 7/10 at worst Goal status: INITIAL   3.  Pt will increase 30 Second Sit to Stand rep count to no less than 8 reps for improved balance, strength, and functional mobility Baseline: 5 reps  Goal status: INITIAL   4.  Pt will be able to ambulate up/down a flight of stairs with no increase in L knee pain and reciprocal pattern for improved comfort and function Baseline: unable Goal status: INITIAL  5.  Pt will be able to walk 20-30 minutes with no increase in knee pain for return to desired recreational activity with improved tolerance Baseline:  Goal status: INITIAL   PLAN:  PT FREQUENCY: 1-2x/week  PT DURATION: 8 weeks  PLANNED INTERVENTIONS: 97164- PT Re-evaluation, 97110-Therapeutic exercises, 97530- Therapeutic activity, [id number]- Neuromuscular re-education, 97535- Self Care, 02859- Manual therapy, [id number]- Gait training, [phone]- Electrical stimulation (unattended), [id number]- Electrical stimulation (manual), [id number]- Vasopneumatic device, 20560 (1-2 muscles), 20561 (3+ muscles)- Dry Needling, Patient/Family education, Cryotherapy,  and Moist heat  PLAN FOR NEXT SESSION: assess HEP response, quad and hip strengthening, L knee strength   Alm JAYSON Kingdom, PT 03/28/2024, 1:13 PM

## 2024-03-28 ENCOUNTER — Ambulatory Visit: Attending: Nurse Practitioner

## 2024-03-28 ENCOUNTER — Other Ambulatory Visit: Payer: Self-pay

## 2024-03-28 DIAGNOSIS — M25562 Pain in left knee: Secondary | ICD-10-CM | POA: Diagnosis not present

## 2024-03-28 DIAGNOSIS — M6281 Muscle weakness (generalized): Secondary | ICD-10-CM | POA: Insufficient documentation

## 2024-03-28 DIAGNOSIS — R2689 Other abnormalities of gait and mobility: Secondary | ICD-10-CM | POA: Diagnosis not present

## 2024-03-28 DIAGNOSIS — M25561 Pain in right knee: Secondary | ICD-10-CM | POA: Diagnosis not present

## 2024-03-29 ENCOUNTER — Ambulatory Visit: Admitting: Gastroenterology

## 2024-04-02 ENCOUNTER — Ambulatory Visit: Payer: Self-pay

## 2024-04-02 NOTE — Telephone Encounter (Signed)
 Going to [facility] d/t no in office availability. FYI Only or Action Required?: FYI only for provider.  Patient was last seen in primary care on 02/17/2024 by Theotis Haze ORN, NP.  Called Nurse Triage reporting Back Pain.  Symptoms began a week ago.  Interventions attempted: Rest, hydration, or home remedies.  Symptoms are: gradually worsening.  Triage Disposition: See HCP Within 4 Hours (Or PCP Triage)  Patient/caregiver understands and will follow disposition?: Yes Reason for Disposition  [1] SEVERE back pain (e.g., excruciating, unable to do any normal activities) AND [2] not improved 2 hours after pain medicine  Answer Assessment - Initial Assessment Questions No improvement with muscle relaxer or ibuprofen. States it is a sharp pain. Also having calf cramping overnight.   1. ONSET: When did the pain begin? (e.g., minutes, hours, days)     One week  2. LOCATION: Where does it hurt? (upper, mid or lower back)     Lower mid-back, radiates to bilateral buttocks and left leg  3. SEVERITY: How bad is the pain?  (e.g., Scale 1-10; mild, moderate, or severe)     At worst when she first wakes up - 15/10  4. PATTERN: Is the pain constant? (e.g., yes, no; constant, intermittent)      Constant  5. RADIATION: Does the pain shoot into your legs or somewhere else?     Buttocks and leg  6. CAUSE:  What do you think is causing the back pain?      Suspects sciatica  7. BACK OVERUSE:  Any recent lifting of heavy objects, strenuous work or exercise?     Denies  8. MEDICINES: What have you taken so far for the pain? (e.g., nothing, acetaminophen , NSAIDS)     Muscle relaxer and ibuprofen  9. OTHER SYMPTOMS: Do you have any other symptoms? (e.g., fever, abdomen pain, burning with urination, blood in urine)       States possible increase in frequency at night. Denies burning/pain.  Protocols used: Back Pain-A-AH Copied from CRM [id number]. Topic: Clinical - Red Word Triage >>  Apr 02, 2024 11:35 AM Charlet HERO wrote: Red Word that prompted transfer to Nurse Triage: Patient is calling about back pain that is running down the back of her left leg, has degenrative disk and feels like its squeezing her buttocks and then goes down her leg, has been for a week [employer]

## 2024-04-02 NOTE — Telephone Encounter (Signed)
 Noted

## 2024-04-04 ENCOUNTER — Other Ambulatory Visit: Payer: Self-pay

## 2024-04-04 ENCOUNTER — Ambulatory Visit
Admission: RE | Admit: 2024-04-04 | Discharge: 2024-04-04 | Disposition: A | Discharge status: Home / Self Care | Source: Ambulatory Visit | Attending: Nurse Practitioner | Admitting: Nurse Practitioner

## 2024-04-04 VITALS — BP 138/76 | HR 72 | Temp 98.0°F | Resp 14

## 2024-04-04 DIAGNOSIS — M47816 Spondylosis without myelopathy or radiculopathy, lumbar region: Secondary | ICD-10-CM | POA: Diagnosis not present

## 2024-04-04 DIAGNOSIS — Z8739 Personal history of other diseases of the musculoskeletal system and connective tissue: Secondary | ICD-10-CM | POA: Diagnosis not present

## 2024-04-04 DIAGNOSIS — M5441 Lumbago with sciatica, right side: Secondary | ICD-10-CM | POA: Diagnosis not present

## 2024-04-04 DIAGNOSIS — M5442 Lumbago with sciatica, left side: Secondary | ICD-10-CM

## 2024-04-04 MED ORDER — DEXAMETHASONE SODIUM PHOSPHATE 10 MG/ML IJ SOLN
10.0000 mg | Freq: Once | INTRAMUSCULAR | Status: AC
  Administered 2024-04-04: 10 mg via INTRAMUSCULAR

## 2024-04-04 MED ORDER — GABAPENTIN 100 MG PO CAPS
ORAL_CAPSULE | ORAL | 0 refills | Status: DC
  Filled 2024-04-04: qty 14, 7d supply, fill #0

## 2024-04-04 MED ORDER — KETOROLAC TROMETHAMINE 30 MG/ML IJ SOLN
60.0000 mg | Freq: Once | INTRAMUSCULAR | Status: AC
Start: 2024-04-04 — End: 2024-04-04
  Administered 2024-04-04: 60 mg via INTRAMUSCULAR

## 2024-04-04 MED ORDER — NAPROXEN 500 MG PO TABS
500.0000 mg | ORAL_TABLET | Freq: Two times a day (BID) | ORAL | 0 refills | Status: AC
  Filled 2024-04-04: qty 20, 10d supply, fill #0

## 2024-04-04 NOTE — ED Triage Notes (Signed)
 Pt reports low back pain x9 days. Pain radiates down both legs - all the way down to foot on L leg, only radiates to R thigh per pt. Notes tingling in both feet. Pt reports barely being able to walk with the pain. No injury or trauma to the area. Aggravated: any activity, laying down at night, sitting up for more than 1 hr. Relieved: sitting down with L leg propped up on chair (only relieves pain for 1hr before pain is back). Pt is taking ibuprofen and cyclobenzaprine  with no relief (no doses taken today). Pt notes she had a fall x2 months ago onto L knee and just started PT last week on L knee - she is unsure if something there is related. Describes sharp twinges of pain with movement and getting up. Constant dull, aching and throbbing pain. Denies ever having pain like this before. Hx of degenerative disc disease and T2DM.

## 2024-04-04 NOTE — Discharge Instructions (Addendum)
 You were seen today for back pain with shooting pain down both legs, which is most consistent with sciatica caused by irritation of the nerves in your lower back. This is likely related to your history of degenerative disc disease.  You received 2 injections in the clinic today to help with pain and inflammation. At home, continue taking gabapentin  300 mg at bedtime, and I've added 100 mg in the morning (8 AM) and afternoon (2 PM) for the next 7 days. Keep using cyclobenzaprine  (Flexeril ) but only take at bedtime. Start naproxen  500 mg twice a day with food, but do not take any other NSAIDs (such as ibuprofen or Aleve ) while on this.  If needed, you may take Tylenol  (acetaminophen ) 1000 mg every six hours for additional pain relief. This equals two 500 mg tablets at a time. Be careful not to take more than 4000 mg of Tylenol  in a 24-hour period. Avoid bending, stooping, lifting more than 10 pounds, or standing/walking for long periods. You can use ice or heat to the lower back, switching between the two as needed. Water aerobics is safe to continue if it feels comfortable. Follow up with orthopedics if your symptoms don't improve or return after getting better. Go to the emergency room right away if you develop new or worsening weakness, numbness, trouble controlling your bladder or bowels, or sudden severe pain.

## 2024-04-04 NOTE — ED Provider Notes (Signed)
 [facility]    CSN: [id number] Arrival date & time: 04/04/24  1157      History   Chief Complaint Chief Complaint  Patient presents with   Back Pain    HPI Kristina Adams is a 58 y.o. female.   Discussed the use of AI scribe software for clinical note transcription with the patient, who gave verbal consent to proceed.   The patient with a history of degenerative disc disease and diabetes presenting with low back pain. She reports the onset of sharp pain in her lower back and buttocks approximately 10 days ago, which began suddenly. The pain radiates bilaterally, with the left side being more severe. On the left, the pain extends down the entire leg to the calf, where she experiences cramping at night. On the right side, the pain is localized to the buttocks and thigh. The patient describes the pain as sharp and rates it as severe, significantly impacting her daily activities. She reports difficulty standing, walking, and getting in the shower. Simple tasks like cleaning her kitchen have become challenging due to the pain.  The patient notes that sitting provides some relief, but trying to stand or move around exacerbates the discomfort. She has been taking her prescribed gabapentin  300mg  at bedtime and PRN cyclobenzaprine  three times a day which has not provided any pain relief.   The patient mentions having a history of similar symptoms, with her last significant episode occurring in October 2020. At that time, she was diagnosed with right-sided low back pain and sciatica, receiving steroid injection via lumbar diagnostic facet joint nerve block. She has not had any flare-ups since then until this current episode.  Recently, the patient joined a fitness center and had her first water aerobics class yesterday, which she found helpful while in the water but experienced increased pain upon exiting the pool. She expresses a strong desire to continue with her exercise regimen, as  she is committed to improving her overall health and reducing her A1c levels.  The following sections of the patient's history were reviewed and updated as appropriate: allergies, current medications, past family history, past medical history, past social history, past surgical history, and problem list.      Past Medical History:  Diagnosis Date   Anemia 12/09/2011   child birth   Anxiety    Appendicitis, acute 12/03/2011   Arthritis    Chronic back pain 12/03/2011   Degenerative disc disease, cervical    low back area   Diabetes mellitus type 2, insulin  dependent (HCC) 12/03/2011   GERD (gastroesophageal reflux disease)    H/O hiatal hernia    HLD (hyperlipidemia)    Hypertension 12/03/2011   Hyperthyroidism    IBS (irritable bowel syndrome)    Morbid obesity with BMI of 45.0-49.9, adult (HCC) 12/03/2011   Sleep apnea 12/03/2011   last sleep study May 2013    Patient Active Problem List   Diagnosis Date Noted   Abdominal cramping 01/29/2024   Irritable bowel syndrome with constipation 01/29/2024   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   Abdominal bloating 01/29/2023   LUQ pain 01/29/2023   Chronic constipation 01/29/2023   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 12/23/2022   Nodule of esophagus 10/14/2022   Dysuria 10/14/2022   History of Helicobacter infection 10/14/2022   Constipation 10/14/2022   Abdominal pain 10/01/2019   Anemia 10/01/2019   Uterine leiomyoma 10/01/2019   Seasonal allergies 09/24/2019  Irritable bowel syndrome with both constipation and diarrhea 09/24/2019   Vitamin D  deficiency 09/24/2019   Gastroesophageal reflux disease 11/05/2015   Type 2 diabetes mellitus with polyneuropathy (HCC) 11/05/2015   Hyperthyroidism 11/05/2015   Abnormal Pap smear of cervix 11/05/2015   Multinodular goiter 11/06/2013   Obesity, Class III, BMI 40-49.9 (morbid obesity) 05/23/2013   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   Diabetic neuropathy (HCC) 12/13/2011   Iron  deficiency anemia 12/09/2011   Sleep apnea 12/03/2011   Morbid obesity with BMI of 45.0-49.9, adult (HCC) 12/03/2011   Chronic back pain 12/03/2011   Hypertension 12/03/2011   Depression 12/03/2011   Appendicitis, acute 12/03/2011   Diabetes mellitus type 2, insulin  dependent (HCC) 12/03/2011   DM (diabetes mellitus) (HCC) 10/06/2011   Hypoglycemia 08/22/2010   Allergic rhinitis 08/22/2010   Thyrotoxicosis 06/27/2010   Diabetes mellitus (HCC) 06/27/2010   Hyperlipidemia 06/27/2010   MORBID OBESITY 06/27/2010   Hypertensive disorder 06/27/2010   PERS HX NONCOMPLIANCE W/MED TX PRS HAZARDS HLTH 06/27/2010   Benign essential hypertension 06/27/2010   NONSPECIFIC ABN FINDING RAD & OTH EXAM GI TRACT 02/26/2009   NEOPLASM UNCERTAIN BHV OTH&UNSPEC DIGESTIVE ORGN 02/26/2008    Past Surgical History:  Procedure Laterality Date   BIOPSY  07/29/2022   Procedure: BIOPSY;  Surgeon: Wilhelmenia Aloha Raddle., MD;  Location: [facility];  Service: Gastroenterology;;   CESAREAN SECTION     x 2   COLONOSCOPY WITH PROPOFOL  N/A 07/29/2022   Procedure: COLONOSCOPY WITH PROPOFOL ;  Surgeon: Wilhelmenia Aloha Raddle., MD;  Location: [facility];  Service: Gastroenterology;  Laterality: N/A;   ESOPHAGOGASTRODUODENOSCOPY (EGD) WITH PROPOFOL  N/A 07/29/2022   Procedure: ESOPHAGOGASTRODUODENOSCOPY (EGD) WITH PROPOFOL ;  Surgeon: Wilhelmenia Aloha Raddle., MD;  Location: [facility];  Service: Gastroenterology;  Laterality: N/A;   ESOPHAGOGASTRODUODENOSCOPY (EGD) WITH PROPOFOL  N/A 09/05/2023   Procedure: ESOPHAGOGASTRODUODENOSCOPY (EGD) WITH PROPOFOL ;  Surgeon: Wilhelmenia Aloha Raddle., MD;  Location: [facility];  Service: Gastroenterology;  Laterality: N/A;   EUS N/A 07/29/2022   Procedure: UPPER ENDOSCOPIC ULTRASOUND (EUS) LINEAR;  Surgeon: Wilhelmenia Aloha Raddle., MD;  Location: [facility];  Service: Gastroenterology;  Laterality: N/A;    FINE NEEDLE ASPIRATION N/A 07/29/2022   Procedure: FINE NEEDLE ASPIRATION (FNA) LINEAR;  Surgeon: Wilhelmenia Aloha Raddle., MD;  Location: [facility];  Service: Gastroenterology;  Laterality: N/A;   FINE NEEDLE ASPIRATION N/A 09/05/2023   Procedure: FINE NEEDLE ASPIRATION (FNA) LINEAR;  Surgeon: Wilhelmenia Aloha Raddle., MD;  Location: [facility];  Service: Gastroenterology;  Laterality: N/A;   LAPAROSCOPIC APPENDECTOMY  12/03/2011   Procedure: APPENDECTOMY LAPAROSCOPIC;  Surgeon: Redell Faith, DO;  Location: [facility];  Service: General;  Laterality: N/A;  drainage of intra-abdominal abscess    PARS PLANA VITRECTOMY Left 08/17/2012   Procedure: PARS PLANA VITRECTOMY WITH 25 GAUGE;  Surgeon: Norleen JONETTA Ku, MD;  Location: [facility];  Service: Ophthalmology;  Laterality: Left;   POLYPECTOMY  07/29/2022   Procedure: POLYPECTOMY;  Surgeon: Mansouraty, Aloha Raddle., MD;  Location: [facility];  Service: Gastroenterology;;   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  Home Medications    Prior to Admission medications   Medication Sig Start Date End Date Taking? Authorizing Provider  Cholecalciferol  (VITAMIN D -3) 125 MCG (5000 UT) TABS Take 1 tablet by mouth daily. 09/24/19  Yes Hilts, Ozell, MD  Continuous Glucose Sensor (DEXCOM G7 SENSOR) MISC Use to check blood sugar continuously throughout the day. E11.9 04/18/23  Yes Shamleffer, Ibtehal Jaralla, MD  cyclobenzaprine  (FLEXERIL ) 10 MG tablet TAKE 1 TABLET (10 MG TOTAL) BY MOUTH 3  (THREE) TIMES DAILY AS NEEDED FOR MUSCLE SPASMS. 10/20/22  Yes Theotis Haze ORN, NP  Dulaglutide  (TRULICITY ) 1.5 MG/0.5ML SOAJ Inject 1.5 mg into the skin once a week. 12/14/23  Yes Shamleffer, Ibtehal Jaralla, MD  fenofibrate  (TRICOR ) 145 MG tablet Take 1 tablet (145 mg total) by mouth daily. 03/09/24  Yes Newlin, Enobong, MD  gabapentin  (NEURONTIN ) 100 MG capsule Take 1 capsule (100 mg) by mouth 2 (two) times daily with breakfast (at 8 AM) and lunch (at 2 PM) for 7 days. 04/04/24  Yes Annali Lybrand, FNP  gabapentin  (NEURONTIN ) 300 MG capsule Take 1 capsule (300 mg total) by mouth at bedtime. 11/08/23  Yes Theotis Haze ORN, NP  glipiZIDE  (GLUCOTROL ) 10 MG tablet Take 2 tablets (20 mg total) by mouth 2 (two) times daily before a meal. 12/14/23  Yes Shamleffer, Donell Cardinal, MD  hydrochlorothiazide  (HYDRODIURIL ) 25 MG tablet Take 1 tablet (25 mg total) by mouth daily. 01/31/24  Yes Theotis Haze ORN, NP  insulin  glargine (LANTUS  SOLOSTAR) 100 UNIT/ML Solostar Pen Inject 36 Units into the skin 2 (two) times daily. 12/14/23  Yes Shamleffer, Donell Cardinal, MD  Insulin  Pen Needle (B-D UF III MINI PEN NEEDLES) 31G X 5 MM MISC Inject as directed in the morning and at bedtime. 12/14/23  Yes Shamleffer, Ibtehal Jaralla, MD  linaclotide  (LINZESS ) 290 MCG CAPS capsule Take 1 capsule (290 mcg total) by mouth daily before breakfast. 11/29/23  Yes Mansouraty, Aloha Raddle., MD  lisinopril  (ZESTRIL ) 40 MG tablet Take 1 tablet (40 mg total) by mouth daily. 01/31/24  Yes Fleming, Zelda W, NP  methimazole  (TAPAZOLE ) 5 MG tablet Take 1 tablet (5 mg total) by mouth 2 (two) times daily. 12/14/23  Yes Shamleffer, Ibtehal Jaralla, MD  naproxen  (NAPROSYN ) 500 MG tablet Take 1 tablet (500 mg total) by mouth 2 (two) times daily with a meal. Take with food to avoid stomach upset. Do not take any additional NSAIDs while on this. You may take tylenol  in addition to this if needed for extra pain relief. 04/04/24  Yes Cathie Bonnell, FNP   pantoprazole  (PROTONIX ) 40 MG tablet Take 1 tablet (40 mg total) by mouth 2 (two) times daily. 01/24/24  Yes Newlin, Enobong, MD  rosuvastatin  (CRESTOR ) 5 MG tablet Take 1 tablet (5 mg total) by mouth daily. 01/31/24  Yes Fleming, Zelda W, NP  simethicone  (MYLICON) 80 MG chewable tablet Chew 1 tablet (80 mg total) by mouth every 6 (six) hours as needed for flatulence. 12/20/23  Yes Fleming, Zelda W, NP  vitamin C  (ASCORBIC ACID ) 500 MG tablet Take 1,000 mg by mouth daily.   Yes [provider]  albuterol  (VENTOLIN  HFA) 108 (90 Base) MCG/ACT inhaler Inhale 2 puffs into the lungs every 6 (six) hours as needed for wheezing or shortness of breath. Patient taking differently: Inhale 2 puffs into the lungs every 6 (six) hours as needed for wheezing or shortness of breath. During allergy season 05/16/23   Fleming, Zelda W, NP  blood glucose meter kit and supplies KIT Use up  to 2 times daily as directed. Patient not taking: Reported on 04/04/2024 06/02/22   Theotis Haze ORN, NP  Blood Pressure Monitor DEVI Please provide patient with insurance approved blood pressure monitor 10/20/22   Theotis Haze ORN, NP  glucose blood (ONETOUCH VERIO) test strip Testing twice daily. Patient not taking: Reported on 04/04/2024 11/07/23   Newlin, Enobong, MD  hyoscyamine  (LEVSIN ) 0.125 MG tablet Take 1 tablet (0.125 mg total) by mouth every 6 (six) hours as needed for cramping (diarrhea,nausea). Patient not taking: Reported on 04/04/2024 08/01/23   Mansouraty, Aloha Raddle., MD  Lancets [facility] DELICA PLUS [place]) MISC Testing twice daily Patient not taking: Reported on 04/04/2024 06/02/22   Fleming, Zelda W, NP  lubiprostone  (AMITIZA ) 24 MCG capsule Take 1 capsule (24 mcg total) by mouth 2 (two) times daily with a meal. Start with 1 daily for 2 days then increase to 2 daily Patient not taking: Reported on 04/04/2024 02/15/24   Mansouraty, Aloha Raddle., MD  Multiple Vitamin (MULTI-VITAMIN) tablet Take 1 tablet by mouth  daily. 05/23/15   [provider]    Family History Family History  Problem Relation Age of Onset   Breast cancer Mother 8   Diabetes Mother    Hypertension Mother    Kidney failure Father    Heart disease Father        No details   Diabetes Sister    Hypertension Sister    Hyperlipidemia Sister    Diabetes Maternal Uncle    Cancer Maternal Uncle        type unknown   Diabetes Paternal Aunt    Hypertension Paternal Aunt    Breast cancer Paternal Aunt    Fibromyalgia Paternal Aunt    Breast cancer Maternal Grandmother    Hypertension Maternal Grandmother    Kidney disease Maternal Grandfather    Cancer Maternal Grandfather        type unknown   Diabetes Paternal Grandmother    Diabetes Paternal Grandfather    Diabetes Daughter    Colon cancer Neg Hx    Esophageal cancer Neg Hx    Inflammatory bowel disease Neg Hx    Liver disease Neg Hx    Pancreatic cancer Neg Hx    Rectal cancer Neg Hx    Stomach cancer Neg Hx     Social History Social History   Tobacco Use   Smoking status: Some Days    Types: Cigars    Passive exposure: Current   Smokeless tobacco: Never  Vaping Use   Vaping status: Never Used  Substance Use Topics   Alcohol use: Not Currently    Alcohol/week: 1.0 standard drink of alcohol    Types: 1 Glasses of wine per week    Comment: occasional   Drug use: No     Allergies   Latex, Augmentin  [amoxicillin -pot clavulanate], and Iohexol    Review of Systems Review of Systems  Gastrointestinal:        No bowel incontinence   Genitourinary:        No bladder incontinence   Musculoskeletal:  Positive for back pain and gait problem (painful to walk due to back pain).  Neurological:  Positive for weakness and numbness.  All other systems reviewed and are negative.    Physical Exam Triage Vital Signs ED Triage Vitals  Encounter Vitals Group     BP 04/04/24 1223 138/76     Girls Systolic BP Percentile --      Girls Diastolic BP  Percentile --  Boys Systolic BP Percentile --      Boys Diastolic BP Percentile --      Pulse Rate 04/04/24 1223 72     Resp 04/04/24 1223 14     Temp 04/04/24 1223 98 F (36.7 C)     Temp Source 04/04/24 1223 Oral     SpO2 04/04/24 1223 96 %     Weight --      Height --      Head Circumference --      Peak Flow --      Pain Score 04/04/24 1224 10     Pain Loc --      Pain Education --      Exclude from Growth Chart --    No data found.  Updated Vital Signs BP 138/76 (BP Location: Left Arm)   Pulse 72   Temp 98 F (36.7 C) (Oral)   Resp 14   LMP 08/09/2012   SpO2 96%   Visual Acuity Right Eye Distance:   Left Eye Distance:   Bilateral Distance:    Right Eye Near:   Left Eye Near:    Bilateral Near:     Physical Exam Vitals reviewed.  Constitutional:      General: She is awake. She is not in acute distress.    Appearance: Normal appearance. She is well-developed. She is obese. She is not ill-appearing, toxic-appearing or diaphoretic.  HENT:     Head: Normocephalic.     Right Ear: Hearing normal.     Left Ear: Hearing normal.     Nose: Nose normal.     Mouth/Throat:     Mouth: Mucous membranes are moist.  Eyes:     General: Vision grossly intact.     Conjunctiva/sclera: Conjunctivae normal.  Cardiovascular:     Rate and Rhythm: Normal rate and regular rhythm.     Heart sounds: Normal heart sounds.  Pulmonary:     Effort: Pulmonary effort is normal.     Breath sounds: Normal breath sounds and air entry.  Abdominal:     Palpations: Abdomen is soft.     Tenderness: There is no right CVA tenderness or left CVA tenderness.  Musculoskeletal:     Cervical back: Normal, normal range of motion and neck supple.     Thoracic back: Normal.     Lumbar back: Tenderness present. No swelling, deformity, lacerations or spasms. Normal range of motion. Positive left straight leg raise test. Negative right straight leg raise test.  Skin:    General: Skin is warm and  dry.  Neurological:     General: No focal deficit present.     Mental Status: She is alert and oriented to person, place, and time.     Cranial Nerves: Cranial nerves 2-12 are intact.     Sensory: Sensation is intact.     Motor: Motor function is intact. No weakness.     Coordination: Coordination is intact.     Gait: Gait is intact.  Psychiatric:        Mood and Affect: Mood normal.        Speech: Speech normal.        Behavior: Behavior normal. Behavior is cooperative.      UC Treatments / Results  Labs (all labs ordered are listed, but only abnormal results are displayed) Labs Reviewed - No data to display  EKG   Radiology No results found.  Procedures Procedures (including critical care time)  Medications Ordered in UC Medications  dexamethasone  (DECADRON ) injection 10 mg (10 mg Intramuscular Given 04/04/24 1315)  ketorolac  (TORADOL ) 30 MG/ML injection 60 mg (60 mg Intramuscular Given 04/04/24 1315)    Initial Impression / Assessment and Plan / UC Course  I have reviewed the triage vital signs and the nursing notes.  Pertinent labs & imaging results that were available during my care of the patient were reviewed by me and considered in my medical decision making (see chart for details).    The patient presents with an acute flare of sciatica characterized by low back pain radiating into both lower extremities, left greater than right, ongoing for approximately 10 days. She has a history of lumbar degenerative disc disease and spondylosis diagnosed in 2020, with prior treatment including a lumbar diagnostic facet joint nerve block, and has been stable until this episode. Exam findings, including a positive straight leg raise, are consistent with bilateral radiculopathy. She denies bowel or bladder incontinence. Treatment today included decadron  and toradol  in clinic, continuation of gabapentin  300 mg at bedtime with the addition of 100 mg doses in the morning and afternoon  for 7 days, continuation of cyclobenzaprine  at bedtime only, and initiation of naproxen  500 mg twice daily with food while avoiding other NSAIDs. Acetaminophen  may be used if additional pain control is needed. She was counseled on activity modifications including avoiding bending, stooping, lifting more than 10 pounds, and prolonged standing or walking, while alternating ice and heat as tolerated and continuing water aerobics if comfortable. She was advised to follow up with her primary care provider or orthopedics if symptoms persist or recur, and to seek emergency care for new or worsening neurological symptoms including weakness, numbness, or bowel or bladder dysfunction.  Today's evaluation has revealed no signs of a dangerous process. Discussed diagnosis with patient and/or guardian. Patient and/or guardian aware of their diagnosis, possible red flag symptoms to watch out for and need for close follow up. Patient and/or guardian understands verbal and written discharge instructions. Patient and/or guardian comfortable with plan and disposition.  Patient and/or guardian has a clear mental status at this time, good insight into illness (after discussion and teaching) and has clear judgment to make decisions regarding their care  Documentation was completed with the aid of voice recognition software. Transcription may contain typographical errors.   Final Clinical Impressions(s) / UC Diagnoses   Final diagnoses:  Acute bilateral low back pain with bilateral sciatica  History of spinal stenosis  Spondylosis of lumbar spine     Discharge Instructions      You were seen today for back pain with shooting pain down both legs, which is most consistent with sciatica caused by irritation of the nerves in your lower back. This is likely related to your history of degenerative disc disease.  You received 2 injections in the clinic today to help with pain and inflammation. At home, continue taking  gabapentin  300 mg at bedtime, and I've added 100 mg in the morning (8 AM) and afternoon (2 PM) for the next 7 days. Keep using cyclobenzaprine  (Flexeril ) but only take at bedtime. Start naproxen  500 mg twice a day with food, but do not take any other NSAIDs (such as ibuprofen or Aleve ) while on this.  If needed, you may take Tylenol  (acetaminophen ) 1000 mg every six hours for additional pain relief. This equals two 500 mg tablets at a time. Be careful not to take more than 4000 mg of Tylenol  in a 24-hour period. Avoid bending, stooping, lifting more than 10  pounds, or standing/walking for long periods. You can use ice or heat to the lower back, switching between the two as needed. Water aerobics is safe to continue if it feels comfortable. Follow up with orthopedics if your symptoms don't improve or return after getting better. Go to the emergency room right away if you develop new or worsening weakness, numbness, trouble controlling your bladder or bowels, or sudden severe pain.     ED Prescriptions     Medication Sig Dispense Auth. Provider   gabapentin  (NEURONTIN ) 100 MG capsule Take 1 capsule (100 mg) by mouth 2 (two) times daily with breakfast (at 8 AM) and lunch (at 2 PM) for 7 days. 14 capsule Iola Lukes, FNP   naproxen  (NAPROSYN ) 500 MG tablet Take 1 tablet (500 mg total) by mouth 2 (two) times daily with a meal. Take with food to avoid stomach upset. Do not take any additional NSAIDs while on this. You may take tylenol  in addition to this if needed for extra pain relief. 20 tablet Iola Lukes, FNP      PDMP not reviewed this encounter.   Iola Lukes, [place] 04/04/24 1321

## 2024-04-05 ENCOUNTER — Other Ambulatory Visit: Payer: Self-pay

## 2024-04-06 ENCOUNTER — Ambulatory Visit: Admitting: Physical Therapy

## 2024-04-06 ENCOUNTER — Other Ambulatory Visit: Payer: Self-pay | Admitting: Nurse Practitioner

## 2024-04-06 DIAGNOSIS — F32A Depression, unspecified: Secondary | ICD-10-CM

## 2024-04-08 ENCOUNTER — Ambulatory Visit: Admission: EM | Admit: 2024-04-08 | Discharge: 2024-04-08 | Disposition: A | Discharge status: Home / Self Care

## 2024-04-08 DIAGNOSIS — M51362 Other intervertebral disc degeneration, lumbar region with discogenic back pain and lower extremity pain: Secondary | ICD-10-CM

## 2024-04-08 MED ORDER — BACLOFEN 10 MG PO TABS: 10.0000 mg | ORAL_TABLET | Freq: Three times a day (TID) | ORAL | 0 refills | Status: DC

## 2024-04-08 MED ORDER — DICLOFENAC SODIUM 75 MG PO TBEC: 75.0000 mg | DELAYED_RELEASE_TABLET | Freq: Two times a day (BID) | ORAL | 3 refills | Status: DC

## 2024-04-08 MED ORDER — KETOROLAC TROMETHAMINE 60 MG/2ML IM SOLN
60.0000 mg | Freq: Once | INTRAMUSCULAR | Status: AC
  Administered 2024-04-08: 60 mg via INTRAMUSCULAR

## 2024-04-08 NOTE — Discharge Instructions (Addendum)
  1. Degeneration of intervertebral disc of lumbar region with discogenic back pain and lower extremity pain (Primary) - ketorolac  (TORADOL ) injection 60 mg given in UC for acute musculoskeletal pain secondary to degenerative disc disease. - diclofenac  (VOLTAREN ) 75 MG EC tablet; Take 1 tablet (75 mg total) by mouth 2 (two) times daily.  Dispense: 30 tablet; Refill: 3 - baclofen (LIORESAL) 10 MG tablet; Take 1 tablet (10 mg total) by mouth 3 (three) times daily.  Dispense: 30 each; Refill: 0 - AMB referral to [facility] for further evaluation and treatment of ongoing chronic back pain with degenerative disc disease. -Continue to monitor symptoms for any change in severity if there is any escalation of current symptoms or development of new symptoms follow-up in ER for further evaluation and management.

## 2024-04-08 NOTE — ED Triage Notes (Signed)
 Pt present low back pain, she was recently her for the following up concerning her back pain.

## 2024-04-08 NOTE — ED Provider Notes (Signed)
 [facility]  Note:  This document was prepared using Conservation officer, historic buildings and may include unintentional dictation errors.  MRN: [id number] DOB: 08-16-1965  Subjective:   Kristina Adams is a 58 y.o. female presenting for evaluation of ongoing lower back pain with radiation to bilateral lower legs, severe numbness and tingling to the left lower leg.  Patient has known history of degenerative disc disease with chronic discogenic back pain and sciatica.  Patient was here earlier in the week was given injection of dexamethasone  and Toradol  which seemed to help some but patient has been taking home medications with no relief.  Patient is requesting an x-ray of her lumbar spine despite having previous imaging showing degenerative disc disease.  Patient denies any new injury or trauma.  No current facility-administered medications for this encounter.  Current Outpatient Medications:    baclofen (LIORESAL) 10 MG tablet, Take 1 tablet (10 mg total) by mouth 3 (three) times daily., Disp: 30 each, Rfl: 0   diclofenac  (VOLTAREN ) 75 MG EC tablet, Take 1 tablet (75 mg total) by mouth 2 (two) times daily., Disp: 30 tablet, Rfl: 3   albuterol  (VENTOLIN  HFA) 108 (90 Base) MCG/ACT inhaler, Inhale 2 puffs into the lungs every 6 (six) hours as needed for wheezing or shortness of breath. (Patient taking differently: Inhale 2 puffs into the lungs every 6 (six) hours as needed for wheezing or shortness of breath. During allergy season), Disp: 18 g, Rfl: 0   blood glucose meter kit and supplies KIT, Use up to 2 times daily as directed. (Patient not taking: Reported on 04/04/2024), Disp: 1 each, Rfl: 0   Blood Pressure Monitor DEVI, Please provide patient with insurance approved blood pressure monitor, Disp: 1 each, Rfl: 0   Cholecalciferol  (VITAMIN D -3) 125 MCG (5000 UT) TABS, Take 1 tablet by mouth daily., Disp: 90 tablet, Rfl: 3   Continuous Glucose Sensor (DEXCOM G7 SENSOR) MISC, Use to check  blood sugar continuously throughout the day. E11.9, Disp: 9 each, Rfl: 2   cyclobenzaprine  (FLEXERIL ) 10 MG tablet, TAKE 1 TABLET (10 MG TOTAL) BY MOUTH 3 (THREE) TIMES DAILY AS NEEDED FOR MUSCLE SPASMS., Disp: 90 tablet, Rfl: 2   Dulaglutide  (TRULICITY ) 1.5 MG/0.5ML SOAJ, Inject 1.5 mg into the skin once a week., Disp: 6 mL, Rfl: 3   fenofibrate  (TRICOR ) 145 MG tablet, Take 1 tablet (145 mg total) by mouth daily., Disp: 90 tablet, Rfl: 1   gabapentin  (NEURONTIN ) 100 MG capsule, Take 1 capsule (100 mg) by mouth 2 (two) times daily with breakfast (at 8 AM) and lunch (at 2 PM) for 7 days., Disp: 14 capsule, Rfl: 0   gabapentin  (NEURONTIN ) 300 MG capsule, Take 1 capsule (300 mg total) by mouth at bedtime., Disp: 270 capsule, Rfl: 2   glipiZIDE  (GLUCOTROL ) 10 MG tablet, Take 2 tablets (20 mg total) by mouth 2 (two) times daily before a meal., Disp: 360 tablet, Rfl: 2   glucose blood (ONETOUCH VERIO) test strip, Testing twice daily. (Patient not taking: Reported on 04/04/2024), Disp: 200 each, Rfl: 0   hydrochlorothiazide  (HYDRODIURIL ) 25 MG tablet, Take 1 tablet (25 mg total) by mouth daily., Disp: 90 tablet, Rfl: 0   hyoscyamine  (LEVSIN ) 0.125 MG tablet, Take 1 tablet (0.125 mg total) by mouth every 6 (six) hours as needed for cramping (diarrhea,nausea). (Patient not taking: Reported on 04/04/2024), Disp: 60 tablet, Rfl: 1   insulin  glargine (LANTUS  SOLOSTAR) 100 UNIT/ML Solostar Pen, Inject 36 Units into the skin 2 (two) times daily.,  Disp: 75 mL, Rfl: 4   Insulin  Pen Needle (B-D UF III MINI PEN NEEDLES) 31G X 5 MM MISC, Inject as directed in the morning and at bedtime., Disp: 200 each, Rfl: 3   Lancets (ONETOUCH DELICA PLUS LANCET33G) MISC, Testing twice daily (Patient not taking: Reported on 04/04/2024), Disp: 200 each, Rfl: 1   linaclotide  (LINZESS ) 290 MCG CAPS capsule, Take 1 capsule (290 mcg total) by mouth daily before breakfast., Disp: 90 capsule, Rfl: 2   lisinopril  (ZESTRIL ) 40 MG tablet, Take 1  tablet (40 mg total) by mouth daily., Disp: 90 tablet, Rfl: 0   lubiprostone  (AMITIZA ) 24 MCG capsule, Take 1 capsule (24 mcg total) by mouth 2 (two) times daily with a meal. Start with 1 daily for 2 days then increase to 2 daily (Patient not taking: Reported on 04/04/2024), Disp: 28 capsule, Rfl: 0   methimazole  (TAPAZOLE ) 5 MG tablet, Take 1 tablet (5 mg total) by mouth 2 (two) times daily., Disp: 180 tablet, Rfl: 3   Multiple Vitamin (MULTI-VITAMIN) tablet, Take 1 tablet by mouth daily., Disp: , Rfl:    naproxen  (NAPROSYN ) 500 MG tablet, Take 1 tablet (500 mg total) by mouth 2 (two) times daily with a meal. Take with food to avoid stomach upset. Do not take any additional NSAIDs while on this. You may take tylenol  in addition to this if needed for extra pain relief., Disp: 20 tablet, Rfl: 0   pantoprazole  (PROTONIX ) 40 MG tablet, Take 1 tablet (40 mg total) by mouth 2 (two) times daily., Disp: 180 tablet, Rfl: 0   rosuvastatin  (CRESTOR ) 5 MG tablet, Take 1 tablet (5 mg total) by mouth daily., Disp: 90 tablet, Rfl: 0   simethicone  (MYLICON) 80 MG chewable tablet, Chew 1 tablet (80 mg total) by mouth every 6 (six) hours as needed for flatulence., Disp: 30 tablet, Rfl: 1   vitamin C  (ASCORBIC ACID ) 500 MG tablet, Take 1,000 mg by mouth daily., Disp: , Rfl:    Allergies  Allergen Reactions   Latex Other (See Comments)    Infection in left middle finger from latex gloves due to moisture.   Augmentin  [Amoxicillin -Pot Clavulanate] Itching    Burning and itching, back pains, elevated sugar levels   Iohexol       Code: RASH, Desc: PATIENT CALLED 06/17/08-STATED AFTER 6 HOURS POST IV CONTRAST, HANDS,SCALP AND GROIN AREA DEVELOPED ITCHING,BURNING SENSATION, Onset Date: [id number]     Past Medical History:  Diagnosis Date   Anemia 12/09/2011   child birth   Anxiety    Appendicitis, acute 12/03/2011   Arthritis    Chronic back pain 12/03/2011   Degenerative disc disease, cervical    low back area    Diabetes mellitus type 2, insulin  dependent (HCC) 12/03/2011   GERD (gastroesophageal reflux disease)    H/O hiatal hernia    HLD (hyperlipidemia)    Hypertension 12/03/2011   Hyperthyroidism    IBS (irritable bowel syndrome)    Morbid obesity with BMI of 45.0-49.9, adult (HCC) 12/03/2011   Sleep apnea 12/03/2011   last sleep study May 2013     Past Surgical History:  Procedure Laterality Date   BIOPSY  07/29/2022   Procedure: BIOPSY;  Surgeon: Wilhelmenia Aloha Raddle., MD;  Location: [facility];  Service: Gastroenterology;;   CESAREAN SECTION     x 2   COLONOSCOPY WITH PROPOFOL  N/A 07/29/2022   Procedure: COLONOSCOPY WITH PROPOFOL ;  Surgeon: Wilhelmenia Aloha Raddle., MD;  Location: [facility];  Service: Gastroenterology;  Laterality: N/A;  ESOPHAGOGASTRODUODENOSCOPY (EGD) WITH PROPOFOL  N/A 07/29/2022   Procedure: ESOPHAGOGASTRODUODENOSCOPY (EGD) WITH PROPOFOL ;  Surgeon: Wilhelmenia Aloha Raddle., MD;  [facility]: [facility];  Service: Gastroenterology;  Laterality: N/A;   ESOPHAGOGASTRODUODENOSCOPY (EGD) WITH PROPOFOL  N/A 09/05/2023   Procedure: ESOPHAGOGASTRODUODENOSCOPY (EGD) WITH PROPOFOL ;  Surgeon: Wilhelmenia Aloha Raddle., MD;  Location[facility];  Service: Gastroenterology;  Laterality: N/A;   EUS N/A 07/29/2022   Procedure: UPPER ENDOSCOPIC ULTRASOUND (EUS) LINEAR;  Surgeon: Wilhelmenia Aloha Raddle., MD;  Location: [facility];  Service: Gastroenterology;  Laterality: N/A;   FINE NEEDLE ASPIRATION N/A 07/29/2022   Procedure: FINE NEEDLE ASPIRATION (FNA) LINEAR;  Surgeon: Wilhelmenia Aloha Raddle., MD;  [facility];  Service: Gastroenterology;  Laterality: N/A;   FINE NEEDLE ASPIRATION N/A 09/05/2023   Procedure: FINE NEEDLE ASPIRATION (FNA) LINEAR;  Surgeon: Wilhelmenia Aloha Raddle., MD;  [facility];  Service: Gastroenterology;  Laterality: N/A;   LAPAROSCOPIC APPENDECTOMY  12/03/2011   Procedure: APPENDECTOMY LAPAROSCOPIC;  Surgeon: Redell Faith, DO;   Location: [facility];  Service: General;  Laterality: N/A;  drainage of intra-abdominal abscess    PARS PLANA VITRECTOMY Left 08/17/2012   Procedure: PARS PLANA VITRECTOMY WITH 25 GAUGE;  Surgeon: Norleen JONETTA Ku, MD;  Location: [facility];  Service: Ophthalmology;  Laterality: Left;   POLYPECTOMY  07/29/2022   Procedure: POLYPECTOMY;  Surgeon: Mansouraty, Aloha Raddle., MD;  Location: [facility];  Service: Gastroenterology;;   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   Procedure: REPAIR OF COMPLEX TRACTION RETINAL DETACHMENT;  Surgeon: Norleen JONETTA Ku, MD;  Location: [facility];  Service: Ophthalmology;  Laterality: Left;   TUBAL LIGATION     UPPER ESOPHAGEAL ENDOSCOPIC ULTRASOUND (EUS) N/A 09/05/2023   Procedure: UPPER ESOPHAGEAL ENDOSCOPIC ULTRASOUND (EUS);  Surgeon: Wilhelmenia Aloha Raddle., MD;  Location: [facility];  Service: Gastroenterology;  Laterality: N/A;    Family History  Problem Relation Age of Onset   Breast cancer Mother 74   Diabetes Mother    Hypertension Mother    Kidney failure Father    Heart disease Father        No details   Diabetes Sister    Hypertension Sister    Hyperlipidemia Sister    Diabetes Maternal Uncle    Cancer Maternal Uncle        type unknown   Diabetes Paternal Aunt    Hypertension Paternal Aunt    Breast cancer Paternal Aunt    Fibromyalgia Paternal Aunt    Breast cancer Maternal Grandmother    Hypertension Maternal Grandmother    Kidney disease Maternal Grandfather    Cancer Maternal Grandfather        type unknown   Diabetes Paternal Grandmother    Diabetes Paternal Grandfather    Diabetes Daughter    Colon cancer Neg Hx    Esophageal cancer Neg Hx    Inflammatory bowel disease Neg Hx    Liver disease Neg Hx    Pancreatic cancer Neg Hx    Rectal cancer Neg Hx    Stomach cancer Neg Hx     Social History   Tobacco Use   Smoking status: Some Days    Types: Cigars    Passive exposure: Current   Smokeless tobacco: Never   Vaping Use   Vaping status: Never Used  Substance Use Topics   Alcohol use: Not Currently    Alcohol/week: 1.0 standard drink of alcohol    Types: 1 Glasses of wine per week    Comment: occasional   Drug use: No  ROS Refer to HPI for ROS details.  Objective:   Vitals: BP (!) 168/75 (BP Location: Left Arm)   Pulse 70   Temp 98.1 F (36.7 C) (Oral)   Resp 18   LMP 08/09/2012   SpO2 96%   Physical Exam Vitals and nursing note reviewed.  Constitutional:      General: She is not in acute distress.    Appearance: Normal appearance. She is well-developed. She is not ill-appearing or toxic-appearing.  HENT:     Head: Normocephalic and atraumatic.  Cardiovascular:     Rate and Rhythm: Normal rate.  Pulmonary:     Effort: Pulmonary effort is normal. No respiratory distress.  Musculoskeletal:     Lumbar back: Spasms, tenderness and bony tenderness present. No swelling, edema, deformity or signs of trauma. Decreased range of motion. Positive right straight leg raise test and positive left straight leg raise test.  Skin:    General: Skin is warm and dry.  Neurological:     General: No focal deficit present.     Mental Status: She is alert and oriented to person, place, and time.  Psychiatric:        Mood and Affect: Mood normal.        Behavior: Behavior normal.     Procedures  No results found for this or any previous visit (from the past 24 hours).  No results found.   Assessment and Plan :     Discharge Instructions       1. Degeneration of intervertebral disc of lumbar region with discogenic back pain and lower extremity pain (Primary) - ketorolac  (TORADOL ) injection 60 mg given in UC for acute musculoskeletal pain secondary to degenerative disc disease. - diclofenac  (VOLTAREN ) 75 MG EC tablet; Take 1 tablet (75 mg total) by mouth 2 (two) times daily.  Dispense: 30 tablet; Refill: 3 - baclofen (LIORESAL) 10 MG tablet; Take 1 tablet (10 mg total) by mouth 3  (three) times daily.  Dispense: 30 each; Refill: 0 - AMB referral to [facility] for further evaluation and treatment of ongoing chronic back pain with degenerative disc disease. -Continue to monitor symptoms for any change in severity if there is any escalation of current symptoms or development of new symptoms follow-up in ER for further evaluation and management.       Dorcus Riga B Shunta Mclaurin   Seyon Strader, [place], [place] 04/08/24 [phone]

## 2024-04-10 ENCOUNTER — Other Ambulatory Visit: Payer: Self-pay

## 2024-04-10 ENCOUNTER — Ambulatory Visit

## 2024-04-11 ENCOUNTER — Ambulatory Visit: Payer: Self-pay

## 2024-04-11 NOTE — Telephone Encounter (Signed)
 Patient calling with requests of wanting an MRI to check her back out. Patient was seen at Urgent Care twice in the last six days for sciatica pain. Patient states she was given three shots which didn't help her pain. Patient reports she was given pain medication and muscle relaxers which make her sleep but not help her pain. Patient was given information about [facility] but can't be seen until 10/8. Patient states she wanting a referral for an MRI and wanting a shot to give her relief. Discussed with patient the use of the ED vs Urgent Care. Educated patient on what can be done in the ED in regards to sciatica pain. Patient states she would rather talk to her provider than go to the ED. Patient is asking for a follow up call from provider.   FYI Only or Action Required?: Action required by provider: clinical question for provider.  Patient was last seen in primary care on 02/17/2024 by Theotis Haze ORN, NP.  Called Nurse Triage reporting Back Pain.  Symptoms began several weeks ago.  Interventions attempted: Prescription medications: Baclofen, Flexeril , diclofenac  and Rest, hydration, or home remedies.  Symptoms are: unchanged.  Triage Disposition: See HCP Within 4 Hours (Or PCP Triage)  Patient/caregiver understands and will follow disposition?:   Patient states she is suffering from Sciatica and has gone to the UC twice in 6 days and gotten 3 shots but they are not working. She states [facility] has nothing until October 8th and she would like to speak to a nurse or PCP to see if she can get an MRI or some possible steps to help her get some relief as she can barely get out of bed    Reason for Disposition  [1] SEVERE back pain (e.g., excruciating, unable to do any normal activities) AND [2] not improved 2 hours after pain medicine  Answer Assessment - Initial Assessment Questions 1. ONSET: When did the pain begin? (e.g., minutes, hours, days)     Severe pain started about 2 weeks  ago 2. LOCATION: Where does it hurt? (upper, mid or lower back)     Lower pain back and goes into both hips and legs. Patient states she is having more pain to the entirety of left leg 3. SEVERITY: How bad is the pain?  (e.g., Scale 1-10; mild, moderate, or severe)     10 4. PATTERN: Is the pain constant? (e.g., yes, no; constant, intermittent)      constant 5. RADIATION: Does the pain shoot into your legs or somewhere else?     yes 6. CAUSE:  What do you think is causing the back pain?      Patient was diagnosed with sciatica and having extreme pain 7. BACK OVERUSE:  Any recent lifting of heavy objects, strenuous work or exercise?     no 8. MEDICINES: What have you taken so far for the pain? (e.g., nothing, acetaminophen , NSAIDS)     Baclofen, Naproxen  9. NEUROLOGIC SYMPTOMS: Do you have any weakness, numbness, or problems with bowel/bladder control?     Patient reports weakness and numbness to her legs and feet.  10. OTHER SYMPTOMS: Do you have any other symptoms? (e.g., fever, abdomen pain, burning with urination, blood in urine)       no  Protocols used: Back Pain-A-AH

## 2024-04-12 ENCOUNTER — Ambulatory Visit: Admitting: Physical Therapy

## 2024-04-13 DIAGNOSIS — M5416 Radiculopathy, lumbar region: Secondary | ICD-10-CM | POA: Diagnosis not present

## 2024-04-16 ENCOUNTER — Ambulatory Visit: Admitting: Internal Medicine

## 2024-04-17 ENCOUNTER — Ambulatory Visit: Admitting: Physical Therapy

## 2024-04-17 ENCOUNTER — Ambulatory Visit: Admitting: Physician Assistant

## 2024-04-20 ENCOUNTER — Other Ambulatory Visit: Payer: Self-pay

## 2024-04-20 ENCOUNTER — Telehealth: Payer: Self-pay | Admitting: Nurse Practitioner

## 2024-04-20 ENCOUNTER — Encounter: Admitting: Physical Therapy

## 2024-04-20 NOTE — Telephone Encounter (Signed)
 Called pt to reschedule appt. Pt did not answer but if pt call back p,lease reschedule to a day where provider will be in office.

## 2024-04-23 DIAGNOSIS — M545 Low back pain, unspecified: Secondary | ICD-10-CM | POA: Diagnosis not present

## 2024-04-24 ENCOUNTER — Ambulatory Visit: Admitting: Physical Therapy

## 2024-04-24 ENCOUNTER — Other Ambulatory Visit: Payer: Self-pay

## 2024-04-26 ENCOUNTER — Ambulatory Visit: Admitting: Internal Medicine

## 2024-04-27 ENCOUNTER — Encounter: Admitting: Physical Therapy

## 2024-04-27 DIAGNOSIS — M5416 Radiculopathy, lumbar region: Secondary | ICD-10-CM | POA: Diagnosis not present

## 2024-05-02 ENCOUNTER — Telehealth: Payer: Self-pay | Admitting: Nurse Practitioner

## 2024-05-02 NOTE — Telephone Encounter (Signed)
 Contacted pt left vm and sent out text message regarding her appt changing due to pcp not in the office ( cancel appt) if she call back resch her appt please thank you

## 2024-05-03 ENCOUNTER — Other Ambulatory Visit: Payer: Self-pay | Admitting: Nurse Practitioner

## 2024-05-03 DIAGNOSIS — E785 Hyperlipidemia, unspecified: Secondary | ICD-10-CM

## 2024-05-03 DIAGNOSIS — I10 Essential (primary) hypertension: Secondary | ICD-10-CM

## 2024-05-04 ENCOUNTER — Other Ambulatory Visit: Payer: Self-pay

## 2024-05-04 ENCOUNTER — Encounter: Payer: Self-pay | Admitting: Internal Medicine

## 2024-05-05 ENCOUNTER — Other Ambulatory Visit: Payer: Self-pay

## 2024-05-05 MED ORDER — HYDROCHLOROTHIAZIDE 25 MG PO TABS
25.0000 mg | ORAL_TABLET | Freq: Every day | ORAL | 0 refills | Status: DC
  Filled 2024-05-05: qty 90, 90d supply, fill #0

## 2024-05-05 MED ORDER — ROSUVASTATIN CALCIUM 5 MG PO TABS
5.0000 mg | ORAL_TABLET | Freq: Every day | ORAL | 0 refills | Status: DC
  Filled 2024-05-05: qty 90, 90d supply, fill #0

## 2024-05-05 MED ORDER — LISINOPRIL 40 MG PO TABS
40.0000 mg | ORAL_TABLET | Freq: Every day | ORAL | 0 refills | Status: DC
  Filled 2024-05-05: qty 90, 90d supply, fill #0

## 2024-05-05 NOTE — Telephone Encounter (Signed)
 Requested Prescriptions  Pending Prescriptions Disp Refills   rosuvastatin  (CRESTOR ) 5 MG tablet 90 tablet 0    Sig: Take 1 tablet (5 mg total) by mouth daily.     Cardiovascular:  Antilipid - Statins 2 Failed - 05/05/2024  8:33 AM      Failed - Lipid Panel in normal range within the last 12 months    Cholesterol, Total  Date Value Ref Range Status  04/14/2023 149 100 - 199 mg/dL Final   LDL Cholesterol (Calc)  Date Value Ref Range Status  09/24/2019 110 (H) mg/dL (calc) Final    Comment:    Reference range: <100 . Desirable range <100 mg/dL for primary prevention;   <70 mg/dL for patients with CHD or diabetic patients  with > or = 2 CHD risk factors. SABRA LDL-C is now calculated using the Martin-Hopkins  calculation, which is a validated novel method providing  better accuracy than the Friedewald equation in the  estimation of LDL-C.  Gladis APPLETHWAITE et al. SANDREA. 7986;689(80): 2061-2068  (http://education.[employer].com/faq/FAQ164)    LDL Chol Calc (NIH)  Date Value Ref Range Status  04/14/2023 75 0 - 99 mg/dL Final   HDL  Date Value Ref Range Status  04/14/2023 51 >39 mg/dL Final   Triglycerides  Date Value Ref Range Status  04/14/2023 129 0 - 149 mg/dL Final         Passed - Cr in normal range and within 360 days    Creatinine  Date Value Ref Range Status  07/08/2014 0.8 0.6 - 1.1 mg/dL Final   Creat  Date Value Ref Range Status  09/24/2019 0.81 0.50 - 1.05 mg/dL Final    Comment:    For patients >84 years of age, the reference limit for Creatinine is approximately 13% higher for people identified as African-American. .    Creatinine, Ser  Date Value Ref Range Status  11/18/2023 0.94 0.57 - 1.00 mg/dL Final   Creatinine, Urine  Date Value Ref Range Status  12/14/2023 80 20 - 275 mg/dL Final         Passed - Patient is not pregnant      Passed - Valid encounter within last 12 months    Recent Outpatient Visits           2 months ago Primary  hypertension   [facility] [place]. [facility] Theotis Haze ORN, NP   5 months ago Essential hypertension   [facility] [place]. [facility] Theotis Haze ORN, NP   9 months ago Chronic left-sided thoracic back pain   [facility] [place]. [facility] Theotis Haze ORN, NP   11 months ago Bronchitis   [facility] [place]. [facility] Theotis Haze ORN, NP   1 year ago Encounter for annual physical exam    [facility] [facility] [place]. [facility] [place], [place] W, NP               hydrochlorothiazide  (HYDRODIURIL ) 25 MG tablet 90 tablet 0    Sig: Take 1 tablet (25 mg total) by mouth daily.     Cardiovascular: Diuretics - Thiazide Failed - 05/05/2024  8:33 AM      Failed - Last BP in normal range    BP Readings from Last 1  Encounters:  04/08/24 (!) 168/75         Passed - Cr in normal range and within 180 days    Creatinine  Date Value Ref Range Status  07/08/2014 0.8 0.6 - 1.1 mg/dL Final   Creat  Date Value Ref Range Status  09/24/2019 0.81 0.50 - 1.05 mg/dL Final    Comment:    For patients >53 years of age, the reference limit for Creatinine is approximately 13% higher for people identified as African-American. .    Creatinine, Ser  Date Value Ref Range Status  11/18/2023 0.94 0.57 - 1.00 mg/dL Final   Creatinine, Urine  Date Value Ref Range Status  12/14/2023 80 20 - 275 mg/dL Final         Passed - K in normal range and within 180 days    Potassium  Date Value Ref Range Status  11/18/2023 4.6 3.5 - 5.2 mmol/L Final  07/08/2014 4.6 3.5 - 5.1 mEq/L Final         Passed - Na in normal range and within 180 days    Sodium  Date Value Ref Range Status  11/18/2023 139 134 - 144 mmol/L Final  07/08/2014 138 136 - 145 mEq/L Final         Passed - Valid  encounter within last 6 months    Recent Outpatient Visits           2 months ago Primary hypertension   [facility] [place]. [facility] Theotis Haze ORN, NP   5 months ago Essential hypertension   [facility] [place]. [facility] Theotis Haze ORN, NP   9 months ago Chronic left-sided thoracic back pain   [facility] [facility] [place]. [facility] Theotis Haze ORN, NP   11 months ago Bronchitis   [place] [place]. [facility] Theotis Haze ORN, NP   1 year ago Encounter for annual physical exam   [facility] [facility] [place]. [facility] [place], [place] W, NP               lisinopril  (ZESTRIL ) 40 MG tablet 90 tablet 0    Sig: Take 1 tablet (40 mg total) by mouth daily.     Cardiovascular:  ACE Inhibitors Failed - 05/05/2024  8:33 AM      Failed - Last BP in normal range    BP Readings from Last 1 Encounters:  04/08/24 (!) 168/75         Passed - Cr in normal range and within 180 days    Creatinine  Date Value Ref Range Status  07/08/2014 0.8 0.6 - 1.1 mg/dL Final   Creat  Date Value Ref Range Status  09/24/2019 0.81 0.50 - 1.05 mg/dL Final    Comment:    For patients >41 years of age, the reference limit for Creatinine is approximately 13% higher for people identified as African-American. .    Creatinine, Ser  Date Value Ref Range Status  11/18/2023 0.94 0.57 - 1.00 mg/dL Final   Creatinine, Urine  Date Value Ref Range Status  12/14/2023 80 20 - 275 mg/dL Final         Passed - K in normal range and within 180 days    Potassium  Date Value Ref  Range Status  11/18/2023 4.6 3.5 - 5.2 mmol/L Final  07/08/2014 4.6 3.5 - 5.1 mEq/L Final         Passed - Patient is not pregnant      Passed - Valid encounter within last 6 months    Recent Outpatient Visits            2 months ago Primary hypertension   [facility] [place]. [facility] Theotis Haze ORN, NP   5 months ago Essential hypertension   [facility] [place]. [facility] Theotis Haze ORN, NP   9 months ago Chronic left-sided thoracic back pain   [place] [facility] [place] [facility] [place]. [facility] Theotis Haze ORN, NP   11 months ago Bronchitis   [place] [facility] [place] [facility] [place]. [facility] Theotis Haze ORN, NP   1 year ago Encounter for annual physical exam    [facility] [place] [place]. [facility] Theotis Haze ORN, [place]

## 2024-05-07 ENCOUNTER — Other Ambulatory Visit: Payer: Self-pay

## 2024-05-09 ENCOUNTER — Ambulatory Visit: Admitting: Internal Medicine

## 2024-05-09 ENCOUNTER — Ambulatory Visit: Admitting: Podiatry

## 2024-05-14 ENCOUNTER — Encounter: Payer: Self-pay | Admitting: Hematology

## 2024-05-14 ENCOUNTER — Other Ambulatory Visit: Payer: Self-pay

## 2024-05-14 DIAGNOSIS — M48062 Spinal stenosis, lumbar region with neurogenic claudication: Secondary | ICD-10-CM | POA: Diagnosis not present

## 2024-05-14 DIAGNOSIS — G8929 Other chronic pain: Secondary | ICD-10-CM | POA: Diagnosis not present

## 2024-05-14 DIAGNOSIS — M25561 Pain in right knee: Secondary | ICD-10-CM | POA: Diagnosis not present

## 2024-05-14 DIAGNOSIS — M25562 Pain in left knee: Secondary | ICD-10-CM | POA: Diagnosis not present

## 2024-05-14 MED ORDER — CYCLOBENZAPRINE HCL 5 MG PO TABS
5.0000 mg | ORAL_TABLET | Freq: Three times a day (TID) | ORAL | 2 refills | Status: DC | PRN
  Filled 2024-05-14: qty 60, 10d supply, fill #0

## 2024-05-14 MED ORDER — GABAPENTIN 300 MG PO CAPS
300.0000 mg | ORAL_CAPSULE | Freq: Three times a day (TID) | ORAL | 3 refills | Status: AC
  Filled 2024-05-14: qty 90, 15d supply, fill #0

## 2024-05-16 ENCOUNTER — Encounter: Payer: Self-pay | Admitting: Hematology

## 2024-05-16 ENCOUNTER — Other Ambulatory Visit: Payer: Self-pay | Admitting: Internal Medicine

## 2024-05-16 ENCOUNTER — Other Ambulatory Visit: Payer: Self-pay

## 2024-05-16 ENCOUNTER — Other Ambulatory Visit: Payer: Self-pay | Admitting: Family Medicine

## 2024-05-16 DIAGNOSIS — E119 Type 2 diabetes mellitus without complications: Secondary | ICD-10-CM

## 2024-05-16 DIAGNOSIS — K219 Gastro-esophageal reflux disease without esophagitis: Secondary | ICD-10-CM

## 2024-05-16 MED ORDER — DEXCOM G7 SENSOR MISC
2 refills | Status: DC
  Filled 2024-05-16: qty 9, 90d supply, fill #0
  Filled 2024-05-21: qty 3, 30d supply, fill #0

## 2024-05-16 MED ORDER — PANTOPRAZOLE SODIUM 40 MG PO TBEC
40.0000 mg | DELAYED_RELEASE_TABLET | Freq: Two times a day (BID) | ORAL | 0 refills | Status: DC
  Filled 2024-05-16 – 2024-05-27 (×2): qty 180, 90d supply, fill #0

## 2024-05-16 NOTE — Progress Notes (Deleted)
 Psychiatric Initial Adult Assessment  Patient Identification: Kristina Adams MRN:  [id number] Date of Evaluation:  05/16/2024 Referral Source: Haze Servant NP  Assessment:  Kristina Adams is a 58 y.o. female with a history of *** T2DM, HTN, chronic low back pain, IBS, iron  deficiency, and Vitamin D  deficiency who presents to [facility] via video conferencing for initial evaluation of ***.  Patient reports ***  Patient was made aware of this provider's departure from [facility] at the end of Nov 2025 and that she will be transitioned to alternative provider in the clinic after this time. All questions/concerns addressed.  RTC in ***  Plan:  # *** Past medication trials:  Status of problem: *** Interventions: -- *** -- Continue gabapentin  600 mg TID *** as prescribed by PCP -- Medical considerations:  -- CBC revealing for normocytic anemia 11/18/23  -- CMP grossly wnl 11/18/23  -- Vitamin D  wnl 11/18/23  -- TSH wnl 08/23/23  # *** Past medication trials:  Status of problem: *** Interventions: -- ***  # *** Past medication trials:  Status of problem: *** Interventions: -- ***  Patient was given contact information for behavioral health clinic and was instructed to call 911 for emergencies.   Subjective:  Chief Complaint: No chief complaint on file.   History of Present Illness:  ***  Chart review: -- Referred by PCP for anxiety, depression. -- Home psychotropics: ***   Hyperthyroidism? Taking vit d?    Past Psychiatric History:  Diagnoses: *** Medication trials: *** Previous psychiatrist/therapist: *** Hospitalizations: *** Suicide attempts: *** SIB: *** Hx of violence towards others: *** Current access to guns: *** Hx of trauma/abuse: ***  Previous Psychotropic Medications: {YES/NO:21197}  Substance Abuse History in the last 12 months:  {yes no:314532}  Past Medical History:  Past Medical History:  Diagnosis Date   Anemia  12/09/2011   child birth   Anxiety    Appendicitis, acute 12/03/2011   Arthritis    Chronic back pain 12/03/2011   Degenerative disc disease, cervical    low back area   Diabetes mellitus type 2, insulin  dependent (HCC) 12/03/2011   GERD (gastroesophageal reflux disease)    H/O hiatal hernia    HLD (hyperlipidemia)    Hypertension 12/03/2011   Hyperthyroidism    IBS (irritable bowel syndrome)    Morbid obesity with BMI of 45.0-49.9, adult (HCC) 12/03/2011   Sleep apnea 12/03/2011   last sleep study May 2013    Past Surgical History:  Procedure Laterality Date   BIOPSY  07/29/2022   Procedure: BIOPSY;  Surgeon: Wilhelmenia Aloha Raddle., MD;  Location: [facility];  Service: Gastroenterology;;   CESAREAN SECTION     x 2   COLONOSCOPY WITH PROPOFOL  N/A 07/29/2022   Procedure: COLONOSCOPY WITH PROPOFOL ;  Surgeon: Wilhelmenia Aloha Raddle., MD;  Location: [facility];  Service: Gastroenterology;  Laterality: N/A;   ESOPHAGOGASTRODUODENOSCOPY (EGD) WITH PROPOFOL  N/A 07/29/2022   Procedure: ESOPHAGOGASTRODUODENOSCOPY (EGD) WITH PROPOFOL ;  Surgeon: Wilhelmenia Aloha Raddle., MD;  Location: [facility];  Service: Gastroenterology;  Laterality: N/A;   ESOPHAGOGASTRODUODENOSCOPY (EGD) WITH PROPOFOL  N/A 09/05/2023   Procedure: ESOPHAGOGASTRODUODENOSCOPY (EGD) WITH PROPOFOL ;  Surgeon: Wilhelmenia Aloha Raddle., MD;  Location: [facility];  Service: Gastroenterology;  Laterality: N/A;   EUS N/A 07/29/2022   Procedure: UPPER ENDOSCOPIC ULTRASOUND (EUS) LINEAR;  Surgeon: Wilhelmenia Aloha Raddle., MD;  Location: [facility];  Service: Gastroenterology;  Laterality: N/A;   FINE NEEDLE ASPIRATION N/A 07/29/2022   Procedure: FINE NEEDLE ASPIRATION (FNA) LINEAR;  Surgeon: Wilhelmenia Aloha Raddle., MD;  Location: [facility];  Service: Gastroenterology;  Laterality: N/A;   FINE NEEDLE ASPIRATION N/A 09/05/2023   Procedure: FINE NEEDLE ASPIRATION (FNA) LINEAR;  Surgeon: Wilhelmenia Aloha Raddle., MD;  [facility];  Service: Gastroenterology;  Laterality: N/A;   LAPAROSCOPIC APPENDECTOMY  12/03/2011   Procedure: APPENDECTOMY LAPAROSCOPIC;  Surgeon: Redell Faith, DO;  Location: [facility];  Service: General;  Laterality: N/A;  drainage of intra-abdominal abscess    PARS PLANA VITRECTOMY Left 08/17/2012   Procedure: PARS PLANA VITRECTOMY WITH 25 GAUGE;  Surgeon: Norleen JONETTA Ku, MD;  [facility];  Service: Ophthalmology;  Laterality: Left;   POLYPECTOMY  07/29/2022   Procedure: POLYPECTOMY;  Surgeon: Mansouraty, Aloha Raddle., MD;  Location: [facility];  Service: Gastroenterology;;   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   Procedure: REPAIR OF COMPLEX TRACTION RETINAL DETACHMENT;  Surgeon: Norleen JONETTA Ku, MD;  Location[facility];  Service: Ophthalmology;  Laterality: Left;   TUBAL LIGATION     UPPER ESOPHAGEAL ENDOSCOPIC ULTRASOUND (EUS) N/A 09/05/2023   Procedure: UPPER ESOPHAGEAL ENDOSCOPIC ULTRASOUND (EUS);  Surgeon: Wilhelmenia Aloha Raddle., MD;  Location: [facility];  Service: Gastroenterology;  Laterality: N/A;    Family Psychiatric History: ***  Family History:  Family History  Problem Relation Age of Onset   Breast cancer Mother 81   Diabetes Mother    Hypertension Mother    Kidney failure Father    Heart disease Father        No details   Diabetes Sister    Hypertension Sister    Hyperlipidemia Sister    Diabetes Maternal Uncle    Cancer Maternal Uncle        type unknown   Diabetes Paternal Aunt    Hypertension Paternal Aunt    Breast cancer Paternal Aunt    Fibromyalgia Paternal Aunt    Breast cancer Maternal Grandmother    Hypertension Maternal Grandmother    Kidney disease Maternal Grandfather    Cancer Maternal Grandfather        type unknown   Diabetes Paternal Grandmother    Diabetes Paternal Grandfather    Diabetes Daughter    Colon cancer Neg Hx    Esophageal cancer Neg Hx    Inflammatory bowel disease Neg Hx    Liver disease Neg Hx     Pancreatic cancer Neg Hx    Rectal cancer Neg Hx    Stomach cancer Neg Hx     Social History:   Academic/Vocational: ***  Social History   Socioeconomic History   Marital status: Divorced    Spouse name: Not on file   Number of children: 2   Years of education: Not on file   Highest education level: Not on file  Occupational History   Not on file  Tobacco Use   Smoking status: Some Days    Types: Cigars    Passive exposure: Current   Smokeless tobacco: Never  Vaping Use   Vaping status: Never Used  Substance and Sexual Activity   Alcohol use: Not Currently    Alcohol/week: 1.0 standard drink of alcohol    Types: 1 Glasses of wine per week    Comment: occasional   Drug use: No   Sexual activity: Yes  Other Topics Concern   Not on file  Social History Narrative   Lives alone.    Social Drivers of Health   Financial Resource Strain: Patient Declined (05/16/2023)   Overall Physicist, Medical Strain (  CARDIA)    Difficulty of Paying Living Expenses: Patient declined  Food Insecurity: No Food Insecurity (05/16/2023)   Hunger Vital Sign    Worried About Running Out of Food in the Last Year: Never true    Ran Out of Food in the Last Year: Never true  Transportation Needs: No Transportation Needs (05/16/2023)   PRAPARE - Administrator, Civil Service (Medical): No    Lack of Transportation (Non-Medical): No  Physical Activity: Patient Declined (05/16/2023)   Exercise Vital Sign    Days of Exercise per Week: Patient declined    Minutes of Exercise per Session: Patient declined  Stress: No Stress Concern Present (05/16/2023)   [employer] of Occupational Health - Occupational Stress Questionnaire    Feeling of Stress : Not at all  Social Connections: Patient Declined (05/16/2023)   Social Connection and Isolation Panel    Frequency of Communication with Friends and Family: Patient declined    Frequency of Social Gatherings with Friends and Family: Patient  declined    Attends Religious Services: Patient declined    Database Administrator or Organizations: Patient declined    Attends Banker Meetings: Patient declined    Marital Status: Patient declined    Additional Social History: updated  Allergies:   Allergies  Allergen Reactions   Latex Other (See Comments)    Infection in left middle finger from latex gloves due to moisture.   Augmentin  [Amoxicillin -Pot Clavulanate] Itching    Burning and itching, back pains, elevated sugar levels   Iohexol       Code: RASH, Desc: PATIENT CALLED 06/17/08-STATED AFTER 6 HOURS POST IV CONTRAST, HANDS,SCALP AND GROIN AREA DEVELOPED ITCHING,BURNING SENSATION, Onset Date: [id number]     Current Medications: Current Outpatient Medications  Medication Sig Dispense Refill   albuterol  (VENTOLIN  HFA) 108 (90 Base) MCG/ACT inhaler Inhale 2 puffs into the lungs every 6 (six) hours as needed for wheezing or shortness of breath. (Patient taking differently: Inhale 2 puffs into the lungs every 6 (six) hours as needed for wheezing or shortness of breath. During allergy season) 18 g 0   baclofen (LIORESAL) 10 MG tablet Take 1 tablet (10 mg total) by mouth 3 (three) times daily. 30 each 0   blood glucose meter kit and supplies KIT Use up to 2 times daily as directed. (Patient not taking: Reported on 04/04/2024) 1 each 0   Blood Pressure Monitor DEVI Please provide patient with insurance approved blood pressure monitor 1 each 0   Cholecalciferol  (VITAMIN D -3) 125 MCG (5000 UT) TABS Take 1 tablet by mouth daily. 90 tablet 3   Continuous Glucose Sensor (DEXCOM G7 SENSOR) MISC Use to check blood sugar continuously throughout the day. E11.9 9 each 2   cyclobenzaprine  (FLEXERIL ) 10 MG tablet TAKE 1 TABLET (10 MG TOTAL) BY MOUTH 3 (THREE) TIMES DAILY AS NEEDED FOR MUSCLE SPASMS. 90 tablet 2   cyclobenzaprine  (FLEXERIL ) 5 MG tablet Take 1-2 tablets (5-10 mg total) by mouth 3 (three) times daily as needed for  spasm/pain. 60 tablet 2   diclofenac  (VOLTAREN ) 75 MG EC tablet Take 1 tablet (75 mg total) by mouth 2 (two) times daily. 30 tablet 3   Dulaglutide  (TRULICITY ) 1.5 MG/0.5ML SOAJ Inject 1.5 mg into the skin once a week. 6 mL 3   fenofibrate  (TRICOR ) 145 MG tablet Take 1 tablet (145 mg total) by mouth daily. 90 tablet 1   gabapentin  (NEURONTIN ) 100 MG capsule Take 1 capsule (100 mg) by mouth  2 (two) times daily with breakfast (at 8 AM) and lunch (at 2 PM) for 7 days. 14 capsule 0   gabapentin  (NEURONTIN ) 300 MG capsule Take 1 capsule (300 mg total) by mouth at bedtime. 270 capsule 2   gabapentin  (NEURONTIN ) 300 MG capsule Take 1-2 capsules (300-600 mg total) by mouth 3 (three) times daily. 90 capsule 3   glipiZIDE  (GLUCOTROL ) 10 MG tablet Take 2 tablets (20 mg total) by mouth 2 (two) times daily before a meal. 360 tablet 2   glucose blood (ONETOUCH VERIO) test strip Testing twice daily. (Patient not taking: Reported on 04/04/2024) 200 each 0   hydrochlorothiazide  (HYDRODIURIL ) 25 MG tablet Take 1 tablet (25 mg total) by mouth daily. 90 tablet 0   hyoscyamine  (LEVSIN ) 0.125 MG tablet Take 1 tablet (0.125 mg total) by mouth every 6 (six) hours as needed for cramping (diarrhea,nausea). (Patient not taking: Reported on 04/04/2024) 60 tablet 1   insulin  glargine (LANTUS  SOLOSTAR) 100 UNIT/ML Solostar Pen Inject 36 Units into the skin 2 (two) times daily. 75 mL 4   Insulin  Pen Needle (B-D UF III MINI PEN NEEDLES) 31G X 5 MM MISC Inject as directed in the morning and at bedtime. 200 each 3   Lancets (ONETOUCH DELICA PLUS LANCET33G) MISC Testing twice daily (Patient not taking: Reported on 04/04/2024) 200 each 1   linaclotide  (LINZESS ) 290 MCG CAPS capsule Take 1 capsule (290 mcg total) by mouth daily before breakfast. 90 capsule 2   lisinopril  (ZESTRIL ) 40 MG tablet Take 1 tablet (40 mg total) by mouth daily. 90 tablet 0   lubiprostone  (AMITIZA ) 24 MCG capsule Take 1 capsule (24 mcg total) by mouth 2 (two) times  daily with a meal. Start with 1 daily for 2 days then increase to 2 daily (Patient not taking: Reported on 04/04/2024) 28 capsule 0   methimazole  (TAPAZOLE ) 5 MG tablet Take 1 tablet (5 mg total) by mouth 2 (two) times daily. 180 tablet 3   Multiple Vitamin (MULTI-VITAMIN) tablet Take 1 tablet by mouth daily.     naproxen  (NAPROSYN ) 500 MG tablet Take 1 tablet (500 mg total) by mouth 2 (two) times daily with a meal. Take with food to avoid stomach upset. Do not take any additional NSAIDs while on this. You may take tylenol  in addition to this if needed for extra pain relief. 20 tablet 0   pantoprazole  (PROTONIX ) 40 MG tablet Take 1 tablet (40 mg total) by mouth 2 (two) times daily. 180 tablet 0   rosuvastatin  (CRESTOR ) 5 MG tablet Take 1 tablet (5 mg total) by mouth daily. 90 tablet 0   simethicone  (MYLICON) 80 MG chewable tablet Chew 1 tablet (80 mg total) by mouth every 6 (six) hours as needed for flatulence. 30 tablet 1   vitamin C  (ASCORBIC ACID ) 500 MG tablet Take 1,000 mg by mouth daily.     No current facility-administered medications for this visit.    ROS: Review of Systems  Objective:  Psychiatric Specialty Exam: Last menstrual period 08/09/2012.There is no height or weight on file to calculate BMI.  General Appearance: {Appearance:22683}  Eye Contact:  {BHH EYE CONTACT:22684}  Speech:  {Speech:22685}  Volume:  {Volume (PAA):22686}  Mood:  {BHH MOOD:22306}  Affect:  {Affect (PAA):22687}  Thought Content: {Thought Content:22690}   Suicidal Thoughts:  {ST/HT (PAA):22692}  Homicidal Thoughts:  {ST/HT (PAA):22692}  Thought Process:  {Thought Process (PAA):22688}  Orientation:  {BHH ORIENTATION (PAA):22689}    Memory: Grossly intact ***  Judgment:  {Judgement (PAA):22694}  Insight:  {Insight (PAA):22695}  Concentration:  {Concentration:21399}  Recall:  not formally assessed ***  Fund of Knowledge: {BHH GOOD/FAIR/POOR:22877}  Language: {BHH GOOD/FAIR/POOR:22877}  Psychomotor  Activity:  {Psychomotor (PAA):22696}  Akathisia:  {BHH YES OR NO:22294}  AIMS (if indicated): {Desc; done/not:10129}  Assets:  {Assets (PAA):22698}  ADL's:  {BHH JIO'D:77709}  Cognition: {chl bhh cognition:304700322}  Sleep:  {BHH GOOD/FAIR/POOR:22877}   PE: General: sits comfortably in view of camera; no acute distress *** Pulm: no increased work of breathing on room air *** MSK: all extremity movements appear intact *** Neuro: no focal neurological deficits observed *** Gait & Station: unable to assess by video ***   Metabolic Disorder Labs: Lab Results  Component Value Date   HGBA1C 7.7 (A) 12/14/2023   MPG 243 09/24/2019   No results found for: PROLACTIN Lab Results  Component Value Date   CHOL 149 04/14/2023   TRIG 129 04/14/2023   HDL 51 04/14/2023   CHOLHDL 2.9 04/14/2023   LDLCALC 75 04/14/2023   LDLCALC 59 03/01/2023   Lab Results  Component Value Date   TSH 0.75 08/23/2023    Therapeutic Level Labs: No results found for: LITHIUM No results found for: CBMZ No results found for: VALPROATE  Screenings:  GAD-7    Flowsheet Row Office Visit from 02/17/2024 in [facility] - A Dept Of [place]. [facility] Office Visit from 11/18/2023 in [facility] [place] - A Dept Of [place]. [facility] Office Visit from 08/03/2023 in [facility] [place] - A Dept Of [place]. [facility] Office Visit from 05/16/2023 in [facility] [place] - A Dept Of [place]. [facility] Office Visit from 03/01/2023 in [facility] - A Dept Of [place]. [facility]  Total GAD-7 Score 8 4 6 4 4    PHQ2-9    Flowsheet Row Office Visit from 02/17/2024 in [facility] [place] - A Dept Of [place]. [facility] Office Visit from 11/18/2023 in [facility] - A Dept Of [place]. [facility] Office Visit from 08/03/2023 in [facility] [place] - A Dept Of [place]. [facility] Office Visit from 05/16/2023 in [facility] [place] - A Dept Of [place]. [facility] Office Visit from 03/01/2023 in [facility] [place] - A Dept Of [place]. [facility]  PHQ-2 Total Score 1 4 3 1  0  PHQ-9 Total Score 6 14 13 9 6    Flowsheet Row UC from 04/08/2024 in [facility]) UC from 04/04/2024 in [facility]) Admission (Discharged) from 09/05/2023 in [facility] ENDOSCOPY  C-SSRS RISK CATEGORY No Risk No Risk No Risk    Collaboration of Care: Collaboration of Care: [facility] OP Collaboration of Rjmz:[id number]}  Patient/Guardian was advised Release of Information must be obtained prior to any record release in order to collaborate their care with an outside provider. Patient/Guardian was advised if they have not already done so to contact the registration department to sign all necessary forms in order for us  to release information regarding their care.   Consent: Patient/Guardian gives verbal consent for treatment and assignment of benefits for services provided during this visit. Patient/Guardian expressed understanding and agreed to proceed.   Televisit via video: I connected with Kristina Adams  on 05/16/24 at 10:00 AM EST by a video enabled telemedicine application and verified that I am speaking with the correct person using two identifiers.  Location: Patient: *** Provider: remote office in [place]   I discussed the limitations of evaluation and management by telemedicine and the availability of in person appointments. The patient expressed understanding and agreed to proceed.  I discussed the assessment and treatment plan with the patient. The patient was provided an opportunity to ask questions and all were answered. The patient agreed with the plan and demonstrated an understanding of the  instructions.   The patient was advised to call back or seek an in-person evaluation if the symptoms worsen or if the condition fails to improve as anticipated.  I provided *** minutes dedicated to the care of this patient via video on the date of this encounter to include chart review, face-to-face time with the patient, medication management/counseling ***.  [place] Scheiderer A Janneth Krasner 11/5/20252:09 PM

## 2024-05-18 ENCOUNTER — Other Ambulatory Visit: Payer: Self-pay

## 2024-05-18 ENCOUNTER — Encounter: Payer: Self-pay | Admitting: Hematology

## 2024-05-19 ENCOUNTER — Telehealth (HOSPITAL_COMMUNITY): Payer: Self-pay | Admitting: Psychiatry

## 2024-05-21 ENCOUNTER — Ambulatory Visit: Admitting: Nurse Practitioner

## 2024-05-21 ENCOUNTER — Other Ambulatory Visit: Payer: Self-pay

## 2024-05-21 ENCOUNTER — Encounter: Payer: Self-pay | Admitting: Hematology

## 2024-05-22 ENCOUNTER — Other Ambulatory Visit: Payer: Self-pay

## 2024-05-22 DIAGNOSIS — M542 Cervicalgia: Secondary | ICD-10-CM | POA: Diagnosis not present

## 2024-05-22 DIAGNOSIS — M48062 Spinal stenosis, lumbar region with neurogenic claudication: Secondary | ICD-10-CM | POA: Diagnosis not present

## 2024-05-22 DIAGNOSIS — G8929 Other chronic pain: Secondary | ICD-10-CM | POA: Diagnosis not present

## 2024-05-22 DIAGNOSIS — M25562 Pain in left knee: Secondary | ICD-10-CM | POA: Diagnosis not present

## 2024-05-23 ENCOUNTER — Other Ambulatory Visit: Payer: Self-pay

## 2024-05-24 NOTE — Progress Notes (Deleted)
 Chief Complaint: Follow-up constipation  HPI:    Kristina Adams is a 58 year old African-American female, known to Dr. 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 pylori, colon polyps (TA's), hiatal hernia and multiple others, who was referred to me by Theotis Haze ORN, NP for a complaint of constipation.    09/01/2023 EUS with EGD showing irregular Z-line, subepithelial nodule found in the esophagus noted distally, 3 cm hiatal hernia, EUS with an intramural subepithelial lesion in thoracic esophagus appeared to originate from within the deep mucosa, cytology pending.  Look like a stromal cell lesion.  Atypical cells were present.  At that time recommended repeat EUS in 2 years.    01/27/2024 patient seen in clinic by Dr. Wilhelmenia for chronic constipation.  At that visit continued on a twice daily PPI.  Recommended to repeat upper EUS for surveillance in 2 years.  Fiber twice daily.  Start Trulance versus Ibsrela.  Linzess  290 mcg daily was not working.  Consider SIBO breath test and anorectal manometry.    02/07/2024 patient changed to Amitiza  24 mcg twice daily.  Past Medical History:  Diagnosis Date   Anemia 12/09/2011   child birth   Anxiety    Appendicitis, acute 12/03/2011   Arthritis    Chronic back pain 12/03/2011   Degenerative disc disease, cervical    low back area   Diabetes mellitus type 2, insulin  dependent (HCC) 12/03/2011   GERD (gastroesophageal reflux disease)    H/O hiatal hernia    HLD (hyperlipidemia)    Hypertension 12/03/2011   Hyperthyroidism    IBS (irritable bowel syndrome)    Morbid obesity with BMI of 45.0-49.9, adult (HCC) 12/03/2011   Sleep apnea 12/03/2011   last sleep study May 2013    Past Surgical History:  Procedure Laterality Date   BIOPSY  07/29/2022   Procedure: BIOPSY;  Surgeon:  Wilhelmenia Aloha Raddle., MD;  Location: [facility];  Service: Gastroenterology;;   CESAREAN SECTION     x 2   COLONOSCOPY WITH PROPOFOL  N/A 07/29/2022   Procedure: COLONOSCOPY WITH PROPOFOL ;  Surgeon: Wilhelmenia Aloha Raddle., MD;  Location: [facility];  Service: Gastroenterology;  Laterality: N/A;   ESOPHAGOGASTRODUODENOSCOPY (EGD) WITH PROPOFOL  N/A 07/29/2022   Procedure: ESOPHAGOGASTRODUODENOSCOPY (EGD) WITH PROPOFOL ;  Surgeon: Wilhelmenia Aloha Raddle., MD;  Location: [facility];  Service: Gastroenterology;  Laterality: N/A;   ESOPHAGOGASTRODUODENOSCOPY (EGD) WITH PROPOFOL  N/A 09/05/2023   Procedure: ESOPHAGOGASTRODUODENOSCOPY (EGD) WITH PROPOFOL ;  Surgeon: Wilhelmenia Aloha Raddle., MD;  Location: [facility];  Service: Gastroenterology;  Laterality: N/A;   EUS N/A 07/29/2022   Procedure: UPPER ENDOSCOPIC ULTRASOUND (EUS) LINEAR;  Surgeon: Wilhelmenia Aloha Raddle., MD;  Location: [facility];  Service: Gastroenterology;  Laterality: N/A;   FINE NEEDLE ASPIRATION N/A 07/29/2022   Procedure: FINE NEEDLE ASPIRATION (FNA) LINEAR;  Surgeon: Wilhelmenia Aloha Raddle., MD;  Location: [facility];  Service: Gastroenterology;  Laterality: N/A;   FINE NEEDLE ASPIRATION N/A 09/05/2023   Procedure: FINE NEEDLE ASPIRATION (FNA) LINEAR;  Surgeon: Wilhelmenia Aloha Raddle., MD;  Location: [facility];  Service: Gastroenterology;  Laterality: N/A;   LAPAROSCOPIC APPENDECTOMY  12/03/2011   Procedure: APPENDECTOMY LAPAROSCOPIC;  Surgeon: Redell Faith, DO;  Location: [facility];  Service: General;  Laterality: N/A;  drainage of intra-abdominal abscess    PARS PLANA VITRECTOMY Left 08/17/2012   Procedure: PARS PLANA VITRECTOMY WITH 25 GAUGE;  Surgeon: Norleen JONETTA Ku, MD;  Location:  [facility];  Service: Ophthalmology;  Laterality: Left;   POLYPECTOMY  07/29/2022   Procedure: POLYPECTOMY;  Surgeon: Mansouraty, Aloha Raddle., MD;  Location: [facility];  Service: Gastroenterology;;   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   Procedure: REPAIR OF COMPLEX TRACTION RETINAL DETACHMENT;  Surgeon: Norleen JONETTA Ku, MD;  Location: [facility];  Service: Ophthalmology;  Laterality: Left;   TUBAL LIGATION     UPPER ESOPHAGEAL ENDOSCOPIC ULTRASOUND (EUS) N/A 09/05/2023   Procedure: UPPER ESOPHAGEAL ENDOSCOPIC ULTRASOUND (EUS);  Surgeon: Wilhelmenia Aloha Raddle., MD;  Location: [facility];  Service: Gastroenterology;  Laterality: N/A;    Current Outpatient Medications  Medication Sig Dispense Refill   albuterol  (VENTOLIN  HFA) 108 (90 Base) MCG/ACT inhaler Inhale 2 puffs into the lungs every 6 (six) hours as needed for wheezing or shortness of breath. (Patient taking differently: Inhale 2 puffs into the lungs every 6 (six) hours as needed for wheezing or shortness of breath. During allergy season) 18 g 0   baclofen (LIORESAL) 10 MG tablet Take 1 tablet (10 mg total) by mouth 3 (three) times daily. 30 each 0   blood glucose meter kit and supplies KIT Use up to 2 times daily as directed. (Patient not taking: Reported on 04/04/2024) 1 each 0   Blood Pressure Monitor DEVI Please provide patient with insurance approved blood pressure monitor 1 each 0   Cholecalciferol  (VITAMIN D -3) 125 MCG (5000 UT) TABS Take 1 tablet by mouth daily. 90 tablet 3   Continuous Glucose Sensor (DEXCOM G7 SENSOR) MISC Use to check blood sugar continuously throughout the day. E11.9 9 each 2   cyclobenzaprine  (FLEXERIL ) 10 MG tablet TAKE 1 TABLET (10 MG TOTAL) BY MOUTH 3 (THREE) TIMES DAILY AS NEEDED FOR MUSCLE SPASMS. 90 tablet 2   cyclobenzaprine  (FLEXERIL ) 5 MG tablet Take 1-2 tablets (5-10 mg total) by mouth 3 (three) times daily as needed for spasm/pain. 60 tablet 2   diclofenac  (VOLTAREN ) 75 MG EC tablet Take 1 tablet (75 mg total) by mouth 2 (two) times daily. 30 tablet 3   Dulaglutide  (TRULICITY ) 1.5 MG/0.5ML SOAJ Inject 1.5 mg into the skin once a week. 6 mL 3   fenofibrate  (TRICOR ) 145 MG tablet Take 1 tablet (145 mg total) by  mouth daily. 90 tablet 1   gabapentin  (NEURONTIN ) 100 MG capsule Take 1 capsule (100 mg) by mouth 2 (two) times daily with breakfast (at 8 AM) and lunch (at 2 PM) for 7 days. 14 capsule 0   gabapentin  (NEURONTIN ) 300 MG capsule Take 1 capsule (300 mg total) by mouth at bedtime. 270 capsule 2   gabapentin  (NEURONTIN ) 300 MG capsule Take 1-2 capsules (300-600 mg total) by mouth 3 (three) times daily. 90 capsule 3   glipiZIDE  (GLUCOTROL ) 10 MG tablet Take 2 tablets (20 mg total) by mouth 2 (two) times daily before a meal. 360 tablet 2   glucose blood (ONETOUCH VERIO) test strip Testing twice daily. (Patient not taking: Reported on 04/04/2024) 200 each 0   hydrochlorothiazide  (HYDRODIURIL ) 25 MG tablet Take 1 tablet (25 mg total) by mouth daily. 90 tablet 0   hyoscyamine  (LEVSIN ) 0.125 MG tablet Take 1 tablet (0.125 mg total) by mouth every 6 (six) hours as needed for cramping (diarrhea,nausea). (Patient not taking: Reported on 04/04/2024) 60 tablet 1   insulin  glargine (LANTUS  SOLOSTAR) 100 UNIT/ML Solostar Pen Inject 36 Units into the skin 2 (two) times daily. 75 mL 4   Insulin  Pen Needle (B-D UF III MINI PEN NEEDLES)  31G X 5 MM MISC Inject as directed in the morning and at bedtime. 200 each 3   Lancets (ONETOUCH DELICA PLUS LANCET33G) MISC Testing twice daily (Patient not taking: Reported on 04/04/2024) 200 each 1   linaclotide  (LINZESS ) 290 MCG CAPS capsule Take 1 capsule (290 mcg total) by mouth daily before breakfast. 90 capsule 2   lisinopril  (ZESTRIL ) 40 MG tablet Take 1 tablet (40 mg total) by mouth daily. 90 tablet 0   lubiprostone  (AMITIZA ) 24 MCG capsule Take 1 capsule (24 mcg total) by mouth 2 (two) times daily with a meal. Start with 1 daily for 2 days then increase to 2 daily (Patient not taking: Reported on 04/04/2024) 28 capsule 0   methimazole  (TAPAZOLE ) 5 MG tablet Take 1 tablet (5 mg total) by mouth 2 (two) times daily. 180 tablet 3   Multiple Vitamin (MULTI-VITAMIN) tablet Take 1 tablet  by mouth daily.     naproxen  (NAPROSYN ) 500 MG tablet Take 1 tablet (500 mg total) by mouth 2 (two) times daily with a meal. Take with food to avoid stomach upset. Do not take any additional NSAIDs while on this. You may take tylenol  in addition to this if needed for extra pain relief. 20 tablet 0   pantoprazole  (PROTONIX ) 40 MG tablet Take 1 tablet (40 mg total) by mouth 2 (two) times daily. 180 tablet 0   rosuvastatin  (CRESTOR ) 5 MG tablet Take 1 tablet (5 mg total) by mouth daily. 90 tablet 0   simethicone  (MYLICON) 80 MG chewable tablet Chew 1 tablet (80 mg total) by mouth every 6 (six) hours as needed for flatulence. 30 tablet 1   vitamin C  (ASCORBIC ACID ) 500 MG tablet Take 1,000 mg by mouth daily.     No current facility-administered medications for this visit.    Allergies as of 05/25/2024 - Review Complete 04/08/2024  Allergen Reaction Noted   Latex Other (See Comments) 10/02/2014   Augmentin  [amoxicillin -pot clavulanate] Itching 11/03/2022   Iohexol   06/17/2008    Family History  Problem Relation Age of Onset   Breast cancer Mother 89   Diabetes Mother    Hypertension Mother    Kidney failure Father    Heart disease Father        No details   Diabetes Sister    Hypertension Sister    Hyperlipidemia Sister    Diabetes Maternal Uncle    Cancer Maternal Uncle        type unknown   Diabetes Paternal Aunt    Hypertension Paternal Aunt    Breast cancer Paternal Aunt    Fibromyalgia Paternal Aunt    Breast cancer Maternal Grandmother    Hypertension Maternal Grandmother    Kidney disease Maternal Grandfather    Cancer Maternal Grandfather        type unknown   Diabetes Paternal Grandmother    Diabetes Paternal Grandfather    Diabetes Daughter    Colon cancer Neg Hx    Esophageal cancer Neg Hx    Inflammatory bowel disease Neg Hx    Liver disease Neg Hx    Pancreatic cancer Neg Hx    Rectal cancer Neg Hx    Stomach cancer Neg Hx     Social History    Socioeconomic History   Marital status: Divorced    Spouse name: Not on file   Number of children: 2   Years of education: Not on file   Highest education level: Not on file  Occupational History   Not  on file  Tobacco Use   Smoking status: Some Days    Types: Cigars    Passive exposure: Current   Smokeless tobacco: Never  Vaping Use   Vaping status: Never Used  Substance and Sexual Activity   Alcohol use: Not Currently    Alcohol/week: 1.0 standard drink of alcohol    Types: 1 Glasses of wine per week    Comment: occasional   Drug use: No   Sexual activity: Yes  Other Topics Concern   Not on file  Social History Narrative   Lives alone.    Social Drivers of Health   Financial Resource Strain: Patient Declined (05/16/2023)   Overall Financial Resource Strain (CARDIA)    Difficulty of Paying Living Expenses: Patient declined  Food Insecurity: No Food Insecurity (05/16/2023)   Hunger Vital Sign    Worried About Running Out of Food in the Last Year: Never true    Ran Out of Food in the Last Year: Never true  Transportation Needs: No Transportation Needs (05/16/2023)   PRAPARE - Administrator, Civil Service (Medical): No    Lack of Transportation (Non-Medical): No  Physical Activity: Patient Declined (05/16/2023)   Exercise Vital Sign    Days of Exercise per Week: Patient declined    Minutes of Exercise per Session: Patient declined  Stress: No Stress Concern Present (05/16/2023)   [employer] of Occupational Health - Occupational Stress Questionnaire    Feeling of Stress : Not at all  Social Connections: Patient Declined (05/16/2023)   Social Connection and Isolation Panel    Frequency of Communication with Friends and Family: Patient declined    Frequency of Social Gatherings with Friends and Family: Patient declined    Attends Religious Services: Patient declined    Database Administrator or Organizations: Patient declined    Attends Tax Inspector Meetings: Patient declined    Marital Status: Patient declined  Intimate Partner Violence: Not At Risk (05/16/2023)   Humiliation, Afraid, Rape, and Kick questionnaire    Fear of Current or Ex-Partner: No    Emotionally Abused: No    Physically Abused: No    Sexually Abused: No    Review of Systems:    Constitutional: No weight loss, fever, chills, weakness or fatigue HEENT: Eyes: No change in vision               Ears, Nose, Throat:  No change in hearing or congestion Skin: No rash or itching Cardiovascular: No chest pain, chest pressure or palpitations   Respiratory: No SOB or cough Gastrointestinal: See HPI and otherwise negative Genitourinary: No dysuria or change in urinary frequency Neurological: No headache, dizziness or syncope Musculoskeletal: No new muscle or joint pain Hematologic: No bleeding or bruising Psychiatric: No history of depression or anxiety    Physical Exam:  Vital signs: LMP 08/09/2012   Constitutional:   Pleasant Caucasian female appears to be in NAD, Well developed, Well nourished, alert and cooperative Head:  Normocephalic and atraumatic. Eyes:   PEERL, EOMI. No icterus. Conjunctiva pink. Ears:  Normal auditory acuity. Neck:  Supple Throat: Oral cavity and pharynx without inflammation, swelling or lesion.  Respiratory: Respirations even and unlabored. Lungs clear to auscultation bilaterally.   No wheezes, crackles, or rhonchi.  Cardiovascular: Normal S1, S2. No MRG. Regular rate and rhythm. No peripheral edema, cyanosis or pallor.  Gastrointestinal:  Soft, nondistended, nontender. No rebound or guarding. Normal bowel sounds. No appreciable masses or hepatomegaly. Rectal:  Not  performed.  Msk:  Symmetrical without gross deformities. Without edema, no deformity or joint abnormality.  Neurologic:  Alert and  oriented x4;  grossly normal neurologically.  Skin:   Dry and intact without significant lesions or rashes. Psychiatric: Oriented to  person, place and time. Demonstrates good judgement and reason without abnormal affect or behaviors.  RELEVANT LABS AND IMAGING: CBC    Component Value Date/Time   WBC 6.2 11/18/2023 1546   WBC 5.1 05/31/2022 0929   RBC 3.90 11/18/2023 1546   RBC 3.77 (L) 05/31/2022 0929   HGB 10.6 (L) 11/18/2023 1546   HGB 10.9 (L) 05/04/2016 0821   HCT 33.5 (L) 11/18/2023 1546   HCT 33.6 (L) 05/04/2016 0821   PLT 348 11/18/2023 1546   MCV 86 11/18/2023 1546   MCV 85.1 05/04/2016 0821   MCH 27.2 11/18/2023 1546   MCH 28.1 05/31/2022 0929   MCHC 31.6 11/18/2023 1546   MCHC 31.9 05/31/2022 0929   RDW 14.2 11/18/2023 1546   RDW 13.3 05/04/2016 0821   LYMPHSABS 1.7 11/18/2023 1546   LYMPHSABS 1.1 05/04/2016 0821   MONOABS 0.5 10/07/2017 1018   MONOABS 0.4 05/04/2016 0821   EOSABS 0.2 11/18/2023 1546   BASOSABS 0.1 11/18/2023 1546   BASOSABS 0.1 05/04/2016 0821    CMP     Component Value Date/Time   NA 139 11/18/2023 1546   NA 138 07/08/2014 1306   K 4.6 11/18/2023 1546   K 4.6 07/08/2014 1306   CL 99 11/18/2023 1546   CO2 24 11/18/2023 1546   CO2 28 07/08/2014 1306   GLUCOSE 180 (H) 11/18/2023 1546   GLUCOSE 226 (H) 11/24/2021 1559   GLUCOSE 239 (H) 07/08/2014 1306   BUN 19 11/18/2023 1546   BUN 13.4 07/08/2014 1306   CREATININE 0.94 11/18/2023 1546   CREATININE 0.81 09/24/2019 0901   CREATININE 0.8 07/08/2014 1306   CALCIUM  9.3 11/18/2023 1546   CALCIUM  8.8 07/08/2014 1306   PROT 6.8 11/18/2023 1546   PROT 6.8 07/08/2014 1306   ALBUMIN 4.4 11/18/2023 1546   ALBUMIN 3.6 07/08/2014 1306   AST 15 11/18/2023 1546   AST 10 07/08/2014 1306   ALT 9 11/18/2023 1546   ALT 13 07/08/2014 1306   ALKPHOS 61 11/18/2023 1546   ALKPHOS 63 07/08/2014 1306   BILITOT 0.2 11/18/2023 1546   BILITOT 0.20 07/08/2014 1306   GFRNONAA >90 08/17/2012 1209   GFRAA >90 08/17/2012 1209    Assessment: 1.  Chronic constipation: Likely related to IBS-C 2.  Abdominal cramping 3.  Abdominal  bloating 4.  Nodule esophagus 5.  Morbid obesity  Plan: 1. ***     Delon Failing, PA-C [facility] 05/24/2024, 8:25 AM  Cc: Theotis Haze ORN, NP

## 2024-05-25 ENCOUNTER — Ambulatory Visit: Admitting: Physician Assistant

## 2024-05-25 ENCOUNTER — Other Ambulatory Visit: Payer: Self-pay

## 2024-05-28 ENCOUNTER — Encounter: Payer: Self-pay | Admitting: Hematology

## 2024-05-28 ENCOUNTER — Other Ambulatory Visit: Payer: Self-pay

## 2024-05-29 ENCOUNTER — Inpatient Hospital Stay: Admitting: Nurse Practitioner

## 2024-05-29 ENCOUNTER — Other Ambulatory Visit: Payer: Self-pay

## 2024-05-31 ENCOUNTER — Other Ambulatory Visit: Payer: Self-pay

## 2024-06-01 ENCOUNTER — Other Ambulatory Visit: Payer: Self-pay

## 2024-06-05 ENCOUNTER — Other Ambulatory Visit: Payer: Self-pay | Admitting: Family Medicine

## 2024-06-05 ENCOUNTER — Other Ambulatory Visit: Payer: Self-pay | Admitting: Nurse Practitioner

## 2024-06-05 ENCOUNTER — Telehealth: Payer: Self-pay | Admitting: Internal Medicine

## 2024-06-05 ENCOUNTER — Other Ambulatory Visit: Payer: Self-pay

## 2024-06-05 ENCOUNTER — Telehealth: Payer: Self-pay

## 2024-06-05 ENCOUNTER — Other Ambulatory Visit (HOSPITAL_COMMUNITY): Payer: Self-pay

## 2024-06-05 ENCOUNTER — Ambulatory Visit: Admitting: Internal Medicine

## 2024-06-05 ENCOUNTER — Encounter: Payer: Self-pay | Admitting: Hematology

## 2024-06-05 ENCOUNTER — Other Ambulatory Visit

## 2024-06-05 VITALS — BP 150/62 | HR 81 | Ht 70.0 in | Wt 295.0 lb

## 2024-06-05 DIAGNOSIS — E785 Hyperlipidemia, unspecified: Secondary | ICD-10-CM

## 2024-06-05 DIAGNOSIS — E1142 Type 2 diabetes mellitus with diabetic polyneuropathy: Secondary | ICD-10-CM

## 2024-06-05 DIAGNOSIS — K219 Gastro-esophageal reflux disease without esophagitis: Secondary | ICD-10-CM

## 2024-06-05 DIAGNOSIS — I10 Essential (primary) hypertension: Secondary | ICD-10-CM

## 2024-06-05 DIAGNOSIS — E0590 Thyrotoxicosis, unspecified without thyrotoxic crisis or storm: Secondary | ICD-10-CM | POA: Diagnosis not present

## 2024-06-05 DIAGNOSIS — Z794 Long term (current) use of insulin: Secondary | ICD-10-CM

## 2024-06-05 DIAGNOSIS — E119 Type 2 diabetes mellitus without complications: Secondary | ICD-10-CM

## 2024-06-05 DIAGNOSIS — E042 Nontoxic multinodular goiter: Secondary | ICD-10-CM | POA: Diagnosis not present

## 2024-06-05 LAB — POCT GLYCOSYLATED HEMOGLOBIN (HGB A1C): Hemoglobin A1C: 8.1 % — AB (ref 4.0–5.6)

## 2024-06-05 LAB — BASIC METABOLIC PANEL WITH GFR
BUN: 19 mg/dL (ref 7–25)
CO2: 31 mmol/L (ref 20–32)
Calcium: 9.5 mg/dL (ref 8.6–10.4)
Chloride: 103 mmol/L (ref 98–110)
Creat: 0.89 mg/dL (ref 0.50–1.03)
Glucose, Bld: 87 mg/dL (ref 65–139)
Potassium: 4.2 mmol/L (ref 3.5–5.3)
Sodium: 143 mmol/L (ref 135–146)
eGFR: 75 mL/min/1.73m2 (ref >=60)

## 2024-06-05 LAB — POCT GLUCOSE (DEVICE FOR HOME USE): POC Glucose: 80 mg/dL (ref 70–99)

## 2024-06-05 LAB — LIPID PANEL
Cholesterol: 129 mg/dL (ref <200)
HDL: 60 mg/dL (ref >=50)
LDL Cholesterol (Calc): 53 mg/dL
Non-HDL Cholesterol (Calc): 69 mg/dL (ref <130)
Total CHOL/HDL Ratio: 2.2 (calc) (ref <5.0)
Triglycerides: 79 mg/dL (ref <150)

## 2024-06-05 LAB — TSH: TSH: 0.74 m[IU]/L (ref 0.40–4.50)

## 2024-06-05 LAB — T4, FREE: Free T4: 1.2 ng/dL (ref 0.8–1.8)

## 2024-06-05 MED ORDER — PANTOPRAZOLE SODIUM 40 MG PO TBEC
40.0000 mg | DELAYED_RELEASE_TABLET | Freq: Two times a day (BID) | ORAL | 0 refills | Status: AC
  Filled 2024-06-05: qty 180, 90d supply, fill #0

## 2024-06-05 MED ORDER — DEXCOM G7 SENSOR MISC
2 refills | Status: DC
  Filled 2024-06-05: qty 3, 30d supply, fill #0

## 2024-06-05 MED ORDER — ROSUVASTATIN CALCIUM 5 MG PO TABS
5.0000 mg | ORAL_TABLET | Freq: Every day | ORAL | 0 refills | Status: AC
  Filled 2024-06-05 – 2024-07-31 (×2): qty 30, 30d supply, fill #0

## 2024-06-05 MED ORDER — LISINOPRIL 40 MG PO TABS
40.0000 mg | ORAL_TABLET | Freq: Every day | ORAL | 0 refills | Status: AC
  Filled 2024-06-05 – 2024-07-31 (×2): qty 30, 30d supply, fill #0

## 2024-06-05 MED ORDER — HYDROCHLOROTHIAZIDE 25 MG PO TABS
25.0000 mg | ORAL_TABLET | Freq: Every day | ORAL | 0 refills | Status: AC
  Filled 2024-06-05 – 2024-07-31 (×2): qty 30, 30d supply, fill #0

## 2024-06-05 MED ORDER — LANTUS SOLOSTAR 100 UNIT/ML ~~LOC~~ SOPN
30.0000 [IU] | PEN_INJECTOR | Freq: Two times a day (BID) | SUBCUTANEOUS | 4 refills | Status: DC
  Filled 2024-06-05: qty 60, 100d supply, fill #0
  Filled 2024-06-08 – 2024-07-09 (×2): qty 54, 90d supply, fill #0
  Filled 2024-07-18: qty 18, 30d supply, fill #0

## 2024-06-05 NOTE — Telephone Encounter (Signed)
 Can you please proceed with prior authorization for Dexcom?   Thank you

## 2024-06-05 NOTE — Telephone Encounter (Signed)
 Pharmacy Patient Advocate Encounter   Received notification from Pt Calls Messages that prior authorization for Dexcom G7 sensor is required/requested.   Insurance verification completed.   The patient is insured through [employer].   Per test claim: PA required; PA submitted to above mentioned insurance via Latent Key/confirmation #/EOC [id number] Status is pending

## 2024-06-05 NOTE — Progress Notes (Unsigned)
 Name: Kristina Adams  MRN/ DOB: [id number], 11/19/1965   Age/ Sex: 58 y.o., female    PCP: Theotis Haze ORN, NP   Reason for Endocrinology Evaluation: Type 2 Diabetes Mellitus     Date of Initial Endocrinology Visit: 04/18/2023    PATIENT IDENTIFIER: Kristina Adams is a 58 y.o. female with a past medical history of DM, HTN, IBS, GERD. The patient presented for initial endocrinology clinic visit on 04/18/2023 for consultative assistance with her diabetes management.    HPI: Kristina Adams was    Diagnosed with DM 1988 Prior Medications tried/Intolerance: Metformin  - diarrhea . Jardiance- yeast infection . Ozempic  -constipation                   Hemoglobin A1c has ranged from 7.9% in 2022, peaking at 10.1% in 2024.   On her initial visit to our clinic she had an A1c of 9.4%, she was on glipizide , Lantus .  She had Ozempic  on her medication list but she was not taking it due to constipation.  I started on Mounjaro , increase glipizide  and decrease Lantus   Insurance did not cover Mounjaro  and was switched to Trulicity     THYROID  HISTORY: Patient was diagnosed with multinodular goiter in 2007.She is s/p benign FNA of the right inferior nodule 3.1 cm , isthmic nodule 2.4 cm 08/18/2011 She is also s/p benign FNA of the right mid 2.1 cm nodule and left superior 2 cm nodule on/05/2017  She has also been diagnosed with hyperthyroidism and has been on methimazole  for years   She is s/p benign FNA of the left 4.7 cm thyroid  nodule 09/2023  SUBJECTIVE:   During the last visit (12/14/2023): A1c 7.7%     Today (06/05/24): Kristina Adams is here for follow-up on diabetes management.  She checks her blood sugars multiple times daily through CGM. The patient has had hypoglycemic episodes since the last clinic visit, she is symptomatic.   Patient follows with [facility] for the variable arthralgias and spinal stenosis. She received intra-articular injection 11/3rd.  Continues with Linzess  for IBS-C   No local neck swelling  No palpitations  Pt has been noted with weight loss    HOME ENDOCRINE REGIMEN: Glipizide  10 mg , 2 tabs BID Trulicity  1.5 mg weekly Lantus  38 units BID Methimazole  5 mg BID     Statin: yes ACE-I/ARB: yes   CONTINUOUS GLUCOSE MONITORING RECORD INTERPRETATION    Dates of Recording: 10/28-11/04/2024  Sensor description: Dexcom  Results statistics:   CGM use % of time 97  Average and SD 128/54  Time in range 70 %  % Time Above 180 14  % Time above 250 2  % Time Below target 9   Glycemic patterns summary: BGs trend down overnight and fluctuate during the day  Hyperglycemic episodes postprandial  Hypoglycemic episodes occurred overnight  Overnight periods: Hypoglycemia noted   DIABETIC COMPLICATIONS: Microvascular complications:  Neuropathy  Denies: CKD, retinopathy  Last eye exam: Completed 01/2023  Macrovascular complications:   Denies: CAD, PVD, CVA   PAST HISTORY: Past Medical History:  Past Medical History:  Diagnosis Date   Anemia 12/09/2011   child birth   Anxiety    Appendicitis, acute 12/03/2011   Arthritis    Chronic back pain 12/03/2011   Degenerative disc disease, cervical    low back area   Diabetes mellitus type 2, insulin  dependent (HCC) 12/03/2011   GERD (gastroesophageal reflux disease)    H/O hiatal hernia    HLD (hyperlipidemia)  Hypertension 12/03/2011   Hyperthyroidism    IBS (irritable bowel syndrome)    Morbid obesity with BMI of 45.0-49.9, adult (HCC) 12/03/2011   Sleep apnea 12/03/2011   last sleep study May 2013   Past Surgical History:  Past Surgical History:  Procedure Laterality Date   BIOPSY  07/29/2022   Procedure: BIOPSY;  Surgeon: Wilhelmenia Aloha Raddle., MD;  Location: [facility];  Service: Gastroenterology;;   CESAREAN SECTION     x 2   COLONOSCOPY WITH PROPOFOL  N/A 07/29/2022   Procedure: COLONOSCOPY WITH PROPOFOL ;  Surgeon: Wilhelmenia Aloha Raddle., MD;  Location: [facility];   Service: Gastroenterology;  Laterality: N/A;   ESOPHAGOGASTRODUODENOSCOPY (EGD) WITH PROPOFOL  N/A 07/29/2022   Procedure: ESOPHAGOGASTRODUODENOSCOPY (EGD) WITH PROPOFOL ;  Surgeon: Wilhelmenia Aloha Raddle., MD;  Location: [facility];  Service: Gastroenterology;  Laterality: N/A;   ESOPHAGOGASTRODUODENOSCOPY (EGD) WITH PROPOFOL  N/A 09/05/2023   Procedure: ESOPHAGOGASTRODUODENOSCOPY (EGD) WITH PROPOFOL ;  Surgeon: Wilhelmenia Aloha Raddle., MD;  Location: [facility];  Service: Gastroenterology;  Laterality: N/A;   EUS N/A 07/29/2022   Procedure: UPPER ENDOSCOPIC ULTRASOUND (EUS) LINEAR;  Surgeon: Wilhelmenia Aloha Raddle., MD;  Location: [facility];  Service: Gastroenterology;  Laterality: N/A;   FINE NEEDLE ASPIRATION N/A 07/29/2022   Procedure: FINE NEEDLE ASPIRATION (FNA) LINEAR;  Surgeon: Wilhelmenia Aloha Raddle., MD;  Location: [facility];  Service: Gastroenterology;  Laterality: N/A;   FINE NEEDLE ASPIRATION N/A 09/05/2023   Procedure: FINE NEEDLE ASPIRATION (FNA) LINEAR;  Surgeon: Wilhelmenia Aloha Raddle., MD;  Location: [facility];  Service: Gastroenterology;  Laterality: N/A;   LAPAROSCOPIC APPENDECTOMY  12/03/2011   Procedure: APPENDECTOMY LAPAROSCOPIC;  Surgeon: Redell Faith, DO;  Location: [facility];  Service: General;  Laterality: N/A;  drainage of intra-abdominal abscess    PARS PLANA VITRECTOMY Left 08/17/2012   Procedure: PARS PLANA VITRECTOMY WITH 25 GAUGE;  Surgeon: Norleen JONETTA Ku, MD;  Location[facility];  Service: Ophthalmology;  Laterality: Left;   POLYPECTOMY  07/29/2022   Procedure: POLYPECTOMY;  Surgeon: Mansouraty, Aloha Raddle., MD;  Location: [facility];  Service: Gastroenterology;;   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 She has been exposed to tobacco smoke. She has never used smokeless tobacco. She reports that she does not currently use alcohol after a past usage of about 1.0 standard drink of alcohol per week. She reports that she does not use drugs. Family History:  Family History  Problem Relation Age of Onset   Breast cancer Mother 60   Diabetes Mother    Hypertension Mother    Kidney failure Father    Heart disease Father        No details   Diabetes Sister    Hypertension Sister    Hyperlipidemia Sister    Diabetes Maternal Uncle    Cancer Maternal Uncle        type unknown   Diabetes Paternal Aunt    Hypertension Paternal Aunt    Breast cancer Paternal Aunt    Fibromyalgia Paternal Aunt    Breast cancer Maternal Grandmother    Hypertension Maternal Grandmother  Kidney disease Maternal Grandfather    Cancer Maternal Grandfather        type unknown   Diabetes Paternal Grandmother    Diabetes Paternal Grandfather    Diabetes Daughter    Colon cancer Neg Hx    Esophageal cancer Neg Hx    Inflammatory bowel disease Neg Hx    Liver disease Neg Hx    Pancreatic cancer Neg Hx    Rectal cancer Neg Hx    Stomach cancer Neg Hx      HOME MEDICATIONS: Allergies as of 06/05/2024       Reactions   Latex Other (See Comments)   Infection in left middle finger from latex gloves due to moisture.   Augmentin  [amoxicillin -pot Clavulanate] Itching   Burning and itching, back pains, elevated sugar levels   Iohexol      Code: RASH, Desc: PATIENT CALLED 06/17/08-STATED AFTER 6 HOURS POST IV CONTRAST, HANDS,SCALP AND GROIN AREA DEVELOPED ITCHING,BURNING SENSATION, Onset Date: [id number]        Medication List        Accurate as of June 05, 2024 11:10 AM. If you  have any questions, ask your nurse or doctor.          Accu-Chek Guide Test test strip Generic drug: glucose blood Testing twice daily.   ascorbic acid  500 MG tablet Commonly known as: VITAMIN C  Take 1,000 mg by mouth daily.   baclofen  10 MG tablet Commonly known as: LIORESAL  Take 1 tablet (10 mg total) by mouth 3 (three) times daily.   Blood Pressure Monitor Devi Please provide patient with insurance approved blood pressure monitor   cyclobenzaprine  10 MG tablet Commonly known as: FLEXERIL  TAKE 1 TABLET (10 MG TOTAL) BY MOUTH 3 (THREE) TIMES DAILY AS NEEDED FOR MUSCLE SPASMS.   cyclobenzaprine  5 MG tablet Commonly known as: FLEXERIL  Take 1-2 tablets (5-10 mg total) by mouth 3 (three) times daily as needed for spasm/pain.   Dexcom G7 Sensor Misc Use to check blood sugar continuously throughout the day. E11.9   diclofenac  75 MG EC tablet Commonly known as: VOLTAREN  Take 1 tablet (75 mg total) by mouth 2 (two) times daily.   fenofibrate  145 MG tablet Commonly known as: TRICOR  Take 1 tablet (145 mg total) by mouth daily.   gabapentin  300 MG capsule Commonly known as: NEURONTIN  Take 1 capsule (300 mg total) by mouth at bedtime.   gabapentin  100 MG capsule Commonly known as: Neurontin  Take 1 capsule (100 mg) by mouth 2 (two) times daily with breakfast (at 8 AM) and lunch (at 2 PM) for 7 days.   gabapentin  300 MG capsule Commonly known as: Neurontin  Take 1-2 capsules (300-600 mg total) by mouth 3 (three) times daily.   glipiZIDE  10 MG tablet Commonly known as: GLUCOTROL  Take 2 tablets (20 mg total) by mouth 2 (two) times daily before a meal.   hydrochlorothiazide  25 MG tablet Commonly known as: HYDRODIURIL  Take 1 tablet (25 mg total) by mouth daily.   hyoscyamine  0.125 MG tablet Commonly known as: Levsin  Take 1 tablet (0.125 mg total) by mouth every 6 (six) hours as needed for cramping (diarrhea,nausea).   Lantus  SoloStar 100 UNIT/ML Solostar Pen Generic  drug: insulin  glargine Inject 36 Units into the skin 2 (two) times daily.   Linzess  290 MCG Caps capsule Generic drug: linaclotide  Take 1 capsule (290 mcg total) by mouth daily before breakfast.   lisinopril  40 MG tablet Commonly known as: ZESTRIL  Take 1 tablet (40 mg total) by mouth daily.  lubiprostone  24 MCG capsule Commonly known as: AMITIZA  Take 1 capsule (24 mcg total) by mouth 2 (two) times daily with a meal. Start with 1 daily for 2 days then increase to 2 daily   methimazole  5 MG tablet Commonly known as: TAPAZOLE  Take 1 tablet (5 mg total) by mouth 2 (two) times daily.   Multi-Vitamin tablet Take 1 tablet by mouth daily.   naproxen  500 MG tablet Commonly known as: NAPROSYN  Take 1 tablet (500 mg total) by mouth 2 (two) times daily with a meal. Take with food to avoid stomach upset. Do not take any additional NSAIDs while on this. You may take tylenol  in addition to this if needed for extra pain relief.   OneTouch Delica Plus Lancet33G Misc Testing twice daily   OneTouch Verio Flex System w/Device Kit Use up to 2 times daily as directed.   pantoprazole  40 MG tablet Commonly known as: PROTONIX  Take 1 tablet (40 mg total) by mouth 2 (two) times daily.   rosuvastatin  5 MG tablet Commonly known as: Crestor  Take 1 tablet (5 mg total) by mouth daily.   simethicone  80 MG chewable tablet Commonly known as: MYLICON Chew 1 tablet (80 mg total) by mouth every 6 (six) hours as needed for flatulence.   Trulicity  1.5 MG/0.5ML Soaj Generic drug: Dulaglutide  Inject 1.5 mg into the skin once a week.   Unifine Pentips 31G X 5 MM Misc Generic drug: Insulin  Pen Needle Inject as directed in the morning and at bedtime.   Ventolin  HFA 108 (90 Base) MCG/ACT inhaler Generic drug: albuterol  Inhale 2 puffs into the lungs every 6 (six) hours as needed for wheezing or shortness of breath. What changed: additional instructions   Vitamin D -3 125 MCG (5000 UT) Tabs Take 1 tablet by  mouth daily.         ALLERGIES: Allergies  Allergen Reactions   Latex Other (See Comments)    Infection in left middle finger from latex gloves due to moisture.   Augmentin  [Amoxicillin -Pot Clavulanate] Itching    Burning and itching, back pains, elevated sugar levels   Iohexol       Code: RASH, Desc: PATIENT CALLED 06/17/08-STATED AFTER 6 HOURS POST IV CONTRAST, HANDS,SCALP AND GROIN AREA DEVELOPED ITCHING,BURNING SENSATION, Onset Date: 87957990      REVIEW OF SYSTEMS: A comprehensive ROS was conducted with the patient and is negative except as per HPI     OBJECTIVE:   VITAL SIGNS: BP (!) 176/78   Pulse 81   Ht 5' 10 (1.778 m)   Wt 295 lb (133.8 kg)   LMP 08/09/2012   SpO2 98%   BMI 42.33 kg/m    PHYSICAL EXAM:  General: Pt appears well and is in NAD  Neck: General: Supple without adenopathy or carotid bruits. Thyroid : Right thyroid  asymmetry noted  Lungs: Clear with good BS bilat   Heart: RRR   Extremities:  Lower extremities - No pretibial edema.   Neuro: MS is good with appropriate affect, pt is alert and Ox3    DM foot exam: 06/05/2024   The skin of the feet is intact without sores or ulcerations. The pedal pulses are 2+ on right and 2+ on left. The sensation is decreased to a screening 5.07, 10 gram monofilament bilaterally at the toes    DATA REVIEWED:  Lab Results  Component Value Date   HGBA1C 8.1 (A) 06/05/2024   HGBA1C 7.7 (A) 12/14/2023   HGBA1C 9.1 (A) 08/23/2023    Latest Reference Range & Units  03/01/23 11:49  Sodium 134 - 144 mmol/L 137  Potassium 3.5 - 5.2 mmol/L 4.7  Chloride 96 - 106 mmol/L 98  CO2 20 - 29 mmol/L 23  Glucose 70 - 99 mg/dL 786 (H)  BUN 6 - 24 mg/dL 25 (H)  Creatinine 9.42 - 1.00 mg/dL 8.84 (H)  Calcium  8.7 - 10.2 mg/dL 9.4  BUN/Creatinine Ratio 9 - 23  22  eGFR >59 mL/min/1.73 56 (L)  Alkaline Phosphatase 44 - 121 IU/L 66  Albumin 3.8 - 4.9 g/dL 4.1  AST 0 - 40 IU/L 15  ALT 0 - 32 IU/L 10  Total Protein 6.0  - 8.5 g/dL 6.8  Total Bilirubin 0.0 - 1.2 mg/dL <9.7     Latest Reference Range & Units 04/14/23 15:51  Total CHOL/HDL Ratio 0.0 - 4.4 ratio 2.9  Cholesterol, Total 100 - 199 mg/dL 850  HDL Cholesterol >60 mg/dL 51  Triglycerides 0 - 850 mg/dL 870  VLDL Cholesterol Cal 5 - 40 mg/dL 23  LDL Chol Calc (NIH) 0 - 99 mg/dL 75    Latest Reference Range & Units 03/01/23 11:49  TSH 0.450 - 4.500 uIU/mL 1.560  Thyroxine (T4) 4.5 - 12.0 ug/dL 7.1  Free Thyroxine Index 1.2 - 4.9  1.8  T3 Uptake Ratio 24 - 39 % 25    Thyroid  Ultrasound 10/1/02024  Estimated total number of nodules >/= 1 cm: 5   Number of spongiform nodules >/=  2 cm not described below (TR1): 0   Number of mixed cystic and solid nodules >/= 1.5 cm not described below (TR2): 0   _________________________________________________________   Nodule labeled 1 in the mid right thyroid  appears unchanged, with the variance in measurement secondary to interpretation by the technologist completing the exam. By my measurement, the current 3 dimensions are unchanged when compared to study dated 10/04/2016 with the largest approximately 21 mm. This nodule underwent prior biopsy 10/20/2016. Assuming benign result no further specific follow-up would be indicated.   Nodule labeled 2 inferior right thyroid , 1.9 cm. This appears to be in a region of heterogeneous thyroid  tissue, with poorly defined borders and is favored to be a pseudo nodule.   Nodule labeled 3 in the mid right thyroid , 1.7 cm is spongiform and does not meet criteria for surveillance.   Nodule labeled 4 superior left thyroid , 2 point 5 cm. This nodule has spongiform characteristics and has undergone prior biopsy 10/20/2016. No further specific follow-up would be indicated.   Nodule labeled 5 in the mid left thyroid , 4.7 cm x 3.3 cm x 3.1 cm. Nodule is TR 4 and meets criteria for biopsy.   No adenopathy   IMPRESSION: Heterogeneous multinodular thyroid  goiter,  as above.   Left mid thyroid  nodule (labeled 5, TR 4, 4.7 cm) meets criteria for biopsy, as designated by the newly established ACR TI-RADS criteria, and referral for biopsy is recommended.    FNA left mid nodule 09/23/2023   Clinical History: Nodule labeled 5 in the mid left thyroid , 4.7 cm x 3.3  cm x 3.1 cm. Nodule is TR 4 and meets criteria for biopsy  Specimen Submitted:  A. THYROID , LEFT MID, FINE NEEDLE ASPIRATION:    FINAL MICROSCOPIC DIAGNOSIS:  - Benign follicular nodule (Bethesda category II)   ASSESSMENT / PLAN / RECOMMENDATIONS:   1) Type 2 Diabetes Mellitus, Poorly controlled, With Neuropathic  complications - Most recent A1c of 8.1 %. Goal A1c < 7.0 %.      -Patient continues with worsening glycemic control -  In reviewing CGM, patient has been noted with hypoglycemia overnight and severe hypoglycemia after supper, we discussed the importance of limiting CHO intake at supper.  The patient was wondering if she could increase glipizide  to 3 tablets at a time, I did explain to the patient that 40 mg is the maximum dose of glipizide  per day and she is already at the limit. -I did offer to increase Trulicity , but she would like to remain on the current dose -I will decrease insulin  as below due to hypoglycemia overnight -Intolerant to Metformin   - Intolerant to [employer]  - Intolerant to Ozempic     MEDICATIONS:  Trulicity  1.5 mg weekly Take glipizide  10 mg, 2 tabs before breakfast and 2 tabs before supper Decrease Lantus  30 units twice daily  EDUCATION / INSTRUCTIONS: BG monitoring instructions: Patient is instructed to check her blood sugars 3 times a day, before each meal . Call [facility] if: BG persistently < 70  I reviewed the Rule of 15 for the treatment of hypoglycemia in detail with the patient. Literature supplied.   2) Diabetic complications:  Eye: Does not have known diabetic retinopathy.  Neuro/ Feet: Does have known diabetic  peripheral neuropathy. Renal: Patient does not have known baseline CKD. She is  on an ACEI/ARB at present.  3) Hyperthyroidism  -Patient is clinically euthyroid - TFTs today****  Medication Continue methimazole  5 mg twice daily  4) MNG:   -No local neck symptoms -She is s/p FNA of multiple nodules in the past - She is s/p benign FNA of the left mid nodule 09/2023 - Will repeat thyroid  ultrasound next visit      Follow-up in 4 months    Signed electronically by: Stefano Redgie Butts, MD  [facility]  [facility] [address] [place] [place], [place] [address] Phone: [phone] FAX: [phone]   CC: Theotis Haze ORN, NP [address] [place] [address] [place] [address] Phone: [phone]  Fax: [phone]    Return to Endocrinology clinic as below: Future Appointments  Date Time Provider Department Center  06/26/2024  3:50 PM Theotis Haze ORN, NP [facility] [address]  07/10/2024  3:00 PM Zehr, Jessica D, PA-C [facility]  08/15/2024  3:45 PM Gaynel Delon CROME, DPM [facility]

## 2024-06-05 NOTE — Patient Instructions (Signed)
 Take glipizide  10 mg, 2 tablets before breakfast and 2 tablets before supper Continue Trulicity  1.5 mg once weekly Decrease Lantus  30 units twice daily   HOW TO TREAT LOW BLOOD SUGARS (Blood sugar LESS THAN 70 MG/DL) Please follow the RULE OF 15 for the treatment of hypoglycemia treatment (when your (blood sugars are less than 70 mg/dL)   STEP 1: Take 15 grams of carbohydrates when your blood sugar is low, which includes:  3-4 GLUCOSE TABS  OR 3-4 OZ OF JUICE OR REGULAR SODA OR ONE TUBE OF GLUCOSE GEL    STEP 2: RECHECK blood sugar in 15 MINUTES STEP 3: If your blood sugar is still low at the 15 minute recheck --> then, go back to STEP 1 and treat AGAIN with another 15 grams of carbohydrates.

## 2024-06-06 ENCOUNTER — Ambulatory Visit: Payer: Self-pay | Admitting: Internal Medicine

## 2024-06-06 ENCOUNTER — Other Ambulatory Visit: Payer: Self-pay

## 2024-06-08 ENCOUNTER — Encounter: Payer: Self-pay | Admitting: Hematology

## 2024-06-08 ENCOUNTER — Other Ambulatory Visit: Payer: Self-pay

## 2024-06-11 DIAGNOSIS — M5416 Radiculopathy, lumbar region: Secondary | ICD-10-CM | POA: Diagnosis not present

## 2024-06-11 DIAGNOSIS — M1712 Unilateral primary osteoarthritis, left knee: Secondary | ICD-10-CM | POA: Diagnosis not present

## 2024-06-12 ENCOUNTER — Other Ambulatory Visit: Payer: Self-pay

## 2024-06-13 ENCOUNTER — Ambulatory Visit: Admitting: Nurse Practitioner

## 2024-06-18 ENCOUNTER — Other Ambulatory Visit: Payer: Self-pay

## 2024-06-18 NOTE — Telephone Encounter (Signed)
 Pharmacy Patient Advocate Encounter  Received notification from [employer] that Prior Authorization for Dexcom G7 sensor has been APPROVED from 07/13/2023 to 07/11/2025   PA #/Case ID/Reference #: [id number]

## 2024-06-19 ENCOUNTER — Other Ambulatory Visit: Payer: Self-pay

## 2024-06-26 ENCOUNTER — Other Ambulatory Visit: Payer: Self-pay

## 2024-06-26 ENCOUNTER — Ambulatory Visit: Attending: Nurse Practitioner | Admitting: Nurse Practitioner

## 2024-06-26 ENCOUNTER — Encounter: Payer: Self-pay | Admitting: Nurse Practitioner

## 2024-06-26 VITALS — BP 122/66 | HR 61 | Ht 70.0 in | Wt 311.6 lb

## 2024-06-26 DIAGNOSIS — R350 Frequency of micturition: Secondary | ICD-10-CM

## 2024-06-26 DIAGNOSIS — D649 Anemia, unspecified: Secondary | ICD-10-CM

## 2024-06-26 DIAGNOSIS — F321 Major depressive disorder, single episode, moderate: Secondary | ICD-10-CM | POA: Insufficient documentation

## 2024-06-26 DIAGNOSIS — G8929 Other chronic pain: Secondary | ICD-10-CM

## 2024-06-26 DIAGNOSIS — M2550 Pain in unspecified joint: Secondary | ICD-10-CM

## 2024-06-26 DIAGNOSIS — I10 Essential (primary) hypertension: Secondary | ICD-10-CM

## 2024-06-26 LAB — POCT URINALYSIS DIP (CLINITEK)
Bilirubin, UA: NEGATIVE
Blood, UA: NEGATIVE
Glucose, UA: NEGATIVE mg/dL
Ketones, POC UA: NEGATIVE mg/dL
Leukocytes, UA: NEGATIVE
Nitrite, UA: NEGATIVE
POC PROTEIN,UA: NEGATIVE
Spec Grav, UA: 1.01 (ref 1.010–1.025)
Urobilinogen, UA: 0.2 U/dL
pH, UA: 5.5 (ref 5.0–8.0)

## 2024-06-26 MED ORDER — CYCLOBENZAPRINE HCL 10 MG PO TABS
10.0000 mg | ORAL_TABLET | Freq: Three times a day (TID) | ORAL | 1 refills | Status: AC | PRN
  Filled 2024-06-26 – 2024-07-18 (×2): qty 90, 30d supply, fill #0

## 2024-06-26 NOTE — Progress Notes (Signed)
 Assessment & Plan:  Kristina Adams was seen today for hypertension and urinary frequency.  Diagnoses and all orders for this visit:  Primary hypertension Continue all antihypertensives as prescribed.  Reminded to bring in blood pressure log for follow  up appointment.  RECOMMENDATIONS: DASH/Mediterranean Diets are healthier choices for HTN.    Current moderate episode of major depressive disorder without prior episode (HCC) Major depressive disorder, single episode, moderate. Acknowledged need for mental health support, plans to contact psychiatrist from previous referral - Encouraged follow-up with psychiatrist for mental health support. - Provided reassurance and support for emotional well-being.   Arthralgia of multiple joints -     Rheumatoid Arthritis Profile Knee osteoarthritis with effusion confirmed by x-ray. Reports swelling and pain. Orthopedist recommended further evaluation. - Referred to orthopedist for knee evaluation and management. - Ordered arthritis panel to rule out autoimmune arthritis.   Urinary frequency -     POCT URINALYSIS DIP (CLINITEK)  Chronic bilateral low back pain without sciatica -     cyclobenzaprine  (FLEXERIL ) 10 MG tablet; Take 1 tablet (10 mg total) by mouth 3 (three) times daily as needed for muscle spasms. 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 Gabapentin  effective for pain, reduced dosage due to kidney concerns. Cyclobenzaprine  used for muscle relaxation. - Continue gabapentin  as needed for pain management. - Continue cyclobenzaprine  10 mg up to three times a day for muscle relaxation. - Follow up with orthopedist for ongoing management of back pain.  Anemia, unspecified type -     CBC with Differential Reports urinary frequency with dysuria, negative urine test for infection.    Patient has been counseled on age-appropriate routine health concerns for screening and prevention. These are reviewed and up-to-date. Referrals  have been placed accordingly. Immunizations are up-to-date or declined.    Subjective:   Chief Complaint  Patient presents with   Hypertension   Urinary Frequency    Burning while urinating and lower back pain.     Kristina Adams 58 y.o. female presents to office today for follow up to HTN and with urinary symptoms of UTI  She has a past medical history of Anemia (12/09/2011), Anxiety, Appendicitis, acute (12/03/2011), Arthritis, Chronic back pain (12/03/2011), Degenerative disc disease, cervical, DM2 (12/03/2011), GERD, H/O hiatal hernia, HLD, Hypertension (12/03/2011), Hyperthyroidism, IBS, Morbid obesity with BMI of 45.0-49.9, adult (HCC) (12/03/2011), and Sleep apnea (12/03/2011).    She is followed by Endocrinology for her DM and thyroid  disease   States she has been Feeling yucky.  She has spinal stenosis and sciatica with ongoing joint pain and mobility issues including her knees. She is being followed by Ortho at this time but believes she needs to see a joint doctor (rheumatologist) She has been experiencing significant joint pain and mobility issues since the third week of September. Sciatica and spinal stenosis have been causing considerable discomfort, necessitating the use of a cane for mobility due to balance issues. A cortisone shot for her back provided relief after about two weeks, but she continues to experience pain. She uses gabapentin , 300-600 mg three times a day, and cyclobenzaprine , 10 mg up to three times a day, for pain management.  s had to stop water aerobics due to pain and mobility issues, impacting her physical activity and overall well-being.  She experiences knee pain, believed to be exacerbated by her back issues. An x-ray showed significant arthritis and a small amount of fluid in her knee. Swelling and pain are especially noted when sitting, and  she feels her weight has increased due to inflammation.  She reports urinary symptoms, including increased  frequency and a burning sensation when urinating, which have developed over the past two weeks.   HTN Blood pressure at goal.  BP Readings from Last 3 Encounters:  06/26/24 122/66  06/05/24 (!) 150/62  04/08/24 (!) 168/75     Review of Systems  Constitutional:  Negative for fever, malaise/fatigue and weight loss.  HENT: Negative.  Negative for nosebleeds.   Eyes: Negative.  Negative for blurred vision, double vision and photophobia.  Respiratory: Negative.  Negative for cough and shortness of breath.   Cardiovascular: Negative.  Negative for chest pain, palpitations and leg swelling.  Gastrointestinal: Negative.  Negative for heartburn, nausea and vomiting.  Genitourinary:  Positive for frequency.  Musculoskeletal:  Positive for back pain. Negative for myalgias.  Neurological:  Positive for weakness. Negative for dizziness, focal weakness, seizures and headaches.  Psychiatric/Behavioral: Negative.  Negative for suicidal ideas.     Past Medical History:  Diagnosis Date   Anemia 12/09/2011   child birth   Anxiety    Appendicitis, acute 12/03/2011   Arthritis    Chronic back pain 12/03/2011   Degenerative disc disease, cervical    low back area   Diabetes mellitus type 2, insulin  dependent (HCC) 12/03/2011   GERD (gastroesophageal reflux disease)    H/O hiatal hernia    HLD (hyperlipidemia)    Hypertension 12/03/2011   Hyperthyroidism    IBS (irritable bowel syndrome)    Morbid obesity with BMI of 45.0-49.9, adult (HCC) 12/03/2011   Sleep apnea 12/03/2011   last sleep study May 2013    Past Surgical History:  Procedure Laterality Date   BIOPSY  07/29/2022   Procedure: BIOPSY;  Surgeon: Wilhelmenia Aloha Raddle., MD;  Location: [facility];  Service: Gastroenterology;;   CESAREAN SECTION     x 2   COLONOSCOPY WITH PROPOFOL  N/A 07/29/2022   Procedure: COLONOSCOPY WITH PROPOFOL ;  Surgeon: Wilhelmenia Aloha Raddle., MD;  Location: [facility];  Service: Gastroenterology;   Laterality: N/A;   ESOPHAGOGASTRODUODENOSCOPY (EGD) WITH PROPOFOL  N/A 07/29/2022   Procedure: ESOPHAGOGASTRODUODENOSCOPY (EGD) WITH PROPOFOL ;  Surgeon: Wilhelmenia Aloha Raddle., MD;  Location: [facility];  Service: Gastroenterology;  Laterality: N/A;   ESOPHAGOGASTRODUODENOSCOPY (EGD) WITH PROPOFOL  N/A 09/05/2023   Procedure: ESOPHAGOGASTRODUODENOSCOPY (EGD) WITH PROPOFOL ;  Surgeon: Wilhelmenia Aloha Raddle., MD;  Location: [facility];  Service: Gastroenterology;  Laterality: N/A;   EUS N/A 07/29/2022   Procedure: UPPER ENDOSCOPIC ULTRASOUND (EUS) LINEAR;  Surgeon: Wilhelmenia Aloha Raddle., MD;  Location: [facility];  Service: Gastroenterology;  Laterality: N/A;   FINE NEEDLE ASPIRATION N/A 07/29/2022   Procedure: FINE NEEDLE ASPIRATION (FNA) LINEAR;  Surgeon: Wilhelmenia Aloha Raddle., MD;  Location: [facility];  Service: Gastroenterology;  Laterality: N/A;   FINE NEEDLE ASPIRATION N/A 09/05/2023   Procedure: FINE NEEDLE ASPIRATION (FNA) LINEAR;  Surgeon: Wilhelmenia Aloha Raddle., MD;  Location: [facility];  Service: Gastroenterology;  Laterality: N/A;   LAPAROSCOPIC APPENDECTOMY  12/03/2011   Procedure: APPENDECTOMY LAPAROSCOPIC;  Surgeon: Redell Faith, DO;  Location: [facility];  Service: General;  Laterality: N/A;  drainage of intra-abdominal abscess    PARS PLANA VITRECTOMY Left 08/17/2012   Procedure: PARS PLANA VITRECTOMY WITH 25 GAUGE;  Surgeon: Norleen JONETTA Ku, MD;  Location: [facility];  Service: Ophthalmology;  Laterality: Left;   POLYPECTOMY  07/29/2022   Procedure: POLYPECTOMY;  Surgeon: Mansouraty, Aloha Raddle., MD;  Location: [facility];  Service: Gastroenterology;;   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   Procedure: REPAIR OF COMPLEX TRACTION RETINAL DETACHMENT;  Surgeon: Norleen JONETTA Ku, MD;  Location: [facility];  Service: Ophthalmology;  Laterality: Left;   TUBAL LIGATION     UPPER ESOPHAGEAL ENDOSCOPIC ULTRASOUND (EUS) N/A 09/05/2023   Procedure: UPPER ESOPHAGEAL  ENDOSCOPIC ULTRASOUND (EUS);  Surgeon: Wilhelmenia Aloha Raddle., MD;  Location: [facility];  Service: Gastroenterology;  Laterality: N/A;    Family History  Problem Relation Age of Onset   Breast cancer Mother 64   Diabetes Mother    Hypertension Mother    Kidney failure Father    Heart disease Father        No details   Diabetes Sister    Hypertension Sister    Hyperlipidemia Sister    Diabetes Maternal Uncle    Cancer Maternal Uncle        type unknown   Diabetes Paternal Aunt    Hypertension Paternal Aunt    Breast cancer Paternal Aunt    Fibromyalgia Paternal Aunt    Breast cancer Maternal Grandmother    Hypertension Maternal Grandmother    Kidney disease Maternal Grandfather    Cancer Maternal Grandfather        type unknown   Diabetes Paternal Grandmother    Diabetes Paternal Grandfather    Diabetes Daughter    Colon cancer Neg Hx    Esophageal cancer Neg Hx    Inflammatory bowel disease Neg Hx    Liver disease Neg Hx    Pancreatic cancer Neg Hx    Rectal cancer Neg Hx    Stomach cancer Neg Hx     Social History Reviewed with no changes to be made today.   Outpatient Medications Prior to Visit  Medication Sig Dispense Refill   albuterol  (VENTOLIN  HFA) 108 (90 Base) MCG/ACT inhaler Inhale 2 puffs into the lungs every 6 (six) hours as needed for wheezing or shortness of breath. (Patient taking differently: Inhale 2 puffs into the lungs every 6 (six) hours as needed for wheezing or shortness of breath. During allergy season) 18 g 0   blood glucose meter kit and supplies KIT Use up to 2 times daily as directed. 1 each 0   Blood Pressure Monitor DEVI Please provide patient with insurance approved blood pressure monitor 1 each 0   Cholecalciferol  (VITAMIN D -3) 125 MCG (5000 UT) TABS Take 1 tablet by mouth daily. 90 tablet 3   Continuous Glucose Sensor (DEXCOM G7 SENSOR) MISC Use to check blood sugar continuously throughout the day. E11.9 9 each 2   diclofenac   (VOLTAREN ) 75 MG EC tablet Take 1 tablet (75 mg total) by mouth 2 (two) times daily. 30 tablet 3   Dulaglutide  (TRULICITY ) 1.5 MG/0.5ML SOAJ Inject 1.5 mg into the skin once a week. 6 mL 3   fenofibrate  (TRICOR ) 145 MG tablet Take 1 tablet (145 mg total) by mouth daily. 90 tablet 1   gabapentin  (NEURONTIN ) 300 MG capsule Take 1-2 capsules (300-600 mg total) by mouth 3 (three) times daily. 90 capsule 3   glipiZIDE  (GLUCOTROL ) 10 MG tablet Take 2 tablets (20 mg total) by mouth 2 (two) times daily before a meal. 360 tablet 2   glucose blood (ONETOUCH VERIO) test strip Testing twice daily. 200 each 0   hydrochlorothiazide  (HYDRODIURIL ) 25 MG tablet Take 1 tablet (25 mg total) by mouth daily. 30 tablet 0   hyoscyamine  (LEVSIN ) 0.125 MG tablet Take 1 tablet (0.125 mg total) by mouth every 6 (six) hours as needed for cramping (diarrhea,nausea).  60 tablet 1   insulin  glargine (LANTUS  SOLOSTAR) 100 UNIT/ML Solostar Pen Inject 30 Units into the skin 2 (two) times daily. 60 mL 4   Insulin  Pen Needle (B-D UF III MINI PEN NEEDLES) 31G X 5 MM MISC Inject as directed in the morning and at bedtime. 200 each 3   Lancets (ONETOUCH DELICA PLUS LANCET33G) MISC Testing twice daily 200 each 1   linaclotide  (LINZESS ) 290 MCG CAPS capsule Take 1 capsule (290 mcg total) by mouth daily before breakfast. 90 capsule 2   lisinopril  (ZESTRIL ) 40 MG tablet Take 1 tablet (40 mg total) by mouth daily. 30 tablet 0   lubiprostone  (AMITIZA ) 24 MCG capsule Take 1 capsule (24 mcg total) by mouth 2 (two) times daily with a meal. Start with 1 daily for 2 days then increase to 2 daily 28 capsule 0   methimazole  (TAPAZOLE ) 5 MG tablet Take 1 tablet (5 mg total) by mouth 2 (two) times daily. 180 tablet 3   Multiple Vitamin (MULTI-VITAMIN) tablet Take 1 tablet by mouth daily.     naproxen  (NAPROSYN ) 500 MG tablet Take 1 tablet (500 mg total) by mouth 2 (two) times daily with a meal. Take with food to avoid stomach upset. Do not take any  additional NSAIDs while on this. You may take tylenol  in addition to this if needed for extra pain relief. 20 tablet 0   pantoprazole  (PROTONIX ) 40 MG tablet Take 1 tablet (40 mg total) by mouth 2 (two) times daily. 180 tablet 0   rosuvastatin  (CRESTOR ) 5 MG tablet Take 1 tablet (5 mg total) by mouth daily. 30 tablet 0   simethicone  (MYLICON) 80 MG chewable tablet Chew 1 tablet (80 mg total) by mouth every 6 (six) hours as needed for flatulence. 30 tablet 1   vitamin C  (ASCORBIC ACID ) 500 MG tablet Take 1,000 mg by mouth daily.     baclofen  (LIORESAL ) 10 MG tablet Take 1 tablet (10 mg total) by mouth 3 (three) times daily. 30 each 0   cyclobenzaprine  (FLEXERIL ) 10 MG tablet TAKE 1 TABLET (10 MG TOTAL) BY MOUTH 3 (THREE) TIMES DAILY AS NEEDED FOR MUSCLE SPASMS. 90 tablet 2   gabapentin  (NEURONTIN ) 300 MG capsule Take 1 capsule (300 mg total) by mouth at bedtime. 270 capsule 2   cyclobenzaprine  (FLEXERIL ) 5 MG tablet Take 1-2 tablets (5-10 mg total) by mouth 3 (three) times daily as needed for spasm/pain. (Patient not taking: Reported on 06/26/2024) 60 tablet 2   gabapentin  (NEURONTIN ) 100 MG capsule Take 1 capsule (100 mg) by mouth 2 (two) times daily with breakfast (at 8 AM) and lunch (at 2 PM) for 7 days. (Patient not taking: Reported on 06/26/2024) 14 capsule 0   No facility-administered medications prior to visit.    Allergies[1]     Objective:    BP 122/66 (BP Location: Left Arm, Patient Position: Sitting, Cuff Size: Normal)   Pulse 61   Ht 5' 10 (1.778 m)   Wt (!) 311 lb 9.6 oz (141.3 kg)   LMP 08/09/2012   SpO2 97%   BMI 44.71 kg/m  Wt Readings from Last 3 Encounters:  06/26/24 (!) 311 lb 9.6 oz (141.3 kg)  06/05/24 295 lb (133.8 kg)  02/17/24 (!) 305 lb 9.6 oz (138.6 kg)    Physical Exam Vitals and nursing note reviewed.  Constitutional:      Appearance: She is well-developed. She is obese.  HENT:     Head: Normocephalic and atraumatic.  Cardiovascular:  Rate and  Rhythm: Normal rate and regular rhythm.     Heart sounds: Normal heart sounds. No murmur heard.    No friction rub. No gallop.  Pulmonary:     Effort: Pulmonary effort is normal. No tachypnea or respiratory distress.     Breath sounds: Normal breath sounds. No decreased breath sounds, wheezing, rhonchi or rales.  Chest:     Chest wall: No tenderness.  Abdominal:     General: Bowel sounds are normal.     Palpations: Abdomen is soft.  Musculoskeletal:        General: Normal range of motion.     Cervical back: Normal range of motion.  Skin:    General: Skin is warm and dry.  Neurological:     Mental Status: She is alert and oriented to person, place, and time.     Coordination: Coordination normal.  Psychiatric:        Mood and Affect: Affect is tearful.        Behavior: Behavior normal. Behavior is cooperative.        Thought Content: Thought content normal.        Judgment: Judgment normal.          Patient has been counseled extensively about nutrition and exercise as well as the importance of adherence with medications and regular follow-up. The patient was given clear instructions to go to ER or return to medical center if symptoms don't improve, worsen or new problems develop. The patient verbalized understanding.   Follow-up: Return in about 3 months (around 10/05/2024).   Haze LELON Servant, FNP-BC [facility] [place], [place] [phone]   06/26/2024, 10:23 PM     [1]  Allergies Allergen Reactions   Latex Other (See Comments)    Infection in left middle finger from latex gloves due to moisture.   Augmentin  [Amoxicillin -Pot Clavulanate] Itching    Burning and itching, back pains, elevated sugar levels   Iohexol       Code: RASH, Desc: PATIENT CALLED 06/17/08-STATED AFTER 6 HOURS POST IV CONTRAST, HANDS,SCALP AND GROIN AREA DEVELOPED ITCHING,BURNING SENSATION, Onset Date: [id number]

## 2024-06-28 LAB — CBC WITH DIFFERENTIAL/PLATELET
Basophils Absolute: 0.1 x10E3/uL (ref 0.0–0.2)
Basos: 1 %
EOS (ABSOLUTE): 0.1 x10E3/uL (ref 0.0–0.4)
Eos: 2 %
Hematocrit: 33.2 % — ABNORMAL LOW (ref 34.0–46.6)
Hemoglobin: 10.4 g/dL — ABNORMAL LOW (ref 11.1–15.9)
Immature Grans (Abs): 0.1 x10E3/uL (ref 0.0–0.1)
Immature Granulocytes: 1 %
Lymphocytes Absolute: 1.9 x10E3/uL (ref 0.7–3.1)
Lymphs: 22 %
MCH: 27.9 pg (ref 26.6–33.0)
MCHC: 31.3 g/dL — ABNORMAL LOW (ref 31.5–35.7)
MCV: 89 fL (ref 79–97)
Monocytes Absolute: 0.4 x10E3/uL (ref 0.1–0.9)
Monocytes: 5 %
Neutrophils Absolute: 5.9 x10E3/uL (ref 1.4–7.0)
Neutrophils: 69 %
Platelets: 335 x10E3/uL (ref 150–450)
RBC: 3.73 x10E6/uL — ABNORMAL LOW (ref 3.77–5.28)
RDW: 13.8 % (ref 11.7–15.4)
WBC: 8.4 x10E3/uL (ref 3.4–10.8)

## 2024-06-28 LAB — RHEUMATOID ARTHRITIS PROFILE
Cyclic Citrullin Peptide Ab: 8 U (ref 0–19)
Rheumatoid fact SerPl-aCnc: <10 [IU]/mL (ref <14.0)

## 2024-06-30 ENCOUNTER — Ambulatory Visit: Payer: Self-pay | Admitting: Nurse Practitioner

## 2024-07-02 ENCOUNTER — Other Ambulatory Visit: Payer: Self-pay

## 2024-07-06 ENCOUNTER — Other Ambulatory Visit: Payer: Self-pay

## 2024-07-09 ENCOUNTER — Other Ambulatory Visit: Payer: Self-pay

## 2024-07-10 ENCOUNTER — Ambulatory Visit: Admitting: Gastroenterology

## 2024-07-10 ENCOUNTER — Encounter: Payer: Self-pay | Admitting: Gastroenterology

## 2024-07-10 VITALS — BP 150/80 | HR 72 | Ht 70.0 in | Wt 307.0 lb

## 2024-07-10 DIAGNOSIS — K5904 Chronic idiopathic constipation: Secondary | ICD-10-CM | POA: Diagnosis not present

## 2024-07-10 DIAGNOSIS — K5909 Other constipation: Secondary | ICD-10-CM

## 2024-07-10 DIAGNOSIS — K219 Gastro-esophageal reflux disease without esophagitis: Secondary | ICD-10-CM | POA: Diagnosis not present

## 2024-07-10 MED ORDER — FAMOTIDINE 20 MG PO TABS: 20.0000 mg | ORAL_TABLET | Freq: Every day | ORAL | Status: AC

## 2024-07-10 MED ORDER — TRULANCE 3 MG PO TABS: 1.0000 | ORAL_TABLET | Freq: Every day | ORAL | Status: AC

## 2024-07-10 NOTE — Progress Notes (Signed)
 "    07/10/2024 KIMM SIDER [id number] 1966-02-25   Discussed the use of AI scribe software for clinical note transcription with the patient, who gave verbal consent to proceed.  History of Present Illness NYKERRIA MACCONNELL is a 58 year old female with chronic idiopathic constipation who presents for follow-up of bowel management and medication access.  Constipation has been longstanding, associated with bloating and cramping. Multiple medications have been trialed, including Linzess  290 mcg daily, Trulance, Ibsrela, and Amitiza . Linzess  290 mcg daily has provided the most effective symptom control, resulting in loose bowel movements without straining, improved regularity, and relief from bloating and abdominal pressure. Linzess  is taken daily, typically in the morning before the first meal, or in the afternoon if waking late, with a bowel movement occurring within 1-2 hours of dosing. Loose stools are preferred over constipation, as she states she would rather have loose stools than not going and being miserable. Trulance and Amitiza  tried for short periods of time but not exactly sure of her response to them.  Linzess  has been used for quite a while now, but she is now unable to afford it due to change in insurance formulary. She is on a fixed income with Medicare and has been quoted $381 for a 90-day supply and $297 for a 30-day supply, which is unaffordable. Attempts to find alternatives through insurance and pharmacy have not yielded comparable covered options. Approximately one to two weeks of Linzess  remain.   She expresses concern about possible recurrence of Helicobacter pylori infection due to mild bloating in the past couple of weeks, possibly related to holiday dietary changes. H. pylori was diagnosed in 2024 and treated, with a negative confirmatory stool test in April 2024.  Acid reflux is managed with daily pantoprazole , but increased nighttime reflux symptoms have occurred recently,  particularly over the holidays and after a period of decreased mobility due to sciatica and spinal stenosis from late September to early December 2025. During this period, she was largely bedridden and required assistance for daily activities, sometimes missing medication doses and eating irregularly, which she believes contributed to increased reflux. Reflux is described as kicking up at night despite daily pantoprazole  use.  History of sciatica and degenerative disc disease with a recent severe episode affecting the left lower back, hip, and leg, resulting in significant pain and immobility for several weeks. She has recently regained the ability to walk without assistance, though some residual soreness persists.   Past Medical History:  Diagnosis Date   Anemia 12/09/2011   child birth   Anxiety    Appendicitis, acute 12/03/2011   Arthritis    Chronic back pain 12/03/2011   Degenerative disc disease, cervical    low back area   Diabetes mellitus type 2, insulin  dependent (HCC) 12/03/2011   GERD (gastroesophageal reflux disease)    H/O hiatal hernia    HLD (hyperlipidemia)    Hypertension 12/03/2011   Hyperthyroidism    IBS (irritable bowel syndrome)    Morbid obesity with BMI of 45.0-49.9, adult (HCC) 12/03/2011   Sleep apnea 12/03/2011   last sleep study May 2013   Past Surgical History:  Procedure Laterality Date   BIOPSY  07/29/2022   Procedure: BIOPSY;  Surgeon: Wilhelmenia Aloha Raddle., MD;  Location: [facility];  Service: Gastroenterology;;   CESAREAN SECTION     x 2   COLONOSCOPY WITH PROPOFOL  N/A 07/29/2022   Procedure: COLONOSCOPY WITH PROPOFOL ;  Surgeon: Wilhelmenia Aloha Raddle., MD;  Location: [facility];  Service:  Gastroenterology;  Laterality: N/A;   ESOPHAGOGASTRODUODENOSCOPY (EGD) WITH PROPOFOL  N/A 07/29/2022   Procedure: ESOPHAGOGASTRODUODENOSCOPY (EGD) WITH PROPOFOL ;  Surgeon: Wilhelmenia Aloha Raddle., MD;  Location: [facility];  Service: Gastroenterology;   Laterality: N/A;   ESOPHAGOGASTRODUODENOSCOPY (EGD) WITH PROPOFOL  N/A 09/05/2023   Procedure: ESOPHAGOGASTRODUODENOSCOPY (EGD) WITH PROPOFOL ;  Surgeon: Wilhelmenia Aloha Raddle., MD;  Location: [facility];  Service: Gastroenterology;  Laterality: N/A;   EUS N/A 07/29/2022   Procedure: UPPER ENDOSCOPIC ULTRASOUND (EUS) LINEAR;  Surgeon: Wilhelmenia Aloha Raddle., MD;  Location: [facility];  Service: Gastroenterology;  Laterality: N/A;   FINE NEEDLE ASPIRATION N/A 07/29/2022   Procedure: FINE NEEDLE ASPIRATION (FNA) LINEAR;  Surgeon: Wilhelmenia Aloha Raddle., MD;  Location: [facility];  Service: Gastroenterology;  Laterality: N/A;   FINE NEEDLE ASPIRATION N/A 09/05/2023   Procedure: FINE NEEDLE ASPIRATION (FNA) LINEAR;  Surgeon: Wilhelmenia Aloha Raddle., MD;  Location: [facility];  Service: Gastroenterology;  Laterality: N/A;   LAPAROSCOPIC APPENDECTOMY  12/03/2011   Procedure: APPENDECTOMY LAPAROSCOPIC;  Surgeon: Redell Faith, DO;  Location: [facility];  Service: General;  Laterality: N/A;  drainage of intra-abdominal abscess    PARS PLANA VITRECTOMY Left 08/17/2012   Procedure: PARS PLANA VITRECTOMY WITH 25 GAUGE;  Surgeon: Norleen JONETTA Ku, MD;  Location: [facility];  Service: Ophthalmology;  Laterality: Left;   POLYPECTOMY  07/29/2022   Procedure: POLYPECTOMY;  Surgeon: Mansouraty, Aloha Raddle., MD;  Location: [facility];  Service: Gastroenterology;;   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 She has been exposed to tobacco smoke. She has never  used smokeless tobacco. She reports that she does not currently use alcohol after a past usage of about 1.0 standard drink of alcohol per week. She reports that she does not use drugs. family history includes Breast cancer in her maternal grandmother and paternal aunt; Breast cancer (age of onset: 17) in her mother; Cancer in her maternal grandfather and maternal uncle; Diabetes in her daughter, maternal uncle, mother, paternal aunt, paternal grandfather, paternal grandmother, and sister; Fibromyalgia in her paternal aunt; Heart disease in her father; Hyperlipidemia in her sister; Hypertension in her maternal grandmother, mother, paternal aunt, and sister; Kidney disease in her maternal grandfather; Kidney failure in her father. Allergies[1]    Outpatient Encounter Medications as of 07/10/2024  Medication Sig   albuterol  (VENTOLIN  HFA) 108 (90 Base) MCG/ACT inhaler Inhale 2 puffs into the lungs every 6 (six) hours as needed for wheezing or shortness of breath. (Patient taking differently: Inhale 2 puffs into the lungs every 6 (six) hours as needed for wheezing or shortness of breath. During allergy season)   blood glucose meter kit and supplies KIT Use up to 2 times daily as directed.   Blood Pressure Monitor DEVI Please provide patient with insurance approved blood pressure monitor   Cholecalciferol  (VITAMIN D -3) 125 MCG (5000 UT) TABS Take 1 tablet by mouth daily.   cyclobenzaprine  (FLEXERIL ) 10 MG tablet Take 1 tablet (10 mg total) by mouth 3 (three) times daily as needed for muscle spasms.   fenofibrate  (TRICOR ) 145 MG tablet Take 1 tablet (145 mg  total) by mouth daily.   gabapentin  (NEURONTIN ) 300 MG capsule Take 1-2 capsules (300-600 mg total) by mouth 3 (three) times daily.   glipiZIDE  (GLUCOTROL ) 10 MG tablet Take 2 tablets (20 mg total) by mouth 2 (two) times daily before a meal.   glucose blood (ONETOUCH VERIO) test strip Testing twice daily.   hydrochlorothiazide  (HYDRODIURIL ) 25 MG tablet  Take 1 tablet (25 mg total) by mouth daily.   hyoscyamine  (LEVSIN ) 0.125 MG tablet Take 1 tablet (0.125 mg total) by mouth every 6 (six) hours as needed for cramping (diarrhea,nausea).   insulin  glargine (LANTUS  SOLOSTAR) 100 UNIT/ML Solostar Pen Inject 30 Units into the skin 2 (two) times daily.   Insulin  Pen Needle (B-D UF III MINI PEN NEEDLES) 31G X 5 MM MISC Inject as directed in the morning and at bedtime.   Lancets (ONETOUCH DELICA PLUS LANCET33G) MISC Testing twice daily   lisinopril  (ZESTRIL ) 40 MG tablet Take 1 tablet (40 mg total) by mouth daily.   methimazole  (TAPAZOLE ) 5 MG tablet Take 1 tablet (5 mg total) by mouth 2 (two) times daily.   Multiple Vitamin (MULTI-VITAMIN) tablet Take 1 tablet by mouth daily.   naproxen  (NAPROSYN ) 500 MG tablet Take 1 tablet (500 mg total) by mouth 2 (two) times daily with a meal. Take with food to avoid stomach upset. Do not take any additional NSAIDs while on this. You may take tylenol  in addition to this if needed for extra pain relief.   pantoprazole  (PROTONIX ) 40 MG tablet Take 1 tablet (40 mg total) by mouth 2 (two) times daily.   rosuvastatin  (CRESTOR ) 5 MG tablet Take 1 tablet (5 mg total) by mouth daily.   simethicone  (MYLICON) 80 MG chewable tablet Chew 1 tablet (80 mg total) by mouth every 6 (six) hours as needed for flatulence.   vitamin C  (ASCORBIC ACID ) 500 MG tablet Take 1,000 mg by mouth daily.   [DISCONTINUED] Continuous Glucose Sensor (DEXCOM G7 SENSOR) MISC Use to check blood sugar continuously throughout the day. E11.9   [DISCONTINUED] diclofenac  (VOLTAREN ) 75 MG EC tablet Take 1 tablet (75 mg total) by mouth 2 (two) times daily.   [DISCONTINUED] Dulaglutide  (TRULICITY ) 1.5 MG/0.5ML SOAJ Inject 1.5 mg into the skin once a week.   [DISCONTINUED] linaclotide  (LINZESS ) 290 MCG CAPS capsule Take 1 capsule (290 mcg total) by mouth daily before breakfast.   [DISCONTINUED] lubiprostone  (AMITIZA ) 24 MCG capsule Take 1 capsule (24 mcg total) by  mouth 2 (two) times daily with a meal. Start with 1 daily for 2 days then increase to 2 daily   No facility-administered encounter medications on file as of 07/10/2024.     REVIEW OF SYSTEMS  : All other systems reviewed and negative except where noted in the History of Present Illness.   PHYSICAL EXAM: BP (!) 150/80   Pulse 72   Ht 5' 10 (1.778 m)   Wt (!) 307 lb (139.3 kg)   LMP 08/09/2012   BMI 44.05 kg/m  General: Well developed AA female in no acute distress Head: Normocephalic and atraumatic Eyes:  Sclerae anicteric, conjunctiva pink. Ears: Normal auditory acuity Lungs: Clear throughout to auscultation; no W/R/R. Heart: Regular rate and rhythm; no M/R/G. Abdomen: Soft, non-distended.  BS present.  Non-tender. Musculoskeletal: Symmetrical with no gross deformities  Skin: No lesions on visible extremities Extremities: No edema  Neurological: Alert oriented x 4, grossly non-focal Psychological:  Alert and cooperative. Normal mood and affect  Assessment & Plan Chronic idiopathic constipation Symptoms are well controlled  with Linzess  290 mcg daily; however, ongoing access is threatened by lack of insurance coverage. Previous alternatives were ineffective although she was unable to give exact details as to what occurred with the medications. She is willing to trial alternatives if necessary. - Provided Linzess  290 mcg samples to continue current regimen for approximately two weeks. - Provided samples of Trulance  for a two-week trial once Linzess  supply is exhausted. - Instructed her to update via MyChart at day 10 of the new medication trial regarding efficacy and tolerability. - Will reassess medication options based on response to alternatives and any changes in insurance coverage.  Gastroesophageal reflux disease Symptoms have recently worsened, particularly nocturnally, likely due to dietary changes. She is currently managed with daily pantoprazole . - Recommended adding  over-the-counter famotidine  (Pepcid ) 20 mg at bedtime for additional nocturnal acid suppression. - Advised to use famotidine  nightly or for 1-2 weeks to assess symptom improvement.   CC:  Theotis Haze ORN, NP        [1]  Allergies Allergen Reactions   Latex Other (See Comments)    Infection in left middle finger from latex gloves due to moisture.   Augmentin  [Amoxicillin -Pot Clavulanate] Itching    Burning and itching, back pains, elevated sugar levels   Iohexol       Code: RASH, Desc: PATIENT CALLED 06/17/08-STATED AFTER 6 HOURS POST IV CONTRAST, HANDS,SCALP AND GROIN AREA DEVELOPED ITCHING,BURNING SENSATION, Onset Date: [id number]    "

## 2024-07-10 NOTE — Patient Instructions (Addendum)
 We have given you samples of the following medication to take: Linzess  290 mcg daily before breakfast. Trulance 3 mg daily.   Please purchase the following medications over the counter and take as directed: Start Famotidine  OTC nightly at bedtime.   Call or send Mychart 7-10 days after starting Trulance.  _______________________________________________________  If your blood pressure at your visit was 140/90 or greater, please contact your primary care physician to follow up on this.  _______________________________________________________  If you are age 58 or older, your body mass index should be between 23-30. Your Body mass index is 44.05 kg/m. If this is out of the aforementioned range listed, please consider follow up with your Primary Care Provider.  If you are age 58 or younger, your body mass index should be between 19-25. Your Body mass index is 44.05 kg/m. If this is out of the aformentioned range listed, please consider follow up with your Primary Care Provider.   ________________________________________________________  The  GI providers would like to encourage you to use MYCHART to communicate with providers for non-urgent requests or questions.  Due to long hold times on the telephone, sending your provider a message by [facility] may be a faster and more efficient way to get a response.  Please allow 48 business hours for a response.  Please remember that this is for non-urgent requests.  _______________________________________________________  [facility] is using a team-based approach to care.  Your team is made up of your doctor and two to three APPS. Our APPS (Nurse Practitioners and Physician Assistants) work with your physician to ensure care continuity for you. They are fully qualified to address your health concerns and develop a treatment plan. They communicate directly with your gastroenterologist to care for you. Seeing the Advanced Practice  Practitioners on your physician's team can help you by facilitating care more promptly, often allowing for earlier appointments, access to diagnostic testing, procedures, and other specialty referrals.

## 2024-07-10 NOTE — Progress Notes (Signed)
 Attending Physician's Attestation   I have reviewed the chart.   I agree with the Advanced Practitioner's note, impression, and recommendations with any updates as below.    Corliss Parish, MD [facility] Advanced [facility]oscopy Office # [phone]

## 2024-07-13 ENCOUNTER — Other Ambulatory Visit: Payer: Self-pay

## 2024-07-18 ENCOUNTER — Other Ambulatory Visit: Payer: Self-pay

## 2024-07-19 ENCOUNTER — Encounter: Payer: Self-pay | Admitting: Internal Medicine

## 2024-07-23 ENCOUNTER — Encounter: Payer: Self-pay | Admitting: Nurse Practitioner

## 2024-07-31 ENCOUNTER — Encounter: Payer: Self-pay | Admitting: Hematology

## 2024-07-31 ENCOUNTER — Other Ambulatory Visit: Payer: Self-pay

## 2024-08-02 ENCOUNTER — Other Ambulatory Visit: Payer: Self-pay

## 2024-08-08 ENCOUNTER — Other Ambulatory Visit: Payer: Self-pay

## 2024-08-14 ENCOUNTER — Encounter: Payer: Self-pay | Admitting: Internal Medicine

## 2024-08-14 ENCOUNTER — Other Ambulatory Visit: Payer: Self-pay

## 2024-08-15 ENCOUNTER — Ambulatory Visit: Admitting: Podiatry

## 2024-08-15 ENCOUNTER — Other Ambulatory Visit (HOSPITAL_COMMUNITY): Payer: Self-pay

## 2024-08-15 ENCOUNTER — Other Ambulatory Visit: Payer: Self-pay

## 2024-08-15 ENCOUNTER — Encounter: Payer: Self-pay | Admitting: Hematology

## 2024-08-15 ENCOUNTER — Other Ambulatory Visit: Payer: Self-pay | Admitting: Internal Medicine

## 2024-08-15 DIAGNOSIS — E1142 Type 2 diabetes mellitus with diabetic polyneuropathy: Secondary | ICD-10-CM

## 2024-08-15 MED ORDER — LANTUS SOLOSTAR 100 UNIT/ML ~~LOC~~ SOPN
36.0000 [IU] | PEN_INJECTOR | Freq: Two times a day (BID) | SUBCUTANEOUS | 4 refills | Status: AC
  Filled 2024-08-15: qty 60, 83d supply, fill #0
  Filled 2024-08-17: qty 24, 30d supply, fill #0

## 2024-08-15 MED ORDER — FREESTYLE LIBRE 3 PLUS SENSOR MISC
1.0000 | 3 refills | Status: AC
  Filled 2024-08-15: qty 6, 90d supply, fill #0

## 2024-08-15 MED ORDER — TRULICITY 1.5 MG/0.5ML ~~LOC~~ SOAJ
SUBCUTANEOUS | 0 refills | Status: AC
  Filled 2024-08-15: qty 2, 28d supply, fill #0

## 2024-08-15 NOTE — Telephone Encounter (Signed)
 Her insurance covers Trulicity , it just looks like she has a deductible. Her copay is really $47, but they are applying the rest of her deductible that's left ($242.26) with the $47, so it's making her copay $289.26. Once the deductible is met, it will drop down to $47. Patient has [employer] so she does not qualify for a copay savings card or patient assistance. I also tested Ozempic  and Mounjaro  and they both give the exact same copay, so it looks like they are going to keep applying that remaining deductible to the GLP-1 copays.

## 2024-08-17 ENCOUNTER — Other Ambulatory Visit: Payer: Self-pay

## 2024-08-17 ENCOUNTER — Other Ambulatory Visit (HOSPITAL_COMMUNITY): Payer: Self-pay

## 2024-09-25 ENCOUNTER — Ambulatory Visit: Payer: Self-pay | Admitting: Nurse Practitioner

## 2024-10-04 ENCOUNTER — Ambulatory Visit: Admitting: Internal Medicine

## 2024-11-14 ENCOUNTER — Ambulatory Visit: Admitting: Podiatry

## 2024-11-28 ENCOUNTER — Ambulatory Visit: Admitting: Podiatry
